# Patient Record
Sex: Male | Born: 1970 | Race: White | Hispanic: No | Marital: Single | Smoking: Former smoker
Health system: Southern US, Community
[De-identification: ages and names within clinical notes are randomized; demographics above are authoritative.]

## PROBLEM LIST (undated history)

## (undated) DIAGNOSIS — M419 Scoliosis, unspecified: Secondary | ICD-10-CM

## (undated) DIAGNOSIS — I2510 Atherosclerotic heart disease of native coronary artery without angina pectoris: Secondary | ICD-10-CM

## (undated) DIAGNOSIS — I639 Cerebral infarction, unspecified: Secondary | ICD-10-CM

## (undated) DIAGNOSIS — Z87891 Personal history of nicotine dependence: Secondary | ICD-10-CM

## (undated) DIAGNOSIS — E119 Type 2 diabetes mellitus without complications: Secondary | ICD-10-CM

## (undated) DIAGNOSIS — E785 Hyperlipidemia, unspecified: Secondary | ICD-10-CM

## (undated) HISTORY — DX: Atherosclerotic heart disease of native coronary artery without angina pectoris: I25.10

## (undated) HISTORY — DX: Personal history of nicotine dependence: Z87.891

## (undated) HISTORY — DX: Cerebral infarction, unspecified: I63.9

## (undated) HISTORY — DX: Morbid (severe) obesity due to excess calories: E66.01

## (undated) HISTORY — DX: Hyperlipidemia, unspecified: E78.5

## (undated) HISTORY — PX: CORONARY ANGIOPLASTY: SHX604

## (undated) HISTORY — PX: ADENOIDECTOMY: SUR15

---

## 2008-01-22 ENCOUNTER — Emergency Department: Payer: Self-pay | Admitting: Emergency Medicine

## 2011-07-12 ENCOUNTER — Inpatient Hospital Stay: Payer: Self-pay | Admitting: Specialist

## 2011-07-12 LAB — PROTIME-INR
INR: 0.9
Prothrombin Time: 12.3 secs (ref 11.5–14.7)

## 2011-07-12 LAB — CBC WITH DIFFERENTIAL/PLATELET
Eosinophil #: 0.4 10*3/uL (ref 0.0–0.7)
HCT: 49.2 % (ref 40.0–52.0)
HGB: 16.1 g/dL (ref 13.0–18.0)
Lymphocyte #: 6.4 10*3/uL — ABNORMAL HIGH (ref 1.0–3.6)
Lymphocyte %: 57.7 %
MCV: 94 fL (ref 80–100)
Monocyte #: 0.6 x10 3/mm (ref 0.2–1.0)
Neutrophil #: 3.6 10*3/uL (ref 1.4–6.5)
Neutrophil %: 32.6 %
RBC: 5.23 10*6/uL (ref 4.40–5.90)
WBC: 11 10*3/uL — ABNORMAL HIGH (ref 3.8–10.6)

## 2011-07-12 LAB — ETHANOL: Ethanol %: 0.174 % — ABNORMAL HIGH (ref 0.000–0.080)

## 2012-12-28 ENCOUNTER — Emergency Department: Payer: Self-pay | Admitting: Emergency Medicine

## 2013-07-23 HISTORY — PX: CARDIAC CATHETERIZATION: SHX172

## 2013-08-10 ENCOUNTER — Inpatient Hospital Stay: Payer: Self-pay | Admitting: Internal Medicine

## 2013-08-10 DIAGNOSIS — I517 Cardiomegaly: Secondary | ICD-10-CM

## 2013-08-10 DIAGNOSIS — R7309 Other abnormal glucose: Secondary | ICD-10-CM

## 2013-08-10 DIAGNOSIS — I214 Non-ST elevation (NSTEMI) myocardial infarction: Secondary | ICD-10-CM

## 2013-08-10 LAB — URINALYSIS, COMPLETE
BILIRUBIN, UR: NEGATIVE
Bacteria: NONE SEEN
Blood: NEGATIVE
Glucose,UR: >=500 mg/dL (ref 0–75)
Ketone: NEGATIVE
Leukocyte Esterase: NEGATIVE
NITRITE: NEGATIVE
Ph: 6 (ref 4.5–8.0)
Protein: NEGATIVE
RBC,UR: NONE SEEN /HPF (ref 0–5)
SPECIFIC GRAVITY: 1.016 (ref 1.003–1.030)
SQUAMOUS EPITHELIAL: NONE SEEN
WBC UR: NONE SEEN /HPF (ref 0–5)

## 2013-08-10 LAB — CBC
HCT: 44.6 % (ref 40.0–52.0)
HGB: 15.4 g/dL (ref 13.0–18.0)
MCH: 30.5 pg (ref 26.0–34.0)
MCHC: 34.5 g/dL (ref 32.0–36.0)
MCV: 88 fL (ref 80–100)
PLATELETS: 260 10*3/uL (ref 150–440)
RBC: 5.05 10*6/uL (ref 4.40–5.90)
RDW: 12.8 % (ref 11.5–14.5)
WBC: 9.5 10*3/uL (ref 3.8–10.6)

## 2013-08-10 LAB — HEPATIC FUNCTION PANEL A (ARMC)
ALBUMIN: 4.2 g/dL (ref 3.4–5.0)
AST: 37 U/L (ref 15–37)
Alkaline Phosphatase: 83 U/L
BILIRUBIN TOTAL: 1.2 mg/dL — AB (ref 0.2–1.0)
Bilirubin, Direct: 0.2 mg/dL (ref 0.00–0.20)
SGPT (ALT): 36 U/L (ref 12–78)
TOTAL PROTEIN: 7.8 g/dL (ref 6.4–8.2)

## 2013-08-10 LAB — TROPONIN I
TROPONIN-I: 1.66 ng/mL — AB
Troponin-I: 5.5 ng/mL — ABNORMAL HIGH
Troponin-I: 15 ng/mL — ABNORMAL HIGH

## 2013-08-10 LAB — CK TOTAL AND CKMB (NOT AT ARMC)
CK, Total: 178 U/L
CK-MB: 7.9 ng/mL — ABNORMAL HIGH (ref 0.5–3.6)

## 2013-08-10 LAB — BASIC METABOLIC PANEL
ANION GAP: 7 (ref 7–16)
BUN: 10 mg/dL (ref 7–18)
CHLORIDE: 101 mmol/L (ref 98–107)
Calcium, Total: 8.9 mg/dL (ref 8.5–10.1)
Co2: 30 mmol/L (ref 21–32)
Creatinine: 1.01 mg/dL (ref 0.60–1.30)
EGFR (Non-African Amer.): >60
Glucose: 174 mg/dL — ABNORMAL HIGH (ref 65–99)
Osmolality: 279 (ref 275–301)
Potassium: 3.3 mmol/L — ABNORMAL LOW (ref 3.5–5.1)
Sodium: 138 mmol/L (ref 136–145)

## 2013-08-10 LAB — CK-MB
CK-MB: 34.4 ng/mL — ABNORMAL HIGH (ref 0.5–3.6)
CK-MB: 65.4 ng/mL — ABNORMAL HIGH (ref 0.5–3.6)

## 2013-08-11 ENCOUNTER — Encounter: Payer: Self-pay | Admitting: Cardiovascular Disease

## 2013-08-11 DIAGNOSIS — I2510 Atherosclerotic heart disease of native coronary artery without angina pectoris: Secondary | ICD-10-CM

## 2013-08-11 LAB — HEMOGLOBIN A1C: Hemoglobin A1C: 7.5 % — ABNORMAL HIGH (ref 4.2–6.3)

## 2013-08-11 LAB — CK TOTAL AND CKMB (NOT AT ARMC)
CK, Total: 268 U/L
CK-MB: 11.7 ng/mL — AB (ref 0.5–3.6)

## 2013-08-11 LAB — BASIC METABOLIC PANEL
Anion Gap: 5 — ABNORMAL LOW (ref 7–16)
BUN: 9 mg/dL (ref 7–18)
CALCIUM: 8.7 mg/dL (ref 8.5–10.1)
CHLORIDE: 104 mmol/L (ref 98–107)
CO2: 28 mmol/L (ref 21–32)
Creatinine: 0.95 mg/dL (ref 0.60–1.30)
EGFR (African American): >60
EGFR (Non-African Amer.): >60
GLUCOSE: 146 mg/dL — AB (ref 65–99)
Osmolality: 275 (ref 275–301)
Potassium: 3.9 mmol/L (ref 3.5–5.1)
SODIUM: 137 mmol/L (ref 136–145)

## 2013-08-11 LAB — LIPID PANEL
CHOLESTEROL: 160 mg/dL (ref 0–200)
HDL Cholesterol: 25 mg/dL — ABNORMAL LOW (ref 40–60)
LDL CHOLESTEROL, CALC: 92 mg/dL (ref 0–100)
Triglycerides: 213 mg/dL — ABNORMAL HIGH (ref 0–200)
VLDL Cholesterol, Calc: 43 mg/dL — ABNORMAL HIGH (ref 5–40)

## 2013-08-12 DIAGNOSIS — E785 Hyperlipidemia, unspecified: Secondary | ICD-10-CM

## 2013-08-12 LAB — BASIC METABOLIC PANEL
ANION GAP: 6 — AB (ref 7–16)
BUN: 9 mg/dL (ref 7–18)
CALCIUM: 8.8 mg/dL (ref 8.5–10.1)
CO2: 26 mmol/L (ref 21–32)
Chloride: 104 mmol/L (ref 98–107)
Creatinine: 0.95 mg/dL (ref 0.60–1.30)
EGFR (Non-African Amer.): >60
Glucose: 139 mg/dL — ABNORMAL HIGH (ref 65–99)
Osmolality: 273 (ref 275–301)
Potassium: 3.8 mmol/L (ref 3.5–5.1)
Sodium: 136 mmol/L (ref 136–145)

## 2013-08-13 ENCOUNTER — Telehealth: Payer: Self-pay

## 2013-08-13 NOTE — Telephone Encounter (Signed)
Patient contacted regarding discharge from [facility] on 08/12/13.  Patient understands to follow up with provider Ignacia Bayley, NP on 08/18/13 at 2:15 at [facility]. Patient understands discharge instructions? yes Patient understands medications and regiment? yes Patient understands to bring all medications to this visit? yes  Pt would like a work note to return to work on Sunday. Will leave at front desk for him to pick up today.

## 2013-08-17 ENCOUNTER — Encounter: Payer: Self-pay | Admitting: *Deleted

## 2013-08-18 ENCOUNTER — Encounter (INDEPENDENT_AMBULATORY_CARE_PROVIDER_SITE_OTHER): Payer: Self-pay

## 2013-08-18 ENCOUNTER — Encounter: Payer: Self-pay | Admitting: Nurse Practitioner

## 2013-08-18 ENCOUNTER — Ambulatory Visit (INDEPENDENT_AMBULATORY_CARE_PROVIDER_SITE_OTHER): Payer: PRIVATE HEALTH INSURANCE | Admitting: Nurse Practitioner

## 2013-08-18 VITALS — BP 130/82 | HR 78 | Ht 67.0 in | Wt 213.5 lb

## 2013-08-18 DIAGNOSIS — R079 Chest pain, unspecified: Secondary | ICD-10-CM

## 2013-08-18 DIAGNOSIS — Z87891 Personal history of nicotine dependence: Secondary | ICD-10-CM | POA: Insufficient documentation

## 2013-08-18 DIAGNOSIS — E785 Hyperlipidemia, unspecified: Secondary | ICD-10-CM | POA: Insufficient documentation

## 2013-08-18 DIAGNOSIS — I2510 Atherosclerotic heart disease of native coronary artery without angina pectoris: Secondary | ICD-10-CM | POA: Insufficient documentation

## 2013-08-18 DIAGNOSIS — I10 Essential (primary) hypertension: Secondary | ICD-10-CM

## 2013-08-18 DIAGNOSIS — I214 Non-ST elevation (NSTEMI) myocardial infarction: Secondary | ICD-10-CM

## 2013-08-18 DIAGNOSIS — E119 Type 2 diabetes mellitus without complications: Secondary | ICD-10-CM

## 2013-08-18 MED ORDER — METOPROLOL SUCCINATE ER 50 MG PO TB24
50.0000 mg | ORAL_TABLET | Freq: Every day | ORAL | Status: DC
Start: 2013-08-18 — End: 2017-12-30

## 2013-08-18 NOTE — Patient Instructions (Signed)
Your physician has recommended you make the following change in your medication:  Stop Furosamide Increase Toprol to 50 mg daily    Your physician recommends that you return for lab work in:  Fasting labs (Lipid and Liver Profile) in 6 weeks   Your physician recommends that you schedule a follow-up appointment in:  3 months with Dr. Rockey Situ

## 2013-08-18 NOTE — Progress Notes (Signed)
Patient Name: Christian [name] Date of Encounter: 08/18/2013  Primary Care Provider:  Lavera Guise, MD Primary Cardiologist:  Johnny Bridge, MD   Patient Profile  43 y/o male with recent admission for NSTEMI and DES to the LCX, who presents for f/u.  Problem List   Past Medical History  Diagnosis Date  . CAD (coronary artery disease)     a. 07/2013 NSTEMI/PCI: LCX 30m (4.0x23 Xience DES), RCA 3m, EF > 55%;  b. 07/2013 Echo: EF 60-65%, diast dysfxn, mild LVH, mildly dil LA, nl RV size/fxn, nl RVSP.  Marland Kitchen HTN (hypertension)   . Hyperlipidemia   . Diabetes mellitus, type II   . Morbid obesity     a. weighed 168 @ age 9.  Marland Kitchen History of tobacco abuse     a. quit ~ 2010   Past Surgical History  Procedure Laterality Date  . Adenoidectomy    . Cardiac catheterization  07/2013    [facility]'x1 stent    Allergies  No Known Allergies  HPI  43 y/o male with the above problem list.  He was admitted to [facility] last week after developing stuttering c/p that began after trying to change a light bulb in his oven resulting in him getting shocked/electrocuted.  Pain worsened and he presented to the [facility] ED.  There, ECG was notable for inf q's and lateral twi.  Troponin was elevated.  Pain resolved with ntg.  He was admitted and seen by cardiology.  Echo showed nl LV fxn.  He underwent cath showing severe LCX dzs and this was successfully stented using a xience DES.  He was d/c'd home on asa, plavix, bb, and statin therapy.  Lasix was added on d/c b/c of diast dysfxn on echo, but as far as I can tell, he was never felt to be volume overloaded on exam.  Since d/c, he has been doing well.  He denies chest pain, palpitations, dyspnea, pnd, orthopnea, n, v, dizziness, syncope, edema, weight gain, or early satiety.  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  He only drives and otw does not have to do any heavy lifting or exertion.  He only drives locally and is home each night.  He has been tolerating his meds well  and reports compliance.  Home Medications  Prior to Admission medications   Medication Sig Start Date End Date Taking? Authorizing Provider  aspirin EC 81 MG tablet Take 81 mg by mouth daily.   Yes Historical Provider, MD  clopidogrel (PLAVIX) 75 MG tablet Take 75 mg by mouth daily with breakfast.   Yes Historical Provider, MD  metFORMIN (GLUCOPHAGE) 500 MG tablet Take 500 mg by mouth 2 (two) times daily with a meal.   Yes Historical Provider, MD  simvastatin (ZOCOR) 10 MG tablet Take 10 mg by mouth daily.   Yes Historical Provider, MD  metoprolol succinate (TOPROL-XL) 50 MG 24 hr tablet Take 1 tablet (50 mg total) by mouth daily. 08/18/13   Rogelia Mire, NP    Review of Systems  He denies chest pain, palpitations, dyspnea, pnd, orthopnea, n, v, dizziness, syncope, edema, weight gain, or early satiety.  All other systems reviewed and are otherwise negative except as noted above.  Physical Exam  Blood pressure 130/82, pulse 78, height 5\' 7"  (1.702 m), weight 213 lb 8 oz (96.843 kg).  General: Pleasant, NAD Psych: Normal affect. Neuro: Alert and oriented X 3. Moves all extremities spontaneously. HEENT: Normal  Neck: Supple without bruits  or JVD. Lungs:  Resp regular and unlabored, CTA. Heart: RRR no s3, s4, or murmurs. Abdomen: Soft, non-tender, non-distended, BS + x 4.  Extremities: No clubbing, cyanosis or edema. DP/PT/Radials 2+ and equal bilaterally.  R groin site ecchymotic w/o bleeding/bruit/hematoma.  Accessory Clinical Findings  ECG - RSR, 78, inf q's, twi I/aVL - no acute st/t changes.  Assessment & Plan  1.  NSTEMI subsequent episode of care/CAD:  S/p PCI/DES to the LCX last week.  He has residual non-obstructive RCA dzs.  He reports compliance with meds and is tolerating them well.  He has not had any recurrent c/p or dyspnea.  Cont asa, bb, statin, plavix.  As he has nl LV fxn and did not have any evidence of volume overload in the hospital, I will d/c his lasix.   In so doing, I will titrate toprol xl to 50mg  daily.  As above, he has already returned to work.  We discussed that ideally following a NSTEMI, he should be out for 2-4 wks.  He does not think that he could afford to be out that amount of time and says that he does not exert himself @ work.  We also discussed the role of cardiac rehab and weight loss.  He will consider cardiac rehab and is hoping to lose some weight.  He plans to begin walking daily and we discussed what is an appropriate level of activity @ this time.  He says that he weighed 168 lbs when he was in the service and we set this as a 2 yr goal for him.  2.  HTN:  Stable.  As above, d/c'in lasix.  Will increase toprol to 50.  3.  HL:  Cont statin therapy.  LFT's nl during hospitalization.  We will f/u lipids/lft's in 6 wks.  4.  DM II:  Now on metformin and tolerating well.  He has primary care f/u next week with Dr. Stephanie Coup.  5.  Morbid Obesity:  See # 1 re: exercise and wt loss discussion.  He will consider outpt cardiac rehab.  6.  Dispo:  F/U lipids/lft's in 6 wks.  F/U with Dr. Rockey Situ in 3 mos.   Rogelia Mire, NP 08/18/2013, 3:44 PM

## 2013-09-06 ENCOUNTER — Ambulatory Visit: Payer: Self-pay | Admitting: Internal Medicine

## 2013-09-10 DIAGNOSIS — E785 Hyperlipidemia, unspecified: Secondary | ICD-10-CM

## 2013-09-10 DIAGNOSIS — E119 Type 2 diabetes mellitus without complications: Secondary | ICD-10-CM

## 2013-09-10 DIAGNOSIS — I214 Non-ST elevation (NSTEMI) myocardial infarction: Secondary | ICD-10-CM

## 2013-09-10 DIAGNOSIS — I10 Essential (primary) hypertension: Secondary | ICD-10-CM

## 2013-09-10 DIAGNOSIS — I2510 Atherosclerotic heart disease of native coronary artery without angina pectoris: Secondary | ICD-10-CM

## 2013-09-22 ENCOUNTER — Ambulatory Visit: Payer: Self-pay | Admitting: Internal Medicine

## 2013-09-30 ENCOUNTER — Ambulatory Visit (INDEPENDENT_AMBULATORY_CARE_PROVIDER_SITE_OTHER): Payer: PRIVATE HEALTH INSURANCE | Admitting: *Deleted

## 2013-09-30 DIAGNOSIS — E785 Hyperlipidemia, unspecified: Secondary | ICD-10-CM

## 2013-10-01 ENCOUNTER — Other Ambulatory Visit: Payer: Self-pay

## 2013-10-01 DIAGNOSIS — E785 Hyperlipidemia, unspecified: Secondary | ICD-10-CM

## 2013-10-01 LAB — LIPID PANEL
CHOLESTEROL TOTAL: 180 mg/dL (ref 100–199)
Chol/HDL Ratio: 5.1 ratio units — ABNORMAL HIGH (ref 0.0–5.0)
HDL: 35 mg/dL — ABNORMAL LOW (ref >39)
LDL Calculated: 117 mg/dL — ABNORMAL HIGH (ref 0–99)
TRIGLYCERIDES: 140 mg/dL (ref 0–149)
VLDL Cholesterol Cal: 28 mg/dL (ref 5–40)

## 2013-10-01 LAB — HEPATIC FUNCTION PANEL
ALK PHOS: 75 IU/L (ref 39–117)
ALT: 23 IU/L (ref 0–44)
AST: 18 IU/L (ref 0–40)
Albumin: 4.9 g/dL (ref 3.5–5.5)
Bilirubin, Direct: 0.21 mg/dL (ref 0.00–0.40)
TOTAL PROTEIN: 7.3 g/dL (ref 6.0–8.5)
Total Bilirubin: 0.8 mg/dL (ref 0.0–1.2)

## 2013-10-01 MED ORDER — ATORVASTATIN CALCIUM 80 MG PO TABS: 80.0000 mg | ORAL_TABLET | Freq: Every day | ORAL | Status: DC

## 2013-10-23 ENCOUNTER — Ambulatory Visit: Payer: Self-pay | Admitting: Internal Medicine

## 2013-10-28 ENCOUNTER — Other Ambulatory Visit: Payer: Self-pay | Admitting: *Deleted

## 2013-10-28 MED ORDER — CLOPIDOGREL BISULFATE 75 MG PO TABS: 75.0000 mg | ORAL_TABLET | Freq: Every day | ORAL | Status: DC

## 2013-10-29 ENCOUNTER — Other Ambulatory Visit: Payer: Self-pay

## 2013-10-29 MED ORDER — CLOPIDOGREL BISULFATE 75 MG PO TABS: 75.0000 mg | ORAL_TABLET | Freq: Every day | ORAL | Status: DC

## 2013-10-29 NOTE — Telephone Encounter (Signed)
Refill sent for plavix  

## 2013-10-29 NOTE — Addendum Note (Signed)
Addended by: Anselm Pancoast on: 10/29/2013 12:19 PM   Modules accepted: Orders

## 2013-11-18 ENCOUNTER — Ambulatory Visit: Payer: PRIVATE HEALTH INSURANCE | Admitting: Cardiovascular Disease

## 2013-11-23 ENCOUNTER — Ambulatory Visit: Payer: Self-pay | Admitting: Internal Medicine

## 2013-12-03 ENCOUNTER — Ambulatory Visit: Payer: PRIVATE HEALTH INSURANCE | Admitting: Cardiovascular Disease

## 2013-12-08 ENCOUNTER — Telehealth: Payer: Self-pay

## 2013-12-08 ENCOUNTER — Encounter: Payer: Self-pay | Admitting: Nurse Practitioner

## 2013-12-08 NOTE — Telephone Encounter (Signed)
Letter left at front desk for pt to pick up at his convenience.

## 2013-12-08 NOTE — Telephone Encounter (Signed)
1. What type of work will he be doing?  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  2.  What is the dental procedure?  He may not come off of asa/plavix but more than likely would be ok to have routine dental procedures.

## 2013-12-08 NOTE — Telephone Encounter (Signed)
Pt needs clearance for dental work. Please call. Also needs a letter stating it is ok for him to work . Pt is starting new job.

## 2013-12-08 NOTE — Telephone Encounter (Signed)
Spoke w/ pt.  He states that his company is [employer] and he has been reassigned. 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  He has been cleared by PCP, as his HgbA1c is now 6.3 after losing 18lbs. He would like to pick a letter tomorrow stating that he is cleared to do this type of work w/ his stent.   Pt reports that he is having his teeth cleaned.   Advised him of Chris's recommendation and he verbalizes understanding.

## 2013-12-15 ENCOUNTER — Ambulatory Visit (INDEPENDENT_AMBULATORY_CARE_PROVIDER_SITE_OTHER): Payer: PRIVATE HEALTH INSURANCE | Admitting: Physician Assistant

## 2013-12-15 ENCOUNTER — Encounter: Payer: Self-pay | Admitting: Physician Assistant

## 2013-12-15 VITALS — BP 120/84 | HR 72 | Ht 67.0 in | Wt 199.8 lb

## 2013-12-15 DIAGNOSIS — I252 Old myocardial infarction: Secondary | ICD-10-CM

## 2013-12-15 DIAGNOSIS — Z7689 Persons encountering health services in other specified circumstances: Secondary | ICD-10-CM

## 2013-12-15 DIAGNOSIS — Z0189 Encounter for other specified special examinations: Secondary | ICD-10-CM

## 2013-12-15 DIAGNOSIS — Z9889 Other specified postprocedural states: Secondary | ICD-10-CM

## 2013-12-15 DIAGNOSIS — I10 Essential (primary) hypertension: Secondary | ICD-10-CM

## 2013-12-15 NOTE — Patient Instructions (Signed)
[employer]  Your caregiver has ordered a Stress Test with nuclear imaging. The purpose of this test is to evaluate the blood supply to your heart muscle. This procedure is referred to as a "Non-Invasive Stress Test." This is because other than having an IV started in your vein, nothing is inserted or "invades" your body. Cardiac stress tests are done to find areas of poor blood flow to the heart by determining the extent of coronary artery disease (CAD). Some patients exercise on a treadmill, which naturally increases the blood flow to your heart, while others who are  unable to walk on a treadmill due to physical limitations have a pharmacologic/chemical stress agent called Lexiscan . This medicine will mimic walking on a treadmill by temporarily increasing your coronary blood flow.   Please note: these test may take anywhere between 2-4 hours to complete  PLEASE REPORT TO [facility] AT THE FIRST DESK WILL DIRECT YOU WHERE TO GO  Date of Procedure:________9/28/15_____________________________  Arrival Time for Procedure:_______0745 am_______________________  Instructions regarding medication:   _x___ : Hold diabetes medication morning of procedure  _x___:  Hold betablocker(s) night before procedure and morning of procedure:            Metoprolol    PLEASE NOTIFY THE OFFICE AT LEAST 24 HOURS IN ADVANCE IF YOU ARE UNABLE TO KEEP YOUR APPOINTMENT.  [phone] AND  PLEASE NOTIFY NUCLEAR MEDICINE AT [facility] AT LEAST 24 HOURS IN ADVANCE IF YOU ARE UNABLE TO KEEP YOUR APPOINTMENT. [phone]  How to prepare for your Myoview test:  1. Do not eat or drink after midnight 2. No caffeine for 24 hours prior to test 3. No smoking 24 hours prior to test. 4. Your medication may be taken with water.  If your doctor stopped a medication because of this test, do not take that medication. 5. Ladies, please do not wear dresses.  Skirts or pants are appropriate. Please  wear a short sleeve shirt. 6. No perfume, cologne or lotion. 7. Wear comfortable walking shoes. No heels!  Your physician has requested that you have an echocardiogram. Echocardiography is a painless test that uses sound waves to create images of your heart. It provides your doctor with information about the size and shape of your heart and how well your heart's chambers and valves are working. This procedure takes approximately one hour. There are no restrictions for this procedure.  Your physician recommends that you return for lab work in:  Liver Panel   Your physician wants you to follow-up in: 6 months. You will receive a reminder letter in the mail two months in advance. If you don't receive a letter, please call our office to schedule the follow-up appointment.

## 2013-12-15 NOTE — Progress Notes (Signed)
Patient Name: Christian [name], Christian [name] [birth year], MRN [id number]  Date of Encounter: 12/15/2013  Primary Care Provider:  Lavera Guise, MD Primary Cardiologist:  Dr. Rockey Situ, MD  Patient Profile:  43 y.o. male with history of CAD s/p PCI/DES to LCx (07/2013), HTN, HLD, DM2, morbid obesity, & h/o tobacco abuse who presents for routine office follow up and is doing well.     Problem List:   Past Medical History  Diagnosis Date  . CAD (coronary artery disease)     a. 07/2013 NSTEMI/PCI: LCX 44m (4.0x23 Xience DES), RCA 6m, EF > 55%;  b. 07/2013 Echo: EF 60-65%, diast dysfxn, mild LVH, mildly dil LA, nl RV size/fxn, nl RVSP.  Marland Kitchen HTN (hypertension)   . Hyperlipidemia   . Diabetes mellitus, type II   . Morbid obesity     a. weighed 168 @ age 64.  Marland Kitchen History of tobacco abuse     a. quit ~ 2010   Past Surgical History  Procedure Laterality Date  . Adenoidectomy    . Cardiac catheterization  07/2013    [facility]'x1 stent     Allergies:  No Known Allergies   HPI:  43 y.o. male with the above problem list here for routine follow who is doing well.   Patient was admitted to [facility] May 2015 with stuttering chest pain that began after he was changing a light bulb in his oven resulting in him getting shocked/electrocuted. His pain continued to worsened prompting him to present to [facility] ED. At that time EKG was notable for inferior Q waves and lateral TWI. Troponin was elevated. Nl LV fxn by echo. Cardiac cath revealed severe LCx disease which was successfully treated using Xience DES. He was discharged on aspirin, bb, & statin. Lasix was 2/2 diastolic dysfunction; however this was discontinued at his post hospital follow up on 08/18/13 as he did not feel volume over loaded, nor did he appear to be volume over loaded. To compensate for the discontinuation of the Lasix his bb was titrated.    Since his last OV he reports he is doing well. He is taking his medications as directed and tolerating them  without issues. He was previously walking the track daily for at least 30 minutes for exercise but had to decrease this to 2-3 times per week as his mother had to go to PT and he was needed for that. His mother's PT has since ended and he plans to start back his track exercise program again and work up to daily. He is trying to eat healthy. His last Hgb A1C was 6.3% on 12/03/13 which is down from 7.5% previously. He denies any chest pain, palpitations, diaphoresis, nausea, vomiting, SOB, DOE, presyncope, or syncope.   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 Because of this he will need to have an ETT Myvoew and a post MI echo per [facility] guidelines before his new DOT card can be issued.    Home Medications:  Prior to Admission medications   Medication Sig Start Date End Date Taking? Authorizing Provider  aspirin EC 81 MG tablet Take 81 mg by mouth daily.   Yes Historical Provider, MD  atorvastatin (LIPITOR) 80 MG tablet Take 1 tablet (80 mg total) by mouth daily. 10/01/13  Yes Rogelia Mire, NP  clopidogrel (PLAVIX) 75 MG tablet Take 1 tablet (75 mg total) by mouth daily with breakfast. 10/29/13  Yes Minna Merritts, MD  metFORMIN (GLUCOPHAGE)  500 MG tablet Take 500 mg by mouth 2 (two) times daily with a meal.   Yes Historical Provider, MD  metoprolol succinate (TOPROL-XL) 50 MG 24 hr tablet Take 1 tablet (50 mg total) by mouth daily. 08/18/13  Yes Rogelia Mire, NP     Weights: Filed Weights   12/15/13 1415  Weight: 199 lb 12 oz (90.606 kg)     Review of Systems:  All other systems reviewed and are otherwise negative except as noted above.  Physical Exam:  Blood pressure 120/84, pulse 72, height 5\' 7"  (1.702 m), weight 199 lb 12 oz (90.606 kg).  General: Pleasant, NAD Psych: Normal affect. Neuro: Alert and oriented X 3. Moves all extremities spontaneously. HEENT: Normal  Neck: Supple without bruits or JVD. Lungs:  Resp regular and unlabored, CTA. Heart:  RRR no s3, s4, or murmurs. Abdomen: Soft, non-tender, non-distended, BS + x 4.  Extremities: No clubbing, cyanosis or edema. DP/PT/Radials 2+ and equal bilaterally.   Accessory Clinical Findings:  EKG - NSR, 72, inferior Q waves, TWI in I an aVL has improved  Assessment & Plan:  1. CAD s/p PCI/DES to LCx: -Doing well - asymptomatic  -Continue aspirin 81 mg daily and Plavix 75 mg daily at least 12 months -Needs ETT Myoview and echo for new DOT card. Per new [employer] guidelines: 1) He must be asymptomatic, 2) Adequate ETT Myoview, 3) echo with EF of at least 40%, 4) repeat ETT Myoview every 2 years, 5) DOT card will be valid for 1 year   -Continue Toprol XL 50 mg and Lipitor 80 mg  2. HTN: -Controlled -Continue Toprol XL 50 mg daily   3. HLD: -Now on Lipitor 80 mg as of 09/30/13 -Goal LDL <70 -Check LFTs   4. DM2: -A1C improved from 7.5% to 6.3% -Followed by PCP  5. Morbid obesity: -Healthy diet and exercise  6. Dispo: -Follow up LFTs -Follow up ETT Myoview - for DOT card -Follow up echo - for DOT card -Follow up with Dr. Rockey Situ 6 months  Christell Faith, PA-C [place], [place] [address] [phone] [facility] 12/15/2013, 4:00 PM

## 2013-12-16 ENCOUNTER — Other Ambulatory Visit: Payer: Self-pay | Admitting: Physician Assistant

## 2013-12-16 DIAGNOSIS — E785 Hyperlipidemia, unspecified: Secondary | ICD-10-CM

## 2013-12-16 LAB — HEPATIC FUNCTION PANEL
ALK PHOS: 95 IU/L (ref 39–117)
ALT: 27 IU/L (ref 0–44)
AST: 19 IU/L (ref 0–40)
Albumin: 4.7 g/dL (ref 3.5–5.5)
BILIRUBIN DIRECT: 0.32 mg/dL (ref 0.00–0.40)
Total Bilirubin: 1.2 mg/dL (ref 0.0–1.2)
Total Protein: 7 g/dL (ref 6.0–8.5)

## 2013-12-17 ENCOUNTER — Telehealth: Payer: Self-pay

## 2013-12-17 DIAGNOSIS — I214 Non-ST elevation (NSTEMI) myocardial infarction: Secondary | ICD-10-CM

## 2013-12-17 NOTE — Telephone Encounter (Signed)
Spoke w/ pt.  Advised him that ins would not cover his Brantley Fling that is sched for Monday and that I have cancelled this.  Pt is now sched for GXT 12/23/13 @ 2:00 followed by an ECHO @ 2:30.  He verbalizes understanding and is agreeable.

## 2013-12-17 NOTE — Progress Notes (Signed)
No answer no vm 9/25

## 2013-12-23 ENCOUNTER — Ambulatory Visit (INDEPENDENT_AMBULATORY_CARE_PROVIDER_SITE_OTHER): Payer: PRIVATE HEALTH INSURANCE | Admitting: Physician Assistant

## 2013-12-23 ENCOUNTER — Ambulatory Visit: Payer: Self-pay | Admitting: Internal Medicine

## 2013-12-23 ENCOUNTER — Ambulatory Visit (INDEPENDENT_AMBULATORY_CARE_PROVIDER_SITE_OTHER): Payer: PRIVATE HEALTH INSURANCE | Admitting: *Deleted

## 2013-12-23 VITALS — BP 127/86 | HR 73 | Ht 67.0 in | Wt 199.8 lb

## 2013-12-23 DIAGNOSIS — Z9889 Other specified postprocedural states: Secondary | ICD-10-CM

## 2013-12-23 DIAGNOSIS — Z7689 Persons encountering health services in other specified circumstances: Secondary | ICD-10-CM

## 2013-12-23 DIAGNOSIS — I252 Old myocardial infarction: Secondary | ICD-10-CM

## 2013-12-23 DIAGNOSIS — I214 Non-ST elevation (NSTEMI) myocardial infarction: Secondary | ICD-10-CM

## 2013-12-23 DIAGNOSIS — I10 Essential (primary) hypertension: Secondary | ICD-10-CM

## 2013-12-23 NOTE — Progress Notes (Signed)
    Patient came in for treadmill stress test and tolerated well without any symptoms.  Resting BP 127/86, pulse 73 Max HR: 166, 94% MPBPM Max BP 158/74 11.70 METS  No significant EKG changes of ischemia.   EKG reviewed with Dr. Rockey Situ, MD as well.    Christell Faith, PA-C 12/23/2013 4:40 PM

## 2013-12-23 NOTE — Procedures (Signed)
Exercise Treadmill Test  Pre-Exercise Testing Evaluation Rhythm: normal sinus  Rate: 73  PR:   QRS:    QT:   QTc:   P axis:   QRS axis:    ST Segments:  no significant ST changes at rest     Test  Exercise Tolerance Test Ordering MD: Christell Faith, PA-C  Interpreting MD: Christell Faith, PA-C  Unique Test No:  Treadmill:  1  Indication for ETT: post angioplasty  Contraindication to ETT: No   Stress Modality: exercise - treadmill  Cardiac Imaging Performed: non   Protocol: standard Bruce - maximal  Max BP:  158/74  Max MPHR (bpm):  94% 85% MPR (bpm):  176  MPHR obtained (bpm):  166 % MPHR obtained:  94%  Reached 85% MPHR (min:sec):  8:50 Total Exercise Time (min-sec):  10:01  Workload in METS:  11.70 Borg Scale:   Reason ETT Terminated:  fatigue    ST Segment Analysis At Rest: normal ST segments - no evidence of significant ST depression With Exercise: no evidence of significant ST depression  Other Information Arrhythmia:  No Angina during ETT:  absent (0) Quality of ETT:  diagnostic  ETT Interpretation:  normal - no evidence of ischemia by ST analysis  Comments: No evidence of significant EKG changes concerning for ischemia.    Recommendations: Continue aggressive medical therapy, lifestyle modification, and risk factor reduction.

## 2013-12-23 NOTE — Progress Notes (Signed)
    Patient tolerated treadmill stress test well and did not develop any anginal symptoms.   Resting BP 127/86 Resting pulse 75  Max HR 166 %MPBPM 94% Max BP 158/74 11.70 METS  No significant EKG changes concerning for ischemia.    EKG reviewed by Dr. Rockey Situ, MD as well.    Christell Faith, PA-C  12/23/2013 4:46 PM

## 2013-12-23 NOTE — Patient Instructions (Signed)
Your stress test was normal.  Call or return to clinic prn if these symptoms worsen or fail to improve as anticipated.

## 2013-12-24 ENCOUNTER — Telehealth: Payer: Self-pay | Admitting: Physician Assistant

## 2013-12-24 NOTE — Telephone Encounter (Signed)
See patient letter
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
## 2017-12-30 ENCOUNTER — Other Ambulatory Visit: Payer: Self-pay

## 2017-12-30 ENCOUNTER — Observation Stay
Admission: EM | Admit: 2017-12-30 | Discharge: 2018-01-01 | Disposition: A | Discharge status: Home / Self Care | Payer: Self-pay | Attending: Internal Medicine | Admitting: Internal Medicine

## 2017-12-30 ENCOUNTER — Emergency Department: Payer: Self-pay

## 2017-12-30 DIAGNOSIS — R000 Tachycardia, unspecified: Secondary | ICD-10-CM | POA: Insufficient documentation

## 2017-12-30 DIAGNOSIS — K760 Fatty (change of) liver, not elsewhere classified: Secondary | ICD-10-CM | POA: Insufficient documentation

## 2017-12-30 DIAGNOSIS — Z7982 Long term (current) use of aspirin: Secondary | ICD-10-CM | POA: Insufficient documentation

## 2017-12-30 DIAGNOSIS — I208 Other forms of angina pectoris: Secondary | ICD-10-CM

## 2017-12-30 DIAGNOSIS — I2510 Atherosclerotic heart disease of native coronary artery without angina pectoris: Secondary | ICD-10-CM | POA: Insufficient documentation

## 2017-12-30 DIAGNOSIS — R0789 Other chest pain: Principal | ICD-10-CM | POA: Insufficient documentation

## 2017-12-30 DIAGNOSIS — Z9114 Patient's other noncompliance with medication regimen: Secondary | ICD-10-CM | POA: Insufficient documentation

## 2017-12-30 DIAGNOSIS — Z87891 Personal history of nicotine dependence: Secondary | ICD-10-CM | POA: Insufficient documentation

## 2017-12-30 DIAGNOSIS — I160 Hypertensive urgency: Secondary | ICD-10-CM | POA: Insufficient documentation

## 2017-12-30 DIAGNOSIS — E1165 Type 2 diabetes mellitus with hyperglycemia: Secondary | ICD-10-CM | POA: Insufficient documentation

## 2017-12-30 DIAGNOSIS — Z8249 Family history of ischemic heart disease and other diseases of the circulatory system: Secondary | ICD-10-CM | POA: Insufficient documentation

## 2017-12-30 DIAGNOSIS — N281 Cyst of kidney, acquired: Secondary | ICD-10-CM | POA: Insufficient documentation

## 2017-12-30 DIAGNOSIS — Z7984 Long term (current) use of oral hypoglycemic drugs: Secondary | ICD-10-CM | POA: Insufficient documentation

## 2017-12-30 DIAGNOSIS — I10 Essential (primary) hypertension: Secondary | ICD-10-CM | POA: Insufficient documentation

## 2017-12-30 DIAGNOSIS — Z79899 Other long term (current) drug therapy: Secondary | ICD-10-CM | POA: Insufficient documentation

## 2017-12-30 DIAGNOSIS — E7800 Pure hypercholesterolemia, unspecified: Secondary | ICD-10-CM | POA: Insufficient documentation

## 2017-12-30 DIAGNOSIS — Z955 Presence of coronary angioplasty implant and graft: Secondary | ICD-10-CM | POA: Insufficient documentation

## 2017-12-30 DIAGNOSIS — K4090 Unilateral inguinal hernia, without obstruction or gangrene, not specified as recurrent: Secondary | ICD-10-CM | POA: Insufficient documentation

## 2017-12-30 DIAGNOSIS — E785 Hyperlipidemia, unspecified: Secondary | ICD-10-CM | POA: Insufficient documentation

## 2017-12-30 DIAGNOSIS — I252 Old myocardial infarction: Secondary | ICD-10-CM | POA: Insufficient documentation

## 2017-12-30 DIAGNOSIS — F1010 Alcohol abuse, uncomplicated: Secondary | ICD-10-CM | POA: Insufficient documentation

## 2017-12-30 DIAGNOSIS — E876 Hypokalemia: Secondary | ICD-10-CM | POA: Insufficient documentation

## 2017-12-30 DIAGNOSIS — R079 Chest pain, unspecified: Secondary | ICD-10-CM

## 2017-12-30 LAB — COMPREHENSIVE METABOLIC PANEL
ALBUMIN: 4.3 g/dL (ref 3.5–5.0)
ALT: 16 U/L (ref 0–44)
AST: 15 U/L (ref 15–41)
Alkaline Phosphatase: 62 U/L (ref 38–126)
Anion gap: 12 (ref 5–15)
BUN: 6 mg/dL (ref 6–20)
CHLORIDE: 93 mmol/L — AB (ref 98–111)
CO2: 30 mmol/L (ref 22–32)
Calcium: 9.2 mg/dL (ref 8.9–10.3)
Creatinine, Ser: 0.6 mg/dL — ABNORMAL LOW (ref 0.61–1.24)
GFR calc non Af Amer: >60 mL/min (ref >60)
GLUCOSE: 285 mg/dL — AB (ref 70–99)
POTASSIUM: 3.4 mmol/L — AB (ref 3.5–5.1)
SODIUM: 135 mmol/L (ref 135–145)
Total Bilirubin: 1.4 mg/dL — ABNORMAL HIGH (ref 0.3–1.2)
Total Protein: 7.3 g/dL (ref 6.5–8.1)

## 2017-12-30 LAB — APTT: APTT: 29 s (ref 24–36)

## 2017-12-30 LAB — CBC
HCT: 42.3 % (ref 40.0–52.0)
HEMOGLOBIN: 15.1 g/dL (ref 13.0–18.0)
MCH: 33.1 pg (ref 26.0–34.0)
MCHC: 35.6 g/dL (ref 32.0–36.0)
MCV: 92.9 fL (ref 80.0–100.0)
PLATELETS: 193 10*3/uL (ref 150–440)
RBC: 4.56 MIL/uL (ref 4.40–5.90)
RDW: 13 % (ref 11.5–14.5)
WBC: 4.3 10*3/uL (ref 3.8–10.6)

## 2017-12-30 LAB — TROPONIN I
TROPONIN I: 0.03 ng/mL — AB (ref <0.03)
TROPONIN I: 0.03 ng/mL — AB (ref <0.03)
Troponin I: <0.03 ng/mL (ref <0.03)

## 2017-12-30 LAB — HEMOGLOBIN A1C
Hgb A1c MFr Bld: 10 % — ABNORMAL HIGH (ref 4.8–5.6)
Mean Plasma Glucose: 240.3 mg/dL

## 2017-12-30 LAB — GLUCOSE, CAPILLARY
Glucose-Capillary: 269 mg/dL — ABNORMAL HIGH (ref 70–99)
Glucose-Capillary: 284 mg/dL — ABNORMAL HIGH (ref 70–99)

## 2017-12-30 LAB — PROTIME-INR
INR: 0.9
PROTHROMBIN TIME: 12.1 s (ref 11.4–15.2)

## 2017-12-30 MED ORDER — ACETAMINOPHEN 325 MG PO TABS: 650.0000 mg | ORAL_TABLET | Freq: Four times a day (QID) | ORAL | Status: DC | PRN

## 2017-12-30 MED ORDER — ASPIRIN 81 MG PO CHEW
81.0000 mg | CHEWABLE_TABLET | Freq: Every day | ORAL | Status: DC
  Administered 2017-12-30 – 2018-01-01 (×3): 81 mg via ORAL
  Filled 2017-12-30 (×3): qty 1

## 2017-12-30 MED ORDER — SODIUM CHLORIDE 0.9 % IV SOLN
INTRAVENOUS | Status: DC
  Administered 2017-12-30 – 2017-12-31 (×2): via INTRAVENOUS

## 2017-12-30 MED ORDER — METOPROLOL TARTRATE 50 MG PO TABS
50.0000 mg | ORAL_TABLET | Freq: Two times a day (BID) | ORAL | Status: DC
  Administered 2017-12-30 (×2): 50 mg via ORAL
  Filled 2017-12-30 (×2): qty 1

## 2017-12-30 MED ORDER — NITROGLYCERIN 2 % TD OINT
0.5000 [in_us] | TOPICAL_OINTMENT | Freq: Four times a day (QID) | TRANSDERMAL | Status: DC
  Administered 2017-12-30 – 2018-01-01 (×8): 0.5 [in_us] via TOPICAL
  Filled 2017-12-30 (×7): qty 1

## 2017-12-30 MED ORDER — BISACODYL 5 MG PO TBEC
5.0000 mg | DELAYED_RELEASE_TABLET | Freq: Every day | ORAL | Status: DC | PRN
  Filled 2017-12-30: qty 1

## 2017-12-30 MED ORDER — ACETAMINOPHEN 650 MG RE SUPP: 650.0000 mg | Freq: Four times a day (QID) | RECTAL | Status: DC | PRN

## 2017-12-30 MED ORDER — IOPAMIDOL (ISOVUE-370) INJECTION 76%
100.0000 mL | Freq: Once | INTRAVENOUS | Status: AC | PRN
  Administered 2017-12-30: 100 mL via INTRAVENOUS

## 2017-12-30 MED ORDER — ONDANSETRON HCL 4 MG/2ML IJ SOLN: 4.0000 mg | Freq: Four times a day (QID) | INTRAMUSCULAR | Status: DC | PRN

## 2017-12-30 MED ORDER — ONDANSETRON HCL 4 MG PO TABS: 4.0000 mg | ORAL_TABLET | Freq: Four times a day (QID) | ORAL | Status: DC | PRN

## 2017-12-30 MED ORDER — HYDRALAZINE HCL 20 MG/ML IJ SOLN: 10.0000 mg | Freq: Four times a day (QID) | INTRAMUSCULAR | Status: DC | PRN

## 2017-12-30 MED ORDER — ROSUVASTATIN CALCIUM 10 MG PO TABS
10.0000 mg | ORAL_TABLET | Freq: Every day | ORAL | Status: DC
  Administered 2017-12-30 – 2018-01-01 (×3): 10 mg via ORAL
  Filled 2017-12-30 (×3): qty 1

## 2017-12-30 MED ORDER — ENOXAPARIN SODIUM 40 MG/0.4ML ~~LOC~~ SOLN
40.0000 mg | SUBCUTANEOUS | Status: DC
  Administered 2017-12-30 – 2017-12-31 (×2): 40 mg via SUBCUTANEOUS
  Filled 2017-12-30 (×2): qty 0.4

## 2017-12-30 MED ORDER — DOCUSATE SODIUM 100 MG PO CAPS
100.0000 mg | ORAL_CAPSULE | Freq: Two times a day (BID) | ORAL | Status: DC
  Administered 2017-12-30 – 2018-01-01 (×3): 100 mg via ORAL
  Filled 2017-12-30 (×4): qty 1

## 2017-12-30 MED ORDER — HYDROCODONE-ACETAMINOPHEN 5-325 MG PO TABS
1.0000 | ORAL_TABLET | ORAL | Status: DC | PRN
  Administered 2018-01-01: 1 via ORAL
  Filled 2017-12-30: qty 1

## 2017-12-30 MED ORDER — INSULIN ASPART 100 UNIT/ML ~~LOC~~ SOLN
0.0000 [IU] | Freq: Three times a day (TID) | SUBCUTANEOUS | Status: DC
  Administered 2017-12-30: 8 [IU] via SUBCUTANEOUS
  Administered 2017-12-31: 11 [IU] via SUBCUTANEOUS
  Administered 2017-12-31: 8 [IU] via SUBCUTANEOUS
  Administered 2017-12-31: 5 [IU] via SUBCUTANEOUS
  Administered 2018-01-01: 8 [IU] via SUBCUTANEOUS
  Filled 2017-12-30 (×5): qty 1

## 2017-12-30 MED ORDER — LABETALOL HCL 5 MG/ML IV SOLN
10.0000 mg | Freq: Once | INTRAVENOUS | Status: AC
  Administered 2017-12-30: 10 mg via INTRAVENOUS
  Filled 2017-12-30: qty 4

## 2017-12-30 NOTE — ED Notes (Signed)
PT resting in bed, NAD noted at this time. Pt denies any complaints/needs at this time.

## 2017-12-30 NOTE — ED Triage Notes (Signed)
Pt c/o intermittent chest pain/tightness that radiates into the back for the past 3-4 days. Denies SOB, N/V or diaphoresis.

## 2017-12-30 NOTE — ED Provider Notes (Signed)
[facility] Emergency Department Provider Note   ____________________________________________    I have reviewed the triage vital signs and the nursing notes.   HISTORY  Chief Complaint Chest Pain     HPI Christian [name] is a 47 y.o. male with a history of coronary artery disease status post stent placement in 2015, history of diabetes and hypertension who presents today with chest pain.  Patient reports overnight he felt like someone was pushing an ice pick into his chest for separate times.  Currently he has no pain.  He also reports 2 days ago he had a knot in his upper back which he states was unusual.  Denies shortness of breath.  No pleurisy.  No calf pain or swelling.  Has not taken anything for this.   Past Medical History:  Diagnosis Date  . CAD (coronary artery disease)    a. 07/2013 NSTEMI/PCI: LCX 49m (4.0x23 Xience DES), RCA 22m, EF > 55%;  b. 07/2013 Echo: EF 60-65%, diast dysfxn, mild LVH, mildly dil LA, nl RV size/fxn, nl RVSP.  Marland Kitchen History of tobacco abuse    a. quit ~ 2010  . Hyperlipidemia   . Morbid obesity ([place])    a. weighed 168 @ age 41.    Patient Active Problem List   Diagnosis Date Noted  . CAD (coronary artery disease)   . HTN (hypertension)   . Hyperlipidemia   . Diabetes mellitus, type II ([place])   . Morbid obesity ([place])   . History of tobacco abuse     Past Surgical History:  Procedure Laterality Date  . ADENOIDECTOMY    . CARDIAC CATHETERIZATION  07/2013   [facility]'x1 stent    Prior to Admission medications   Medication Sig Start Date End Date Taking? Authorizing Provider  diphenhydrAMINE HCl (THERAFLU MULTI SYMPTOM PO) Take by mouth as directed.   Yes [provider]     Allergies Patient has no known allergies.  Family History  Problem Relation Age of Onset  . Heart disease Mother   . Esophageal cancer Father     Social History Social History   Tobacco Use  . Smoking status: Former Smoker   Packs/day: 0.50    Years: 20.00    Pack years: 10.00    Types: Cigarettes  Substance Use Topics  . Alcohol use: No  . Drug use: No    Types: Marijuana    Comment: past    Review of Systems  Constitutional: No fever/chills Eyes: No visual changes.  ENT: No sore throat. Cardiovascular: As above Respiratory: Denies shortness of breath. Gastrointestinal: No abdominal pain.  No nausea, no vomiting.   Genitourinary: Negative for dysuria. Musculoskeletal: As above Skin: Negative for rash. Neurological: Negative for headaches   ____________________________________________   PHYSICAL EXAM:  VITAL SIGNS: ED Triage Vitals  Enc Vitals Group     BP 12/30/17 0801 (!) 212/115     Pulse Rate 12/30/17 0801 (!) 109     Resp 12/30/17 0801 18     Temp 12/30/17 0802 98.7 F (37.1 C)     Temp Source 12/30/17 0801 Oral     SpO2 12/30/17 0801 99 %     Weight 12/30/17 0800 81.6 kg (180 lb)     Height 12/30/17 0800 1.676 m (5\' 6" )     Head Circumference --      Peak Flow --      Pain Score 12/30/17 0800 3     Pain Loc --  Pain Edu? --      Excl. in [place]? --     Constitutional: Alert and oriented.  Eyes: Conjunctivae are normal.   Nose: No congestion/rhinnorhea. Mouth/Throat: Mucous membranes are moist.    Cardiovascular: Normal rate, regular rhythm. Grossly normal heart sounds.  Good peripheral circulation.  No rash Respiratory: Normal respiratory effort.  No retractions. Lungs CTAB. Gastrointestinal: Soft and nontender. No distention.  No CVA tenderness.  Musculoskeletal: No lower extremity tenderness nor edema.  Warm and well perfused.  No muscle spasm or tenderness to palpation of the upper back, no vertebral tenderness to palpation. Neurologic:  Normal speech and language. No gross focal neurologic deficits are appreciated.  Skin:  Skin is warm, dry and intact. No rash noted. Psychiatric: Mood and affect are normal. Speech and behavior are  normal.  ____________________________________________   LABS (all labs ordered are listed, but only abnormal results are displayed)  Labs Reviewed  COMPREHENSIVE METABOLIC PANEL - Abnormal; Notable for the following components:      Result Value   Potassium 3.4 (*)    Chloride 93 (*)    Glucose, Bld 285 (*)    Creatinine, Ser 0.60 (*)    Total Bilirubin 1.4 (*)    All other components within normal limits  CBC  TROPONIN I  APTT  PROTIME-INR   ____________________________________________  EKG  ED ECG REPORT I, Lavonia Drafts, the attending physician, personally viewed and interpreted this ECG.  Date: 12/30/2017  Rhythm: Tachycardia QRS Axis: normal Intervals: normal ST/T Wave abnormalities: Nonspecific changes   ____________________________________________  RADIOLOGY  Chest x-ray ____________________________________________   PROCEDURES  Procedure(s) performed: No  Procedures   Critical Care performed: No ____________________________________________   INITIAL IMPRESSION / ASSESSMENT AND PLAN / ED COURSE  Pertinent labs & imaging results that were available during my care of the patient were reviewed by me and considered in my medical decision making (see chart for details).  Patient presents with chest pain as detailed above, also reports of intermittent upper back discomfort, this did not appear to be occurring at the same time however given tachycardia, significant hypertension, history of CAD certainly there is concern about ACS, possibly even dissection.  Pending labs, chest x-ray.  Patient is pain-free at this time,  CT angiography negative for dissection.  Given high risk chest pain I will admit for further evaluation    ____________________________________________   FINAL CLINICAL IMPRESSION(S) / ED DIAGNOSES  Final diagnoses:  Chest pain, unspecified type  Essential hypertension        Note:  This document was prepared using Dragon  voice recognition software and may include unintentional dictation errors.    Lavonia Drafts, MD 12/30/17 1102

## 2017-12-30 NOTE — Progress Notes (Signed)
Inpatient Diabetes Program Recommendations  AACE/ADA: New Consensus Statement on Inpatient Glycemic Control (2015)  Target Ranges:  Prepandial:   less than 140 mg/dL      Peak postprandial:   less than 180 mg/dL (1-2 hours)      Critically ill patients:  140 - 180 mg/dL   Lab Results  Component Value Date   HGBA1C 7.5 (H) 08/11/2013    Review of Glycemic ControlResults for Christian, [name] (MRN [id number]) as of 12/30/2017 13:58  Ref. Range 12/30/2017 08:38  Glucose Latest Ref Range: 70 - 99 mg/dL 285 (H)    Diabetes history: Type 2 per Problem list Outpatient Diabetes medications: None Current orders for Inpatient glycemic control: None Inpatient Diabetes Program Recommendations:   A1C pending.  Please consider adding Novolog moderate tid with meals and HS due to history of DM and lab glucose>200 mg/dL.   Thanks  Christian Perl, RN, BC-ADM Inpatient Diabetes Coordinator Pager [phone] (8a-5p)

## 2017-12-30 NOTE — ED Notes (Signed)
Pt continues to rest in bed, denies any needs at this time. Will continue to monitor for further patient needs. Explained admission process to patient at this time.

## 2017-12-30 NOTE — ED Notes (Signed)
Patient transported to CT 

## 2017-12-30 NOTE — H&P (Signed)
[facility] [place] NAME: Christian [name]    MR#:  [id number]  DATE OF BIRTH:  08/09/1970  DATE OF ADMISSION:  12/30/2017  PRIMARY CARE PHYSICIAN: Patient, No Pcp Per   REQUESTING/REFERRING PHYSICIAN: Dr. Corky Downs  CHIEF COMPLAINT: Chest pain                      Chief Complaint  Patient presents with  . Chest Pain    HISTORY OF PRESENT ILLNESS:  Christian [name]  is a 47 y.o. male with a known history of coronary artery disease with previous stent in left circumflex in 2015 and not followed up with PCP since then, not on any medicines comes in with sudden onset of chest pressure started last few last night radiated to the back and feels like someone is pushing by his back into his chest, patient did not have any nausea vomiting or diaphoresis associated with these episodes.  No fever.  Patient found to have severely elevated blood pressure in the emergency room 212/115, received a dose of IV labetalol in the emergency room, CT chest is done to evaluate for dissection and CT chest is negative for aortic dissection.  We were called to admit the patient for chest pain evaluation.  Patient did not get aspirin or nitroglycerin in the emergency room.  EKG, troponins are within normal range.  Patient Still complains of occasional chest pressure.  PAST MEDICAL HISTORY:   Past Medical History:  Diagnosis Date  . CAD (coronary artery disease)    a. 07/2013 NSTEMI/PCI: LCX 41m (4.0x23 Xience DES), RCA 75m, EF > 55%;  b. 07/2013 Echo: EF 60-65%, diast dysfxn, mild LVH, mildly dil LA, nl RV size/fxn, nl RVSP.  Marland Kitchen History of tobacco abuse    a. quit ~ 2010  . Hyperlipidemia   . Morbid obesity ([place])    a. weighed 168 @ age 27.    PAST SURGICAL HISTOIRY:   Past Surgical History:  Procedure Laterality Date  . ADENOIDECTOMY    . CARDIAC CATHETERIZATION  07/2013   [facility]x1 stent    SOCIAL HISTORY:   Social History   Tobacco Use  . Smoking status:  Former Smoker    Packs/day: 0.50    Years: 20.00    Pack years: 10.00    Types: Cigarettes  Substance Use Topics  . Alcohol use: No  Told me he is a heavy drinker and drinks 1/5 because of the day.  No withdrawal symptoms if he does not drink.  FAMILY HISTORY:   Family History  Problem Relation Age of Onset  . Heart disease Mother   . Esophageal cancer Father     DRUG ALLERGIES:  No Known Allergies  REVIEW OF SYSTEMS:  CONSTITUTIONAL: No fever, fatigue or weakness.  EYES: No blurred or double vision.  EARS, NOSE, AND THROAT: No tinnitus or ear pain.  RESPIRATORY: No cough, shortness of breath, wheezing or hemoptysis.  CARDIOVASCULAR: Sternal chest pressure going to the back  gASTROINTESTINAL: No nausea, vomiting, diarrhea or abdominal pain.  GENITOURINARY: No dysuria, hematuria.  ENDOCRINE: No polyuria, nocturia,  HEMATOLOGY: No anemia, easy bruising or bleeding SKIN: No rash or lesion. MUSCULOSKELETAL: No joint pain or arthritis.   NEUROLOGIC: No tingling, numbness, weakness.  PSYCHIATRY: No anxiety or depression.   MEDICATIONS AT HOME:   Prior to Admission medications   Medication Sig Start Date End Date Taking? Authorizing Provider  diphenhydrAMINE HCl (THERAFLU MULTI SYMPTOM PO) Take  by mouth as directed.   Yes [provider]      VITAL SIGNS:  Blood pressure (!) 166/105, pulse 86, temperature 98.7 F (37.1 C), resp. rate 17, height 5\' 6"  (1.676 m), weight 81.6 kg, SpO2 96 %.  PHYSICAL EXAMINATION:  GENERAL:  47 y.o.-year-old patient lying in the bed with no acute distress.  EYES: Pupils equal, round, reactive to light and accommodation. No scleral icterus. Extraocular muscles intact.  HEENT: Head atraumatic, normocephalic. Oropharynx and nasopharynx clear.  NECK:  Supple, no jugular venous distention. No thyroid enlargement, no tenderness.  LUNGS: Normal breath sounds bilaterally, no wheezing, rales,rhonchi or crepitation. No use of accessory muscles  of respiration.  CARDIOVASCULAR: S1, S2 normal. No murmurs, rubs, or gallops.  ABDOMEN: Soft, nontender, nondistended. Bowel sounds present. No organomegaly or mass.  EXTREMITIES: No pedal edema, cyanosis, or clubbing.  NEUROLOGIC: Cranial nerves II through XII are intact. Muscle strength 5/5 in all extremities. Sensation intact. Gait not checked.  PSYCHIATRIC: The patient is alert and oriented x 3.  SKIN: No obvious rash, lesion, or ulcer.   LABORATORY PANEL:   CBC Recent Labs  Lab 12/30/17 0838  WBC 4.3  HGB 15.1  HCT 42.3  PLT 193   ------------------------------------------------------------------------------------------------------------------  Chemistries  Recent Labs  Lab 12/30/17 0838  NA 135  K 3.4*  CL 93*  CO2 30  GLUCOSE 285*  BUN 6  CREATININE 0.60*  CALCIUM 9.2  AST 15  ALT 16  ALKPHOS 62  BILITOT 1.4*   ------------------------------------------------------------------------------------------------------------------  Cardiac Enzymes Recent Labs  Lab 12/30/17 0838  TROPONINI <0.03   ------------------------------------------------------------------------------------------------------------------  RADIOLOGY:  Dg Chest 2 View  Result Date: 12/30/2017 CLINICAL DATA:  Chest pain; hypertension EXAM: CHEST - 2 VIEW COMPARISON:  Aug 10, 2013 FINDINGS: There is stable scarring in the left base and right mid lung regions. There is no edema or consolidation. Heart size and pulmonary vascularity are normal. No adenopathy. No evident bone lesions. IMPRESSION: Areas of scarring bilaterally. No edema or consolidation. Stable cardiac silhouette. Electronically Signed   By: Lowella Grip III M.D.   On: 12/30/2017 08:47   Ct Angio Chest/abd/pel For Dissection W And/or Wo Contrast  Result Date: 12/30/2017 CLINICAL DATA:  Intermittent chest pain and tightness radiating into the back for the past 3-4 days. EXAM: CT ANGIOGRAPHY CHEST, ABDOMEN AND PELVIS TECHNIQUE:  Multidetector CT imaging through the chest, abdomen and pelvis was performed using the standard protocol during bolus administration of intravenous contrast. Multiplanar reconstructed images and MIPs were obtained and reviewed to evaluate the vascular anatomy. CONTRAST:  100 mL ISOVUE-370 IOPAMIDOL (ISOVUE-370) INJECTION 76% COMPARISON:  None. FINDINGS: CTA CHEST FINDINGS Cardiovascular: Preferential opacification of the thoracic aorta. No evidence of thoracic aortic aneurysm or dissection. Normal heart size. No pericardial effusion. Calcific coronary artery atherosclerosis is noted. Mediastinum/Nodes: No enlarged mediastinal, hilar, or axillary lymph nodes. Thyroid gland, trachea, and esophagus demonstrate no significant findings. Lungs/Pleura: Lungs are clear. No pleural effusion or pneumothorax. Musculoskeletal: No acute or focal abnormality. Review of the MIP images confirms the above findings. CTA ABDOMEN AND PELVIS FINDINGS VASCULAR Aorta: Normal caliber aorta without aneurysm, dissection, vasculitis or significant stenosis. Celiac: Patent without evidence of aneurysm, dissection, vasculitis or significant stenosis. SMA: Patent without evidence of aneurysm, dissection, vasculitis or significant stenosis. Renals: Both renal arteries are patent without evidence of aneurysm, dissection, vasculitis, fibromuscular dysplasia or significant stenosis. IMA: Patent without evidence of aneurysm, dissection, vasculitis or significant stenosis. Inflow: Patent without evidence of aneurysm, dissection, vasculitis or  significant stenosis. Veins: No obvious venous abnormality within the limitations of this arterial phase study. Review of the MIP images confirms the above findings. NON-VASCULAR Hepatobiliary: There is diffuse fatty infiltration of the liver. No focal lesion. Gallbladder and biliary tree appear normal. Pancreas: Unremarkable. No pancreatic ductal dilatation or surrounding inflammatory changes. Spleen: Normal in  size without focal abnormality. Adrenals/Urinary Tract: 4.1 cm right renal cyst is identified. The kidneys otherwise appear normal. Ureters and urinary bladder are normal. The adrenal glands appear normal. Stomach/Bowel: Stomach is within normal limits. Appendix appears normal. No evidence of bowel wall thickening, distention, or inflammatory changes. Lymphatic: No lymphadenopathy. Reproductive: Prostate is unremarkable. Other: Small fat containing right inguinal hernia is identified. Musculoskeletal: No acute or focal abnormality. Degenerative disc disease L5-S1 is seen. Review of the MIP images confirms the above findings. IMPRESSION: No acute abnormality chest, abdomen or pelvis. Negative for aortic dissection or aneurysm. Calcific aortic coronary atherosclerosis. Fatty infiltration of the liver. Small fat containing right inguinal hernia. Electronically Signed   By: Inge Rise M.D.   On: 12/30/2017 10:32    EKG:   Orders placed or performed during the hospital encounter of 12/30/17  . EKG 12-Lead  . EKG 12-Lead   EKG shows sinus tachycardia with 101 bpm, no ST changes. IMPRESSION AND PLAN:   47 year old male with history of essential hypertension, CAD, PCI in 2015 had not seen any PCP in a while comes in with chest pressure, found to have severely elevated blood pressure. 1.  Midsternal chest pressure likely secondary to severely elevated blood pressure, but because he is a history of CAD, PCI admitted to observation status on telemetry, started on aspirin, beta-blockers, statins, check fasting lipids, cycle the troponins, control of blood pressure beta-blockers, nitrates, added IV hydralazine as needed, negative Lexiscan stress test in the morning otherwise request cardiology consult. 2.  Hypertensive urgency: Received labetalol in the emergency room, added beta-blockers, nitrates, hydralazine. 3.  Prediabetes, patient hemoglobin A1c 6.74 years ago as per him.  Check hemoglobin A1c this  time. 4.  EtOH abuse, counseled to quit, add thiamine, folic acid.  Patient states he quit smoking but he is still drinking and sometimes heavy.    All the records are reviewed and case discussed with ED provider. Management plans discussed with the patient, family and they are in agreement.  CODE STATUS: Full code  TOTAL TIME TAKING CARE OF THIS PATIENT: 55 minutes.    Epifanio Lesches M.D on 12/30/2017 at 12:04 PM  Between 7am to 6pm - Pager - [phone]  After 6pm go to [email or web address] - password [email or web address]  Office  [phone]  CC: Primary care physician; Patient, No Pcp Per  Note: This dictation was prepared with Dragon dictation along with smaller phrase technology. Any transcriptional errors that result from this process are unintentional.

## 2017-12-30 NOTE — ED Notes (Signed)
Admitting MD at bedside at this time.

## 2017-12-31 ENCOUNTER — Observation Stay (HOSPITAL_BASED_OUTPATIENT_CLINIC_OR_DEPARTMENT_OTHER)
Admit: 2017-12-31 | Discharge: 2017-12-31 | Disposition: A | Discharge status: Home / Self Care | Payer: Self-pay | Attending: Cardiovascular Disease | Admitting: Cardiovascular Disease

## 2017-12-31 ENCOUNTER — Observation Stay (HOSPITAL_BASED_OUTPATIENT_CLINIC_OR_DEPARTMENT_OTHER): Payer: Self-pay

## 2017-12-31 DIAGNOSIS — R079 Chest pain, unspecified: Secondary | ICD-10-CM

## 2017-12-31 DIAGNOSIS — I10 Essential (primary) hypertension: Secondary | ICD-10-CM

## 2017-12-31 DIAGNOSIS — I5030 Unspecified diastolic (congestive) heart failure: Secondary | ICD-10-CM

## 2017-12-31 LAB — LIPID PANEL
CHOLESTEROL: 222 mg/dL — AB (ref 0–200)
HDL: 34 mg/dL — ABNORMAL LOW (ref >40)
LDL Cholesterol: UNDETERMINED mg/dL (ref 0–99)
Total CHOL/HDL Ratio: 6.5 RATIO
Triglycerides: 660 mg/dL — ABNORMAL HIGH (ref <150)
VLDL: UNDETERMINED mg/dL (ref 0–40)

## 2017-12-31 LAB — NM MYOCAR MULTI W/SPECT W/WALL MOTION / EF
CHL CUP NUCLEAR SSS: 2
CSEPED: 0 min
CSEPEW: 1 METS
CSEPHR: 67 %
CSEPPHR: 116 {beats}/min
Exercise duration (sec): 0 s
LV dias vol: 113 mL (ref 62–150)
LV sys vol: 38 mL
MPHR: 172 {beats}/min
Rest HR: 83 {beats}/min
SDS: 0
SRS: 1
TID: 1.13

## 2017-12-31 LAB — BASIC METABOLIC PANEL
Anion gap: 10 (ref 5–15)
BUN: 9 mg/dL (ref 6–20)
CALCIUM: 8.6 mg/dL — AB (ref 8.9–10.3)
CO2: 26 mmol/L (ref 22–32)
CREATININE: 0.73 mg/dL (ref 0.61–1.24)
Chloride: 99 mmol/L (ref 98–111)
GFR calc Af Amer: >60 mL/min (ref >60)
GFR calc non Af Amer: >60 mL/min (ref >60)
GLUCOSE: 297 mg/dL — AB (ref 70–99)
Potassium: 3.2 mmol/L — ABNORMAL LOW (ref 3.5–5.1)
Sodium: 135 mmol/L (ref 135–145)

## 2017-12-31 LAB — CBC
HCT: 37.6 % — ABNORMAL LOW (ref 39.0–52.0)
Hemoglobin: 13.9 g/dL (ref 13.0–17.0)
MCH: 32.7 pg (ref 26.0–34.0)
MCHC: 37 g/dL — ABNORMAL HIGH (ref 30.0–36.0)
MCV: 88.5 fL (ref 80.0–100.0)
PLATELETS: 191 10*3/uL (ref 150–400)
RBC: 4.25 MIL/uL (ref 4.22–5.81)
RDW: 12.5 % (ref 11.5–15.5)
WBC: 5.1 10*3/uL (ref 4.0–10.5)
nRBC: 0 % (ref 0.0–0.2)

## 2017-12-31 LAB — TROPONIN I: TROPONIN I: 0.03 ng/mL — AB (ref <0.03)

## 2017-12-31 LAB — GLUCOSE, CAPILLARY
GLUCOSE-CAPILLARY: 334 mg/dL — AB (ref 70–99)
Glucose-Capillary: 322 mg/dL — ABNORMAL HIGH (ref 70–99)
Glucose-Capillary: 289 mg/dL — ABNORMAL HIGH (ref 70–99)
Glucose-Capillary: 331 mg/dL — ABNORMAL HIGH (ref 70–99)
Glucose-Capillary: 225 mg/dL — ABNORMAL HIGH (ref 70–99)

## 2017-12-31 LAB — HIV ANTIBODY (ROUTINE TESTING W REFLEX): HIV SCREEN 4TH GENERATION: NONREACTIVE

## 2017-12-31 LAB — MAGNESIUM: MAGNESIUM: 2 mg/dL (ref 1.7–2.4)

## 2017-12-31 LAB — ECHOCARDIOGRAM COMPLETE
Height: 66 in
WEIGHTICAEL: 2974.4 [oz_av]

## 2017-12-31 MED ORDER — METOPROLOL TARTRATE 50 MG PO TABS
100.0000 mg | ORAL_TABLET | Freq: Two times a day (BID) | ORAL | Status: DC
  Administered 2017-12-31: 100 mg via ORAL
  Filled 2017-12-31: qty 2

## 2017-12-31 MED ORDER — AMLODIPINE BESYLATE 5 MG PO TABS: 5.0000 mg | ORAL_TABLET | ORAL | Status: DC | PRN

## 2017-12-31 MED ORDER — AMLODIPINE BESYLATE 5 MG PO TABS
5.0000 mg | ORAL_TABLET | Freq: Once | ORAL | Status: AC
  Administered 2017-12-31: 5 mg via ORAL
  Filled 2017-12-31: qty 1

## 2017-12-31 MED ORDER — TECHNETIUM TC 99M TETROFOSMIN IV KIT
33.5200 | PACK | Freq: Once | INTRAVENOUS | Status: AC | PRN
  Administered 2017-12-31: 33.52 via INTRAVENOUS

## 2017-12-31 MED ORDER — FOLIC ACID 1 MG PO TABS
1.0000 mg | ORAL_TABLET | Freq: Every day | ORAL | Status: DC
  Administered 2017-12-31 – 2018-01-01 (×2): 1 mg via ORAL
  Filled 2017-12-31 (×2): qty 1

## 2017-12-31 MED ORDER — HYDRALAZINE HCL 20 MG/ML IJ SOLN: 10.0000 mg | Freq: Four times a day (QID) | INTRAMUSCULAR | Status: DC | PRN

## 2017-12-31 MED ORDER — METFORMIN HCL 500 MG PO TABS
1000.0000 mg | ORAL_TABLET | Freq: Two times a day (BID) | ORAL | Status: DC
  Administered 2017-12-31 – 2018-01-01 (×2): 1000 mg via ORAL
  Filled 2017-12-31 (×2): qty 2

## 2017-12-31 MED ORDER — POTASSIUM CHLORIDE CRYS ER 20 MEQ PO TBCR
40.0000 meq | EXTENDED_RELEASE_TABLET | Freq: Two times a day (BID) | ORAL | Status: AC
  Administered 2017-12-31 (×2): 40 meq via ORAL
  Filled 2017-12-31 (×2): qty 2

## 2017-12-31 MED ORDER — TECHNETIUM TC 99M TETROFOSMIN IV KIT
10.7900 | PACK | Freq: Once | INTRAVENOUS | Status: AC | PRN
  Administered 2017-12-31: 10.79 via INTRAVENOUS

## 2017-12-31 MED ORDER — LOSARTAN POTASSIUM 50 MG PO TABS
50.0000 mg | ORAL_TABLET | Freq: Every day | ORAL | Status: DC
  Administered 2017-12-31: 50 mg via ORAL
  Filled 2017-12-31: qty 1

## 2017-12-31 MED ORDER — GLIPIZIDE 5 MG PO TABS
5.0000 mg | ORAL_TABLET | Freq: Every day | ORAL | Status: DC
  Administered 2018-01-01: 5 mg via ORAL
  Filled 2017-12-31: qty 1

## 2017-12-31 MED ORDER — ADULT MULTIVITAMIN W/MINERALS CH
1.0000 | ORAL_TABLET | Freq: Every day | ORAL | Status: DC
  Administered 2017-12-31: 1 via ORAL
  Filled 2017-12-31: qty 1

## 2017-12-31 MED ORDER — REGADENOSON 0.4 MG/5ML IV SOLN
0.4000 mg | Freq: Once | INTRAVENOUS | Status: AC
  Administered 2017-12-31: 0.4 mg via INTRAVENOUS

## 2017-12-31 MED ORDER — LIVING WELL WITH DIABETES BOOK
Freq: Once | Status: AC
  Administered 2017-12-31: 17:00:00
  Filled 2017-12-31: qty 1

## 2017-12-31 MED ORDER — POTASSIUM CHLORIDE CRYS ER 20 MEQ PO TBCR: 40.0000 meq | EXTENDED_RELEASE_TABLET | Freq: Once | ORAL | Status: DC

## 2017-12-31 MED ORDER — LOSARTAN POTASSIUM 50 MG PO TABS
50.0000 mg | ORAL_TABLET | Freq: Once | ORAL | Status: AC
  Administered 2017-12-31: 50 mg via ORAL
  Filled 2017-12-31: qty 1

## 2017-12-31 MED ORDER — CARVEDILOL 12.5 MG PO TABS
12.5000 mg | ORAL_TABLET | Freq: Two times a day (BID) | ORAL | Status: DC
  Administered 2017-12-31 – 2018-01-01 (×2): 12.5 mg via ORAL
  Filled 2017-12-31 (×2): qty 1

## 2017-12-31 MED ORDER — AMLODIPINE BESYLATE 5 MG PO TABS
5.0000 mg | ORAL_TABLET | Freq: Every day | ORAL | Status: DC
  Administered 2017-12-31: 5 mg via ORAL
  Filled 2017-12-31: qty 1

## 2017-12-31 MED ORDER — VITAMIN B-1 100 MG PO TABS
100.0000 mg | ORAL_TABLET | Freq: Every day | ORAL | Status: DC
  Administered 2017-12-31 – 2018-01-01 (×2): 100 mg via ORAL
  Filled 2017-12-31 (×2): qty 1

## 2017-12-31 MED ORDER — LOSARTAN POTASSIUM 50 MG PO TABS
100.0000 mg | ORAL_TABLET | Freq: Every day | ORAL | Status: DC
  Administered 2018-01-01: 100 mg via ORAL
  Filled 2017-12-31: qty 2

## 2017-12-31 NOTE — Care Management Note (Signed)
Case Management Note  Patient Details  Name: Christian [name] MRN: [id number] Date of Birth: 02-27-1971  Subjective/Objective:      Patient placed in observation for chest pain.  From home; lives with mother.  Does not have insurance currently.  Financial counseling has been to patients room to assess.  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   Patient states he will make an appointment with [facility] and will pick up prescription at [facility] on discharge.        Action/Plan:Gave application to [facility] and [facility].    In basket message sent to Lorrie with [facility] to give a heads up on new patient application.          Expected Discharge Date:                  Expected Discharge Plan:  Home/Self Care  In-House Referral:     Discharge planning Services  CM Consult  Post Acute Care Choice:    Choice offered to:     DME Arranged:    DME Agency:     HH Arranged:    HH Agency:     Status of Service:  In process, will continue to follow  If discussed at Long Length of Stay Meetings, dates discussed:    Additional Comments:  Elza Rafter, RN 12/31/2017, 2:06 PM

## 2017-12-31 NOTE — Consult Note (Signed)
Cardiology Consultation:   Patient ID: NASHON ERBES MRN: [id number]; DOB: 09-26-1970  Admit date: 12/30/2017 Date of Consult: 12/31/2017  Primary Care Provider: Patient, No Pcp Per Primary Cardiologist: Ida Rogue, MD  Primary Electrophysiologist:  None    Patient Profile:   Christian [name] is a 47 y.o. male with a hx of CAD and previous stent in the LCx 2015 (EF 60-65% 2015), DM2, HTN, HLD, current and heavy reported EtOH use, previous smoker (quit 2010), and morbid obesity who is being seen today for the evaluation of chest pain with h/o CAD at the request of Dr. Posey Pronto.  History of Present Illness:   Christian [name] is a 47 yo male with PMH as above and h/o CAD. 07/2017 Patient was admitted to [facility] May 2015 with chest pain that began after he was changing a light bulb in his oven, resulting in him getting shocked/electrocuted. He underwent subsequent PCI/DES to left circumflex performed d/t NSTEMI & discharged on aspirin, bb, & statin. Lasix was prescribed 2/2 diastolic dysfunction; however this was discontinued at his post hospital follow up on 08/18/13 as he did not appear to be volume overloaded and bb titrated accordingly.  He then underwent post MI stress test and echo on 12/23/2013 that revealed an EF of 60-65% & no regional wall motion abnormalities: diast dysfxn, mild LVH, mildly dil LA, nl RV size/fxn, nl RVSP.  Last seen in the office by Christian Faith, PA-C on 11/2013. At that time, he was reportedly doing well and asx with plan for continued medical management. 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 He was instructed to follow-up with Dr. Rockey Situ in 6 months but was lost to follow-up as shortly thereafter he lost his job. Patient also reported he stopped taking his medications after losing his job as well, d/t cost without insurance.  Patient  reports smoking 1/5 liquor 6 of 7 days weekly. He does not smoke or do any illegal drugs.  12/30/17, patient presented to [facility] ED having reportedly experienced midsternal to left sided chest pressure that wrapped around his left side x10 days. He stated that he attributed this chest pressure to indigestion as it was worse lying down and better -sitting up. However, the night of 10/7-10/8, he felt 5-6 episodes of painful, sharp, stabbing chest pain (rated 8/10) that was non-pleuritic and non-radiating and reportedly localized to his L 5th ICS. This chest pain was associated with feeling hot / diaphoresis and concerned him, resulting in his seeking medical attention. He denied associated nausea vomiting, diaphoresis, fever.    In the ED-  Patient was found to be hypertensive and tachycardic. BP 212/115. He subsequently received a dose of IV labetalol in the emergency room.  ED Vitals: BP 212/115, HR 109, RR 18, T 98.7, 99%ORA  Labs: K 3.4, Na 135, glucose 285, Cr 0.60, Ca 9.2, AST 15, ALT 16, Troponin negative x4, WBC 4.3, Hgb 15.1, plts 193; HgbA1C 10.0 EKG: Sinus tachycardia, ST/T wave abnormalities, TWI inferior leads  CXR: Areas of scarring bilaterally, no edema or consolidation. Stable cardiac silhouette CTA: No acute abnormalities and negative for dissection or aneurysm of aorta. 4.1cm right renal cyst identified. Fatty infiltration of liver, calcific aortic coronary atherosclerosis.  Since presenting to the ED, patient has not had any further episodes of chest pain or  chest pressure and does not have any cardiac complaints.  He underwent a Lexiscan today 10/9 with EF 35% and possible attenuation artifact. He is scheduled for an echo to be performed today as well.     Past Medical History:  Diagnosis Date  . CAD (coronary artery disease)    a. 07/2013 NSTEMI/PCI: LCX 10m (4.0x23 Xience DES), RCA 5m, EF > 55%;  b. 07/2013 Echo: EF 60-65%, diast dysfxn, mild LVH, mildly dil LA, nl RV size/fxn,  nl RVSP.  Marland Kitchen History of tobacco abuse    a. quit ~ 2010  . Hyperlipidemia   . Morbid obesity ([place])    a. weighed 168 @ age 7.    Past Surgical History:  Procedure Laterality Date  . ADENOIDECTOMY    . CARDIAC CATHETERIZATION  07/2013   [facility]'x1 stent     Home Medications:  Prior to Admission medications   Medication Sig Start Date End Date Taking? Authorizing Provider  diphenhydrAMINE HCl (THERAFLU MULTI SYMPTOM PO) Take by mouth as directed.   Yes [provider]    Inpatient Medications: Scheduled Meds: . amLODipine  5 mg Oral Daily  . aspirin  81 mg Oral Daily  . docusate sodium  100 mg Oral BID  . enoxaparin (LOVENOX) injection  40 mg Subcutaneous Q24H  . folic acid  1 mg Oral Daily  . insulin aspart  0-15 Units Subcutaneous TID WC  . living well with diabetes book   Does not apply Once  . [START ON 01/01/2018] losartan  100 mg Oral Daily  . losartan  50 mg Oral Once  . metoprolol tartrate  100 mg Oral BID  . multivitamin with minerals  1 tablet Oral Daily  . nitroGLYCERIN  0.5 inch Topical Q6H  . potassium chloride  40 mEq Oral Once  . rosuvastatin  10 mg Oral Daily  . thiamine  100 mg Oral Daily   Continuous Infusions:  PRN Meds: acetaminophen **OR** acetaminophen, bisacodyl, hydrALAZINE, HYDROcodone-acetaminophen, ondansetron **OR** ondansetron (ZOFRAN) IV  Allergies:   No Known Allergies  Social History:   Social History   Socioeconomic History  . Marital status: Single    Spouse name: Not on file  . Number of children: Not on file  . Years of education: Not on file  . Highest education level: Not on file  Occupational History  . Not on file  Social Needs  . Financial resource strain: Not on file  . Food insecurity:    Worry: Not on file    Inability: Not on file  . Transportation needs:    Medical: Not on file    Non-medical: Not on file  Tobacco Use  . Smoking status: Former Smoker    Packs/day: 0.50    Years: 20.00    Pack years:  10.00    Types: Cigarettes  . Smokeless tobacco: Never Used  Substance and Sexual Activity  . Alcohol use: No  . Drug use: No    Types: Marijuana    Comment: past  . Sexual activity: Not on file  Lifestyle  . Physical activity:    Days per week: Not on file    Minutes per session: Not on file  . Stress: Not on file  Relationships  . Social connections:    Talks on phone: Not on file    Gets together: Not on file    Attends religious service: Not on file    Active member of club or organization: Not on file  Attends meetings of clubs or organizations: Not on file    Relationship status: Not on file  . Intimate partner violence:    Fear of current or ex partner: Not on file    Emotionally abused: Not on file    Physically abused: Not on file    Forced sexual activity: Not on file  Other Topics Concern  . Not on file  Social History Narrative  . Not on file    Family History:    Family History  Problem Relation Age of Onset  . Heart disease Mother   . Esophageal cancer Father      ROS:  Please see the history of present illness.   All other ROS reviewed and negative.     Physical Exam/Data:   Vitals:   12/31/17 0021 12/31/17 0509 12/31/17 0729 12/31/17 0949  BP: (!) 152/100 (!) 160/97 (!) 157/101 (!) 179/108  Pulse: 71 75 79 90  Resp:   18 18  Temp:  98.2 F (36.8 C) 98 F (36.7 C)   TempSrc:  Oral Oral   SpO2: 95% 94% 95% 95%  Weight:  84.3 kg    Height:        Intake/Output Summary (Last 24 hours) at 12/31/2017 1107 Last data filed at 12/31/2017 1014 Gross per 24 hour  Intake 1380 ml  Output 2350 ml  Net -970 ml   Filed Weights   12/30/17 0800 12/30/17 1209 12/31/17 0509  Weight: 81.6 kg 83 kg 84.3 kg   Body mass index is 30.01 kg/m.  General:  Well nourished, well developed, in no acute distress. Watching a Investment banker, operational. HEENT: normal Lymph: no adenopathy Neck: no JVD Endocrine:  No thryomegaly Vascular: No carotid bruits Cardiac:   normal S1, S2; RRR; + murmur heard on exam Lungs:  clear to auscultation bilaterally, no wheezing, rhonchi or rales  Abd: soft, nontender, no hepatomegaly noted  Ext: no edema Musculoskeletal:  No deformities Skin: warm and dry  Neuro:  no focal abnormalities noted Psych:  Normal affect   EKG: Refer to HPI Telemetry:  Telemetry was personally reviewed and demonstrates: SR, HR 70-80s  CV Studies:   Relevant CV Studies:  Pending results of echo given below study with EF estimated 35%  12/31/17 Lexiscan Pharmacological myocardial perfusion imaging study with no significant  Ischemia Small region of fixed apical defect on attenuation corrected images No wall motion abnormality noted in this region Non-attenuation corrected images with diaphragmatic attenuation artifact but no apical defect Normal wall motion, EF estimated at 35%.  GI uptake artifact noted which could contribute to artificially depressed ejection fraction estimate Grossly EKG changes concerning for ischemia at peak stress or in recovery.  Significant motion artifact Low risk scan Consider echocardiogram to confirm ejection fraction  12/2013 TTE - Left ventricle: The cavity size was normal. Systolic function was normal. The estimated ejection fraction was in the range of 60% to 65%. Wall motion was normal; there were no regional wall motion abnormalities. Left ventricular diastolic function parameters were normal. - Left atrium: The atrium was normal in size. - Right ventricle: Systolic function was normal. - Pulmonary arteries: Systolic pressure was within the normal range.  Laboratory Data:  Chemistry Recent Labs  Lab 12/30/17 0838 12/31/17 0445  NA 135 135  K 3.4* 3.2*  CL 93* 99  CO2 30 26  GLUCOSE 285* 297*  BUN 6 9  CREATININE 0.60* 0.73  CALCIUM 9.2 8.6*  GFRNONAA >60 >60  GFRAA >60 >60  ANIONGAP  12 10    Recent Labs  Lab 12/30/17 0838  PROT 7.3  ALBUMIN 4.3  AST 15  ALT 16    ALKPHOS 62  BILITOT 1.4*   Hematology Recent Labs  Lab 12/30/17 0838 12/31/17 0445  WBC 4.3 5.1  RBC 4.56 4.25  HGB 15.1 13.9  HCT 42.3 37.6*  MCV 92.9 88.5  MCH 33.1 32.7  MCHC 35.6 37.0*  RDW 13.0 12.5  PLT 193 191   Cardiac Enzymes Recent Labs  Lab 12/30/17 0838 12/30/17 1846 12/30/17 2304 12/31/17 0445  TROPONINI <0.03 0.03* 0.03* 0.03*   No results for input(s): TROPIPOC in the last 168 hours.  BNPNo results for input(s): BNP, PROBNP in the last 168 hours.  DDimer No results for input(s): DDIMER in the last 168 hours.  Radiology/Studies:  Dg Chest 2 View  Result Date: 12/30/2017 CLINICAL DATA:  Chest pain; hypertension EXAM: CHEST - 2 VIEW COMPARISON:  Aug 10, 2013 FINDINGS: There is stable scarring in the left base and right mid lung regions. There is no edema or consolidation. Heart size and pulmonary vascularity are normal. No adenopathy. No evident bone lesions. IMPRESSION: Areas of scarring bilaterally. No edema or consolidation. Stable cardiac silhouette. Electronically Signed   By: Lowella Grip III M.D.   On: 12/30/2017 08:47   Ct Angio Chest/abd/pel For Dissection W And/or Wo Contrast  Result Date: 12/30/2017 CLINICAL DATA:  Intermittent chest pain and tightness radiating into the back for the past 3-4 days. EXAM: CT ANGIOGRAPHY CHEST, ABDOMEN AND PELVIS TECHNIQUE: Multidetector CT imaging through the chest, abdomen and pelvis was performed using the standard protocol during bolus administration of intravenous contrast. Multiplanar reconstructed images and MIPs were obtained and reviewed to evaluate the vascular anatomy. CONTRAST:  100 mL ISOVUE-370 IOPAMIDOL (ISOVUE-370) INJECTION 76% COMPARISON:  None. FINDINGS: CTA CHEST FINDINGS Cardiovascular: Preferential opacification of the thoracic aorta. No evidence of thoracic aortic aneurysm or dissection. Normal heart size. No pericardial effusion. Calcific coronary artery atherosclerosis is noted.  Mediastinum/Nodes: No enlarged mediastinal, hilar, or axillary lymph nodes. Thyroid gland, trachea, and esophagus demonstrate no significant findings. Lungs/Pleura: Lungs are clear. No pleural effusion or pneumothorax. Musculoskeletal: No acute or focal abnormality. Review of the MIP images confirms the above findings. CTA ABDOMEN AND PELVIS FINDINGS VASCULAR Aorta: Normal caliber aorta without aneurysm, dissection, vasculitis or significant stenosis. Celiac: Patent without evidence of aneurysm, dissection, vasculitis or significant stenosis. SMA: Patent without evidence of aneurysm, dissection, vasculitis or significant stenosis. Renals: Both renal arteries are patent without evidence of aneurysm, dissection, vasculitis, fibromuscular dysplasia or significant stenosis. IMA: Patent without evidence of aneurysm, dissection, vasculitis or significant stenosis. Inflow: Patent without evidence of aneurysm, dissection, vasculitis or significant stenosis. Veins: No obvious venous abnormality within the limitations of this arterial phase study. Review of the MIP images confirms the above findings. NON-VASCULAR Hepatobiliary: There is diffuse fatty infiltration of the liver. No focal lesion. Gallbladder and biliary tree appear normal. Pancreas: Unremarkable. No pancreatic ductal dilatation or surrounding inflammatory changes. Spleen: Normal in size without focal abnormality. Adrenals/Urinary Tract: 4.1 cm right renal cyst is identified. The kidneys otherwise appear normal. Ureters and urinary bladder are normal. The adrenal glands appear normal. Stomach/Bowel: Stomach is within normal limits. Appendix appears normal. No evidence of bowel wall thickening, distention, or inflammatory changes. Lymphatic: No lymphadenopathy. Reproductive: Prostate is unremarkable. Other: Small fat containing right inguinal hernia is identified. Musculoskeletal: No acute or focal abnormality. Degenerative disc disease L5-S1 is seen. Review of  the MIP images confirms the  above findings. IMPRESSION: No acute abnormality chest, abdomen or pelvis. Negative for aortic dissection or aneurysm. Calcific aortic coronary atherosclerosis. Fatty infiltration of the liver. Small fat containing right inguinal hernia. Electronically Signed   By: Inge Rise M.D.   On: 12/30/2017 10:32    Assessment and Plan:   Chest pain in the setting of hypertensive urgency with known h/o CAD s/p 2015 stent to LCx; Concerning for unstable angina - Currently chest pain / pressure free. - As noted above, stopped cardiac medications and follow-up following loss of his job in 2015. Additional risk factors for cardiac etiology of chest pain include HTN, HLD, DM2. In ED, Troponin negative x4, EKG as above in HPI.  - 10/9 myoview significant for EF 35% with follow-up echo to confirm EF pending. At this time, cannot r/o cardiac etiology of chest pain. Lexiscan negative for ischemia but cannot r/o chest pain in the setting of poorly controlled BP with BP 212/115   179/108; HR 109  90bpm.  - Daily BMET: Hypokalemia in the setting of alcohol abuse with K 3.4  3.2. Repleting with KCl tab 40mg  BID for 2 doses and goal 4.0. Check Magnesium. Cr 0.60 arrow 0.73.  - Stop lopressor 100mg  BID po qd and amlodipine 5mg  po qd, given low EF - Start Coreg 12.5mg  BID as better for low EF. Titration of Coreg as needed for continued elevated BP.  - Continue ASA 81mg  daily, hydralazine 10mg  IV 16h for SBP >180, losartan 100mg  po qd, SL nitro PRN.  Continue lovenox injection 40mg  for DVT prophylaxis - Recommend restart cardiac medications with daily asa 81mg , Coreg (given reduced EF, diuretic, and statin. Recommend check BP and HR at home, daily weights.  - Will need to schedule for cardiology outpatient follow-up.Recommend follow-up outpatient echo in 3-6 months if EF remains low on echo today 10/9 to see if improvement in EF with control of HR and BP.   HTN - Consider HTN etiologies:  alcohol induced HTN in the setting of alcohol withdrawal; renal cyst as noted in CTA at admission.  - BP remains elevated as above with EF 35% - Antihypertensive medication changes as above given elevated BP and low EF: Stop lopressor and amlodipine. Start Coreg. Continue losartan, hydralazine. - Continue to monitor BP and HR, as well as telemetry  HLD - Continue statin as above - LFTs within limits - LDL 117 in 2015 - Will recheck lipid panel with goal LDL <70  Hypokalemia - As above. Replete with goal 4.0  DM2, uncontrolled  - A1C 10 - SSI - Per IM  Alcohol abuse - Continue thiamine, folic acid - Cessation advised   For questions or updates, please contact [place] Please consult [email or web address] for contact info under     Signed, Arvil Chaco, PA-C  12/31/2017 11:07 AM

## 2017-12-31 NOTE — Progress Notes (Signed)
Inpatient Diabetes Program Recommendations  AACE/ADA: New Consensus Statement on Inpatient Glycemic Control (2015)  Target Ranges:  Prepandial:   less than 140 mg/dL      Peak postprandial:   less than 180 mg/dL (1-2 hours)      Critically ill patients:  140 - 180 mg/dL   Lab Results  Component Value Date   GLUCAP 322 (H) 12/31/2017   HGBA1C 10.0 (H) 12/30/2017    Review of Glycemic ControlResults for YAO, HYPPOLITE (MRN [id number]) as of 12/31/2017 09:47  Ref. Range 12/30/2017 16:54 12/30/2017 20:57 12/31/2017 07:30  Glucose-Capillary Latest Ref Range: 70 - 99 mg/dL 269 (H) 284 (H) 322 (H)    Diabetes history: Type 2 DM  Outpatient Diabetes medications: None Current orders for Inpatient glycemic control:  Novolog moderate tid with meals Inpatient Diabetes Program Recommendations:    While in the hospital, please add Lantus 16 units daily.  Also consider adding Novolog 3 units tid with meals.  Based on A1C , patient will need diabetes medications add prior to d/c.  Need f/u with PCP and likely the addition of metformin and possibly Amaryl (both are on the 4$ list).  Will see patient today to discuss DM management.   Thanks,  Adah Perl, RN, BC-ADM Inpatient Diabetes Coordinator Pager [phone] (8a-5p)

## 2017-12-31 NOTE — Progress Notes (Signed)
  Spoke with patient regarding A1C and diabetes management.  He states that he was diagnosed in 2015 and was on metformin.  He also attended DM classes back then and states he was doing well and got his A1C back down to normal.  He feels that his blood sugars have gone back up due to his ETOH intake. 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  We discussed the importance of F/U with PCP as [facility]. We also discussed the importance of diet, exercise, and seeing MD.  We talked about normal blood sugar levels and his current A1C results of 10% with a goal of 7%.  He has already resumed metformin.  Discussed that metformin is not recommended with ETOH as well bc of its action on the liver.  Patient verbalized understanding. No further recommendations at this time.   Thanks  Adah Perl, RN, BC-ADM Inpatient Diabetes Coordinator Pager [phone] (8a-5p)

## 2017-12-31 NOTE — Progress Notes (Signed)
[place] at [facility]                                                                                                                                                                                  Patient Demographics   Christian [name], is a 47 y.o. male, DOB - 01-11-1971, YPP:[id number]  Admit date - 12/30/2017   Admitting Physician Epifanio Lesches, MD  Outpatient Primary MD for the patient is Patient, No Pcp Per   LOS - 0  Subjective: Patient doing better still having intermittent chest pressure.    Review of Systems:   CONSTITUTIONAL: No documented fever. No fatigue, weakness. No weight gain, no weight loss.  EYES: No blurry or double vision.  ENT: No tinnitus. No postnasal drip. No redness of the oropharynx.  RESPIRATORY: No cough, no wheeze, no hemoptysis. No dyspnea.  CARDIOVASCULAR: Positive chest pain. No orthopnea. No palpitations. No syncope.  GASTROINTESTINAL: No nausea, no vomiting or diarrhea. No abdominal pain. No melena or hematochezia.  GENITOURINARY: No dysuria or hematuria.  ENDOCRINE: No polyuria or nocturia. No heat or cold intolerance.  HEMATOLOGY: No anemia. No bruising. No bleeding.  INTEGUMENTARY: No rashes. No lesions.  MUSCULOSKELETAL: No arthritis. No swelling. No gout.  NEUROLOGIC: No numbness, tingling, or ataxia. No seizure-type activity.  PSYCHIATRIC: No anxiety. No insomnia. No ADD.    Vitals:   Vitals:   12/31/17 0021 12/31/17 0509 12/31/17 0729 12/31/17 0949  BP: (!) 152/100 (!) 160/97 (!) 157/101 (!) 179/108  Pulse: 71 75 79 90  Resp:   18 18  Temp:  98.2 F (36.8 C) 98 F (36.7 C)   TempSrc:  Oral Oral   SpO2: 95% 94% 95% 95%  Weight:  84.3 kg    Height:        Wt Readings from Last 3 Encounters:  12/31/17 84.3 kg  12/23/13 90.6 kg  12/15/13 90.6 kg     Intake/Output Summary (Last 24 hours) at 12/31/2017 1313 Last data filed at 12/31/2017 1152 Gross per 24 hour  Intake 1380 ml  Output 2350 ml   Net -970 ml    Physical Exam:   GENERAL: Pleasant-appearing in no apparent distress.  HEAD, EYES, EARS, NOSE AND THROAT: Atraumatic, normocephalic. Extraocular muscles are intact. Pupils equal and reactive to light. Sclerae anicteric. No conjunctival injection. No oro-pharyngeal erythema.  NECK: Supple. There is no jugular venous distention. No bruits, no lymphadenopathy, no thyromegaly.  HEART: Regular rate and rhythm,. No murmurs, no rubs, no clicks.  LUNGS: Clear to auscultation bilaterally. No rales or rhonchi. No wheezes.  ABDOMEN: Soft, flat, nontender, nondistended. Has good bowel sounds. No hepatosplenomegaly appreciated.  EXTREMITIES: No evidence  of any cyanosis, clubbing, or peripheral edema.  +2 pedal and radial pulses bilaterally.  NEUROLOGIC: The patient is alert, awake, and oriented x3 with no focal motor or sensory deficits appreciated bilaterally.  SKIN: Moist and warm with no rashes appreciated.  Psych: Not anxious, depressed LN: No inguinal LN enlargement    Antibiotics   Anti-infectives (From admission, onward)   None      Medications   Scheduled Meds: . aspirin  81 mg Oral Daily  . carvedilol  12.5 mg Oral BID WC  . docusate sodium  100 mg Oral BID  . enoxaparin (LOVENOX) injection  40 mg Subcutaneous Q24H  . folic acid  1 mg Oral Daily  . insulin aspart  0-15 Units Subcutaneous TID WC  . living well with diabetes book   Does not apply Once  . [START ON 01/01/2018] losartan  100 mg Oral Daily  . multivitamin with minerals  1 tablet Oral Daily  . nitroGLYCERIN  0.5 inch Topical Q6H  . potassium chloride  40 mEq Oral BID  . rosuvastatin  10 mg Oral Daily  . thiamine  100 mg Oral Daily   Continuous Infusions: PRN Meds:.acetaminophen **OR** acetaminophen, bisacodyl, hydrALAZINE, HYDROcodone-acetaminophen, ondansetron **OR** ondansetron (ZOFRAN) IV   Data Review:   Micro Results No results found for this or any previous visit (from the past 240  hour(s)).  Radiology Reports Dg Chest 2 View  Result Date: 12/30/2017 CLINICAL DATA:  Chest pain; hypertension EXAM: CHEST - 2 VIEW COMPARISON:  Aug 10, 2013 FINDINGS: There is stable scarring in the left base and right mid lung regions. There is no edema or consolidation. Heart size and pulmonary vascularity are normal. No adenopathy. No evident bone lesions. IMPRESSION: Areas of scarring bilaterally. No edema or consolidation. Stable cardiac silhouette. Electronically Signed   By: Lowella Grip III M.D.   On: 12/30/2017 08:47   Nm Myocar Multi W/spect W/wall Motion / Ef  Result Date: 12/31/2017 Pharmacological myocardial perfusion imaging study with no significant  Ischemia Small region of fixed apical defect on attenuation corrected images No wall motion abnormality noted in this region Non-attenuation corrected images with diaphragmatic attenuation artifact but no apical defect Normal wall motion, EF estimated at 35%.  GI uptake artifact noted which could contribute to artificially depressed ejection fraction estimate Grossly EKG changes concerning for ischemia at peak stress or in recovery.  Significant motion artifact Low risk scan Consider echocardiogram to confirm ejection fraction Signed, Esmond Plants, MD, Ph.D [facility]   Ct Angio Chest/abd/pel For Dissection W And/or Wo Contrast  Result Date: 12/30/2017 CLINICAL DATA:  Intermittent chest pain and tightness radiating into the back for the past 3-4 days. EXAM: CT ANGIOGRAPHY CHEST, ABDOMEN AND PELVIS TECHNIQUE: Multidetector CT imaging through the chest, abdomen and pelvis was performed using the standard protocol during bolus administration of intravenous contrast. Multiplanar reconstructed images and MIPs were obtained and reviewed to evaluate the vascular anatomy. CONTRAST:  100 mL ISOVUE-370 IOPAMIDOL (ISOVUE-370) INJECTION 76% COMPARISON:  None. FINDINGS: CTA CHEST FINDINGS Cardiovascular: Preferential opacification of the thoracic  aorta. No evidence of thoracic aortic aneurysm or dissection. Normal heart size. No pericardial effusion. Calcific coronary artery atherosclerosis is noted. Mediastinum/Nodes: No enlarged mediastinal, hilar, or axillary lymph nodes. Thyroid gland, trachea, and esophagus demonstrate no significant findings. Lungs/Pleura: Lungs are clear. No pleural effusion or pneumothorax. Musculoskeletal: No acute or focal abnormality. Review of the MIP images confirms the above findings. CTA ABDOMEN AND PELVIS FINDINGS VASCULAR Aorta: Normal caliber aorta without  aneurysm, dissection, vasculitis or significant stenosis. Celiac: Patent without evidence of aneurysm, dissection, vasculitis or significant stenosis. SMA: Patent without evidence of aneurysm, dissection, vasculitis or significant stenosis. Renals: Both renal arteries are patent without evidence of aneurysm, dissection, vasculitis, fibromuscular dysplasia or significant stenosis. IMA: Patent without evidence of aneurysm, dissection, vasculitis or significant stenosis. Inflow: Patent without evidence of aneurysm, dissection, vasculitis or significant stenosis. Veins: No obvious venous abnormality within the limitations of this arterial phase study. Review of the MIP images confirms the above findings. NON-VASCULAR Hepatobiliary: There is diffuse fatty infiltration of the liver. No focal lesion. Gallbladder and biliary tree appear normal. Pancreas: Unremarkable. No pancreatic ductal dilatation or surrounding inflammatory changes. Spleen: Normal in size without focal abnormality. Adrenals/Urinary Tract: 4.1 cm right renal cyst is identified. The kidneys otherwise appear normal. Ureters and urinary bladder are normal. The adrenal glands appear normal. Stomach/Bowel: Stomach is within normal limits. Appendix appears normal. No evidence of bowel wall thickening, distention, or inflammatory changes. Lymphatic: No lymphadenopathy. Reproductive: Prostate is unremarkable. Other:  Small fat containing right inguinal hernia is identified. Musculoskeletal: No acute or focal abnormality. Degenerative disc disease L5-S1 is seen. Review of the MIP images confirms the above findings. IMPRESSION: No acute abnormality chest, abdomen or pelvis. Negative for aortic dissection or aneurysm. Calcific aortic coronary atherosclerosis. Fatty infiltration of the liver. Small fat containing right inguinal hernia. Electronically Signed   By: Inge Rise M.D.   On: 12/30/2017 10:32     CBC Recent Labs  Lab 12/30/17 0838 12/31/17 0445  WBC 4.3 5.1  HGB 15.1 13.9  HCT 42.3 37.6*  PLT 193 191  MCV 92.9 88.5  MCH 33.1 32.7  MCHC 35.6 37.0*  RDW 13.0 12.5    Chemistries  Recent Labs  Lab 12/30/17 0838 12/31/17 0445 12/31/17 1139  NA 135 135  --   K 3.4* 3.2*  --   CL 93* 99  --   CO2 30 26  --   GLUCOSE 285* 297*  --   BUN 6 9  --   CREATININE 0.60* 0.73  --   CALCIUM 9.2 8.6*  --   MG  --   --  2.0  AST 15  --   --   ALT 16  --   --   ALKPHOS 62  --   --   BILITOT 1.4*  --   --    ------------------------------------------------------------------------------------------------------------------ estimated creatinine clearance is 115 mL/min (by C-G formula based on SCr of 0.73 mg/dL). ------------------------------------------------------------------------------------------------------------------ Recent Labs    12/30/17 0838  HGBA1C 10.0*   ------------------------------------------------------------------------------------------------------------------ Recent Labs    12/31/17 1139  CHOL 222*  HDL 34*  LDLCALC UNABLE TO CALCULATE IF TRIGLYCERIDE OVER 400 mg/dL  TRIG 660*  CHOLHDL 6.5   ------------------------------------------------------------------------------------------------------------------ No results for input(s): TSH, T4TOTAL, T3FREE, THYROIDAB in the last 72 hours.  Invalid input(s):  FREET3 ------------------------------------------------------------------------------------------------------------------ No results for input(s): VITAMINB12, FOLATE, FERRITIN, TIBC, IRON, RETICCTPCT in the last 72 hours.  Coagulation profile Recent Labs  Lab 12/30/17 0838  INR 0.90    No results for input(s): DDIMER in the last 72 hours.  Cardiac Enzymes Recent Labs  Lab 12/30/17 1846 12/30/17 2304 12/31/17 0445  TROPONINI 0.03* 0.03* 0.03*   ------------------------------------------------------------------------------------------------------------------ Invalid input(s): POCBNP    Assessment & Plan   47 year old male with history of essential hypertension, CAD, PCI in 2015 had not seen any PCP in a while comes in with chest pressure, found to have severely elevated blood pressure. 1.  Midsternal  chest pressure could be related to elevated blood pressure stress test shows no evidence of ischemia seen by cardiology patient recommended strongly to take aspirin for rest of his life, continue metoprolol 2.  Hypertensive urgency: Patient started on Cozaar and Cozaar has been aided by cardiology blood pressure continues to be elevated will adjust his medication 3.    Diabetes type 2 start patient on metformin glipizide diabetic teaching 4.  EtOH abuse, counseled to quit monitor for withdrawal     Code Status Orders  (From admission, onward)         Start     Ordered   12/30/17 1108  Full code  Continuous     12/30/17 1110        Code Status History    This patient has a current code status but no historical code status.           Consults cardiolgy  DVT Prophylaxis  Lovenox   Lab Results  Component Value Date   PLT 191 12/31/2017     Time Spent in minutes   60min Greater than 50% of time spent in care coordination and counseling patient regarding the condition and plan of care.   Dustin Flock M.D on 12/31/2017 at 1:13 PM  Between 7am to 6pm - Pager  - [phone]  After 6pm go to [email or web address] - Proofreader  [employer]   Office  [phone]

## 2017-12-31 NOTE — Progress Notes (Signed)
Nutrition Brief Note  Patient identified on the Malnutrition Screening Tool (MST) Report  47 year old male with history of etoh abuse, essential hypertension, CAD, PCI in 2015 had not seen any PCP in a while comes in with chest pressure, found to have severely elevated blood pressure.  Wt Readings from Last 15 Encounters:  12/31/17 84.3 kg  12/23/13 90.6 kg  12/15/13 90.6 kg  08/18/13 96.8 kg    Body mass index is 30.01 kg/m. Patient meets criteria for obesity based on current BMI.   Current diet order is HH/CHO, patient is consuming approximately 100% of meals at this time. Labs and medications reviewed.   RD will add MVI, thiamine and folic acid in the setting of etoh abuse.   No further nutrition interventions warranted at this time. If nutrition issues arise, please consult RD.   Koleen Distance MS, RD, LDN Pager #- [phone] Office#- [phone] After Hours Pager: [phone]

## 2017-12-31 NOTE — Progress Notes (Signed)
*  PRELIMINARY RESULTS* Echocardiogram 2D Echocardiogram has been performed.  Christian [name] 12/31/2017, 2:54 PM

## 2018-01-01 LAB — GLUCOSE, CAPILLARY: Glucose-Capillary: 256 mg/dL — ABNORMAL HIGH (ref 70–99)

## 2018-01-01 MED ORDER — LOSARTAN POTASSIUM 100 MG PO TABS: 100.0000 mg | ORAL_TABLET | Freq: Every day | ORAL | 2 refills | Status: DC

## 2018-01-01 MED ORDER — GLIPIZIDE 10 MG PO TABS: 10.0000 mg | ORAL_TABLET | Freq: Two times a day (BID) | ORAL | 11 refills | Status: DC

## 2018-01-01 MED ORDER — LOSARTAN POTASSIUM 100 MG PO TABS: 100.0000 mg | ORAL_TABLET | Freq: Every day | ORAL | 11 refills | Status: DC

## 2018-01-01 MED ORDER — ROSUVASTATIN CALCIUM 10 MG PO TABS: 10.0000 mg | ORAL_TABLET | Freq: Every day | ORAL | 0 refills | Status: DC

## 2018-01-01 MED ORDER — METFORMIN HCL 1000 MG PO TABS: 1000.0000 mg | ORAL_TABLET | Freq: Two times a day (BID) | ORAL | 0 refills | Status: DC

## 2018-01-01 MED ORDER — ASPIRIN 81 MG PO CHEW: 81.0000 mg | CHEWABLE_TABLET | Freq: Every day | ORAL | Status: DC

## 2018-01-01 MED ORDER — CARVEDILOL 25 MG PO TABS: 25.0000 mg | ORAL_TABLET | Freq: Two times a day (BID) | ORAL | 11 refills | Status: DC

## 2018-01-01 NOTE — Discharge Summary (Signed)
Christian [name] at [facility], [name], DOB 04-Nov-1970, MRN [id number]. Admission date: 12/30/2017 Discharge Date 01/01/2018 Primary MD Patient, No Pcp Per Admitting Physician Epifanio Lesches, MD  Admission Diagnosis  Essential hypertension [I10] Chest pain, unspecified type [R07.9]  Discharge Diagnosis   Active Problems:   Chest pain non cardiac HTN urgency  DM2 Etoh abuse      Hospital Course  Christian [name]  is a 47 y.o. male with a known history of coronary artery disease with previous stent in left circumflex in 2015. Pt presented with chest pain noted to have elevated bp.  Patient has been noncompliant with his medications he stopped taking aspirin other cardiac medications after he had a stent.  In the hospital blood pressure was very high.  He was started on blood pressure medications his hemoglobin A1c was very high as well.  He was started on oral diabetic medication.  Patient had a stress test which was negative.  He was seen by cardiology and was cleared to go home.  I have strongly recommend that he take his medications.           Consults  cardiology  Significant Tests:  See full reports for all details     Dg Chest 2 View  Result Date: 12/30/2017 CLINICAL DATA:  Chest pain; hypertension EXAM: CHEST - 2 VIEW COMPARISON:  Aug 10, 2013 FINDINGS: There is stable scarring in the left base and right mid lung regions. There is no edema or consolidation. Heart size and pulmonary vascularity are normal. No adenopathy. No evident bone lesions. IMPRESSION: Areas of scarring bilaterally. No edema or consolidation. Stable cardiac silhouette. Electronically Signed   By: Lowella Grip III M.D.   On: 12/30/2017 08:47   Nm Myocar Multi W/spect W/wall Motion / Ef  Result Date: 12/31/2017 Pharmacological myocardial perfusion imaging study with no significant  Ischemia Small region of fixed apical defect on attenuation corrected images No  wall motion abnormality noted in this region Non-attenuation corrected images with diaphragmatic attenuation artifact but no apical defect Normal wall motion, EF estimated at 35%.  GI uptake artifact noted which could contribute to artificially depressed ejection fraction estimate Grossly EKG changes concerning for ischemia at peak stress or in recovery.  Significant motion artifact Low risk scan Consider echocardiogram to confirm ejection fraction Signed, Christian Plants, MD, Ph.D [facility]   Ct Angio Chest/abd/pel For Dissection W And/or Wo Contrast  Result Date: 12/30/2017 CLINICAL DATA:  Intermittent chest pain and tightness radiating into the back for the past 3-4 days. EXAM: CT ANGIOGRAPHY CHEST, ABDOMEN AND PELVIS TECHNIQUE: Multidetector CT imaging through the chest, abdomen and pelvis was performed using the standard protocol during bolus administration of intravenous contrast. Multiplanar reconstructed images and MIPs were obtained and reviewed to evaluate the vascular anatomy. CONTRAST:  100 mL ISOVUE-370 IOPAMIDOL (ISOVUE-370) INJECTION 76% COMPARISON:  None. FINDINGS: CTA CHEST FINDINGS Cardiovascular: Preferential opacification of the thoracic aorta. No evidence of thoracic aortic aneurysm or dissection. Normal heart size. No pericardial effusion. Calcific coronary artery atherosclerosis is noted. Mediastinum/Nodes: No enlarged mediastinal, hilar, or axillary lymph nodes. Thyroid gland, trachea, and esophagus demonstrate no significant findings. Lungs/Pleura: Lungs are clear. No pleural effusion or pneumothorax. Musculoskeletal: No acute or focal abnormality. Review of the MIP images confirms the above findings. CTA ABDOMEN AND PELVIS FINDINGS VASCULAR Aorta: Normal caliber aorta without aneurysm, dissection, vasculitis or significant stenosis. Celiac: Patent without evidence of aneurysm, dissection, vasculitis or significant stenosis. SMA: Patent  without evidence of aneurysm, dissection,  vasculitis or significant stenosis. Renals: Both renal arteries are patent without evidence of aneurysm, dissection, vasculitis, fibromuscular dysplasia or significant stenosis. IMA: Patent without evidence of aneurysm, dissection, vasculitis or significant stenosis. Inflow: Patent without evidence of aneurysm, dissection, vasculitis or significant stenosis. Veins: No obvious venous abnormality within the limitations of this arterial phase study. Review of the MIP images confirms the above findings. NON-VASCULAR Hepatobiliary: There is diffuse fatty infiltration of the liver. No focal lesion. Gallbladder and biliary tree appear normal. Pancreas: Unremarkable. No pancreatic ductal dilatation or surrounding inflammatory changes. Spleen: Normal in size without focal abnormality. Adrenals/Urinary Tract: 4.1 cm right renal cyst is identified. The kidneys otherwise appear normal. Ureters and urinary bladder are normal. The adrenal glands appear normal. Stomach/Bowel: Stomach is within normal limits. Appendix appears normal. No evidence of bowel wall thickening, distention, or inflammatory changes. Lymphatic: No lymphadenopathy. Reproductive: Prostate is unremarkable. Other: Small fat containing right inguinal hernia is identified. Musculoskeletal: No acute or focal abnormality. Degenerative disc disease L5-S1 is seen. Review of the MIP images confirms the above findings. IMPRESSION: No acute abnormality chest, abdomen or pelvis. Negative for aortic dissection or aneurysm. Calcific aortic coronary atherosclerosis. Fatty infiltration of the liver. Small fat containing right inguinal hernia. Electronically Signed   By: Christian [name] M.D.   On: 12/30/2017 10:32       Today   Subjective:   Christian [name]  Pt denies any complaints  Objective:   Blood pressure (!) 146/101, pulse 79, temperature 98.5 F (36.9 C), temperature source Oral, resp. rate 18, height 5\' 6"  (1.676 m), weight 83.3 kg, SpO2 98 %.   .  Intake/Output Summary (Last 24 hours) at 01/01/2018 1356 Last data filed at 01/01/2018 0710 Gross per 24 hour  Intake -  Output 2000 ml  Net -2000 ml    Exam VITAL SIGNS: Blood pressure (!) 146/101, pulse 79, temperature 98.5 F (36.9 C), temperature source Oral, resp. rate 18, height 5\' 6"  (1.676 m), weight 83.3 kg, SpO2 98 %.  GENERAL:  47 y.o.-year-old patient lying in the bed with no acute distress.  EYES: Pupils equal, round, reactive to light and accommodation. No scleral icterus. Extraocular muscles intact.  HEENT: Head atraumatic, normocephalic. Oropharynx and nasopharynx clear.  NECK:  Supple, no jugular venous distention. No thyroid enlargement, no tenderness.  LUNGS: Normal breath sounds bilaterally, no wheezing, rales,rhonchi or crepitation. No use of accessory muscles of respiration.  CARDIOVASCULAR: S1, S2 normal. No murmurs, rubs, or gallops.  ABDOMEN: Soft, nontender, nondistended. Bowel sounds present. No organomegaly or mass.  EXTREMITIES: No pedal edema, cyanosis, or clubbing.  NEUROLOGIC: Cranial nerves II through XII are intact. Muscle strength 5/5 in all extremities. Sensation intact. Gait not checked.  PSYCHIATRIC: The patient is alert and oriented x 3.  SKIN: No obvious rash, lesion, or ulcer.   Data Review     CBC w Diff:  Lab Results  Component Value Date   WBC 5.1 12/31/2017   HGB 13.9 12/31/2017   HGB 15.4 08/10/2013   HCT 37.6 (L) 12/31/2017   HCT 44.6 08/10/2013   PLT 191 12/31/2017   PLT 260 08/10/2013   LYMPHOPCT 57.7 07/12/2011   MONOPCT 5.7 07/12/2011   EOSPCT 3.2 07/12/2011   BASOPCT 0.8 07/12/2011   CMP:  Lab Results  Component Value Date   NA 135 12/31/2017   NA 136 08/12/2013   K 3.2 (L) 12/31/2017   K 3.8 08/12/2013   CL 99 12/31/2017   CL  104 08/12/2013   CO2 26 12/31/2017   CO2 26 08/12/2013   BUN 9 12/31/2017   BUN 9 08/12/2013   CREATININE 0.73 12/31/2017   CREATININE 0.95 08/12/2013   PROT 7.3 12/30/2017    PROT 7.0 12/15/2013   PROT 7.8 08/10/2013   ALBUMIN 4.3 12/30/2017   ALBUMIN 4.7 12/15/2013   ALBUMIN 4.2 08/10/2013   BILITOT 1.4 (H) 12/30/2017   BILITOT 1.2 (H) 08/10/2013   ALKPHOS 62 12/30/2017   ALKPHOS 83 08/10/2013   AST 15 12/30/2017   AST 37 08/10/2013   ALT 16 12/30/2017   ALT 36 08/10/2013  .  Micro Results No results found for this or any previous visit (from the past 240 hour(s)).      Code Status Orders  (From admission, onward)         Start     Ordered   12/30/17 1108  Full code  Continuous     12/30/17 1110        Code Status History    This patient has a current code status but no historical code status.          Follow-up Information    [facility] [place] In 1 week.   Specialty:  Primary Care Why:  new patient hosp   patient to call and set up appointment Contact information: [address]       [name] Mu, PA-C. Go on 02/02/2018.   Specialties:  Physician Assistant, Cardiology, Radiology Why:  appointment @ 9:30. Contact information: [place] [id number] [phone]           Discharge Medications   Allergies as of 01/01/2018   No Known Allergies     Medication List    STOP taking these medications   THERAFLU MULTI SYMPTOM PO     TAKE these medications   aspirin 81 MG chewable tablet Chew 1 tablet (81 mg total) by mouth daily.   carvedilol 25 MG tablet Commonly known as:  COREG Take 1 tablet (25 mg total) by mouth 2 (two) times daily.   glipiZIDE 10 MG tablet Commonly known as:  GLUCOTROL Take 1 tablet (10 mg total) by mouth 2 (two) times daily.   losartan 100 MG tablet Commonly known as:  COZAAR Take 1 tablet (100 mg total) by mouth daily.   losartan 100 MG tablet Commonly known as:  COZAAR Take 1 tablet (100 mg total) by mouth daily.   metFORMIN 1000 MG tablet Commonly known as:   GLUCOPHAGE Take 1 tablet (1,000 mg total) by mouth 2 (two) times daily with a meal.   rosuvastatin 10 MG tablet Commonly known as:  CRESTOR Take 1 tablet (10 mg total) by mouth daily.          Total Time in preparing paper work, data evaluation and todays exam - 27 minutes  Dustin Flock M.D on 01/01/2018 at [place]  [phone]

## 2018-01-01 NOTE — Progress Notes (Signed)
Inpatient Diabetes Program Recommendations  AACE/ADA: New Consensus Statement on Inpatient Glycemic Control (2019)  Target Ranges:  Prepandial:   less than 140 mg/dL      Peak postprandial:   less than 180 mg/dL (1-2 hours)      Critically ill patients:  140 - 180 mg/dL   Results for Christian [name], Christian [name] (MRN [id number]) as of 01/01/2018 10:03  Ref. Range 12/31/2017 07:30 12/31/2017 09:47 12/31/2017 11:40 12/31/2017 16:24 12/31/2017 21:08 01/01/2018 07:41  Glucose-Capillary Latest Ref Range: 70 - 99 mg/dL 322 (H) 289 (H) 331 (H) 225 (H) 334 (H) 256 (H)   Results for Christian [name], Christian [name] (MRN [id number]) as of 01/01/2018 10:03  Ref. Range 12/30/2017 08:38  Hemoglobin A1C Latest Ref Range: 4.8 - 5.6 % 10.0 (H)   Review of Glycemic Control  Diabetes history: DM2 Outpatient Diabetes medications: None Current orders for Inpatient glycemic control: Novolog 0-9 units TID with meals, Metformin 1000 mg BID, Glipizide 5 mg QAM  Inpatient Diabetes Program Recommendations: Correction (SSI): Please consider ordering Novolog 0-5 units QHS for bedtime correction. Oral Agents: Noted Metformin and Glipizide ordered on 12/31/17. HgbA1C: A1C 10% on 12/30/17 indicating an average glucose of 240 mg/dl over the past 2-3 months.   Thanks, Barnie Alderman, RN, MSN, CDE Diabetes Coordinator Inpatient Diabetes Program [phone] (Team Pager from 8am to 5pm)
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
## 2018-02-02 ENCOUNTER — Ambulatory Visit: Payer: Self-pay | Admitting: Physician Assistant

## 2018-03-26 ENCOUNTER — Emergency Department: Payer: Self-pay

## 2018-03-26 ENCOUNTER — Other Ambulatory Visit: Payer: Self-pay

## 2018-03-26 ENCOUNTER — Observation Stay
Admission: EM | Admit: 2018-03-26 | Discharge: 2018-03-27 | Disposition: A | Discharge status: Home / Self Care | Payer: Self-pay | Attending: Internal Medicine | Admitting: Internal Medicine

## 2018-03-26 DIAGNOSIS — R0789 Other chest pain: Principal | ICD-10-CM | POA: Insufficient documentation

## 2018-03-26 DIAGNOSIS — E785 Hyperlipidemia, unspecified: Secondary | ICD-10-CM | POA: Insufficient documentation

## 2018-03-26 DIAGNOSIS — E876 Hypokalemia: Secondary | ICD-10-CM | POA: Insufficient documentation

## 2018-03-26 DIAGNOSIS — Z9114 Patient's other noncompliance with medication regimen: Secondary | ICD-10-CM | POA: Insufficient documentation

## 2018-03-26 DIAGNOSIS — R000 Tachycardia, unspecified: Secondary | ICD-10-CM | POA: Insufficient documentation

## 2018-03-26 DIAGNOSIS — R739 Hyperglycemia, unspecified: Secondary | ICD-10-CM

## 2018-03-26 DIAGNOSIS — Z955 Presence of coronary angioplasty implant and graft: Secondary | ICD-10-CM | POA: Insufficient documentation

## 2018-03-26 DIAGNOSIS — Z79899 Other long term (current) drug therapy: Secondary | ICD-10-CM | POA: Insufficient documentation

## 2018-03-26 DIAGNOSIS — Z6829 Body mass index (BMI) 29.0-29.9, adult: Secondary | ICD-10-CM | POA: Insufficient documentation

## 2018-03-26 DIAGNOSIS — I2510 Atherosclerotic heart disease of native coronary artery without angina pectoris: Secondary | ICD-10-CM | POA: Insufficient documentation

## 2018-03-26 DIAGNOSIS — E781 Pure hyperglyceridemia: Secondary | ICD-10-CM | POA: Insufficient documentation

## 2018-03-26 DIAGNOSIS — I445 Left posterior fascicular block: Secondary | ICD-10-CM | POA: Insufficient documentation

## 2018-03-26 DIAGNOSIS — Z9119 Patient's noncompliance with other medical treatment and regimen: Secondary | ICD-10-CM | POA: Insufficient documentation

## 2018-03-26 DIAGNOSIS — E1159 Type 2 diabetes mellitus with other circulatory complications: Secondary | ICD-10-CM

## 2018-03-26 DIAGNOSIS — Z7984 Long term (current) use of oral hypoglycemic drugs: Secondary | ICD-10-CM | POA: Insufficient documentation

## 2018-03-26 DIAGNOSIS — E7800 Pure hypercholesterolemia, unspecified: Secondary | ICD-10-CM | POA: Insufficient documentation

## 2018-03-26 DIAGNOSIS — I10 Essential (primary) hypertension: Secondary | ICD-10-CM | POA: Insufficient documentation

## 2018-03-26 DIAGNOSIS — R079 Chest pain, unspecified: Secondary | ICD-10-CM | POA: Diagnosis present

## 2018-03-26 DIAGNOSIS — J984 Other disorders of lung: Secondary | ICD-10-CM | POA: Insufficient documentation

## 2018-03-26 DIAGNOSIS — I25118 Atherosclerotic heart disease of native coronary artery with other forms of angina pectoris: Secondary | ICD-10-CM

## 2018-03-26 DIAGNOSIS — I252 Old myocardial infarction: Secondary | ICD-10-CM | POA: Insufficient documentation

## 2018-03-26 DIAGNOSIS — Z8249 Family history of ischemic heart disease and other diseases of the circulatory system: Secondary | ICD-10-CM | POA: Insufficient documentation

## 2018-03-26 DIAGNOSIS — Z7982 Long term (current) use of aspirin: Secondary | ICD-10-CM | POA: Insufficient documentation

## 2018-03-26 DIAGNOSIS — F1010 Alcohol abuse, uncomplicated: Secondary | ICD-10-CM | POA: Insufficient documentation

## 2018-03-26 DIAGNOSIS — E1165 Type 2 diabetes mellitus with hyperglycemia: Secondary | ICD-10-CM | POA: Insufficient documentation

## 2018-03-26 DIAGNOSIS — R7989 Other specified abnormal findings of blood chemistry: Secondary | ICD-10-CM | POA: Insufficient documentation

## 2018-03-26 DIAGNOSIS — I160 Hypertensive urgency: Secondary | ICD-10-CM

## 2018-03-26 DIAGNOSIS — Z87891 Personal history of nicotine dependence: Secondary | ICD-10-CM | POA: Insufficient documentation

## 2018-03-26 LAB — BASIC METABOLIC PANEL
Anion gap: 14 (ref 5–15)
BUN: 10 mg/dL (ref 6–20)
CALCIUM: 9.2 mg/dL (ref 8.9–10.3)
CO2: 25 mmol/L (ref 22–32)
CREATININE: 0.79 mg/dL (ref 0.61–1.24)
Chloride: 91 mmol/L — ABNORMAL LOW (ref 98–111)
GFR calc Af Amer: >60 mL/min (ref >60)
Glucose, Bld: 366 mg/dL — ABNORMAL HIGH (ref 70–99)
Potassium: 3.4 mmol/L — ABNORMAL LOW (ref 3.5–5.1)
SODIUM: 130 mmol/L — AB (ref 135–145)

## 2018-03-26 LAB — TSH: TSH: 2.168 u[IU]/mL (ref 0.350–4.500)

## 2018-03-26 LAB — GLUCOSE, CAPILLARY
Glucose-Capillary: 277 mg/dL — ABNORMAL HIGH (ref 70–99)
Glucose-Capillary: 411 mg/dL — ABNORMAL HIGH (ref 70–99)
Glucose-Capillary: 290 mg/dL — ABNORMAL HIGH (ref 70–99)

## 2018-03-26 LAB — HEMOGLOBIN A1C
Hgb A1c MFr Bld: 9.2 % — ABNORMAL HIGH (ref 4.8–5.6)
Mean Plasma Glucose: 217.34 mg/dL

## 2018-03-26 LAB — CBC
HCT: 42 % (ref 39.0–52.0)
Hemoglobin: 15.6 g/dL (ref 13.0–17.0)
MCH: 32.2 pg (ref 26.0–34.0)
MCHC: 37.1 g/dL — AB (ref 30.0–36.0)
MCV: 86.8 fL (ref 80.0–100.0)
Platelets: 193 10*3/uL (ref 150–400)
RBC: 4.84 MIL/uL (ref 4.22–5.81)
RDW: 11.9 % (ref 11.5–15.5)
WBC: 5.8 10*3/uL (ref 4.0–10.5)
nRBC: 0 % (ref 0.0–0.2)

## 2018-03-26 LAB — TROPONIN I
TROPONIN I: 0.04 ng/mL — AB (ref <0.03)
Troponin I: 0.04 ng/mL (ref <0.03)
Troponin I: 0.04 ng/mL (ref <0.03)

## 2018-03-26 MED ORDER — ASPIRIN 81 MG PO CHEW
81.0000 mg | CHEWABLE_TABLET | Freq: Every day | ORAL | Status: DC
  Administered 2018-03-26 – 2018-03-27 (×2): 81 mg via ORAL
  Filled 2018-03-26 (×2): qty 1

## 2018-03-26 MED ORDER — CARVEDILOL 25 MG PO TABS
25.0000 mg | ORAL_TABLET | Freq: Two times a day (BID) | ORAL | Status: DC
  Administered 2018-03-26 – 2018-03-27 (×3): 25 mg via ORAL
  Filled 2018-03-26 (×3): qty 1

## 2018-03-26 MED ORDER — ASPIRIN 81 MG PO CHEW
324.0000 mg | CHEWABLE_TABLET | Freq: Once | ORAL | Status: AC
  Administered 2018-03-26: 324 mg via ORAL
  Filled 2018-03-26: qty 4

## 2018-03-26 MED ORDER — HYDRALAZINE HCL 20 MG/ML IJ SOLN
10.0000 mg | Freq: Four times a day (QID) | INTRAMUSCULAR | Status: DC | PRN
  Administered 2018-03-26: 10 mg via INTRAVENOUS
  Filled 2018-03-26: qty 1

## 2018-03-26 MED ORDER — INSULIN ASPART 100 UNIT/ML ~~LOC~~ SOLN
10.0000 [IU] | Freq: Once | SUBCUTANEOUS | Status: AC
  Administered 2018-03-26: 10 [IU] via SUBCUTANEOUS
  Filled 2018-03-26: qty 1

## 2018-03-26 MED ORDER — ONDANSETRON HCL 4 MG/2ML IJ SOLN: 4.0000 mg | Freq: Four times a day (QID) | INTRAMUSCULAR | Status: DC | PRN

## 2018-03-26 MED ORDER — ONDANSETRON HCL 4 MG PO TABS: 4.0000 mg | ORAL_TABLET | Freq: Four times a day (QID) | ORAL | Status: DC | PRN

## 2018-03-26 MED ORDER — METFORMIN HCL 500 MG PO TABS
1000.0000 mg | ORAL_TABLET | Freq: Two times a day (BID) | ORAL | Status: DC
  Administered 2018-03-27: 1000 mg via ORAL
  Filled 2018-03-26: qty 2

## 2018-03-26 MED ORDER — METOPROLOL TARTRATE 5 MG/5ML IV SOLN
5.0000 mg | Freq: Once | INTRAVENOUS | Status: AC
  Administered 2018-03-26: 5 mg via INTRAVENOUS
  Filled 2018-03-26: qty 5

## 2018-03-26 MED ORDER — ROSUVASTATIN CALCIUM 10 MG PO TABS
10.0000 mg | ORAL_TABLET | Freq: Every day | ORAL | Status: DC
  Administered 2018-03-26 – 2018-03-27 (×2): 10 mg via ORAL
  Filled 2018-03-26 (×2): qty 1

## 2018-03-26 MED ORDER — POTASSIUM CHLORIDE CRYS ER 20 MEQ PO TBCR
40.0000 meq | EXTENDED_RELEASE_TABLET | Freq: Once | ORAL | Status: AC
  Administered 2018-03-26: 40 meq via ORAL
  Filled 2018-03-26: qty 2

## 2018-03-26 MED ORDER — ACETAMINOPHEN 650 MG RE SUPP: 650.0000 mg | Freq: Four times a day (QID) | RECTAL | Status: DC | PRN

## 2018-03-26 MED ORDER — IBUPROFEN 400 MG PO TABS
400.0000 mg | ORAL_TABLET | Freq: Four times a day (QID) | ORAL | Status: DC | PRN
  Administered 2018-03-27: 400 mg via ORAL
  Filled 2018-03-26: qty 1

## 2018-03-26 MED ORDER — ENOXAPARIN SODIUM 40 MG/0.4ML ~~LOC~~ SOLN
40.0000 mg | SUBCUTANEOUS | Status: DC
  Administered 2018-03-26: 40 mg via SUBCUTANEOUS
  Filled 2018-03-26: qty 0.4

## 2018-03-26 MED ORDER — INSULIN ASPART 100 UNIT/ML ~~LOC~~ SOLN
0.0000 [IU] | Freq: Three times a day (TID) | SUBCUTANEOUS | Status: DC
  Administered 2018-03-26: 5 [IU] via SUBCUTANEOUS
  Administered 2018-03-27 (×2): 7 [IU] via SUBCUTANEOUS
  Filled 2018-03-26 (×4): qty 1

## 2018-03-26 MED ORDER — ACETAMINOPHEN 325 MG PO TABS: 650.0000 mg | ORAL_TABLET | Freq: Four times a day (QID) | ORAL | Status: DC | PRN

## 2018-03-26 MED ORDER — LOSARTAN POTASSIUM 50 MG PO TABS
100.0000 mg | ORAL_TABLET | Freq: Every day | ORAL | Status: DC
  Administered 2018-03-26: 100 mg via ORAL
  Filled 2018-03-26 (×2): qty 2

## 2018-03-26 MED ORDER — SODIUM CHLORIDE 0.9 % IV SOLN
INTRAVENOUS | Status: DC
  Administered 2018-03-26: 23:00:00 via INTRAVENOUS

## 2018-03-26 MED ORDER — NITROGLYCERIN 0.4 MG SL SUBL
0.4000 mg | SUBLINGUAL_TABLET | Freq: Once | SUBLINGUAL | Status: AC
  Administered 2018-03-26: 0.4 mg via SUBLINGUAL
  Filled 2018-03-26: qty 1

## 2018-03-26 MED ORDER — KETOROLAC TROMETHAMINE 30 MG/ML IJ SOLN
15.0000 mg | Freq: Four times a day (QID) | INTRAMUSCULAR | Status: DC | PRN
  Administered 2018-03-26 – 2018-03-27 (×2): 15 mg via INTRAVENOUS
  Filled 2018-03-26 (×2): qty 1

## 2018-03-26 MED ORDER — GLIPIZIDE 10 MG PO TABS
10.0000 mg | ORAL_TABLET | Freq: Two times a day (BID) | ORAL | Status: DC
  Administered 2018-03-26 – 2018-03-27 (×3): 10 mg via ORAL
  Filled 2018-03-26 (×4): qty 1

## 2018-03-26 NOTE — ED Notes (Signed)
This RN attempted report, charge unavailable to take report.  This RN was told by 2A that patient would be able to come up if during protected time.

## 2018-03-26 NOTE — ED Notes (Signed)
Pt up using restroom.

## 2018-03-26 NOTE — ED Triage Notes (Addendum)
Pt arrives to ED via POV from home with c/o left-sided CP with radiation into the upper back. Pt denies any accompanying s/x's (specifically no N/V/D, no diaphoresis, SHOB, or dizziness). Pt reports h/x of cardiac stent placed about 6 yrs ago, but denies other cardiac h/x. Pt does have a h/x of HTN, but admits non-compliance with r/x'd medications.  Pt reports taking 1000mg  of Tylenol PTA, but did NOT take aspirin.

## 2018-03-26 NOTE — ED Notes (Signed)
Attempted to call report, unable to at this time.

## 2018-03-26 NOTE — ED Provider Notes (Signed)
[facility] Emergency Department Provider Note       Time seen: ----------------------------------------- 7:32 AM on 03/26/2018 -----------------------------------------   I have reviewed the triage vital signs and the nursing notes.  HISTORY   Chief Complaint Chest Pain    HPI Christian [name] is a 48 y.o. male with a history of coronary artery disease, hyperlipidemia, hyperglycemia who presents to the ED for left-sided chest pain with radiation to his upper back.  He denies any accompanied symptoms, no nausea, no vomiting or diarrhea, no diaphoresis, shortness of breath or dizziness.  He reports a history of cardiac stent placed years ago.  He does have history of hypertension but has been noncompliant with his medications.  He was also told during his last visit he was hyperglycemic but is not taking medicines for that either.  Past Medical History:  Diagnosis Date  . CAD (coronary artery disease)    a. 07/2013 NSTEMI/PCI: LCX 16m (4.0x23 Xience DES), RCA 67m, EF > 55%;  b. 07/2013 Echo: EF 60-65%, diast dysfxn, mild LVH, mildly dil LA, nl RV size/fxn, nl RVSP.  Marland Kitchen History of tobacco abuse    a. quit ~ 2010  . Hyperlipidemia   . Morbid obesity ([place])    a. weighed 168 @ age 51.    Patient Active Problem List   Diagnosis Date Noted  . Chest pain 12/30/2017  . CAD (coronary artery disease)   . HTN (hypertension)   . Hyperlipidemia   . Diabetes mellitus, type II ([place])   . Morbid obesity ([place])   . History of tobacco abuse     Past Surgical History:  Procedure Laterality Date  . ADENOIDECTOMY    . CARDIAC CATHETERIZATION  07/2013   [facility]'x1 stent    Allergies Patient has no known allergies.  Social History Social History   Tobacco Use  . Smoking status: Former Smoker    Packs/day: 0.50    Years: 20.00    Pack years: 10.00    Types: Cigarettes  . Smokeless tobacco: Never Used  Substance Use Topics  . Alcohol use: No  . Drug use: No     Types: Marijuana    Comment: past   Review of Systems Constitutional: Negative for fever. Cardiovascular: Positive for chest pain Respiratory: Negative for shortness of breath. Gastrointestinal: Negative for abdominal pain, vomiting and diarrhea. Musculoskeletal: Positive for back pain Skin: Negative for rash. Neurological: Negative for headaches, focal weakness or numbness.  All systems negative/normal/unremarkable except as stated in the HPI  ____________________________________________   PHYSICAL EXAM:  VITAL SIGNS: ED Triage Vitals  Enc Vitals Group     BP 03/26/18 0557 (!) 211/136     Pulse Rate 03/26/18 0557 (!) 112     Resp 03/26/18 0557 18     Temp 03/26/18 0557 98 F (36.7 C)     Temp Source 03/26/18 0557 Oral     SpO2 03/26/18 0557 99 %     Weight 03/26/18 0558 182 lb (82.6 kg)     Height 03/26/18 0558 5\' 6"  (1.676 m)     Head Circumference --      Peak Flow --      Pain Score 03/26/18 0558 7     Pain Loc --      Pain Edu? --      Excl. in [place]? --    Constitutional: Alert and oriented. Well appearing and in no distress. Eyes: Conjunctivae are normal. Normal extraocular movements. ENT  Head: Normocephalic and atraumatic.      Nose: No congestion/rhinnorhea.      Mouth/Throat: Mucous membranes are moist.      Neck: No stridor. Cardiovascular: Normal rate, regular rhythm. No murmurs, rubs, or gallops. Respiratory: Normal respiratory effort without tachypnea nor retractions. Breath sounds are clear and equal bilaterally. No wheezes/rales/rhonchi. Gastrointestinal: Soft and nontender. Normal bowel sounds Musculoskeletal: Nontender with normal range of motion in extremities. No lower extremity tenderness nor edema. Neurologic:  Normal speech and language. No gross focal neurologic deficits are appreciated.  Skin:  Skin is warm, dry and intact. No rash noted. Psychiatric: Mood and affect are normal. Speech and behavior are normal.   ____________________________________________  EKG: Interpreted by me.  Sinus tachycardia with a rate of 106 bpm, normal PR interval, normal QRS, normal QT.  ____________________________________________  ED COURSE:  As part of my medical decision making, I reviewed the following data within the [place] History obtained from family if available, nursing notes, old chart and ekg, as well as notes from prior ED visits. Patient presented for nonspecific chest pain and hypertension, we will assess with labs and imaging as indicated at this time.   Procedures ____________________________________________   LABS (pertinent positives/negatives)  Labs Reviewed  BASIC METABOLIC PANEL - Abnormal; Notable for the following components:      Result Value   Sodium 130 (*)    Potassium 3.4 (*)    Chloride 91 (*)    Glucose, Bld 366 (*)    All other components within normal limits  CBC - Abnormal; Notable for the following components:   MCHC 37.1 (*)    All other components within normal limits  TROPONIN I - Abnormal; Notable for the following components:   Troponin I 0.04 (*)    All other components within normal limits    RADIOLOGY Chest x-ray IMPRESSION: Mild bilateral scarring noted. Lungs otherwise clear.  ____________________________________________   DIFFERENTIAL DIAGNOSIS   Unstable angina, hypertensive urgency, hypertensive emergency, dissection unlikely, diabetes  FINAL ASSESSMENT AND PLAN  Unstable angina, hypertensive urgency, hyperglycemia   Plan: The patient had presented for nonspecific chest pain. Patient's labs did indicate an elevated troponin although his prior troponins were elevated to .03. Patient's imaging reveal any acute process.  He presents markedly hypertensive here.  We did give him aspirin and nitroglycerin.  He is also an untreated diabetic.  The best plan is for readmission, possible heart cath but certainly better glycemic and blood  pressure control.   Laurence Aly, MD    Note: This note was generated in part or whole with voice recognition software. Voice recognition is usually quite accurate but there are transcription errors that can and very often do occur. I apologize for any typographical errors that were not detected and corrected.     Earleen Newport, MD 03/26/18 [phone]

## 2018-03-26 NOTE — ED Notes (Signed)
Jonni Sanger, pt advocate, at bedside talking with patient.

## 2018-03-26 NOTE — ED Notes (Signed)
Pt transported to 231.

## 2018-03-26 NOTE — H&P (Signed)
[facility] [place] NAME: Christian [name]    MR#:  [id number]  DATE OF BIRTH:  04/17/1970  DATE OF ADMISSION:  03/26/2018  PRIMARY CARE PHYSICIAN: Patient, No Pcp Per   REQUESTING/REFERRING PHYSICIAN: Earleen Newport, MD  CHIEF COMPLAINT:   Chief Complaint  Patient presents with  . Chest Pain    HISTORY OF PRESENT ILLNESS: Christian [name]  is a 48 y.o. male with a known history of coronary artery disease with previous stent, hypertension, medical noncompliance who was recently hospitalized and at that time underwent a stress test which was negative.  Cardiology recommended he be discharged home.  Was given assistance with discharge medications.  He was given prescriptions for diabetic medications as well as blood pressure medications he states that he did not take them.  Ending with complaint of left-sided chest pain.  Ongoing since discharge.  Progressively worse.  He also complains of pain going to his back. PAST MEDICAL HISTORY:   Past Medical History:  Diagnosis Date  . CAD (coronary artery disease)    a. 07/2013 NSTEMI/PCI: LCX 34m (4.0x23 Xience DES), RCA 73m, EF > 55%;  b. 07/2013 Echo: EF 60-65%, diast dysfxn, mild LVH, mildly dil LA, nl RV size/fxn, nl RVSP.  Marland Kitchen History of tobacco abuse    a. quit ~ 2010  . Hyperlipidemia   . Morbid obesity ([place])    a. weighed 168 @ age 52.    PAST SURGICAL HISTORY:  Past Surgical History:  Procedure Laterality Date  . ADENOIDECTOMY    . CARDIAC CATHETERIZATION  07/2013   [facility]'x1 stent    SOCIAL HISTORY:  Social History   Tobacco Use  . Smoking status: Former Smoker    Packs/day: 0.50    Years: 20.00    Pack years: 10.00    Types: Cigarettes  . Smokeless tobacco: Never Used  Substance Use Topics  . Alcohol use: No    FAMILY HISTORY:  Family History  Problem Relation Age of Onset  . Heart disease Mother   . Esophageal cancer Father     DRUG ALLERGIES: No Known Allergies  REVIEW OF  SYSTEMS:   CONSTITUTIONAL: No fever, fatigue or weakness.  EYES: No blurred or double vision.  EARS, NOSE, AND THROAT: No tinnitus or ear pain.  RESPIRATORY: No cough, shortness of breath, wheezing or hemoptysis.  CARDIOVASCULAR: Positive chest pain, orthopnea, edema.  GASTROINTESTINAL: No nausea, vomiting, diarrhea or abdominal pain.  GENITOURINARY: No dysuria, hematuria.  ENDOCRINE: No polyuria, nocturia,  HEMATOLOGY: No anemia, easy bruising or bleeding SKIN: No rash or lesion. MUSCULOSKELETAL: No joint pain or arthritis.   NEUROLOGIC: No tingling, numbness, weakness.  PSYCHIATRY: No anxiety or depression.   MEDICATIONS AT HOME:  Prior to Admission medications   Medication Sig Start Date End Date Taking? Authorizing Provider  acetaminophen (TYLENOL) 500 MG tablet Take 1,000 mg by mouth every 6 (six) hours as needed.   Yes [provider]  aspirin 81 MG chewable tablet Chew 1 tablet (81 mg total) by mouth daily. Patient not taking: Reported on 03/26/2018 01/01/18   Dustin Flock, MD  carvedilol (COREG) 25 MG tablet Take 1 tablet (25 mg total) by mouth 2 (two) times daily. Patient not taking: Reported on 03/26/2018 01/01/18 01/01/19  Dustin Flock, MD  glipiZIDE (GLUCOTROL) 10 MG tablet Take 1 tablet (10 mg total) by mouth 2 (two) times daily. Patient not taking: Reported on 03/26/2018 01/01/18 01/01/19  Dustin Flock, MD  losartan (COZAAR) 100  MG tablet Take 1 tablet (100 mg total) by mouth daily. Patient not taking: Reported on 03/26/2018 01/01/18 01/01/19  Dustin Flock, MD  losartan (COZAAR) 100 MG tablet Take 1 tablet (100 mg total) by mouth daily. Patient not taking: Reported on 03/26/2018 01/01/18   Dustin Flock, MD  metFORMIN (GLUCOPHAGE) 1000 MG tablet Take 1 tablet (1,000 mg total) by mouth 2 (two) times daily with a meal. 01/01/18   Dustin Flock, MD  rosuvastatin (CRESTOR) 10 MG tablet Take 1 tablet (10 mg total) by mouth daily. 01/01/18   Dustin Flock, MD       PHYSICAL EXAMINATION:   VITAL SIGNS: Blood pressure (!) 180/115, pulse 87, temperature 98 F (36.7 C), temperature source Oral, resp. rate 12, height 5\' 6"  (1.676 m), weight 82.6 kg, SpO2 93 %.  GENERAL:  48 y.o.-year-old patient lying in the bed with no acute distress.  EYES: Pupils equal, round, reactive to light and accommodation. No scleral icterus. Extraocular muscles intact.  HEENT: Head atraumatic, normocephalic. Oropharynx and nasopharynx clear.  NECK:  Supple, no jugular venous distention. No thyroid enlargement, no tenderness.  LUNGS: Normal breath sounds bilaterally, no wheezing, rales,rhonchi or crepitation. No use of accessory muscles of respiration.  CARDIOVASCULAR: S1, S2 normal. No murmurs, rubs, or gallops.  ABDOMEN: Soft, nontender, nondistended. Bowel sounds present. No organomegaly or mass.  EXTREMITIES: No pedal edema, cyanosis, or clubbing.  NEUROLOGIC: Cranial nerves II through XII are intact. Muscle strength 5/5 in all extremities. Sensation intact. Gait not checked.  PSYCHIATRIC: The patient is alert and oriented x 3.  SKIN: No obvious rash, lesion, or ulcer.   LABORATORY PANEL:   CBC Recent Labs  Lab 03/26/18 0602  WBC 5.8  HGB 15.6  HCT 42.0  PLT 193  MCV 86.8  MCH 32.2  MCHC 37.1*  RDW 11.9   ------------------------------------------------------------------------------------------------------------------  Chemistries  Recent Labs  Lab 03/26/18 0602  NA 130*  K 3.4*  CL 91*  CO2 25  GLUCOSE 366*  BUN 10  CREATININE 0.79  CALCIUM 9.2   ------------------------------------------------------------------------------------------------------------------ estimated creatinine clearance is 112.6 mL/min (by C-G formula based on SCr of 0.79 mg/dL). ------------------------------------------------------------------------------------------------------------------ No results for input(s): TSH, T4TOTAL, T3FREE, THYROIDAB in the last 72  hours.  Invalid input(s): FREET3   Coagulation profile No results for input(s): INR, PROTIME in the last 168 hours. ------------------------------------------------------------------------------------------------------------------- No results for input(s): DDIMER in the last 72 hours. -------------------------------------------------------------------------------------------------------------------  Cardiac Enzymes Recent Labs  Lab 03/26/18 0602  TROPONINI 0.04*   ------------------------------------------------------------------------------------------------------------------ Invalid input(s): POCBNP  ---------------------------------------------------------------------------------------------------------------  Urinalysis    Component Value Date/Time   COLORURINE Yellow 08/10/2013 1510   APPEARANCEUR Clear 08/10/2013 1510   LABSPEC 1.016 08/10/2013 1510   PHURINE 6.0 08/10/2013 1510   GLUCOSEU >=500 08/10/2013 1510   HGBUR Negative 08/10/2013 1510   BILIRUBINUR Negative 08/10/2013 1510   KETONESUR Negative 08/10/2013 1510   PROTEINUR Negative 08/10/2013 1510   NITRITE Negative 08/10/2013 1510   LEUKOCYTESUR Negative 08/10/2013 1510     RADIOLOGY: Dg Chest 2 View  Result Date: 03/26/2018 CLINICAL DATA:  Acute onset of left-sided chest pain, radiating to the upper back. EXAM: CHEST - 2 VIEW COMPARISON:  Chest radiograph and CTA of the chest performed 12/30/2017 FINDINGS: The lungs are well-aerated. Mild bilateral scarring is noted. Pulmonary vascularity is at the upper limits of normal. There is no evidence of pleural effusion or pneumothorax. The heart is normal in size; the mediastinal contour is within normal limits. No acute osseous abnormalities are seen. IMPRESSION: Mild bilateral  scarring noted. Lungs otherwise clear. Electronically Signed   By: Garald Balding M.D.   On: 03/26/2018 06:21    EKG: Orders placed or performed during the hospital encounter of 03/26/18   . EKG 12-Lead  . EKG 12-Lead  . ED EKG within 10 minutes  . ED EKG within 10 minutes    IMPRESSION AND PLAN: Patient is a 48 year old presenting with chest pain  1.  Chest pain with reported history of coronary artery disease In the setting of med noncompliance  we will place patient under observation I have notified cardiology of admission I suspect patient will keep returning to the emergency room with same complaints and likely needs a cardiac catheterization I will restart his aspirin we will start him on a beta-blocker  2.  Accelerated hypertension resume his Cozaar and a beta-blocker use IV hydralazine PRN  3.  Diabetes type 2 Place on sliding scale insulin hemoglobin A1c resume oral medications there were discharge on  4.  Daily alcohol use recommended patient stop drinking will monitor for any withdrawal symptoms  5.   Lovenox for DVT prophylaxis   All the records are reviewed and case discussed with ED provider. Management plans discussed with the patient, family and they are in agreement.  CODE STATUS: Code Status History    Date Active Date Inactive Code Status Order ID Comments User Context   12/30/2017 1110 01/01/2018 1514 Full Code [id number]  Epifanio Lesches, MD ED       TOTAL TIME TAKING CARE OF THIS PATIENT:55 minutes.    Dustin Flock M.D on 03/26/2018 at 8:56 AM  Between 7am to 6pm - Pager - [phone]  After 6pm go to [email or web address] - password [email or web address]  [employer] [facility]  [phone]  CC: Primary care physician; Patient, No Pcp Per

## 2018-03-26 NOTE — Consult Note (Signed)
Cardiology Consultation:   Patient ID: Christian [name] MRN: [id number]; DOB: 11-06-1970  Admit date: 03/26/2018 Date of Consult: 03/26/2018  Primary Care Provider: Patient, No Pcp Per Primary Cardiologist: Ida Rogue, MD  Primary Electrophysiologist:  None    Patient Profile:   Christian [name] is a 48 y.o. male with a hx of CAD and previous stent in the LCx 2015 (EF 60-65% 2015), DM2, HTN, HLD, current and heavy reported EtOH use, medical noncompliance, previous smoker (quit 2010), and morbid obesity who is being seen today for the evaluation of chest pain at the request of Dr. Posey Pronto.  History of Present Illness:   [name] with PMH as above and h/o CAD. 07/2017 Patient was admitted to [facility] May 2015 with chest pain that began after he was changing a light bulb in his oven, resulting in him getting shocked/electrocuted. He underwent subsequent PCI/DES to left circumflex performed d/t NSTEMI & discharged on aspirin, bb, &statin. Lasix was prescribed 2/2 diastolic dysfunction; however this was discontinued at his post hospital follow up on 08/18/13 as he did not appear to be volume overloaded and bb titrated accordingly. He then underwent post MI stress test and echo on 12/23/2013 that revealed an EF of 60-65% & no regional wall motion abnormalities: diast dysfxn, mild LVH, mildly dil LA, nl RV size/fxn, nl RVSP.  Seen in the office 11/2013. At that time, he was reportedly doing well and asx with plan for continued medical management. 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 He was instructed to follow-up with Dr. Rockey Situ in 6 months but was lost to follow-up as shortly thereafter he lost his job. Patient also reported he stopped taking his medications after losing his job as well, d/t cost without insurance.  Patient reports smoking 1/5 liquor 6 of 7  days weekly. He does not smoke or do any illegal drugs. He reported he can drink 1 gallon of liquor / vodka in 1 week and that he often drinks 1/2 pint a day.  12/30/17, patient presented to [facility] ED with atypical chest pain that was left sided and wrapped around his side. He was also found to have elevated blood pressure. Subsequent cardiac workup was negative for MI and lexiscan showed fixed inferior wall defect without ischemia but ruled suboptimal due to extracardiac uptake. TTE showed normal LVEF. It was thought that his elevated BP may be contributing to his symptoms and advice was for aggressive risk factor control and medical management of blood pressure including treatment with coreg and ACE/ARB.  11/20109: He was scheduled for follow-up in the office after discharge but the appointment was cancelled.   On 03/26/2017, patient presented to [facility] ED with atypical chest pain and elevated BP. He described the pain as intermittent, sharp, progressive, and tender to palpation. He reported this intermittent atypical chest pain since before October 2019. He reported the pain as left sided and wrapping around the side and into the back. He reportedly was unable to sleep last night d/t the pain. He was unable to cite an aggravating symptoms but did state the pain was worse laying down and after drinking. Pain is stated as better sitting up and during drinking. He stated that his new year's resolution is to stop drinking and that he finished all the alcohol in the house  last night. He stated that the pain is worse when sleeping on the left side over the right side. He does report that the pain is only minimal with ibuprofen and tylenol; however, he  reported greater relief of pain with an old oxycodone script.   In the ED Vitals: 211/136, HR 112, RR 18, T 98.22F, SPO2 99% Labs: Sodium 130, potassium 3.4, glucose 366, creatinine 0.79 and close to baseline, calcium 9.2, troponin 0 0.04, WBC 5.8, hemoglobin 15.6,  hematocrit 42.0, platelets 193 LEX:NTZGY rhythm, 111 bpm, possible left atrial enlargement, IVCD/LPFB, inferior changes noted on previous EKG / repolarization abnormalities noted in the anterolateral leads of the ER CXR: Mild bilateral scarring noted Meds: ASA, nitro   Past Medical History:  Diagnosis Date  . CAD (coronary artery disease)    a. 07/2013 NSTEMI/PCI: LCX 38m (4.0x23 Xience DES), RCA 53m, EF > 55%;  b. 07/2013 Echo: EF 60-65%, diast dysfxn, mild LVH, mildly dil LA, nl RV size/fxn, nl RVSP.  Marland Kitchen History of tobacco abuse    a. quit ~ 2010  . Hyperlipidemia   . Morbid obesity ([place])    a. weighed 168 @ age 17.    Past Surgical History:  Procedure Laterality Date  . ADENOIDECTOMY    . CARDIAC CATHETERIZATION  07/2013   [facility]'x1 stent     Home Medications:  Prior to Admission medications   Medication Sig Start Date End Date Taking? Authorizing Provider  acetaminophen (TYLENOL) 500 MG tablet Take 1,000 mg by mouth every 6 (six) hours as needed.   Yes [provider]  aspirin 81 MG chewable tablet Chew 1 tablet (81 mg total) by mouth daily. Patient not taking: Reported on 03/26/2018 01/01/18   Dustin Flock, MD  carvedilol (COREG) 25 MG tablet Take 1 tablet (25 mg total) by mouth 2 (two) times daily. Patient not taking: Reported on 03/26/2018 01/01/18 01/01/19  Dustin Flock, MD  glipiZIDE (GLUCOTROL) 10 MG tablet Take 1 tablet (10 mg total) by mouth 2 (two) times daily. Patient not taking: Reported on 03/26/2018 01/01/18 01/01/19  Dustin Flock, MD  losartan (COZAAR) 100 MG tablet Take 1 tablet (100 mg total) by mouth daily. Patient not taking: Reported on 03/26/2018 01/01/18 01/01/19  Dustin Flock, MD  losartan (COZAAR) 100 MG tablet Take 1 tablet (100 mg total) by mouth daily. Patient not taking: Reported on 03/26/2018 01/01/18   Dustin Flock, MD  metFORMIN (GLUCOPHAGE) 1000 MG tablet Take 1 tablet (1,000 mg total) by mouth 2 (two) times daily with a meal. Patient  not taking: Reported on 03/26/2018 01/01/18   Dustin Flock, MD  rosuvastatin (CRESTOR) 10 MG tablet Take 1 tablet (10 mg total) by mouth daily. Patient not taking: Reported on 03/26/2018 01/01/18   Dustin Flock, MD    Inpatient Medications: Scheduled Meds: . insulin aspart  0-9 Units Subcutaneous TID WC   Continuous Infusions:  PRN Meds: hydrALAZINE  Allergies:   No Known Allergies  Social History:   Social History   Socioeconomic History  . Marital status: Single    Spouse name: Not on file  . Number of children: Not on file  . Years of education: Not on file  . Highest education level: Not on file  Occupational History  . Not on file  Social Needs  . Financial resource strain: Not on file  . Food insecurity:    Worry: Not on file    Inability: Not on file  . Transportation needs:    Medical: Not on file  Non-medical: Not on file  Tobacco Use  . Smoking status: Former Smoker    Packs/day: 0.50    Years: 20.00    Pack years: 10.00    Types: Cigarettes  . Smokeless tobacco: Never Used  Substance and Sexual Activity  . Alcohol use: No  . Drug use: No    Types: Marijuana    Comment: past  . Sexual activity: Not on file  Lifestyle  . Physical activity:    Days per week: Not on file    Minutes per session: Not on file  . Stress: Not on file  Relationships  . Social connections:    Talks on phone: Not on file    Gets together: Not on file    Attends religious service: Not on file    Active member of club or organization: Not on file    Attends meetings of clubs or organizations: Not on file    Relationship status: Not on file  . Intimate partner violence:    Fear of current or ex partner: Not on file    Emotionally abused: Not on file    Physically abused: Not on file    Forced sexual activity: Not on file  Other Topics Concern  . Not on file  Social History Narrative  . Not on file    Family History:    Family History  Problem Relation Age of  Onset  . Heart disease Mother   . Esophageal cancer Father      ROS:  Please see the history of present illness.  Review of Systems  Constitutional: Negative for chills, fever and weight loss.  Cardiovascular: Positive for chest pain. Negative for palpitations, orthopnea and claudication.       Atypical, ttp  Musculoskeletal: Positive for back pain and myalgias.       TTP and left sided chest pain that wraps around to the L back  All other systems reviewed and are negative.   All other ROS reviewed and negative.     Physical Exam/Data:   Vitals:   03/26/18 0730 03/26/18 0800 03/26/18 0815 03/26/18 0830  BP: (!) 193/122 (!) 176/118  (!) 180/115  Pulse: 100 (!) 108 (!) 102 87  Resp: 14 11 13 12   Temp:      TempSrc:      SpO2: 93% 92% 96% 93%  Weight:      Height:       No intake or output data in the 24 hours ending 03/26/18 1030 Filed Weights   03/26/18 0558  Weight: 82.6 kg   Body mass index is 29.38 kg/m.  General:  Well nourished, well developed, in slight distress HEENT: normal Neck: no JVD Vascular: No carotid bruits; FA pulses 2+ bilaterally without bruits  Cardiac:  normal S1, S2; RRR; no murmur  Lungs:  clear to auscultation bilaterally, no wheezing, rhonchi or rales  Abd: soft, nontender, no hepatomegaly  Ext: no b/l edema Musculoskeletal:  No deformities, BUE and BLE strength normal and equal Skin: warm and dry  Neuro:   no focal abnormalities noted Psych:  Normal affect   EKG: Refer to HPI Telemetry:  Telemetry was personally reviewed and demonstrates:  SR, 90s  CV Studies:   Relevant CV Studies: 12/31/17 TTE - Left ventricle: The cavity size was normal. Wall thickness was normal. Systolic function was normal. The estimated ejection fraction was in the range of 60% to 65%. Wall motion was normal; there were no regional wall motion abnormalities. Doppler parameters  are consistent with abnormal left ventricular relaxation (grade 1  diastolic dysfunction).  12/31/17 Lexiscan Pharmacological myocardial perfusion imaging study with no significant Ischemia Small region of fixed apical defect on attenuation corrected images No wall motion abnormality noted in this region Non-attenuation corrected images with diaphragmatic attenuation artifact but no apical defect Normal wall motion, EF estimated at 35%. GI uptake artifact noted which could contribute to artificially depressed ejection fraction estimate Grossly EKG changes concerning for ischemia at peak stress or in recovery. Significant motion artifact Low risk scan Consider echocardiogram to confirm ejection fraction  12/2013 TTE - Left ventricle: The cavity size was normal. Systolic function was normal. The estimated ejection fraction was in the range of 60% to 65%. Wall motion was normal; there were no regional wall motion abnormalities. Left ventricular diastolic function parameters were normal. - Left atrium: The atrium was normal in size. - Right ventricle: Systolic function was normal. - Pulmonary arteries: Systolic pressure was within the normal range.   Laboratory Data:  Chemistry Recent Labs  Lab 03/26/18 0602  NA 130*  K 3.4*  CL 91*  CO2 25  GLUCOSE 366*  BUN 10  CREATININE 0.79  CALCIUM 9.2  GFRNONAA >60  GFRAA >60  ANIONGAP 14    No results for input(s): PROT, ALBUMIN, AST, ALT, ALKPHOS, BILITOT in the last 168 hours. Hematology Recent Labs  Lab 03/26/18 0602  WBC 5.8  RBC 4.84  HGB 15.6  HCT 42.0  MCV 86.8  MCH 32.2  MCHC 37.1*  RDW 11.9  PLT 193   Cardiac Enzymes Recent Labs  Lab 03/26/18 0602  TROPONINI 0.04*   No results for input(s): TROPIPOC in the last 168 hours.  BNPNo results for input(s): BNP, PROBNP in the last 168 hours.  DDimer No results for input(s): DDIMER in the last 168 hours.  Radiology/Studies:  Dg Chest 2 View  Result Date: 03/26/2018 CLINICAL DATA:  Acute onset of left-sided  chest pain, radiating to the upper back. EXAM: CHEST - 2 VIEW COMPARISON:  Chest radiograph and CTA of the chest performed 12/30/2017 FINDINGS: The lungs are well-aerated. Mild bilateral scarring is noted. Pulmonary vascularity is at the upper limits of normal. There is no evidence of pleural effusion or pneumothorax. The heart is normal in size; the mediastinal contour is within normal limits. No acute osseous abnormalities are seen. IMPRESSION: Mild bilateral scarring noted. Lungs otherwise clear. Electronically Signed   By: Garald Balding M.D.   On: 03/26/2018 06:21    Assessment and Plan:   Atypical chest pain with h/o known CAD and in setting of uncontrolled HTN - Musculoskeletal left sided chest pain / atypical chest pain that is TTP on left side and around to pack around area of the 7th rib. Most recent EKG as above and consistent with previous EKG: sinus tachycardia with nonspecific ST/T wave changes in inferior leads. Previous Lexiscan with fixed inferior wall defect, no ischemia; previous echo with normal LVEF. Consider elevated BP as etiology of CP. Recommend aggressive treatment of risk factors including BP control, DM2, alcohol cessation, medication compliance, lifestyle changes.  - BMET. Cr 0.79. K 3.4. Recommend replete with goal 4.0, check Mg and follow-up labs as outpatient / with PCP. - Recommend continue PTA / previously prescribed ASA 81mg  Coreg 12.5mg  BID, ARB with Losartan 100mg  po qd at discharge.  Continue PTA statin with Crestor 10mg  daily. Continue SL nitro PRN for chest pain. Recommendations made for MSK pain treatment, including chiropractic outpatient therapy, heat/ice, and massage therapy.  Recommendations also made for alcohol cessation as below.   Troponin elevation - Minimally elevated, flat trending and likely d/t supply demand ischemia in the setting of elevated BP as below. No plans for further ischemic / invasive workup at this time given MSK / atypical chest pain as  above.   Hypokalemia - Recommend replete with goal 4.0. Check Mg. Follow-up BMET as outpatient.   HTN  - BP 175/111 - ConsiderHTNetiologies: alcohol induced v pain v. renal cyst as noted in CTA at previous admission.Recommend continue PTA medications / antihypertensives as above. Recommend PCP follow-up and alcohol cessation.   H/o hypertriglyceridemia - At risk for pancreatitis d/t hypertriglyceridemia, ? 2/2 alcoholism. Unable to calculate previous LDL d/t high triglycerides - Consider fibrates. Defer management and further workup per IM /PCP  Hypercholesterolemia  -  Recommend continue PTA statin as above : LDL 117 in 2015. Unable to calculate previous 12/2017 LDL d/t hypertriglyceridemia. Total cholesterol over 200 at 222. Could consider outpatient lipid clinic.  DM2, uncontrolled  - A1C 10.0 12/2017. Recommend PTA medication at discharge. Recommend PCP follow-up.  Alcohol abuse - As above in HPI. Cessation advised.   For questions or updates, please contact [place] Please consult [email or web address] for contact info under    Signed, Arvil Chaco, PA-C  03/26/2018 10:30 AM

## 2018-03-26 NOTE — ED Notes (Signed)
Pt given meal tray.

## 2018-03-26 NOTE — ED Notes (Signed)
Pt left the floor at 1843.  This RN verified and was told okay to bring patient up at this time.

## 2018-03-27 LAB — GLUCOSE, CAPILLARY
Glucose-Capillary: 326 mg/dL — ABNORMAL HIGH (ref 70–99)
Glucose-Capillary: 347 mg/dL — ABNORMAL HIGH (ref 70–99)

## 2018-03-27 LAB — BASIC METABOLIC PANEL
Anion gap: 13 (ref 5–15)
BUN: 21 mg/dL — ABNORMAL HIGH (ref 6–20)
CO2: 25 mmol/L (ref 22–32)
Calcium: 8.7 mg/dL — ABNORMAL LOW (ref 8.9–10.3)
Chloride: 95 mmol/L — ABNORMAL LOW (ref 98–111)
Creatinine, Ser: 0.82 mg/dL (ref 0.61–1.24)
GFR calc non Af Amer: >60 mL/min (ref >60)
Glucose, Bld: 216 mg/dL — ABNORMAL HIGH (ref 70–99)
Potassium: 2.9 mmol/L — ABNORMAL LOW (ref 3.5–5.1)
Sodium: 133 mmol/L — ABNORMAL LOW (ref 135–145)

## 2018-03-27 LAB — TROPONIN I: Troponin I: 0.03 ng/mL (ref <0.03)

## 2018-03-27 MED ORDER — CARVEDILOL 25 MG PO TABS: 25.0000 mg | ORAL_TABLET | Freq: Two times a day (BID) | ORAL | 11 refills | Status: DC

## 2018-03-27 MED ORDER — POTASSIUM CHLORIDE CRYS ER 20 MEQ PO TBCR
40.0000 meq | EXTENDED_RELEASE_TABLET | Freq: Two times a day (BID) | ORAL | Status: AC
  Administered 2018-03-27 (×2): 40 meq via ORAL
  Filled 2018-03-27 (×2): qty 2

## 2018-03-27 MED ORDER — GLIPIZIDE 10 MG PO TABS: 10.0000 mg | ORAL_TABLET | Freq: Two times a day (BID) | ORAL | 11 refills | Status: DC

## 2018-03-27 MED ORDER — ROSUVASTATIN CALCIUM 10 MG PO TABS: 10.0000 mg | ORAL_TABLET | Freq: Every day | ORAL | 0 refills | Status: DC

## 2018-03-27 MED ORDER — POTASSIUM CHLORIDE CRYS ER 20 MEQ PO TBCR: 20.0000 meq | EXTENDED_RELEASE_TABLET | Freq: Every day | ORAL | 0 refills | Status: DC

## 2018-03-27 MED ORDER — CARISOPRODOL 350 MG PO TABS: 350.0000 mg | ORAL_TABLET | Freq: Three times a day (TID) | ORAL | 1 refills | Status: DC | PRN

## 2018-03-27 MED ORDER — IBUPROFEN 400 MG PO TABS: 400.0000 mg | ORAL_TABLET | Freq: Four times a day (QID) | ORAL | 0 refills | Status: DC | PRN

## 2018-03-27 MED ORDER — LOSARTAN POTASSIUM 100 MG PO TABS: 100.0000 mg | ORAL_TABLET | Freq: Every day | ORAL | 11 refills | Status: DC

## 2018-03-27 MED ORDER — METFORMIN HCL 1000 MG PO TABS: 1000.0000 mg | ORAL_TABLET | Freq: Two times a day (BID) | ORAL | 0 refills | Status: DC

## 2018-03-27 NOTE — Plan of Care (Signed)
  Problem: Education: Goal: Knowledge of General Education information will improve Description Including pain rating scale, medication(s)/side effects and non-pharmacologic comfort measures Outcome: Progressing   Problem: Health Behavior/Discharge Planning: Goal: Ability to manage health-related needs will improve Outcome: Progressing   Problem: Clinical Measurements: Goal: Ability to maintain clinical measurements within normal limits will improve Outcome: Progressing Goal: Will remain free from infection Outcome: Progressing Goal: Diagnostic test results will improve Outcome: Progressing Goal: Respiratory complications will improve Outcome: Progressing Goal: Cardiovascular complication will be avoided Outcome: Progressing   Problem: Nutrition: Goal: Adequate nutrition will be maintained Outcome: Progressing   Problem: Coping: Goal: Level of anxiety will decrease Outcome: Progressing   Problem: Safety: Goal: Ability to remain free from injury will improve Outcome: Progressing   Problem: Skin Integrity: Goal: Risk for impaired skin integrity will decrease Outcome: Progressing   Problem: Education: Goal: Understanding of cardiac disease, CV risk reduction, and recovery process will improve Outcome: Progressing Goal: Individualized Educational Video(s) Outcome: Progressing   Problem: Activity: Goal: Ability to tolerate increased activity will improve Outcome: Progressing   Problem: Cardiac: Goal: Ability to achieve and maintain adequate cardiovascular perfusion will improve Outcome: Progressing   Problem: Health Behavior/Discharge Planning: Goal: Ability to safely manage health-related needs after discharge will improve Outcome: Progressing

## 2018-03-27 NOTE — Discharge Summary (Signed)
[place] at [facility] [place], [place] y.o., DOB [birth year], MRN [id number]. Admission date: 03/26/2018 Discharge Date 03/27/2018 Primary MD Patient, No Pcp Per Admitting Physician Dustin Flock, MD  Admission Diagnosis  Hyperglycemia [R73.9] Hypertensive urgency [I16.0] Nonspecific chest pain [R07.9]  Discharge Diagnosis   Active Problems: Noncardiac chest pain musculoskeletal in nature cardiology feels with the negative stress test patient does not need to be readmitted again if he comes to the ED Accelerated hypertension due to med noncompliance Diabetes with poor control due to med noncompliance Hyperlipidemia with medical noncompliance Daily alcohol use       Hospital Course Patient is 48 year old well-known to me from previous admission.  Patient was admitted in October with chest pain.  At that time he had a stress test which was negative.  He was discharged on oral medications for his blood pressure diabetes which she stopped taking within a month.  He comes back with complaint of reproducible chest pain.  Due to this he was admitted and placed under observation cardiology saw the patient and they did not feel they needed admit admission.  Due to his blood pressure being high blood sugar being high he was placed under observation.  I have asked case manager to help with his medications.  I have strongly recommended patient take his medications as prescribed.            Consults  cardiology  Significant Tests:  See full reports for all details     Dg Chest 2 View  Result Date: 03/26/2018 CLINICAL DATA:  Acute onset of left-sided chest pain, radiating to the upper back. EXAM: CHEST - 2 VIEW COMPARISON:  Chest radiograph and CTA of the chest performed 12/30/2017 FINDINGS: The lungs are well-aerated. Mild bilateral scarring is noted. Pulmonary vascularity is at the upper limits of normal. There is no evidence of pleural effusion or  pneumothorax. The heart is normal in size; the mediastinal contour is within normal limits. No acute osseous abnormalities are seen. IMPRESSION: Mild bilateral scarring noted. Lungs otherwise clear. Electronically Signed   By: Garald Balding M.D.   On: 03/26/2018 06:21       Today   Subjective:   [name] patient reports chest pain better  Objective:   Blood pressure 100/73, pulse 74, temperature 98.3 F (36.8 C), temperature source Oral, resp. rate 19, height 5\' 6"  (1.676 m), weight 82.6 kg, SpO2 96 %.  .  Intake/Output Summary (Last 24 hours) at 03/27/2018 1717 Last data filed at 03/27/2018 0700 Gross per 24 hour  Intake 419.6 ml  Output 1100 ml  Net -680.4 ml    Exam VITAL SIGNS: Blood pressure 100/73, pulse 74, temperature 98.3 F (36.8 C), temperature source Oral, resp. rate 19, height 5\' 6"  (1.676 m), weight 82.6 kg, SpO2 96 %.  GENERAL:  48 y.o.-year-old patient lying in the bed with no acute distress.  EYES: Pupils equal, round, reactive to light and accommodation. No scleral icterus. Extraocular muscles intact.  HEENT: Head atraumatic, normocephalic. Oropharynx and nasopharynx clear.  NECK:  Supple, no jugular venous distention. No thyroid enlargement, no tenderness.  LUNGS: Normal breath sounds bilaterally, no wheezing, rales,rhonchi or crepitation. No use of accessory muscles of respiration.  CARDIOVASCULAR: S1, S2 normal. No murmurs, rubs, or gallops.  ABDOMEN: Soft, nontender, nondistended. Bowel sounds present. No organomegaly or mass.  EXTREMITIES: No pedal edema, cyanosis, or clubbing.  NEUROLOGIC: Cranial nerves II through XII are intact. Muscle strength 5/5 in all extremities.  Sensation intact. Gait not checked.  PSYCHIATRIC: The patient is alert and oriented x 3.  SKIN: No obvious rash, lesion, or ulcer.   Data Review     CBC w Diff:  Lab Results  Component Value Date   WBC 5.8 03/26/2018   HGB 15.6 03/26/2018   HGB 15.4 08/10/2013   HCT 42.0  03/26/2018   HCT 44.6 08/10/2013   PLT 193 03/26/2018   PLT 260 08/10/2013   LYMPHOPCT 57.7 07/12/2011   MONOPCT 5.7 07/12/2011   EOSPCT 3.2 07/12/2011   BASOPCT 0.8 07/12/2011   CMP:  Lab Results  Component Value Date   NA 133 (L) 03/27/2018   NA 136 08/12/2013   K 2.9 (L) 03/27/2018   K 3.8 08/12/2013   CL 95 (L) 03/27/2018   CL 104 08/12/2013   CO2 25 03/27/2018   CO2 26 08/12/2013   BUN 21 (H) 03/27/2018   BUN 9 08/12/2013   CREATININE 0.82 03/27/2018   CREATININE 0.95 08/12/2013   PROT 7.3 12/30/2017   PROT 7.0 12/15/2013   PROT 7.8 08/10/2013   ALBUMIN 4.3 12/30/2017   ALBUMIN 4.7 12/15/2013   ALBUMIN 4.2 08/10/2013   BILITOT 1.4 (H) 12/30/2017   BILITOT 1.2 (H) 08/10/2013   ALKPHOS 62 12/30/2017   ALKPHOS 83 08/10/2013   AST 15 12/30/2017   AST 37 08/10/2013   ALT 16 12/30/2017   ALT 36 08/10/2013  .  Micro Results No results found for this or any previous visit (from the past 240 hour(s)).   Code Status History    Date Active Date Inactive Code Status Order ID Comments User Context   03/26/2018 1538 03/27/2018 1616 Full Code [id number]  Dustin Flock, MD ED   12/30/2017 1110 01/01/2018 1514 Full Code [id number]  Epifanio Lesches, MD ED          Follow-up Information    Rise Mu, PA-C On 04/07/2018.   Specialties:  Physician Assistant, Cardiology, Radiology Why:  Appointment Time: @ 11:30 Contact information: [address] [place] [address] [phone]        [place].   Specialty:  Primary Care Why:  Appointment Time: walk in only Contact information: [address] [place] [address] [phone]          Discharge Medications   Allergies as of 03/27/2018   No Known Allergies     Medication List    TAKE these medications   acetaminophen 500 MG tablet Commonly known as:  TYLENOL Take 1,000 mg by mouth every 6 (six) hours as needed.   aspirin  81 MG chewable tablet Chew 1 tablet (81 mg total) by mouth daily.   carisoprodol 350 MG tablet Commonly known as:  SOMA Take 1 tablet (350 mg total) by mouth 3 (three) times daily as needed for muscle spasms.   carvedilol 25 MG tablet Commonly known as:  COREG Take 1 tablet (25 mg total) by mouth 2 (two) times daily.   glipiZIDE 10 MG tablet Commonly known as:  GLUCOTROL Take 1 tablet (10 mg total) by mouth 2 (two) times daily.   ibuprofen 400 MG tablet Commonly known as:  ADVIL,MOTRIN Take 1 tablet (400 mg total) by mouth every 6 (six) hours as needed for moderate pain.   losartan 100 MG tablet Commonly known as:  COZAAR Take 1 tablet (100 mg total) by mouth daily. What changed:  Another medication with the same name was removed.  Continue taking this medication, and follow the directions you see here.   metFORMIN 1000 MG tablet Commonly known as:  GLUCOPHAGE Take 1 tablet (1,000 mg total) by mouth 2 (two) times daily with a meal.   potassium chloride SA 20 MEQ tablet Commonly known as:  K-DUR,KLOR-CON Take 1 tablet (20 mEq total) by mouth daily for 3 days.   rosuvastatin 10 MG tablet Commonly known as:  CRESTOR Take 1 tablet (10 mg total) by mouth daily.          Total Time in preparing paper work, data evaluation and todays exam - 37 minutes  Dustin Flock M.D on 03/27/2018 at 5:17 [place]  [phone]

## 2018-03-27 NOTE — Care Management Note (Signed)
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  He was placed in observation for chest pain.  Discharging today.  He was here a couple of months ago and was set up with [facility] as he does not have insurance.  He did not follow through with requirements for [facility] and was unable to refill his medications and ran out.  This RNCM faxed prescriptions to [facility] and spoke with Velva Harman.  They will fill prescriptions again for 1 month and assist him with application.  This RN provided a new application to patient and a booklet of health clinics that he can use to establish a PCP.  He plans to drive himself home today.  He is independent in all adl's.  No further needs identified.               Action/Plan:   Expected Discharge Date:  03/27/18               Expected Discharge Plan:  Home/Self Care  In-House Referral:     Discharge planning Services  CM Consult, Medication Assistance, [facility]  Post Acute Care Choice:    Choice offered to:     DME Arranged:    DME Agency:     HH Arranged:    HH Agency:     Status of Service:  Completed, signed off  If discussed at [employer], dates discussed:    Additional Comments:  Elza Rafter, RN 03/27/2018, 9:56 AM

## 2018-03-27 NOTE — Progress Notes (Signed)
Dr. Toni Arthurs was notified of change in patient orientation status. Patient was disoriented to place time or situation and was incomprehensible. Dr. Charletta Cousin was at patient's bedside. Blood glucose was checked and patient blood glucose was 411. Per order 10 units of novolog was administered. Will continue to monitor.

## 2018-03-27 NOTE — Progress Notes (Signed)
Patient blood glucose was rechecked, and it was 290, scheduled glipizide was administered. Patient is now A&O X4.  Normal saline at 65mL started per order. Patient was able to complete his admission  documentation  Will continue to monitor.

## 2018-04-03 NOTE — Progress Notes (Deleted)
Cardiology Office Note Date:  04/03/2018  Patient ID:  Christian, [name] 25-Aug-1970, MRN [id number] PCP:  Patient, No Pcp Per  Cardiologist:  Dr. Rockey Situ, MD  ***refresh   Chief Complaint: ***  History of Present Illness: Christian [name] is a 48 y.o. male with history of CAD status post PCI/DES to the LCx and 07/2013, poorly controlled DM 2, HTN, HLD, morbid obesity, and tobacco abuse who presents for ***.  Patient was admitted to the hospital in 07/2013 with chest with stuttering chest pain that began after changing a light bulb in his oven that resulted in him getting shocked.  Troponin was elevated at 15.  He underwent cardiac cath that showed mid LCx 99% stenosis, mid RCA 40% stenosis.  He underwent successful PCI/DES to the mid LCx with 0% residual stenosis.  EF estimated at 55%.  He was last seen in the office on 12/15/2013 and was doing reasonably well.  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  Treadmill stress test showed achieved 94%, and no significant EKG changes concerning for ischemia.  He was admitted to the hospital in 12/2017 with chest pain that was worse when laying down and improved with sitting up.  Troponin peaked at 0.032.  BP was elevated at 212/115.  EKG showed sinus tachycardia with nonspecific ST-T changes in the inferior leads.  CT a of the chest/abdomen/pelvis was negative for aortic dissection or aneurysm.  Echo on 12/31/2017 showed an EF of 60 to 65%, normal wall motion, grade 1 diastolic dysfunction.  Lexiscan Myoview showed no significant ischemia, small region of fixed apical defect on attenuation corrected images, no wall motion abnormality in that region.  EF estimated at 35% with GI uptake artifact noted.  Low risk scan.  He did not follow-up as an outpatient.  He was  again admitted from 03/26/2018 through 03/27/2018 with chest pain radiating to his left flank and into his back.  Troponin peaked at 0.043.  No significant EKG changes were noted.  He reported this pain dated back to his admission and 12/2017 and was getting worse.  It was felt his chest pain was atypical.  Labs: 03/2018 - TSH 2.168, A1c 9.2, potassium 2.9, glucose 216, serum creatinine 0.82,, Hgb 15.6 12/2017 - TC 222, TG 660, LDL unable to be calculated, AST/ALT normal  ***  Past Medical History:  Diagnosis Date  . CAD (coronary artery disease)    a. 07/2013 NSTEMI/PCI: LCX 37m (4.0x23 Xience DES), RCA 73m, EF > 55%;  b. 07/2013 Echo: EF 60-65%, diast dysfxn, mild LVH, mildly dil LA, nl RV size/fxn, nl RVSP.  Marland Kitchen History of tobacco abuse    a. quit ~ 2010  . Hyperlipidemia   . Morbid obesity ([place])    a. weighed 168 @ age 48.    Past Surgical History:  Procedure Laterality Date  . ADENOIDECTOMY    . CARDIAC CATHETERIZATION  07/2013   [facility]'x1 stent    No outpatient medications have been marked as taking for the 04/07/18 encounter (Appointment) with Rise Mu, PA-C.    Allergies:   Patient has no known allergies.   Social History:  The patient  reports that he has quit smoking. His smoking use included cigarettes. He has a 10.00 pack-year smoking history. He has never used  smokeless tobacco. He reports that he does not drink alcohol or use drugs.   Family History:  The patient's family history includes Esophageal cancer in his father; Heart disease in his mother.  ROS:   ROS   PHYSICAL EXAM: *** VS:  There were no vitals taken for this visit. BMI: There is no height or weight on file to calculate BMI.  Physical Exam   EKG:  Was ordered and interpreted by me today. Shows ***  Recent Labs: 12/30/2017: ALT 16 12/31/2017: Magnesium 2.0 03/26/2018: Hemoglobin 15.6; Platelets 193; TSH 2.168 03/27/2018: BUN 21; Creatinine, Ser 0.82; Potassium 2.9; Sodium 133  12/31/2017: Cholesterol 222;  HDL 34; LDL Cholesterol UNABLE TO CALCULATE IF TRIGLYCERIDE OVER 400 mg/dL; Total CHOL/HDL Ratio 6.5; Triglycerides 660; VLDL UNABLE TO CALCULATE IF TRIGLYCERIDE OVER 400 mg/dL   Estimated Creatinine Clearance: 109.9 mL/min (by C-G formula based on SCr of 0.82 mg/dL).   Wt Readings from Last 3 Encounters:  03/26/18 182 lb (82.6 kg)  01/01/18 183 lb 9.6 oz (83.3 kg)  12/23/13 199 lb 12 oz (90.6 kg)     Other studies reviewed: Additional studies/records reviewed today include: summarized above  ASSESSMENT AND PLAN:  1. ***  Disposition: F/u with Dr. Rockey Situ or an APP in ***  Current medicines are reviewed at length with the patient today.  The patient did not have any concerns regarding medicines.  Signed, Christell Faith, PA-C 04/03/2018 11:13 AM     [place] [place], [place] [address] [phone]

## 2018-04-07 ENCOUNTER — Ambulatory Visit: Payer: Self-pay | Admitting: Physician Assistant

## 2018-04-08 ENCOUNTER — Encounter: Payer: Self-pay | Admitting: Physician Assistant

## 2018-05-08 ENCOUNTER — Other Ambulatory Visit: Payer: Self-pay

## 2018-05-08 ENCOUNTER — Emergency Department: Payer: Self-pay

## 2018-05-08 ENCOUNTER — Inpatient Hospital Stay
Admission: EM | Admit: 2018-05-08 | Discharge: 2018-05-11 | DRG: 637 | Disposition: A | Discharge status: Home / Self Care | Payer: Self-pay | Attending: Internal Medicine | Admitting: Internal Medicine

## 2018-05-08 ENCOUNTER — Encounter: Payer: Self-pay | Admitting: Emergency Medicine

## 2018-05-08 DIAGNOSIS — D6489 Other specified anemias: Secondary | ICD-10-CM | POA: Diagnosis present

## 2018-05-08 DIAGNOSIS — E1110 Type 2 diabetes mellitus with ketoacidosis without coma: Principal | ICD-10-CM | POA: Diagnosis present

## 2018-05-08 DIAGNOSIS — E871 Hypo-osmolality and hyponatremia: Secondary | ICD-10-CM | POA: Diagnosis present

## 2018-05-08 DIAGNOSIS — D473 Essential (hemorrhagic) thrombocythemia: Secondary | ICD-10-CM | POA: Diagnosis present

## 2018-05-08 DIAGNOSIS — R7989 Other specified abnormal findings of blood chemistry: Secondary | ICD-10-CM | POA: Diagnosis present

## 2018-05-08 DIAGNOSIS — Z955 Presence of coronary angioplasty implant and graft: Secondary | ICD-10-CM

## 2018-05-08 DIAGNOSIS — E860 Dehydration: Secondary | ICD-10-CM | POA: Diagnosis present

## 2018-05-08 DIAGNOSIS — Z7982 Long term (current) use of aspirin: Secondary | ICD-10-CM

## 2018-05-08 DIAGNOSIS — E1100 Type 2 diabetes mellitus with hyperosmolarity without nonketotic hyperglycemic-hyperosmolar coma (NKHHC): Secondary | ICD-10-CM | POA: Diagnosis present

## 2018-05-08 DIAGNOSIS — G9341 Metabolic encephalopathy: Secondary | ICD-10-CM | POA: Diagnosis present

## 2018-05-08 DIAGNOSIS — Z794 Long term (current) use of insulin: Secondary | ICD-10-CM

## 2018-05-08 DIAGNOSIS — I10 Essential (primary) hypertension: Secondary | ICD-10-CM | POA: Diagnosis present

## 2018-05-08 DIAGNOSIS — I252 Old myocardial infarction: Secondary | ICD-10-CM

## 2018-05-08 DIAGNOSIS — Z9119 Patient's noncompliance with other medical treatment and regimen: Secondary | ICD-10-CM

## 2018-05-08 DIAGNOSIS — Z8249 Family history of ischemic heart disease and other diseases of the circulatory system: Secondary | ICD-10-CM

## 2018-05-08 DIAGNOSIS — I2510 Atherosclerotic heart disease of native coronary artery without angina pectoris: Secondary | ICD-10-CM | POA: Diagnosis present

## 2018-05-08 DIAGNOSIS — Z87891 Personal history of nicotine dependence: Secondary | ICD-10-CM

## 2018-05-08 DIAGNOSIS — Z7902 Long term (current) use of antithrombotics/antiplatelets: Secondary | ICD-10-CM

## 2018-05-08 DIAGNOSIS — F10188 Alcohol abuse with other alcohol-induced disorder: Secondary | ICD-10-CM | POA: Diagnosis present

## 2018-05-08 DIAGNOSIS — E878 Other disorders of electrolyte and fluid balance, not elsewhere classified: Secondary | ICD-10-CM | POA: Diagnosis present

## 2018-05-08 DIAGNOSIS — E785 Hyperlipidemia, unspecified: Secondary | ICD-10-CM | POA: Diagnosis present

## 2018-05-08 DIAGNOSIS — E876 Hypokalemia: Secondary | ICD-10-CM | POA: Diagnosis present

## 2018-05-08 DIAGNOSIS — G4700 Insomnia, unspecified: Secondary | ICD-10-CM | POA: Diagnosis present

## 2018-05-08 DIAGNOSIS — Z800 Family history of malignant neoplasm of digestive organs: Secondary | ICD-10-CM

## 2018-05-08 DIAGNOSIS — N179 Acute kidney failure, unspecified: Secondary | ICD-10-CM | POA: Diagnosis present

## 2018-05-08 DIAGNOSIS — Z79899 Other long term (current) drug therapy: Secondary | ICD-10-CM

## 2018-05-08 LAB — GLUCOSE, CAPILLARY
GLUCOSE-CAPILLARY: 563 mg/dL — AB (ref 70–99)
Glucose-Capillary: >600 mg/dL (ref 70–99)
Glucose-Capillary: >600 mg/dL (ref 70–99)
Glucose-Capillary: >600 mg/dL (ref 70–99)

## 2018-05-08 LAB — TROPONIN I: Troponin I: 0.04 ng/mL (ref <0.03)

## 2018-05-08 LAB — CBC WITH DIFFERENTIAL/PLATELET
Abs Immature Granulocytes: 1.28 10*3/uL — ABNORMAL HIGH (ref 0.00–0.07)
Basophils Absolute: 0.3 10*3/uL — ABNORMAL HIGH (ref 0.0–0.1)
Basophils Relative: 2 %
Eosinophils Absolute: 0 10*3/uL (ref 0.0–0.5)
Eosinophils Relative: 0 %
HCT: 45 % (ref 39.0–52.0)
Hemoglobin: 15.6 g/dL (ref 13.0–17.0)
Immature Granulocytes: 6 %
LYMPHS PCT: 15 %
Lymphs Abs: 3.4 10*3/uL (ref 0.7–4.0)
MCH: 32 pg (ref 26.0–34.0)
MCHC: 34.7 g/dL (ref 30.0–36.0)
MCV: 92.2 fL (ref 80.0–100.0)
MONOS PCT: 8 %
Monocytes Absolute: 1.7 10*3/uL — ABNORMAL HIGH (ref 0.1–1.0)
Neutro Abs: 15.4 10*3/uL — ABNORMAL HIGH (ref 1.7–7.7)
Neutrophils Relative %: 69 %
Platelets: 445 10*3/uL — ABNORMAL HIGH (ref 150–400)
RBC: 4.88 MIL/uL (ref 4.22–5.81)
RDW: 13.9 % (ref 11.5–15.5)
WBC: 22.2 10*3/uL — ABNORMAL HIGH (ref 4.0–10.5)
nRBC: 0.5 % — ABNORMAL HIGH (ref 0.0–0.2)

## 2018-05-08 LAB — COMPREHENSIVE METABOLIC PANEL
ALT: 20 U/L (ref 0–44)
AST: 30 U/L (ref 15–41)
Albumin: 4.6 g/dL (ref 3.5–5.0)
Alkaline Phosphatase: 104 U/L (ref 38–126)
BUN: 11 mg/dL (ref 6–20)
CO2: <7 mmol/L — ABNORMAL LOW (ref 22–32)
Calcium: 9.3 mg/dL (ref 8.9–10.3)
Chloride: 88 mmol/L — ABNORMAL LOW (ref 98–111)
Creatinine, Ser: 1.7 mg/dL — ABNORMAL HIGH (ref 0.61–1.24)
GFR calc Af Amer: 54 mL/min — ABNORMAL LOW (ref >60)
GFR calc non Af Amer: 46 mL/min — ABNORMAL LOW (ref >60)
GLUCOSE: 728 mg/dL — AB (ref 70–99)
Potassium: 3.1 mmol/L — ABNORMAL LOW (ref 3.5–5.1)
Sodium: 128 mmol/L — ABNORMAL LOW (ref 135–145)
Total Bilirubin: 1.9 mg/dL — ABNORMAL HIGH (ref 0.3–1.2)
Total Protein: 8.1 g/dL (ref 6.5–8.1)

## 2018-05-08 LAB — BLOOD GAS, VENOUS
Acid-base deficit: 25.9 mmol/L — ABNORMAL HIGH (ref 0.0–2.0)
BICARBONATE: 5.9 mmol/L — AB (ref 20.0–28.0)
O2 Saturation: 27.3 %
Patient temperature: 37
pCO2, Ven: 29 mmHg — ABNORMAL LOW (ref 44.0–60.0)
pH, Ven: 6.92 — CL (ref 7.250–7.430)
pO2, Ven: 33 mmHg (ref 32.0–45.0)

## 2018-05-08 LAB — LACTIC ACID, PLASMA: Lactic Acid, Venous: 4.7 mmol/L (ref 0.5–1.9)

## 2018-05-08 LAB — BETA-HYDROXYBUTYRIC ACID: Beta-Hydroxybutyric Acid: >8 mmol/L — ABNORMAL HIGH (ref 0.05–0.27)

## 2018-05-08 MED ORDER — SODIUM CHLORIDE 0.9 % IV SOLN
INTRAVENOUS | Status: DC
  Administered 2018-05-08: 23:00:00 via INTRAVENOUS

## 2018-05-08 MED ORDER — INSULIN REGULAR(HUMAN) IN NACL 100-0.9 UT/100ML-% IV SOLN
INTRAVENOUS | Status: DC
  Administered 2018-05-08: 5.4 [IU]/h via INTRAVENOUS
  Administered 2018-05-09: 7.4 [IU]/h via INTRAVENOUS
  Filled 2018-05-08 (×2): qty 100

## 2018-05-08 MED ORDER — ENOXAPARIN SODIUM 40 MG/0.4ML ~~LOC~~ SOLN: 40.0000 mg | SUBCUTANEOUS | Status: DC

## 2018-05-08 MED ORDER — SODIUM CHLORIDE 0.9 % IV SOLN: INTRAVENOUS | Status: DC

## 2018-05-08 MED ORDER — ROSUVASTATIN CALCIUM 10 MG PO TABS
10.0000 mg | ORAL_TABLET | Freq: Every day | ORAL | Status: DC
  Administered 2018-05-09 – 2018-05-10 (×2): 10 mg via ORAL
  Filled 2018-05-08 (×3): qty 1

## 2018-05-08 MED ORDER — INSULIN REGULAR BOLUS VIA INFUSION
0.0000 [IU] | Freq: Three times a day (TID) | INTRAVENOUS | Status: DC
  Filled 2018-05-08: qty 10

## 2018-05-08 MED ORDER — DEXTROSE-NACL 5-0.45 % IV SOLN: INTRAVENOUS | Status: DC

## 2018-05-08 MED ORDER — POTASSIUM CHLORIDE 10 MEQ/100ML IV SOLN
10.0000 meq | INTRAVENOUS | Status: DC
  Filled 2018-05-08 (×4): qty 100

## 2018-05-08 MED ORDER — CARVEDILOL 25 MG PO TABS
25.0000 mg | ORAL_TABLET | Freq: Two times a day (BID) | ORAL | Status: DC
  Administered 2018-05-09 (×2): 25 mg via ORAL
  Filled 2018-05-08: qty 2
  Filled 2018-05-08: qty 1

## 2018-05-08 MED ORDER — DEXTROSE 50 % IV SOLN: 25.0000 mL | INTRAVENOUS | Status: DC | PRN

## 2018-05-08 MED ORDER — INSULIN ASPART 100 UNIT/ML ~~LOC~~ SOLN
5.0000 [IU] | Freq: Once | SUBCUTANEOUS | Status: AC
  Administered 2018-05-08: 5 [IU] via INTRAVENOUS
  Filled 2018-05-08: qty 1

## 2018-05-08 MED ORDER — SODIUM CHLORIDE 0.9 % IV SOLN: Freq: Once | INTRAVENOUS | Status: DC

## 2018-05-08 MED ORDER — STERILE WATER FOR INJECTION IV SOLN
INTRAVENOUS | Status: AC
  Administered 2018-05-08 – 2018-05-09 (×2): via INTRAVENOUS
  Filled 2018-05-08 (×3): qty 850

## 2018-05-08 MED ORDER — SODIUM CHLORIDE 0.9 % IV SOLN: INTRAVENOUS | Status: AC

## 2018-05-08 MED ORDER — SODIUM CHLORIDE 0.9 % IV BOLUS
1000.0000 mL | Freq: Once | INTRAVENOUS | Status: AC
  Administered 2018-05-08: 1000 mL via INTRAVENOUS

## 2018-05-08 MED ORDER — ASPIRIN 81 MG PO CHEW
81.0000 mg | CHEWABLE_TABLET | Freq: Every day | ORAL | Status: DC
  Administered 2018-05-09 – 2018-05-10 (×2): 81 mg via ORAL
  Filled 2018-05-08 (×2): qty 1

## 2018-05-08 MED ORDER — DEXTROSE-NACL 5-0.45 % IV SOLN
INTRAVENOUS | Status: DC
  Administered 2018-05-09: 04:00:00 via INTRAVENOUS

## 2018-05-08 NOTE — ED Notes (Signed)
Patient transported to X-ray 

## 2018-05-08 NOTE — ED Notes (Signed)
This RN verified insulin drip rate on glucostabilizer with Gershon Mussel, Therapist, sports.

## 2018-05-08 NOTE — ED Provider Notes (Addendum)
[facility] Emergency Department Provider Note   ____________________________________________   First MD Initiated Contact with Patient 05/08/18 2003     (approximate)  I have reviewed the triage vital signs and the nursing notes.   HISTORY  Chief Complaint Shortness of Breath    HPI Christian [name] is a 48 y.o. male patient is been feeling ill for couple days got worse today.  Came in with mottled legs and belly.  Smells fruity and is breathing with Kussmaul's respirations.  Denies any fever says nothing hurts him.   Past Medical History:  Diagnosis Date  . CAD (coronary artery disease)    a. 07/2013 NSTEMI/PCI: LCX 72m (4.0x23 Xience DES), RCA 68m, EF > 55%;  b. 07/2013 Echo: EF 60-65%, diast dysfxn, mild LVH, mildly dil LA, nl RV size/fxn, nl RVSP.  Marland Kitchen History of tobacco abuse    a. quit ~ 2010  . Hyperlipidemia   . Morbid obesity ([place])    a. weighed 168 @ age 69.    Patient Active Problem List   Diagnosis Date Noted  . Hyperosmolar non-ketotic state in patient with type 2 diabetes mellitus ([place]) 05/08/2018  . Chest pain 12/30/2017  . CAD (coronary artery disease)   . HTN (hypertension)   . Hyperlipidemia   . Diabetes mellitus, type II ([place])   . Morbid obesity ([place])   . History of tobacco abuse     Past Surgical History:  Procedure Laterality Date  . ADENOIDECTOMY    . CARDIAC CATHETERIZATION  07/2013   [facility]'x1 stent    Prior to Admission medications   Medication Sig Start Date End Date Taking? Authorizing Provider  acetaminophen (TYLENOL) 500 MG tablet Take 1,000 mg by mouth every 6 (six) hours as needed.    [provider]  aspirin 81 MG chewable tablet Chew 1 tablet (81 mg total) by mouth daily. 05/11/18   Mayo, Pete Pelt, MD  carvedilol (COREG) 25 MG tablet Take 1 tablet (25 mg total) by mouth 2 (two) times daily. Patient not taking: Reported on 05/08/2018 03/27/18 03/27/19  Dustin Flock, MD  glipiZIDE (GLUCOTROL) 10 MG tablet  Take 1 tablet (10 mg total) by mouth 2 (two) times daily. Patient not taking: Reported on 05/08/2018 03/27/18 03/27/19  Dustin Flock, MD  losartan (COZAAR) 100 MG tablet Take 1 tablet (100 mg total) by mouth daily. Patient not taking: Reported on 05/08/2018 03/27/18 03/27/19  Dustin Flock, MD  metFORMIN (GLUCOPHAGE) 1000 MG tablet Take 1 tablet (1,000 mg total) by mouth 2 (two) times daily with a meal. Patient not taking: Reported on 05/08/2018 03/27/18   Dustin Flock, MD  rosuvastatin (CRESTOR) 10 MG tablet Take 1 tablet (10 mg total) by mouth daily. Patient not taking: Reported on 05/08/2018 03/27/18   Dustin Flock, MD    Allergies Patient has no known allergies.  Family History  Problem Relation Age of Onset  . Heart disease Mother   . Esophageal cancer Father     Social History Social History   Tobacco Use  . Smoking status: Former Smoker    Packs/day: 0.50    Years: 20.00    Pack years: 10.00    Types: Cigarettes  . Smokeless tobacco: Never Used  Substance Use Topics  . Alcohol use: No  . Drug use: No    Types: Marijuana    Comment: past    Review of Systems  Constitutional: No fever/chills Eyes: No visual changes. ENT: No sore throat. Cardiovascular: Denies chest pain. Respiratory:  Kussmaul's respirations Gastrointestinal: No abdominal pain.  No nausea, no vomiting.  No diarrhea.  No constipation. Genitourinary: Negative for dysuria. Musculoskeletal: Negative for back pain. Skin: Negative for rash. Neurological: Negative for headaches, focal weakness   ____________________________________________   PHYSICAL EXAM:  VITAL SIGNS: ED Triage Vitals [05/08/18 1804]  Enc Vitals Group     BP (!) 191/107     Pulse Rate (!) 132     Resp (!) 30     Temp      Temp src      SpO2 100 %     Weight 182 lb (82.6 kg)     Height 5\' 6"  (1.676 m)     Head Circumference      Peak Flow      Pain Score 0     Pain Loc      Pain Edu?      Excl. in [place]?      Constitutional: Awake but slightly groggy looks ill Eyes: Conjunctivae are normal. PERRL. EOMI. Head: Atraumatic. Nose: No congestion/rhinnorhea. Mouth/Throat: Mucous membranes are dryish.  Oropharynx non-erythematous. Neck: No stridor.  Cardiovascular: Normal rate, regular rhythm. Grossly normal heart sounds.  Good peripheral circulation. Respiratory: Kussmaul's respirations lungs are clear Gastrointestinal: Soft and nontender. No distention. No abdominal bruits. No CVA tenderness. Musculoskeletal: No lower extremity tenderness nor edema. Neurologic:  Normal speech and language. No gross focal neurologic deficits are appreciated.  Skin:  Skin is warm, dry and intact.  See HPI   ____________________________________________   LABS (all labs ordered are listed, but only abnormal results are displayed)  Labs Reviewed  TROPONIN I - Abnormal; Notable for the following components:      Result Value   Troponin I 0.04 (*)    All other components within normal limits  COMPREHENSIVE METABOLIC PANEL - Abnormal; Notable for the following components:   Sodium 128 (*)    Potassium 3.1 (*)    Chloride 88 (*)    CO2 <7 (*)    Glucose, Bld 728 (*)    Creatinine, Ser 1.70 (*)    Total Bilirubin 1.9 (*)    GFR calc non Af Amer 46 (*)    GFR calc Af Amer 54 (*)    All other components within normal limits  LACTIC ACID, PLASMA - Abnormal; Notable for the following components:   Lactic Acid, Venous 4.7 (*)    All other components within normal limits  LACTIC ACID, PLASMA - Abnormal; Notable for the following components:   Lactic Acid, Venous 3.0 (*)    All other components within normal limits  CBC WITH DIFFERENTIAL/PLATELET - Abnormal; Notable for the following components:   WBC 22.2 (*)    Platelets 445 (*)    nRBC 0.5 (*)    Neutro Abs 15.4 (*)    Monocytes Absolute 1.7 (*)    Basophils Absolute 0.3 (*)    Abs Immature Granulocytes 1.28 (*)    All other components within normal  limits  BLOOD GAS, VENOUS - Abnormal; Notable for the following components:   pH, Ven 6.92 (*)    pCO2, Ven 29 (*)    Bicarbonate 5.9 (*)    Acid-base deficit 25.9 (*)    All other components within normal limits  GLUCOSE, CAPILLARY - Abnormal; Notable for the following components:   Glucose-Capillary >600 (*)    All other components within normal limits  GLUCOSE, CAPILLARY - Abnormal; Notable for the following components:   Glucose-Capillary >600 (*)    All  other components within normal limits  GLUCOSE, CAPILLARY - Abnormal; Notable for the following components:   Glucose-Capillary >600 (*)    All other components within normal limits  BETA-HYDROXYBUTYRIC ACID - Abnormal; Notable for the following components:   Beta-Hydroxybutyric Acid >8.00 (*)    All other components within normal limits  GLUCOSE, CAPILLARY - Abnormal; Notable for the following components:   Glucose-Capillary 563 (*)    All other components within normal limits  BASIC METABOLIC PANEL - Abnormal; Notable for the following components:   Sodium 131 (*)    Potassium 3.0 (*)    CO2 8 (*)    Glucose, Bld 468 (*)    Creatinine, Ser 1.38 (*)    Calcium 7.9 (*)    GFR calc non Af Amer 60 (*)    Anion gap 23 (*)    All other components within normal limits  CALCIUM, IONIZED - Abnormal; Notable for the following components:   Calcium, Ionized, Serum 4.2 (*)    All other components within normal limits  HEMOGLOBIN A1C - Abnormal; Notable for the following components:   Hgb A1c MFr Bld 9.5 (*)    All other components within normal limits  URINALYSIS, ROUTINE W REFLEX MICROSCOPIC - Abnormal; Notable for the following components:   Color, Urine YELLOW (*)    APPearance HAZY (*)    Glucose, UA >=500 (*)    Hgb urine dipstick MODERATE (*)    Ketones, ur 80 (*)    Protein, ur 30 (*)    All other components within normal limits  BASIC METABOLIC PANEL - Abnormal; Notable for the following components:   Sodium 131 (*)     Potassium 2.8 (*)    CO2 11 (*)    Glucose, Bld 186 (*)    Calcium 7.5 (*)    Anion gap 19 (*)    All other components within normal limits  BASIC METABOLIC PANEL - Abnormal; Notable for the following components:   Sodium 129 (*)    Potassium 2.6 (*)    CO2 15 (*)    Glucose, Bld 179 (*)    Calcium 7.4 (*)    All other components within normal limits  BASIC METABOLIC PANEL - Abnormal; Notable for the following components:   Sodium 128 (*)    Potassium 3.1 (*)    CO2 18 (*)    Glucose, Bld 137 (*)    Calcium 7.2 (*)    All other components within normal limits  BASIC METABOLIC PANEL - Abnormal; Notable for the following components:   Sodium 130 (*)    Potassium 3.3 (*)    CO2 20 (*)    Glucose, Bld 138 (*)    Calcium 7.5 (*)    All other components within normal limits  BASIC METABOLIC PANEL - Abnormal; Notable for the following components:   Sodium 133 (*)    Potassium 3.4 (*)    CO2 17 (*)    Glucose, Bld 202 (*)    Calcium 7.7 (*)    All other components within normal limits  GLUCOSE, CAPILLARY - Abnormal; Notable for the following components:   Glucose-Capillary 441 (*)    All other components within normal limits  GLUCOSE, CAPILLARY - Abnormal; Notable for the following components:   Glucose-Capillary 481 (*)    All other components within normal limits  GLUCOSE, CAPILLARY - Abnormal; Notable for the following components:   Glucose-Capillary 354 (*)    All other components within normal limits  GLUCOSE,  CAPILLARY - Abnormal; Notable for the following components:   Glucose-Capillary 316 (*)    All other components within normal limits  TROPONIN I - Abnormal; Notable for the following components:   Troponin I 0.03 (*)    All other components within normal limits  TROPONIN I - Abnormal; Notable for the following components:   Troponin I 0.03 (*)    All other components within normal limits  GLUCOSE, CAPILLARY - Abnormal; Notable for the following components:    Glucose-Capillary 180 (*)    All other components within normal limits  GLUCOSE, CAPILLARY - Abnormal; Notable for the following components:   Glucose-Capillary 166 (*)    All other components within normal limits  GLUCOSE, CAPILLARY - Abnormal; Notable for the following components:   Glucose-Capillary 184 (*)    All other components within normal limits  GLUCOSE, CAPILLARY - Abnormal; Notable for the following components:   Glucose-Capillary 167 (*)    All other components within normal limits  GLUCOSE, CAPILLARY - Abnormal; Notable for the following components:   Glucose-Capillary 184 (*)    All other components within normal limits  CBC - Abnormal; Notable for the following components:   RBC 3.66 (*)    Hemoglobin 11.8 (*)    HCT 31.6 (*)    MCHC 37.3 (*)    All other components within normal limits  GLUCOSE, CAPILLARY - Abnormal; Notable for the following components:   Glucose-Capillary 185 (*)    All other components within normal limits  GLUCOSE, CAPILLARY - Abnormal; Notable for the following components:   Glucose-Capillary 194 (*)    All other components within normal limits  PHOSPHORUS - Abnormal; Notable for the following components:   Phosphorus <1.0 (*)    All other components within normal limits  GLUCOSE, CAPILLARY - Abnormal; Notable for the following components:   Glucose-Capillary 139 (*)    All other components within normal limits  GLUCOSE, CAPILLARY - Abnormal; Notable for the following components:   Glucose-Capillary 131 (*)    All other components within normal limits  GLUCOSE, CAPILLARY - Abnormal; Notable for the following components:   Glucose-Capillary 140 (*)    All other components within normal limits  GLUCOSE, CAPILLARY - Abnormal; Notable for the following components:   Glucose-Capillary 130 (*)    All other components within normal limits  COMPREHENSIVE METABOLIC PANEL - Abnormal; Notable for the following components:   Sodium 134 (*)     Potassium 3.1 (*)    CO2 21 (*)    Glucose, Bld 184 (*)    Calcium 7.9 (*)    Total Protein 5.9 (*)    Albumin 3.2 (*)    All other components within normal limits  PHOSPHORUS - Abnormal; Notable for the following components:   Phosphorus <1.0 (*)    All other components within normal limits  GLUCOSE, CAPILLARY - Abnormal; Notable for the following components:   Glucose-Capillary 215 (*)    All other components within normal limits  GLUCOSE, CAPILLARY - Abnormal; Notable for the following components:   Glucose-Capillary 220 (*)    All other components within normal limits  GLUCOSE, CAPILLARY - Abnormal; Notable for the following components:   Glucose-Capillary 190 (*)    All other components within normal limits  RENAL FUNCTION PANEL - Abnormal; Notable for the following components:   Potassium 3.0 (*)    CO2 20 (*)    Glucose, Bld 237 (*)    Calcium 7.3 (*)    Albumin 3.1 (*)  All other components within normal limits  GLUCOSE, CAPILLARY - Abnormal; Notable for the following components:   Glucose-Capillary 271 (*)    All other components within normal limits  CBC - Abnormal; Notable for the following components:   RBC 2.99 (*)    Hemoglobin 9.7 (*)    HCT 27.0 (*)    Platelets 127 (*)    All other components within normal limits  GLUCOSE, CAPILLARY - Abnormal; Notable for the following components:   Glucose-Capillary 207 (*)    All other components within normal limits  FERRITIN - Abnormal; Notable for the following components:   Ferritin 756 (*)    All other components within normal limits  IRON AND TIBC - Abnormal; Notable for the following components:   Iron 20 (*)    TIBC 241 (*)    Saturation Ratios 8 (*)    All other components within normal limits  RETICULOCYTES - Abnormal; Notable for the following components:   Retic Ct Pct 3.6 (*)    RBC. 3.16 (*)    Immature Retic Fract 21.4 (*)    All other components within normal limits  POTASSIUM - Abnormal;  Notable for the following components:   Potassium 3.2 (*)    All other components within normal limits  MAGNESIUM - Abnormal; Notable for the following components:   Magnesium 1.4 (*)    All other components within normal limits  GLUCOSE, CAPILLARY - Abnormal; Notable for the following components:   Glucose-Capillary 229 (*)    All other components within normal limits  GLUCOSE, CAPILLARY - Abnormal; Notable for the following components:   Glucose-Capillary 216 (*)    All other components within normal limits  CBC WITH DIFFERENTIAL/PLATELET - Abnormal; Notable for the following components:   RBC 3.14 (*)    Hemoglobin 10.1 (*)    HCT 28.5 (*)    All other components within normal limits  GLUCOSE, CAPILLARY - Abnormal; Notable for the following components:   Glucose-Capillary 214 (*)    All other components within normal limits  GLUCOSE, CAPILLARY - Abnormal; Notable for the following components:   Glucose-Capillary 163 (*)    All other components within normal limits  GLUCOSE, CAPILLARY - Abnormal; Notable for the following components:   Glucose-Capillary 129 (*)    All other components within normal limits  MRSA PCR SCREENING  MAGNESIUM  PHOSPHORUS  CREATININE, URINE, RANDOM  UREA NITROGEN, URINE  PROTEIN, URINE, RANDOM  NA AND K (SODIUM & POTASSIUM), RAND UR  URINE DRUG SCREEN, QUALITATIVE ([facility] ONLY)  PROCALCITONIN  PROCALCITONIN  PROCALCITONIN  VITAMIN B12  FOLATE  PHOSPHORUS  PATHOLOGIST SMEAR REVIEW   ____________________________________________  EKG EKG read interpreted by me shows sinus tachycardia rate of 116 rightward axis there is some ST segment depression laterally and T wave inversion inferiorly but these are present to some degree and an EKG from January.  ____________________________________________  [place]  ED MD interpretation: X-ray read by radiology reviewed by me looks normal  Official radiology report(s): No results  found.  ____________________________________________   PROCEDURES  Procedure(s) performed:   Procedures  Critical Care performed: Critical care time 35 minutes.  This includes repeatedly examining the patient discussing with the hospitalist.  ____________________________________________   INITIAL IMPRESSION / [place] / ED COURSE  Patient improving now he is awake and aler,t oriented we will get him on a glucose stabilizer he does have a bit of a bump in the troponin is probably due to strain from his tachycardia  that he has been having which is also come down some.  Patient is making urine his mottling is improved he actually stood up to go to the bathroom which is great.  We will continue monitoring his potassium and his glucose and get him in the hospital.       ____________________________________________   FINAL CLINICAL IMPRESSION(S) / ED DIAGNOSES  Final diagnoses:  Diabetic ketoacidosis without coma associated with type 2 diabetes mellitus [facility])     ED Discharge Orders         Ordered    aspirin 81 MG chewable tablet  Daily     05/11/18 0914    Increase activity slowly     05/11/18 0914    Diet - low sodium heart healthy     05/11/18 0914           Note:  This document was prepared using Dragon voice recognition software and may include unintentional dictation errors.    Nena Polio, MD 05/08/18 2129    Nena Polio, MD 05/22/18 [phone]

## 2018-05-08 NOTE — ED Triage Notes (Signed)
PT to ER with reports of shortness of breath that started around 3 this afternoon.  Pt unable to complete sentences.  States has been feeling poorly for last 3 days.  Pt with noted increased work of breathing.  Pt also reports n/v.

## 2018-05-09 ENCOUNTER — Other Ambulatory Visit: Payer: Self-pay

## 2018-05-09 ENCOUNTER — Inpatient Hospital Stay: Payer: Self-pay

## 2018-05-09 DIAGNOSIS — E1110 Type 2 diabetes mellitus with ketoacidosis without coma: Principal | ICD-10-CM

## 2018-05-09 DIAGNOSIS — E872 Acidosis: Secondary | ICD-10-CM

## 2018-05-09 DIAGNOSIS — E1100 Type 2 diabetes mellitus with hyperosmolarity without nonketotic hyperglycemic-hyperosmolar coma (NKHHC): Secondary | ICD-10-CM

## 2018-05-09 LAB — BASIC METABOLIC PANEL
ANION GAP: 11 (ref 5–15)
Anion gap: 23 — ABNORMAL HIGH (ref 5–15)
Anion gap: 19 — ABNORMAL HIGH (ref 5–15)
Anion gap: 15 (ref 5–15)
Anion gap: 11 (ref 5–15)
Anion gap: 14 (ref 5–15)
BUN: 10 mg/dL (ref 6–20)
BUN: 11 mg/dL (ref 6–20)
BUN: 10 mg/dL (ref 6–20)
BUN: 10 mg/dL (ref 6–20)
BUN: 9 mg/dL (ref 6–20)
BUN: 8 mg/dL (ref 6–20)
CHLORIDE: 102 mmol/L (ref 98–111)
CO2: 8 mmol/L — ABNORMAL LOW (ref 22–32)
CO2: 11 mmol/L — ABNORMAL LOW (ref 22–32)
CO2: 15 mmol/L — ABNORMAL LOW (ref 22–32)
CO2: 18 mmol/L — AB (ref 22–32)
CO2: 20 mmol/L — ABNORMAL LOW (ref 22–32)
CO2: 17 mmol/L — ABNORMAL LOW (ref 22–32)
CREATININE: 0.9 mg/dL (ref 0.61–1.24)
Calcium: 7.9 mg/dL — ABNORMAL LOW (ref 8.9–10.3)
Calcium: 7.5 mg/dL — ABNORMAL LOW (ref 8.9–10.3)
Calcium: 7.4 mg/dL — ABNORMAL LOW (ref 8.9–10.3)
Calcium: 7.2 mg/dL — ABNORMAL LOW (ref 8.9–10.3)
Calcium: 7.5 mg/dL — ABNORMAL LOW (ref 8.9–10.3)
Calcium: 7.7 mg/dL — ABNORMAL LOW (ref 8.9–10.3)
Chloride: 100 mmol/L (ref 98–111)
Chloride: 101 mmol/L (ref 98–111)
Chloride: 99 mmol/L (ref 98–111)
Chloride: 99 mmol/L (ref 98–111)
Chloride: 99 mmol/L (ref 98–111)
Creatinine, Ser: 1.38 mg/dL — ABNORMAL HIGH (ref 0.61–1.24)
Creatinine, Ser: 1.03 mg/dL (ref 0.61–1.24)
Creatinine, Ser: 0.99 mg/dL (ref 0.61–1.24)
Creatinine, Ser: 0.88 mg/dL (ref 0.61–1.24)
Creatinine, Ser: 0.9 mg/dL (ref 0.61–1.24)
GFR calc Af Amer: >60 mL/min (ref >60)
GFR calc Af Amer: >60 mL/min (ref >60)
GFR calc Af Amer: >60 mL/min (ref >60)
GFR calc Af Amer: >60 mL/min (ref >60)
GFR calc Af Amer: >60 mL/min (ref >60)
GFR calc non Af Amer: 60 mL/min — ABNORMAL LOW (ref >60)
GFR calc non Af Amer: >60 mL/min (ref >60)
GFR calc non Af Amer: >60 mL/min (ref >60)
GFR calc non Af Amer: >60 mL/min (ref >60)
GFR calc non Af Amer: >60 mL/min (ref >60)
GFR calc non Af Amer: >60 mL/min (ref >60)
Glucose, Bld: 468 mg/dL — ABNORMAL HIGH (ref 70–99)
Glucose, Bld: 186 mg/dL — ABNORMAL HIGH (ref 70–99)
Glucose, Bld: 179 mg/dL — ABNORMAL HIGH (ref 70–99)
Glucose, Bld: 137 mg/dL — ABNORMAL HIGH (ref 70–99)
Glucose, Bld: 138 mg/dL — ABNORMAL HIGH (ref 70–99)
Glucose, Bld: 202 mg/dL — ABNORMAL HIGH (ref 70–99)
Potassium: 3 mmol/L — ABNORMAL LOW (ref 3.5–5.1)
Potassium: 2.8 mmol/L — ABNORMAL LOW (ref 3.5–5.1)
Potassium: 2.6 mmol/L — CL (ref 3.5–5.1)
Potassium: 3.1 mmol/L — ABNORMAL LOW (ref 3.5–5.1)
Potassium: 3.3 mmol/L — ABNORMAL LOW (ref 3.5–5.1)
Potassium: 3.4 mmol/L — ABNORMAL LOW (ref 3.5–5.1)
SODIUM: 131 mmol/L — AB (ref 135–145)
SODIUM: 129 mmol/L — AB (ref 135–145)
SODIUM: 133 mmol/L — AB (ref 135–145)
Sodium: 131 mmol/L — ABNORMAL LOW (ref 135–145)
Sodium: 128 mmol/L — ABNORMAL LOW (ref 135–145)
Sodium: 130 mmol/L — ABNORMAL LOW (ref 135–145)

## 2018-05-09 LAB — GLUCOSE, CAPILLARY
GLUCOSE-CAPILLARY: 184 mg/dL — AB (ref 70–99)
Glucose-Capillary: 130 mg/dL — ABNORMAL HIGH (ref 70–99)
Glucose-Capillary: 131 mg/dL — ABNORMAL HIGH (ref 70–99)
Glucose-Capillary: 139 mg/dL — ABNORMAL HIGH (ref 70–99)
Glucose-Capillary: 140 mg/dL — ABNORMAL HIGH (ref 70–99)
Glucose-Capillary: 166 mg/dL — ABNORMAL HIGH (ref 70–99)
Glucose-Capillary: 167 mg/dL — ABNORMAL HIGH (ref 70–99)
Glucose-Capillary: 180 mg/dL — ABNORMAL HIGH (ref 70–99)
Glucose-Capillary: 184 mg/dL — ABNORMAL HIGH (ref 70–99)
Glucose-Capillary: 185 mg/dL — ABNORMAL HIGH (ref 70–99)
Glucose-Capillary: 194 mg/dL — ABNORMAL HIGH (ref 70–99)
Glucose-Capillary: 215 mg/dL — ABNORMAL HIGH (ref 70–99)
Glucose-Capillary: 220 mg/dL — ABNORMAL HIGH (ref 70–99)
Glucose-Capillary: 316 mg/dL — ABNORMAL HIGH (ref 70–99)
Glucose-Capillary: 354 mg/dL — ABNORMAL HIGH (ref 70–99)
Glucose-Capillary: 441 mg/dL — ABNORMAL HIGH (ref 70–99)
Glucose-Capillary: 481 mg/dL — ABNORMAL HIGH (ref 70–99)

## 2018-05-09 LAB — URINALYSIS, ROUTINE W REFLEX MICROSCOPIC
BILIRUBIN URINE: NEGATIVE
Bacteria, UA: NONE SEEN
Glucose, UA: >=500 mg/dL — AB
Ketones, ur: 80 mg/dL — AB
LEUKOCYTE UA: NEGATIVE
NITRITE: NEGATIVE
Protein, ur: 30 mg/dL — AB
SPECIFIC GRAVITY, URINE: 1.016 (ref 1.005–1.030)
pH: 5 (ref 5.0–8.0)

## 2018-05-09 LAB — CBC
HCT: 31.6 % — ABNORMAL LOW (ref 39.0–52.0)
Hemoglobin: 11.8 g/dL — ABNORMAL LOW (ref 13.0–17.0)
MCH: 32.2 pg (ref 26.0–34.0)
MCHC: 37.3 g/dL — ABNORMAL HIGH (ref 30.0–36.0)
MCV: 86.3 fL (ref 80.0–100.0)
Platelets: 190 10*3/uL (ref 150–400)
RBC: 3.66 MIL/uL — AB (ref 4.22–5.81)
RDW: 13.8 % (ref 11.5–15.5)
WBC: 7.8 10*3/uL (ref 4.0–10.5)
nRBC: 0 % (ref 0.0–0.2)

## 2018-05-09 LAB — URINE DRUG SCREEN, QUALITATIVE (ARMC ONLY)
AMPHETAMINES, UR SCREEN: NOT DETECTED
Barbiturates, Ur Screen: NOT DETECTED
Benzodiazepine, Ur Scrn: NOT DETECTED
Cannabinoid 50 Ng, Ur ~~LOC~~: NOT DETECTED
Cocaine Metabolite,Ur ~~LOC~~: NOT DETECTED
MDMA (Ecstasy)Ur Screen: NOT DETECTED
Methadone Scn, Ur: NOT DETECTED
Opiate, Ur Screen: NOT DETECTED
Phencyclidine (PCP) Ur S: NOT DETECTED
Tricyclic, Ur Screen: NOT DETECTED

## 2018-05-09 LAB — CREATININE, URINE, RANDOM: Creatinine, Urine: 31 mg/dL

## 2018-05-09 LAB — MRSA PCR SCREENING: MRSA by PCR: NEGATIVE

## 2018-05-09 LAB — PHOSPHORUS
Phosphorus: 2.6 mg/dL (ref 2.5–4.6)
Phosphorus: <1 mg/dL — CL (ref 2.5–4.6)

## 2018-05-09 LAB — LACTIC ACID, PLASMA: Lactic Acid, Venous: 3 mmol/L (ref 0.5–1.9)

## 2018-05-09 LAB — NA AND K (SODIUM & POTASSIUM), RAND UR
Potassium Urine: 21 mmol/L
Sodium, Ur: 59 mmol/L

## 2018-05-09 LAB — PROCALCITONIN: Procalcitonin: 3.98 ng/mL

## 2018-05-09 LAB — MAGNESIUM: Magnesium: 2 mg/dL (ref 1.7–2.4)

## 2018-05-09 LAB — PROTEIN, URINE, RANDOM: Total Protein, Urine: 104 mg/dL

## 2018-05-09 LAB — TROPONIN I
TROPONIN I: 0.03 ng/mL — AB (ref <0.03)
Troponin I: 0.03 ng/mL (ref <0.03)

## 2018-05-09 MED ORDER — INSULIN DETEMIR 100 UNIT/ML ~~LOC~~ SOLN
18.0000 [IU] | Freq: Two times a day (BID) | SUBCUTANEOUS | Status: DC
  Administered 2018-05-09: 18 [IU] via SUBCUTANEOUS
  Filled 2018-05-09 (×3): qty 0.18

## 2018-05-09 MED ORDER — KCL IN DEXTROSE-NACL 40-5-0.45 MEQ/L-%-% IV SOLN
INTRAVENOUS | Status: DC
  Administered 2018-05-09: 100 mL/h via INTRAVENOUS
  Administered 2018-05-09 – 2018-05-10 (×2): via INTRAVENOUS
  Filled 2018-05-09 (×6): qty 1000

## 2018-05-09 MED ORDER — POTASSIUM CHLORIDE CRYS ER 20 MEQ PO TBCR
40.0000 meq | EXTENDED_RELEASE_TABLET | ORAL | Status: AC
  Administered 2018-05-09 (×2): 40 meq via ORAL
  Filled 2018-05-09 (×2): qty 2

## 2018-05-09 MED ORDER — PANTOPRAZOLE SODIUM 40 MG IV SOLR
40.0000 mg | INTRAVENOUS | Status: DC
  Administered 2018-05-09 – 2018-05-11 (×3): 40 mg via INTRAVENOUS
  Filled 2018-05-09 (×3): qty 40

## 2018-05-09 MED ORDER — ADULT MULTIVITAMIN W/MINERALS CH
1.0000 | ORAL_TABLET | Freq: Every day | ORAL | Status: DC
  Administered 2018-05-09 – 2018-05-10 (×2): 1 via ORAL
  Filled 2018-05-09 (×2): qty 1

## 2018-05-09 MED ORDER — INSULIN ASPART 100 UNIT/ML ~~LOC~~ SOLN
2.0000 [IU] | SUBCUTANEOUS | Status: DC
  Administered 2018-05-09: 6 [IU] via SUBCUTANEOUS
  Administered 2018-05-09: 2 [IU] via SUBCUTANEOUS
  Administered 2018-05-10: 4 [IU] via SUBCUTANEOUS
  Administered 2018-05-10: 6 [IU] via SUBCUTANEOUS
  Filled 2018-05-09 (×4): qty 1

## 2018-05-09 MED ORDER — SODIUM PHOSPHATES 45 MMOLE/15ML IV SOLN
30.0000 mmol | Freq: Once | INTRAVENOUS | Status: AC
  Administered 2018-05-09: 30 mmol via INTRAVENOUS
  Filled 2018-05-09: qty 10

## 2018-05-09 MED ORDER — INSULIN ASPART 100 UNIT/ML ~~LOC~~ SOLN: 6.0000 [IU] | SUBCUTANEOUS | Status: DC

## 2018-05-09 MED ORDER — DEXTROSE 10 % IV SOLN: INTRAVENOUS | Status: DC | PRN

## 2018-05-09 MED ORDER — FOLIC ACID 1 MG PO TABS
1.0000 mg | ORAL_TABLET | Freq: Every day | ORAL | Status: DC
  Administered 2018-05-09 – 2018-05-10 (×2): 1 mg via ORAL
  Filled 2018-05-09 (×2): qty 1

## 2018-05-09 MED ORDER — PYRIDOXINE HCL 100 MG/ML IJ SOLN
100.0000 mg | Freq: Every day | INTRAMUSCULAR | Status: DC
  Administered 2018-05-10 – 2018-05-11 (×2): 100 mg via INTRAVENOUS
  Filled 2018-05-09 (×4): qty 1

## 2018-05-09 MED ORDER — POTASSIUM CHLORIDE CRYS ER 20 MEQ PO TBCR
40.0000 meq | EXTENDED_RELEASE_TABLET | ORAL | Status: AC
  Administered 2018-05-09: 40 meq via ORAL
  Filled 2018-05-09: qty 2

## 2018-05-09 MED ORDER — THIAMINE HCL 100 MG/ML IJ SOLN
Freq: Once | INTRAVENOUS | Status: DC
  Filled 2018-05-09: qty 1000

## 2018-05-09 MED ORDER — POTASSIUM CHLORIDE 10 MEQ/100ML IV SOLN
10.0000 meq | INTRAVENOUS | Status: AC
  Administered 2018-05-09 (×5): 10 meq via INTRAVENOUS
  Filled 2018-05-09 (×5): qty 100

## 2018-05-09 MED ORDER — CARVEDILOL 12.5 MG PO TABS
12.5000 mg | ORAL_TABLET | Freq: Two times a day (BID) | ORAL | Status: DC
  Administered 2018-05-10 (×2): 12.5 mg via ORAL
  Filled 2018-05-09 (×2): qty 1

## 2018-05-09 MED ORDER — DIAZEPAM 5 MG PO TABS
5.0000 mg | ORAL_TABLET | ORAL | Status: DC | PRN
  Administered 2018-05-09: 5 mg via ORAL
  Filled 2018-05-09 (×2): qty 3

## 2018-05-09 MED ORDER — VITAMIN B-1 100 MG PO TABS
100.0000 mg | ORAL_TABLET | Freq: Every day | ORAL | Status: DC
  Administered 2018-05-09 – 2018-05-11 (×3): 100 mg via ORAL
  Filled 2018-05-09 (×3): qty 1

## 2018-05-09 MED ORDER — ENOXAPARIN SODIUM 40 MG/0.4ML ~~LOC~~ SOLN
40.0000 mg | SUBCUTANEOUS | Status: DC
  Administered 2018-05-09 – 2018-05-10 (×2): 40 mg via SUBCUTANEOUS
  Filled 2018-05-09 (×2): qty 0.4

## 2018-05-09 NOTE — Progress Notes (Signed)
Pharmacy Electrolyte Monitoring Consult:  Pharmacy consulted to assist in monitoring and replacing electrolytes in this 48 y.o. male admitted on 05/08/2018 with Shortness of Breath   Labs:  Sodium (mmol/L)  Date Value  05/09/2018 128 (L)  08/12/2013 136   Potassium (mmol/L)  Date Value  05/09/2018 3.1 (L)  08/12/2013 3.8   Magnesium (mg/dL)  Date Value  05/09/2018 2.0   Phosphorus (mg/dL)  Date Value  05/09/2018 <1.0 (LL)   Calcium (mg/dL)  Date Value  05/09/2018 7.2 (L)   Calcium, Total (mg/dL)  Date Value  08/12/2013 8.8   Albumin (g/dL)  Date Value  05/08/2018 4.6  12/15/2013 4.7  08/10/2013 4.2    Assessment/Plan: Patient is in DKA as evidenced by pH < 7.0, bicarb < 15, beta hydroxy butyrate +, and BG > 250. On BMP patient has multiple electrolytes abnormalities including hyponatremia, hypokalemia, although mag phos WNL.  K+ > 4.0; keep Mg > 2.0, Phos 2.5 - 4.5  02/15 0850 K 2.6: D5 + 1/2NS at 100 ml/hr started at [place]. There is still an outstanding order for KCI 40 mEq PO x 2  0215  1307 K 3.1: hypokalemia slowly improving following previous supplementation. Continue  D5 + 1/2NS at 100 ml/hr  2/15 1307 Phos level of <1.0 and no supplementation ordered previously.  Since patient received oral KCl and has KCl in IVF with Hyponatremia will order Sodium phosphate 30 mmol IV once over 6 hours.  Will recheck Phos with morning labs.  will monitor electrolytes w/ q4h labs.  Evelena Asa, PharmD Clinical Pharmacist 05/09/2018

## 2018-05-09 NOTE — Progress Notes (Signed)
Pharmacy Electrolyte Monitoring Consult:  Pharmacy consulted to assist in monitoring and replacing electrolytes in this 48 y.o. male admitted on 05/08/2018 with Shortness of Breath   Labs:  Sodium (mmol/L)  Date Value  05/09/2018 131 (L)  08/12/2013 136   Potassium (mmol/L)  Date Value  05/09/2018 3.0 (L)  08/12/2013 3.8   Magnesium (mg/dL)  Date Value  05/09/2018 2.0   Phosphorus (mg/dL)  Date Value  05/09/2018 2.6   Calcium (mg/dL)  Date Value  05/09/2018 7.9 (L)   Calcium, Total (mg/dL)  Date Value  08/12/2013 8.8   Albumin (g/dL)  Date Value  05/08/2018 4.6  12/15/2013 4.7  08/10/2013 4.2    Assessment/Plan: Patient is in DKA as evidenced by pH < 7.0, bicarb < 15, beta hydroxy butyrate +, and BG > 250. On BMP patient has multiple electrolytes abnormalities including hyponatremia, hypokalemia, although mag phos WNL. Goal Na 135 - 145; K+ > 4.0; keep Mg > 2.0, Phos 2.5 - 4.5  Will administer KCI 10 mEq IV x 5 and will continue monitoring K w/ q4h labs. If potassium does not improve or gets worse will plan to add potassium in the fluids per NP's okay. Patient is currently running NS @ 100 ml/hr and sodium bicarb @ 100 ml/hr will continue to monitor.  Tobie Lords, PharmD, BCPS Clinical Pharmacist 05/09/2018

## 2018-05-09 NOTE — Progress Notes (Signed)
Pharmacy Electrolyte Monitoring Consult:  Pharmacy consulted to assist in monitoring and replacing electrolytes in this 48 y.o. male admitted on 05/08/2018 with Shortness of Breath   Labs:  Sodium (mmol/L)  Date Value  05/09/2018 131 (L)  08/12/2013 136   Potassium (mmol/L)  Date Value  05/09/2018 2.8 (L)  08/12/2013 3.8   Magnesium (mg/dL)  Date Value  05/09/2018 2.0   Phosphorus (mg/dL)  Date Value  05/09/2018 2.6   Calcium (mg/dL)  Date Value  05/09/2018 7.5 (L)   Calcium, Total (mg/dL)  Date Value  08/12/2013 8.8   Albumin (g/dL)  Date Value  05/08/2018 4.6  12/15/2013 4.7  08/10/2013 4.2    Assessment/Plan: Patient is in DKA as evidenced by pH < 7.0, bicarb < 15, beta hydroxy butyrate +, and BG > 250. On BMP patient has multiple electrolytes abnormalities including hyponatremia, hypokalemia, although mag phos WNL. Goal Na 135 - 145; K+ > 4.0; keep Mg > 2.0, Phos 2.5 - 4.5  02/15 @ 0500 K 2.8 will replace w/ KCI 40 mEq PO x 2 and will switch fluids to D5 + 1/2NS + KCI 40 mEq/L @ 100 ml/hr and will monitor electrolytes w/ q4h labs.  Christian [name], PharmD, BCPS Clinical Pharmacist 05/09/2018

## 2018-05-09 NOTE — Progress Notes (Signed)
[facility] [place] NAME: Christian [name]    MR#:  [id number]  DATE OF BIRTH:  1970-10-01  SUBJECTIVE:   States he is feeling fine this morning.  He feels better than he did yesterday.  He endorses some fatigue and malaise.  He also endorses some mild generalized abdominal pain.  REVIEW OF SYSTEMS:  Review of Systems  Constitutional: Positive for malaise/fatigue. Negative for chills and fever.  HENT: Negative for congestion and sore throat.   Eyes: Negative for blurred vision and double vision.  Respiratory: Negative for cough and shortness of breath.   Cardiovascular: Negative for chest pain and palpitations.  Gastrointestinal: Positive for abdominal pain and nausea. Negative for vomiting.  Genitourinary: Negative for dysuria and urgency.  Musculoskeletal: Negative for back pain and neck pain.  Neurological: Negative for dizziness and headaches.  Psychiatric/Behavioral: Negative for depression. The patient is not nervous/anxious.     DRUG ALLERGIES:  No Known Allergies VITALS:  Blood pressure (!) 77/58, pulse 88, temperature 98.3 F (36.8 C), temperature source Oral, resp. rate 19, height 5\' 6"  (1.676 m), weight 82.1 kg, SpO2 98 %. PHYSICAL EXAMINATION:  Physical Exam  General:  Tired appearing, lying in bed in no acute distress HEENT:  Cephalic, atraumatic, EOMI, no scleral icterus, moist mucous membranes Cardiovascular:  RRR, no murmurs, rubs, gallops Lungs: + Diminished breath sounds in the lung bases bilaterally, no wheezes or rhonchi Abdomen: + Mild epigastric tenderness to palpation, no rebound or guarding, +BS Musculoskeletal:  No pedal edema, cyanosis, clubbing Neuro:  Cranial nerves II through XII grossly intact, no focal deficits, +global weakness Psych: Alert and oriented x3, flat affect Skin: Warm and dry, no rashes or lesions LABORATORY PANEL:  Male CBC Recent Labs  Lab 05/09/18 0850  WBC 7.8  HGB 11.8*  HCT  31.6*  PLT 190   ------------------------------------------------------------------------------------------------------------------ Chemistries  Recent Labs  Lab 05/08/18 2009 05/09/18 0100  05/09/18 1307  NA 128* 131*   < > 128*  K 3.1* 3.0*   < > 3.1*  CL 88* 100   < > 99  CO2 <7* 8*   < > 18*  GLUCOSE 728* 468*   < > 137*  BUN 11 10   < > 10  CREATININE 1.70* 1.38*   < > 0.88  CALCIUM 9.3 7.9*   < > 7.2*  MG  --  2.0  --   --   AST 30  --   --   --   ALT 20  --   --   --   ALKPHOS 104  --   --   --   BILITOT 1.9*  --   --   --    < > = values in this interval not displayed.   RADIOLOGY:  Dg Chest 2 View  Result Date: 05/08/2018 CLINICAL DATA:  Shortness of breath starting around 3 o'clock today. EXAM: CHEST - 2 VIEW COMPARISON:  Radiographs 03/26/2018 and 12/30/2017.  CT 12/30/2017. FINDINGS: The heart size and mediastinal contours are stable. There is stable linear atelectasis or scarring at both lung bases. No edema, confluent airspace opacity, pleural effusion or pneumothorax. No acute osseous findings. IMPRESSION: Stable chest without evidence of acute process. Electronically Signed   By: Richardean Sale M.D.   On: 05/08/2018 18:55   US Renal  Result Date: 05/09/2018 CLINICAL DATA:  Acute renal insufficiency/acute kidney injury. Current history of diabetes. EXAM: RENAL / URINARY TRACT ULTRASOUND COMPLETE COMPARISON:  CTA abdomen and pelvis 12/30/2017. FINDINGS: Right Kidney: Renal measurements: Approximately 13.7 x 7.5 x 4.9 cm = volume: 268 mL. Lobular contour as noted on the prior CT. Approximate 3.7 x 3.6 x 4.2 cm benign simple cyst arising from the UPPER pole, unchanged. No convincing solid renal mass. No hydronephrosis. Well-preserved cortex. Normal parenchymal echotexture. Left Kidney: Renal measurements: Approximately 14.2 x 5.8 x 5.2 cm = volume: 228 mL. Lobular contour as noted on the prior CT. Mild diffuse cortical thinning. Echogenic parenchyma. No focal parenchymal  abnormality. No hydronephrosis. Bladder: Normal for degree of bladder distention. IMPRESSION: 1. No evidence of hydronephrosis involving either kidney to suggest urinary tract obstruction as the cause of acute renal insufficiency. 2. Mild diffuse cortical thinning involving the LEFT kidney and echogenic LEFT renal parenchyma as can be seen in patients with kidney disease. 3. Lobular contour to both kidneys as noted on a prior CTA in October, 2019. 4. Benign approximate 4 cm simple cyst arising from the UPPER pole of the RIGHT kidney. Electronically Signed   By: Evangeline Dakin M.D.   On: 05/09/2018 10:37   Dg Chest Portable 1 View  Result Date: 05/08/2018 CLINICAL DATA:  Increased shortness of breath and confusion. EXAM: PORTABLE CHEST 1 VIEW COMPARISON:  May 08, 2018 6:14 p.m. FINDINGS: The heart size and mediastinal contours are within normal limits. Both lungs are clear. The visualized skeletal structures are unremarkable. IMPRESSION: No active cardiopulmonary disease. Electronically Signed   By: Abelardo Diesel M.D.   On: 05/08/2018 20:40   ASSESSMENT AND PLAN:   HHS in type 2 diabetes- blood sugars improving.  Recent A1c 9.2%. -Continue insulin drip and transition to subcutaneous insulin as able -BMP every 4 hours -Continue IV fluids  Lactic acidosis- likely due to dehydration, alcohol use, and metformin use.  Trending down. -Continue IV fluids  Acute renal failure- secondary to dehydration.  Resolved with IV fluids.  Hypokalemia/hypomagnesemia, hypophosphatemia -Replete and recheck  CAD-stable, no active chest pain -Continue aspirin, Coreg, Crestor  Alcohol abuse -CIWA -Thiamine, folate, multivitamin   All the records are reviewed and case discussed with Care Management/Social Worker. Management plans discussed with the patient, family and they are in agreement.  CODE STATUS: Full Code  TOTAL TIME TAKING CARE OF THIS PATIENT: 40 minutes.   More than 50% of the time was  spent in counseling/coordination of care: YES  POSSIBLE D/C IN 2-3 DAYS, DEPENDING ON CLINICAL CONDITION.   Berna Spare Gwendolynn Merkey M.D on 05/09/2018 at 4:15 PM  Between 7am to 6pm - Pager - [phone]  After 6pm go to [email or web address] - Proofreader  [facility]  Office  [phone]  CC: Primary care physician; Patient, No Pcp Per  Note: This dictation was prepared with Dragon dictation along with smaller phrase technology. Any transcriptional errors that result from this process are unintentional.

## 2018-05-09 NOTE — Progress Notes (Signed)
Pharmacy Electrolyte Monitoring Consult:  Pharmacy consulted to assist in monitoring and replacing electrolytes in this 48 y.o. male admitted on 05/08/2018 with Shortness of Breath   Labs:  Sodium (mmol/L)  Date Value  05/09/2018 133 (L)  08/12/2013 136   Potassium (mmol/L)  Date Value  05/09/2018 3.4 (L)  08/12/2013 3.8   Magnesium (mg/dL)  Date Value  05/09/2018 2.0   Phosphorus (mg/dL)  Date Value  05/09/2018 <1.0 (LL)   Calcium (mg/dL)  Date Value  05/09/2018 7.7 (L)   Calcium, Total (mg/dL)  Date Value  08/12/2013 8.8   Albumin (g/dL)  Date Value  05/08/2018 4.6  12/15/2013 4.7  08/10/2013 4.2    Assessment/Plan: Patient is in DKA as evidenced by pH < 7.0, bicarb < 15, beta hydroxy butyrate +, and BG > 250. On BMP patient has multiple electrolytes abnormalities including hyponatremia, hypokalemia, although mag phos WNL.  K+ > 4.0; keep Mg > 2.0, Phos 2.5 - 4.5  02/15 @ 2200 K 3.4 will supplement w/ KCI 40 mEq PO x 2 and recheck electrolytes w/ am labs.  Tobie Lords, PharmD, BCPS Clinical Pharmacist 05/09/2018

## 2018-05-09 NOTE — H&P (Signed)
[facility] [place] NAME: Christian [name]    MR#:  [id number]  DATE OF BIRTH:  25-Nov-1970  DATE OF ADMISSION:  05/08/2018  PRIMARY CARE PHYSICIAN: Patient, No Pcp Per   REQUESTING/REFERRING PHYSICIAN: Nena Polio, MD  CHIEF COMPLAINT:   Chief Complaint  Patient presents with  . Shortness of Breath    HISTORY OF PRESENT ILLNESS:  Christian [name]  is a 48 y.o. male with a known history of T2IDDM p/w SOB. Pt AAOx3, but encephalopathic; responses are delayed but appropriate. Pt's sister at bedside, provides most of Hx, though minimal. Pt w/ poor PO intake x4d, dehydrated as well. When asked about EtOH use, he states he drinks, "A lot". Quantified @~3/4 of one fifth of vodka per day. Tachycardic, tachypneic, mottled on ED arrival. Glucose 728. IVF, insulin gtt. Improving. Cr 1.7. Admit for HHS in setting of suspected ketosis-prone T2NIDDM, AKI.  PAST MEDICAL HISTORY:   Past Medical History:  Diagnosis Date  . CAD (coronary artery disease)    a. 07/2013 NSTEMI/PCI: LCX 53m (4.0x23 Xience DES), RCA 1m, EF > 55%;  b. 07/2013 Echo: EF 60-65%, diast dysfxn, mild LVH, mildly dil LA, nl RV size/fxn, nl RVSP.  Marland Kitchen History of tobacco abuse    a. quit ~ 2010  . Hyperlipidemia   . Morbid obesity ([place])    a. weighed 168 @ age 75.    PAST SURGICAL HISTORY:   Past Surgical History:  Procedure Laterality Date  . ADENOIDECTOMY    . CARDIAC CATHETERIZATION  07/2013   [facility]'x1 stent    SOCIAL HISTORY:   Social History   Tobacco Use  . Smoking status: Former Smoker    Packs/day: 0.50    Years: 20.00    Pack years: 10.00    Types: Cigarettes  . Smokeless tobacco: Never Used  Substance Use Topics  . Alcohol use: No    FAMILY HISTORY:   Family History  Problem Relation Age of Onset  . Heart disease Mother   . Esophageal cancer Father     DRUG ALLERGIES:  No Known Allergies  REVIEW OF SYSTEMS:   Review of Systems  Unable to perform ROS:  Mental status change  Constitutional: Positive for malaise/fatigue.  Respiratory: Positive for shortness of breath.   Gastrointestinal: Positive for nausea.  Neurological: Positive for weakness.   Alert/AAOx3 but encephalopathic, slowed/delayed responses, weak. MEDICATIONS AT HOME:   Prior to Admission medications   Medication Sig Start Date End Date Taking? Authorizing Provider  acetaminophen (TYLENOL) 500 MG tablet Take 1,000 mg by mouth every 6 (six) hours as needed.    [provider]  aspirin 81 MG chewable tablet Chew 1 tablet (81 mg total) by mouth daily. Patient not taking: Reported on 03/26/2018 01/01/18   Dustin Flock, MD  carisoprodol (SOMA) 350 MG tablet Take 1 tablet (350 mg total) by mouth 3 (three) times daily as needed for muscle spasms. Patient not taking: Reported on 05/08/2018 03/27/18 03/27/19  Dustin Flock, MD  carvedilol (COREG) 25 MG tablet Take 1 tablet (25 mg total) by mouth 2 (two) times daily. Patient not taking: Reported on 05/08/2018 03/27/18 03/27/19  Dustin Flock, MD  glipiZIDE (GLUCOTROL) 10 MG tablet Take 1 tablet (10 mg total) by mouth 2 (two) times daily. Patient not taking: Reported on 05/08/2018 03/27/18 03/27/19  Dustin Flock, MD  ibuprofen (ADVIL,MOTRIN) 400 MG tablet Take 1 tablet (400 mg total) by mouth every 6 (six) hours as needed for  moderate pain. Patient not taking: Reported on 05/08/2018 03/27/18   Dustin Flock, MD  losartan (COZAAR) 100 MG tablet Take 1 tablet (100 mg total) by mouth daily. Patient not taking: Reported on 05/08/2018 03/27/18 03/27/19  Dustin Flock, MD  metFORMIN (GLUCOPHAGE) 1000 MG tablet Take 1 tablet (1,000 mg total) by mouth 2 (two) times daily with a meal. Patient not taking: Reported on 05/08/2018 03/27/18   Dustin Flock, MD  potassium chloride SA (K-DUR,KLOR-CON) 20 MEQ tablet Take 1 tablet (20 mEq total) by mouth daily for 3 days. 03/27/18 03/30/18  Dustin Flock, MD  rosuvastatin (CRESTOR) 10 MG tablet Take 1 tablet  (10 mg total) by mouth daily. Patient not taking: Reported on 05/08/2018 03/27/18   Dustin Flock, MD      VITAL SIGNS:  Blood pressure (!) 143/93, pulse (!) 119, resp. rate (!) 33, height 5\' 6"  (1.676 m), weight 82.1 kg, SpO2 100 %.  PHYSICAL EXAMINATION:  Physical Exam Constitutional:      General: He is not in acute distress.    Appearance: He is ill-appearing. He is not diaphoretic.  HENT:     Head: Atraumatic.     Mouth/Throat:     Pharynx: Oropharynx is clear.  Eyes:     General: No scleral icterus.    Extraocular Movements: Extraocular movements intact.     Conjunctiva/sclera: Conjunctivae normal.  Neck:     Musculoskeletal: Neck supple.  Cardiovascular:     Rate and Rhythm: Regular rhythm. Tachycardia present.     Heart sounds: Normal heart sounds. No murmur. No friction rub. No gallop.   Pulmonary:     Effort: No respiratory distress.     Breath sounds: Normal breath sounds. No stridor. No wheezing, rhonchi or rales.  Abdominal:     General: Bowel sounds are normal. There is no distension.     Palpations: Abdomen is soft.     Tenderness: There is no abdominal tenderness. There is no guarding or rebound.  Musculoskeletal: Normal range of motion.        General: No swelling or tenderness.  Lymphadenopathy:     Cervical: No cervical adenopathy.  Skin:    General: Skin is warm and dry.     Coloration: Skin is pale.     Findings: No erythema or rash.  Neurological:     Mental Status: He is alert and oriented to person, place, and time.  Psychiatric:        Attention and Perception: Attention and perception normal. He is attentive.        Mood and Affect: Mood and affect normal.        Speech: He is communicative. Speech is delayed. Speech is not rapid and pressured, slurred or tangential.        Behavior: Behavior is slowed and withdrawn. Behavior is not agitated, aggressive, hyperactive or combative. Behavior is cooperative.        Thought Content: Thought content  normal.        Cognition and Memory: Cognition and memory normal.        Judgment: Judgment normal.    LABORATORY PANEL:   CBC Recent Labs  Lab 05/08/18 2009  WBC 22.2*  HGB 15.6  HCT 45.0  PLT 445*   ------------------------------------------------------------------------------------------------------------------  Chemistries  Recent Labs  Lab 05/08/18 2009 05/09/18 0100  NA 128* 131*  K 3.1* 3.0*  CL 88* 100  CO2 <7* 8*  GLUCOSE 728* 468*  BUN 11 10  CREATININE 1.70* 1.38*  CALCIUM 9.3  7.9*  MG  --  2.0  AST 30  --   ALT 20  --   ALKPHOS 104  --   BILITOT 1.9*  --    ------------------------------------------------------------------------------------------------------------------  Cardiac Enzymes Recent Labs  Lab 05/08/18 2009  TROPONINI 0.04*   ------------------------------------------------------------------------------------------------------------------  RADIOLOGY:  Dg Chest 2 View  Result Date: 05/08/2018 CLINICAL DATA:  Shortness of breath starting around 3 o'clock today. EXAM: CHEST - 2 VIEW COMPARISON:  Radiographs 03/26/2018 and 12/30/2017.  CT 12/30/2017. FINDINGS: The heart size and mediastinal contours are stable. There is stable linear atelectasis or scarring at both lung bases. No edema, confluent airspace opacity, pleural effusion or pneumothorax. No acute osseous findings. IMPRESSION: Stable chest without evidence of acute process. Electronically Signed   By: Richardean Sale M.D.   On: 05/08/2018 18:55   Dg Chest Portable 1 View  Result Date: 05/08/2018 CLINICAL DATA:  Increased shortness of breath and confusion. EXAM: PORTABLE CHEST 1 VIEW COMPARISON:  May 08, 2018 6:14 p.m. FINDINGS: The heart size and mediastinal contours are within normal limits. Both lungs are clear. The visualized skeletal structures are unremarkable. IMPRESSION: No active cardiopulmonary disease. Electronically Signed   By: Abelardo Diesel M.D.   On: 05/08/2018 20:40     IMPRESSION AND PLAN:   A/P: 44M p/w HHS 2/2 suspected ketosis-prone T2NIDDM. Cr elevation/AKI. EtOH abuse/dependence w/ withdrawal. Hypochloremic hyponatremia, hypokalemia, hyperbilirubinemia, Troponin elevation, lactate elevation, leukocytosis, thrombocytosis. -HSS, suspected ketosis-prone T2NIDDM, uncontrolled DM: Glucose 728. BHB > 8. Insulin gtt, IVF. Monitor metabolics. Replete electrolytes. A1c. -Cr elevation/AKI, hypochloremic hyponatremia, hypokalemia: Dehydration, prerenal AKI. IVF. Urine studies (electrolytes, protein, Cr, urea nitrogen), renal U/S. Monitor BMP, avoid nephrotoxins. -EtOH abuse/dependence w/ withdrawal: CIWA, thiamine/folate/MVI. -Troponin elevation: 2/2 impaired renal clearance in setting of AKI. Monitor. -Lactate elevation: Dehydration, renal failure, Metformin use. IVF. Hold Metformin. -Leukocytosis, thrombocytosis: Hemoconcentration + reactive. -c/w other home meds/formulary subs as tolerated. -FEN/GI: NPO for now. -DVT PPx: Lovenox. -Code status: Full code. -Disposition: Admission, > 2 midnights.   All the records are reviewed and case discussed with ED provider. Management plans discussed with the patient, family and they are in agreement.  CODE STATUS: Full code.  TOTAL TIME TAKING CARE OF THIS PATIENT: 75 minutes.    Arta Silence M.D on 05/09/2018 at 2:30 AM  Between 7am to 6pm - Pager - [phone]  After 6pm go to [email or web address] - Proofreader  [facility]  Office  [phone]  CC: Primary care physician; Patient, No Pcp Per   Note: This dictation was prepared with Dragon dictation along with smaller phrase technology. Any transcriptional errors that result from this process are unintentional.

## 2018-05-09 NOTE — Progress Notes (Signed)
Pharmacy Electrolyte Monitoring Consult:  Pharmacy consulted to assist in monitoring and replacing electrolytes in this 48 y.o. male admitted on 05/08/2018 with Shortness of Breath   Labs:  Sodium (mmol/L)  Date Value  05/09/2018 130 (L)  08/12/2013 136   Potassium (mmol/L)  Date Value  05/09/2018 3.3 (L)  08/12/2013 3.8   Magnesium (mg/dL)  Date Value  05/09/2018 2.0   Phosphorus (mg/dL)  Date Value  05/09/2018 <1.0 (LL)   Calcium (mg/dL)  Date Value  05/09/2018 7.5 (L)   Calcium, Total (mg/dL)  Date Value  08/12/2013 8.8   Albumin (g/dL)  Date Value  05/08/2018 4.6  12/15/2013 4.7  08/10/2013 4.2    Assessment/Plan: Patient is in DKA as evidenced by pH < 7.0, bicarb < 15, beta hydroxy butyrate +, and BG > 250. On BMP patient has multiple electrolytes abnormalities including hyponatremia, hypokalemia, although mag phos WNL.  K+ > 4.0; keep Mg > 2.0, Phos 2.5 - 4.5  0215  1746 K 3.3: hypokalemia slowly improving following previous supplementation. Patient also receiving D5 + 1/2NS with KCl 40 mEq at 100 ml/hr.  Will not add additional supplementation at this time as patient's K+ is steadily increasing with current regimen.  2/15 1307 Phos level of <1.0 and no supplementation ordered previously.  Since patient received oral KCl and has KCl in IVF with Hyponatremia will order Sodium phosphate 30 mmol IV once over 6 hours.  Will recheck Phos with morning labs.  will monitor electrolytes w/ q4h labs.  Evelena Asa, PharmD Clinical Pharmacist 05/09/2018

## 2018-05-09 NOTE — Progress Notes (Signed)
Pharmacy Electrolyte Monitoring Consult:  Pharmacy consulted to assist in monitoring and replacing electrolytes in this 48 y.o. male admitted on 05/08/2018 with Shortness of Breath   Labs:  Sodium (mmol/L)  Date Value  05/09/2018 129 (L)  08/12/2013 136   Potassium (mmol/L)  Date Value  05/09/2018 2.6 (LL)  08/12/2013 3.8   Magnesium (mg/dL)  Date Value  05/09/2018 2.0   Phosphorus (mg/dL)  Date Value  05/09/2018 2.6   Calcium (mg/dL)  Date Value  05/09/2018 7.4 (L)   Calcium, Total (mg/dL)  Date Value  08/12/2013 8.8   Albumin (g/dL)  Date Value  05/08/2018 4.6  12/15/2013 4.7  08/10/2013 4.2    Assessment/Plan: Patient is in DKA as evidenced by pH < 7.0, bicarb < 15, beta hydroxy butyrate +, and BG > 250. On BMP patient has multiple electrolytes abnormalities including hyponatremia, hypokalemia, although mag phos WNL.  K+ > 4.0; keep Mg > 2.0, Phos 2.5 - 4.5  02/15 0850 K 2.6: D5 + 1/2NS at 100 ml/hr started at [place]. There is still an outstanding order for KCI 40 mEq PO x 2  0215  1307 K 3.1: hypokalemia slowly improving following previous supplementation. Continue  D5 + 1/2NS at 100 ml/hr  will monitor electrolytes w/ q4h labs.  Vallery Sa, PharmD Clinical Pharmacist 05/09/2018

## 2018-05-09 NOTE — Consult Note (Signed)
PULMONARY / CRITICAL CARE MEDICINE  Name: Christian [name] MRN: [id number] DOB: 09/09/1970    LOS: 1  Referring Provider: Dr. Jodell Cipro Reason for Referral: DKA   HPI: 48 year old male with type 2 diabetes, recently diagnosed, noncompliant and alcohol abuse presenting with shortness of breath, nausea and vomiting.  His ED work-up showed severe hypoglycemia, severe lactic acidosis with an anion gap of less than 7, acute renal failure, hypokalemia and leukocytosis.  He was ruled in for DKA and admitted to the ICU.  He reports drinking every night to help with insomnia.  He is unable to state the exact quantity that he drinks.  He denies chest pain, palpitations, dizziness and headache but reports improvement in shortness of breath, nausea and vomiting.  States that he has not been taking his oral antidiabetic medications.  He was started on metformin and glipizide  Past Medical History:  Diagnosis Date  . CAD (coronary artery disease)    a. 07/2013 NSTEMI/PCI: LCX 18m (4.0x23 Xience DES), RCA 49m, EF > 55%;  b. 07/2013 Echo: EF 60-65%, diast dysfxn, mild LVH, mildly dil LA, nl RV size/fxn, nl RVSP.  Marland Kitchen History of tobacco abuse    a. quit ~ 2010  . Hyperlipidemia   . Morbid obesity ([place])    a. weighed 168 @ age 24.   Past Surgical History:  Procedure Laterality Date  . ADENOIDECTOMY    . CARDIAC CATHETERIZATION  07/2013   [facility]'x1 stent   Prior to Admission medications   Medication Sig Start Date End Date Taking? Authorizing Provider  amLODipine (NORVASC) 5 MG tablet Take 5 mg by mouth daily.   Yes [provider]  clopidogrel (PLAVIX) 75 MG tablet Take 75 mg by mouth daily.   Yes [provider]  donepezil (ARICEPT) 5 MG tablet Take 1 tablet (5 mg total) by mouth at bedtime. 11/17/17 12/27/17 Yes Sowles, Drue Stager, MD  empagliflozin (JARDIANCE) 25 MG TABS tablet Take 25 mg by mouth daily.   Yes [provider]  glycopyrrolate (ROBINUL) 1 MG tablet Take 1 mg by mouth 2  (two) times daily.   Yes [provider]  insulin aspart (NOVOLOG FLEXPEN) 100 UNIT/ML FlexPen Inject 12 Units into the skin 2 (two) times daily.   Yes [provider]  insulin aspart (NOVOLOG) 100 UNIT/ML FlexPen Inject 18 Units into the skin daily. At 1700   Yes [provider]  Insulin Degludec-Liraglutide (XULTOPHY) 100-3.6 UNIT-MG/ML SOPN Inject 50 Units into the skin daily.   Yes [provider]  levETIRAcetam (KEPPRA) 500 MG tablet Take 500 mg by mouth 2 (two) times daily.   Yes [provider]  lipase/protease/amylase (CREON) 12000 units CPEP capsule Take 6,000 Units by mouth 3 (three) times daily before meals.   Yes [provider]  lipase/protease/amylase (CREON) 12000 units CPEP capsule Take 3,000 Units by mouth at bedtime. With snack   Yes [provider]  lisinopril (PRINIVIL,ZESTRIL) 5 MG tablet Take 5 mg by mouth daily.   Yes [provider]  metoprolol succinate (TOPROL-XL) 25 MG 24 hr tablet Take 1 tablet (25 mg total) by mouth daily. 11/17/17  Yes Sowles, Drue Stager, MD  rosuvastatin (CRESTOR) 40 MG tablet Take 1 tablet (40 mg total) by mouth daily. 11/17/17 12/27/17 Yes Steele Sizer, MD  aspirin EC 81 MG tablet Take 81 mg by mouth daily.    [provider]  famotidine (PEPCID) 20 MG tablet Take 1 tablet (20 mg total) by mouth 2 (two) times daily. 11/17/17 12/17/17  Steele Sizer, MD  gabapentin (NEURONTIN) 300 MG capsule Take 1 capsule (300 mg total) by mouth 2 (two) times daily. 11/17/17 12/17/17  Steele Sizer, MD  insulin glargine (LANTUS) 100 UNIT/ML injection Inject 0.1 mLs (10 Units total) into the skin daily. 11/17/17 12/17/17  Steele Sizer, MD  lacosamide 100 MG TABS Take 1 tablet (100 mg total) by mouth 2 (two) times daily. Patient not taking: Reported on 12/27/2017 04/04/17   Fritzi Mandes, MD  promethazine (PHENERGAN) 12.5 MG tablet Take 1 tablet (12.5 mg total) by mouth every 6 (six) hours as  needed for nausea or vomiting. Patient not taking: Reported on 12/27/2017 01/21/17   Stark Klein, MD  sertraline (ZOLOFT) 25 MG tablet Take 1 tablet (25 mg total) by mouth daily. Patient not taking: Reported on 12/27/2017 11/17/17   Steele Sizer, MD   Allergies No Known Allergies  Family History Family History  Problem Relation Age of Onset  . Heart disease Mother   . Esophageal cancer Father    Social History  reports that he has quit smoking. His smoking use included cigarettes. He has a 10.00 pack-year smoking history. He has never used smokeless tobacco. He reports that he does not drink alcohol or use drugs.  Review Of Systems:   Constitutional: Negative for fever and chills positive for malaise and fatigue.  HENT: Negative for congestion and rhinorrhea.  Eyes: Negative for redness and visual disturbance.  Respiratory: Negative for shortness of breath and wheezing.  Cardiovascular: Negative for chest pain and palpitations.  Gastrointestinal: Positive for nausea , vomiting but negative for abdominal pain and  loose stools Genitourinary: Negative for dysuria and urgency.  Endocrine: Reports polyuria, polyphagia but denies heat or cold intolerance Musculoskeletal: Negative for myalgias and arthralgias.  Skin: Negative for pallor and wound.  Neurological: Negative for dizziness and headaches   VITAL SIGNS: BP (!) 143/93 (BP Location: Right Arm)   Pulse (!) 119   Resp (!) 33   Ht 5\' 6"  (1.676 m)   Wt 82.1 kg   SpO2 100%   BMI 29.21 kg/m   HEMODYNAMICS:    VENTILATOR SETTINGS:    INTAKE / OUTPUT: No intake/output data recorded.  PHYSICAL EXAMINATION: General: Acutely ill looking, in no distress HEENT: PERRLA, trachea midline, no JVD, oral mucosa dry Neuro: Alert and oriented x2, moves all extremities, no other focal deficits Cardiovascular: Apical pulse regular, S1-S2, no murmur regurg or gallop, +2 pulses bilaterally, no edema Lungs: Bilateral breath sounds,  diminished in the bases, no wheezes or rhonchi Abdomen: Obese, normal bowel sounds in all 4 quadrants, palpation reveals no organomegaly Musculoskeletal: Positive range of motion, no joint deformities Skin: Warm and dry  LABS:  BMET Recent Labs  Lab 05/08/18 2009 05/09/18 0100  NA 128* 131*  K 3.1* 3.0*  CL 88* 100  CO2 <7* 8*  BUN 11 10  CREATININE 1.70* 1.38*  GLUCOSE 728* 468*    Electrolytes Recent Labs  Lab 05/08/18 2009 05/09/18 0100  CALCIUM 9.3 7.9*  MG  --  2.0  PHOS  --  2.6    CBC Recent Labs  Lab 05/08/18 2009  WBC 22.2*  HGB 15.6  HCT 45.0  PLT 445*    Coag's No results for input(s): APTT, INR in the last 168 hours.  Sepsis Markers Recent Labs  Lab 05/08/18 2009 05/08/18 2204  LATICACIDVEN 4.7* 3.0*    ABG No results for input(s): PHART, PCO2ART, PO2ART in the last 168 hours.  Liver Enzymes Recent Labs  Lab 05/08/18 2009  AST 30  ALT 20  ALKPHOS 104  BILITOT 1.9*  ALBUMIN 4.6    Cardiac Enzymes Recent Labs  Lab 05/08/18 2009  TROPONINI 0.04*    Glucose Recent Labs  Lab 05/08/18 2005 05/08/18 2042 05/08/18 2226 05/08/18 2333 05/09/18 0015 05/09/18 0121  GLUCAP >600* >600* 563* 441* 481* 354*    Imaging Dg Chest 2 View  Result Date: 05/08/2018 CLINICAL DATA:  Shortness of breath starting around 3 o'clock today. EXAM: CHEST - 2 VIEW COMPARISON:  Radiographs 03/26/2018 and 12/30/2017.  CT 12/30/2017. FINDINGS: The heart size and mediastinal contours are stable. There is stable linear atelectasis or scarring at both lung bases. No edema, confluent airspace opacity, pleural effusion or pneumothorax. No acute osseous findings. IMPRESSION: Stable chest without evidence of acute process. Electronically Signed   By: Richardean Sale M.D.   On: 05/08/2018 18:55   Dg Chest Portable 1 View  Result Date: 05/08/2018 CLINICAL DATA:  Increased shortness of breath and confusion. EXAM: PORTABLE CHEST 1 VIEW COMPARISON:  May 08, 2018 6:14 p.m. FINDINGS: The heart size and mediastinal contours are within normal limits. Both lungs are clear. The visualized skeletal structures are unremarkable. IMPRESSION: No active cardiopulmonary disease. Electronically Signed   By: Abelardo Diesel M.D.   On: 05/08/2018 20:40    ASSESSMENT  Hyperosmolar hyperglycemic state Severe lactic acidosis Acute renal failure secondary to volume depletion Acute encephalopathy secondary to hypoglycemia EtOH abuse Hypokalemia Leukocytosis  PLAN Hemodynamic monitoring per ICU protocol DKA protocol Monitor and correct electrolytes Trend WBC procalcitonin and consider empiric antibiotics if persistently elevated Management consult for medication assistance CIWA protocol  Best Practice: Code Status: Full code Diet: Clear liquids GI prophylaxis: Protonix VTE prophylaxis: Subcu Lovenox  FAMILY  - Updates: Sister updated at bedside   S. Tukov-Yual, ANP-BC Pulmonary and [place] Pager [phone] or [phone]  NB: This document was prepared using Dragon voice recognition software and may include unintentional dictation errors.    05/09/2018, 1:58 AM

## 2018-05-09 NOTE — Progress Notes (Signed)
Spoke with Dr. Jodell Cipro pt with blood pressure of 116/76 and has 25mg  of coreg ordered for tonight. Per MD may hold coreg tonight. MD to adjust dose for tomorrow morning.

## 2018-05-10 LAB — CALCIUM, IONIZED: Calcium, Ionized, Serum: 4.2 mg/dL — ABNORMAL LOW (ref 4.5–5.6)

## 2018-05-10 LAB — GLUCOSE, CAPILLARY
Glucose-Capillary: 190 mg/dL — ABNORMAL HIGH (ref 70–99)
Glucose-Capillary: 271 mg/dL — ABNORMAL HIGH (ref 70–99)
Glucose-Capillary: 207 mg/dL — ABNORMAL HIGH (ref 70–99)
Glucose-Capillary: 229 mg/dL — ABNORMAL HIGH (ref 70–99)
Glucose-Capillary: 216 mg/dL — ABNORMAL HIGH (ref 70–99)

## 2018-05-10 LAB — CBC
HEMATOCRIT: 27 % — AB (ref 39.0–52.0)
Hemoglobin: 9.7 g/dL — ABNORMAL LOW (ref 13.0–17.0)
MCH: 32.4 pg (ref 26.0–34.0)
MCHC: 35.9 g/dL (ref 30.0–36.0)
MCV: 90.3 fL (ref 80.0–100.0)
Platelets: 127 10*3/uL — ABNORMAL LOW (ref 150–400)
RBC: 2.99 MIL/uL — ABNORMAL LOW (ref 4.22–5.81)
RDW: 15 % (ref 11.5–15.5)
WBC: 6.4 10*3/uL (ref 4.0–10.5)
nRBC: 0 % (ref 0.0–0.2)

## 2018-05-10 LAB — RENAL FUNCTION PANEL
Albumin: 3.1 g/dL — ABNORMAL LOW (ref 3.5–5.0)
Anion gap: 10 (ref 5–15)
BUN: 7 mg/dL (ref 6–20)
CO2: 20 mmol/L — ABNORMAL LOW (ref 22–32)
Calcium: 7.3 mg/dL — ABNORMAL LOW (ref 8.9–10.3)
Chloride: 105 mmol/L (ref 98–111)
Creatinine, Ser: 0.73 mg/dL (ref 0.61–1.24)
GFR calc Af Amer: >60 mL/min (ref >60)
GFR calc non Af Amer: >60 mL/min (ref >60)
GLUCOSE: 237 mg/dL — AB (ref 70–99)
Phosphorus: 2.6 mg/dL (ref 2.5–4.6)
Potassium: 3 mmol/L — ABNORMAL LOW (ref 3.5–5.1)
Sodium: 135 mmol/L (ref 135–145)

## 2018-05-10 LAB — COMPREHENSIVE METABOLIC PANEL
ALT: 12 U/L (ref 0–44)
AST: 15 U/L (ref 15–41)
Albumin: 3.2 g/dL — ABNORMAL LOW (ref 3.5–5.0)
Alkaline Phosphatase: 58 U/L (ref 38–126)
Anion gap: 10 (ref 5–15)
BUN: 8 mg/dL (ref 6–20)
CHLORIDE: 103 mmol/L (ref 98–111)
CO2: 21 mmol/L — AB (ref 22–32)
Calcium: 7.9 mg/dL — ABNORMAL LOW (ref 8.9–10.3)
Creatinine, Ser: 0.87 mg/dL (ref 0.61–1.24)
GFR calc Af Amer: >60 mL/min (ref >60)
GFR calc non Af Amer: >60 mL/min (ref >60)
GLUCOSE: 184 mg/dL — AB (ref 70–99)
Potassium: 3.1 mmol/L — ABNORMAL LOW (ref 3.5–5.1)
Sodium: 134 mmol/L — ABNORMAL LOW (ref 135–145)
Total Bilirubin: 1.2 mg/dL (ref 0.3–1.2)
Total Protein: 5.9 g/dL — ABNORMAL LOW (ref 6.5–8.1)

## 2018-05-10 LAB — PROCALCITONIN: Procalcitonin: 3.42 ng/mL

## 2018-05-10 LAB — PHOSPHORUS: Phosphorus: <1 mg/dL — CL (ref 2.5–4.6)

## 2018-05-10 LAB — UREA NITROGEN, URINE: Urea Nitrogen, Ur: 132 mg/dL

## 2018-05-10 MED ORDER — POTASSIUM CHLORIDE 10 MEQ/100ML IV SOLN: 10.0000 meq | INTRAVENOUS | Status: DC

## 2018-05-10 MED ORDER — POTASSIUM PHOSPHATES 15 MMOLE/5ML IV SOLN
45.0000 mmol | Freq: Once | INTRAVENOUS | Status: DC
  Administered 2018-05-10: 45 mmol via INTRAVENOUS
  Filled 2018-05-10: qty 15

## 2018-05-10 MED ORDER — POTASSIUM PHOSPHATES 15 MMOLE/5ML IV SOLN
45.0000 mmol | Freq: Once | INTRAVENOUS | Status: AC
  Administered 2018-05-10: 45 mmol via INTRAVENOUS
  Filled 2018-05-10: qty 15

## 2018-05-10 MED ORDER — INSULIN ASPART 100 UNIT/ML ~~LOC~~ SOLN
0.0000 [IU] | Freq: Every day | SUBCUTANEOUS | Status: DC
  Administered 2018-05-10: 2 [IU] via SUBCUTANEOUS
  Filled 2018-05-10: qty 1

## 2018-05-10 MED ORDER — POTASSIUM CHLORIDE 10 MEQ/100ML IV SOLN
10.0000 meq | INTRAVENOUS | Status: AC
  Administered 2018-05-10 (×4): 10 meq via INTRAVENOUS
  Filled 2018-05-10 (×4): qty 100

## 2018-05-10 MED ORDER — INSULIN ASPART 100 UNIT/ML ~~LOC~~ SOLN
0.0000 [IU] | Freq: Three times a day (TID) | SUBCUTANEOUS | Status: DC
  Administered 2018-05-10 (×2): 5 [IU] via SUBCUTANEOUS
  Administered 2018-05-10: 8 [IU] via SUBCUTANEOUS
  Administered 2018-05-11: 5 [IU] via SUBCUTANEOUS
  Filled 2018-05-10 (×4): qty 1

## 2018-05-10 MED ORDER — INSULIN ASPART 100 UNIT/ML ~~LOC~~ SOLN
6.0000 [IU] | Freq: Once | SUBCUTANEOUS | Status: AC
  Administered 2018-05-10: 6 [IU] via SUBCUTANEOUS
  Filled 2018-05-10: qty 1

## 2018-05-10 MED ORDER — ENSURE MAX PROTEIN PO LIQD
11.0000 [oz_av] | Freq: Two times a day (BID) | ORAL | Status: DC
  Administered 2018-05-10: 11 [oz_av] via ORAL
  Filled 2018-05-10: qty 330

## 2018-05-10 NOTE — Progress Notes (Signed)
Spoke with Dr. Posey Pronto pt with phosphorus level less than 1. MD to place orders.

## 2018-05-10 NOTE — Plan of Care (Signed)

## 2018-05-10 NOTE — Progress Notes (Signed)
Initial Nutrition Assessment  DOCUMENTATION CODES:   Not applicable  INTERVENTION:  Provide Ensure Max Protein po BID, each supplement provides 150 kcal and 30 grams of protein.  Encouraged adequate intake of protein at meals.  Continue MVI daily, thiamine 585 mg daily, folic acid 1 mg daily.  Patient appears to be experiencing refeeding syndrome in setting of EtOH abuse. Continue monitoring potassium, phosphorus, and magnesium daily and replacing as needed.  NUTRITION DIAGNOSIS:   Inadequate oral intake related to decreased appetite, early satiety as evidenced by per patient/family report.  GOAL:   Patient will meet greater than or equal to 90% of their needs  MONITOR:   PO intake, Supplement acceptance, Labs, Weight trends, I & O's  REASON FOR ASSESSMENT:   Malnutrition Screening Tool    ASSESSMENT:   48 year old male with PMHx of CAD, HLD, DM, EtOH abuse who was admitted with HHS, lactic acidosis, acute renal failure.   Met with patient at bedside. He reports he has had a decreased appetite and intake for months now. He endorses early satiety, nausea, occasional constipation. It has not affected his weight (reports he is weight-stable). He reports he still tries to eat 2-3 meals per day but is only able to finish about 50% of his meals now. He enjoys oral nutrition supplements and is willing to drink some here to help meet calorie/protein needs.  Patient reports he is weight-stable at 180 lbs. Currently 82.1 kg (181 lbs).  Medications reviewed and include: carvedilol, folic acid 1 mg daily, Novolog 0-15 units TID, Novolog 0-5 units QHS, MVI daily, pantoprazole, pyridoxine 100 mg daily IV, thiamine 100 mg daily.  Labs reviewed: CBG 190-271, Sodium 134, Potassium 3.1, CO2 21, Phosphorus <1 (pt received KPhos 45 mmol IV today).  Patient does not meet criteria for malnutrition at this time.  NUTRITION - FOCUSED PHYSICAL EXAM:    Most Recent Value  Orbital Region  No  depletion  Upper Arm Region  No depletion  Thoracic and Lumbar Region  No depletion  Buccal Region  No depletion  Temple Region  No depletion  Clavicle Bone Region  No depletion  Clavicle and Acromion Bone Region  No depletion  Scapular Bone Region  No depletion  Dorsal Hand  No depletion  Patellar Region  No depletion  Anterior Thigh Region  No depletion  Posterior Calf Region  No depletion  Edema (RD Assessment)  None  Hair  Reviewed  Eyes  Reviewed  Mouth  Reviewed  Skin  Reviewed  Nails  Reviewed     Diet Order:   Diet Order            Diet Carb Modified Fluid consistency: Thin; Room service appropriate? Yes  Diet effective now             EDUCATION NEEDS:   Education needs have been addressed  Skin:  Skin Assessment: Reviewed RN Assessment  Last BM:  05/09/2018 - medium type 6  Height:   Ht Readings from Last 1 Encounters:  05/09/18 _0  (1.676 m)   Weight:   Wt Readings from Last 1 Encounters:  05/09/18 82.1 kg   Ideal Body Weight:  64.5 kg  BMI:  Body mass index is 29.21 kg/m.  Estimated Nutritional Needs:   Kcal:  2000-2200  Protein:  100-110 grams  Fluid:  2-2.2 L/day  Willey Blade, MS, RD, LDN Office: [phone] Pager: [phone] After Hours/Weekend Pager: [phone]

## 2018-05-10 NOTE — Progress Notes (Signed)
Pharmacy Electrolyte Monitoring Consult:  Pharmacy consulted to assist in monitoring and replacing electrolytes in this 48 y.o. male admitted on 05/08/2018 with Shortness of Breath   Labs:  Sodium (mmol/L)  Date Value  05/10/2018 134 (L)  08/12/2013 136   Potassium (mmol/L)  Date Value  05/10/2018 3.1 (L)  08/12/2013 3.8   Magnesium (mg/dL)  Date Value  05/09/2018 2.0   Phosphorus (mg/dL)  Date Value  05/10/2018 <1.0 (LL)   Calcium (mg/dL)  Date Value  05/10/2018 7.9 (L)   Calcium, Total (mg/dL)  Date Value  08/12/2013 8.8   Albumin (g/dL)  Date Value  05/10/2018 3.2 (L)  12/15/2013 4.7  08/10/2013 4.2    Assessment/Plan: Patient is in DKA as evidenced by pH < 7.0, bicarb < 15, beta hydroxy butyrate +, and BG > 250. On BMP patient has multiple electrolytes abnormalities including hyponatremia, hypokalemia, although mag phos WNL.  K+ > 4.0; keep Mg > 2.0, Phos 2.5 - 4.5   02/16 @ 0100 K 3.1, Phos < 1.0. Will replace with Kphos 45 mmol IV x 1 over 6 hours. Will recheck renal function panel @ 2300 02/16.  Tobie Lords, PharmD, BCPS Clinical Pharmacist 05/10/2018

## 2018-05-10 NOTE — Progress Notes (Signed)
Inpatient Diabetes Program Recommendations  AACE/ADA: New Consensus Statement on Inpatient Glycemic Control (2015)  Target Ranges:  Prepandial:   less than 140 mg/dL      Peak postprandial:   less than 180 mg/dL (1-2 hours)      Critically ill patients:  140 - 180 mg/dL   Lab Results  Component Value Date   GLUCAP 207 (H) 05/10/2018   HGBA1C 9.2 (H) 03/26/2018    Review of Glycemic Control Results for Christian [name], Christian [name] (MRN [id number]) as of 05/10/2018 13:16  Ref. Range 05/09/2018 20:19 05/09/2018 23:53 05/10/2018 03:55 05/10/2018 08:03 05/10/2018 11:32  Glucose-Capillary Latest Ref Range: 70 - 99 mg/dL 215 (H) 220 (H) 190 (H) 271 (H) 207 (H)   Diabetes history: DM2 Outpatient Diabetes medications: Per chart not taking Metformin 1 gm bid + Glucotrol 10 mg Current orders for Inpatient glycemic control: Nolovolog moderate correction tid + hs  Inpatient Diabetes Program Recommendations:   -Lantus 12 units daily (0.15 units/kg x 82.1 kg) -Novolog 4 units tid meal coverage if eats 50%  Thank you, Bethena Roys E. Enmanuel Zufall, RN, MSN, CDE  Diabetes Coordinator Inpatient Glycemic Control Team Team Pager [phone] (8am-5pm) 05/10/2018 1:22 PM

## 2018-05-10 NOTE — Plan of Care (Signed)
  Problem: Education: Goal: Knowledge of General Education information will improve Description Including pain rating scale, medication(s)/side effects and non-pharmacologic comfort measures Outcome: Progressing   Problem: Clinical Measurements: Goal: Will remain free from infection Outcome: Progressing Goal: Cardiovascular complication will be avoided Outcome: Progressing   Problem: Activity: Goal: Risk for activity intolerance will decrease Outcome: Progressing   Problem: Nutrition: Goal: Adequate nutrition will be maintained Outcome: Progressing   Problem: Coping: Goal: Level of anxiety will decrease Outcome: Progressing   Problem: Elimination: Goal: Will not experience complications related to bowel motility Outcome: Progressing Goal: Will not experience complications related to urinary retention Outcome: Progressing

## 2018-05-10 NOTE — Progress Notes (Signed)
Pharmacy Electrolyte Monitoring Consult:  Pharmacy consulted to assist in monitoring and replacing electrolytes in this 48 y.o. male admitted on 05/08/2018 with Shortness of Breath   Labs:  Sodium (mmol/L)  Date Value  05/10/2018 135  08/12/2013 136   Potassium (mmol/L)  Date Value  05/10/2018 3.0 (L)  08/12/2013 3.8   Magnesium (mg/dL)  Date Value  05/09/2018 2.0   Phosphorus (mg/dL)  Date Value  05/10/2018 2.6   Calcium (mg/dL)  Date Value  05/10/2018 7.3 (L)   Calcium, Total (mg/dL)  Date Value  08/12/2013 8.8   Albumin (g/dL)  Date Value  05/10/2018 3.1 (L)  12/15/2013 4.7  08/10/2013 4.2    Assessment/Plan: Patient is in DKA as evidenced by pH < 7.0, bicarb < 15, beta hydroxy butyrate +, and BG > 250. On BMP patient has multiple electrolytes abnormalities including hyponatremia, hypokalemia, although mag phos WNL.  K+ > 4.0; keep Mg > 2.0, Phos 2.5 - 4.5   Following Kphos 45 mmol IV this morning the phosphorous is now replteed. The patient is still hypokalemic. Dr Brett Albino has ordered an additional 40 mEq of IV KCl. Will recheck electrolytes again with tomorrow morning's labs and replace if needed.  Vallery Sa, PharmD Clinical Pharmacist 05/10/2018

## 2018-05-10 NOTE — Progress Notes (Signed)
Pt have 2 consecutive FBS in the 200's spoke with Dr. Jodell Cipro. MD to place order for 6 more units of novolog.

## 2018-05-10 NOTE — Progress Notes (Signed)
[place] [facility] [place] NAME: Christian [name]    MR#:  [id number]  DATE OF BIRTH:  Sep 29, 1970  SUBJECTIVE:   States he is feeling better this morning.  Nausea and abdominal pain have improved.  He still feels pretty tired.  REVIEW OF SYSTEMS:  Review of Systems  Constitutional: Positive for malaise/fatigue. Negative for chills and fever.  HENT: Negative for congestion and sore throat.   Eyes: Negative for blurred vision and double vision.  Respiratory: Negative for cough and shortness of breath.   Cardiovascular: Negative for chest pain and palpitations.  Gastrointestinal: Negative for abdominal pain, nausea and vomiting.  Genitourinary: Negative for dysuria and urgency.  Musculoskeletal: Negative for back pain and neck pain.  Neurological: Negative for dizziness and headaches.  Psychiatric/Behavioral: Negative for depression. The patient is not nervous/anxious.     DRUG ALLERGIES:  No Known Allergies VITALS:  Blood pressure 129/90, pulse 95, temperature 99.1 F (37.3 C), temperature source Oral, resp. rate 17, height 5\' 6"  (1.676 m), weight 82.1 kg, SpO2 100 %. PHYSICAL EXAMINATION:  Physical Exam  General:  Well-appearing, sitting up on edge of bed in no acute distress HEENT:  Cephalic, atraumatic, EOMI, no scleral icterus, moist mucous membranes Cardiovascular:  RRR, no murmurs, rubs, gallops Lungs: Lungs clear to auscultation bilaterally, normal work of breathing Abdomen:  Nontender, nondistended, soft, no rebound or guarding, +BS Musculoskeletal:  No pedal edema, cyanosis, clubbing Neuro:  Cranial nerves II through XII grossly intact, no focal deficits, +global weakness Psych: Alert and oriented x3, flat affect Skin: Warm and dry, no rashes or lesions LABORATORY PANEL:  Male CBC Recent Labs  Lab 05/10/18 1156  WBC 6.4  HGB 9.7*  HCT 27.0*  PLT 127*    ------------------------------------------------------------------------------------------------------------------ Chemistries  Recent Labs  Lab 05/09/18 0100  05/10/18 0108  NA 131*   < > 134*  K 3.0*   < > 3.1*  CL 100   < > 103  CO2 8*   < > 21*  GLUCOSE 468*   < > 184*  BUN 10   < > 8  CREATININE 1.38*   < > 0.87  CALCIUM 7.9*   < > 7.9*  MG 2.0  --   --   AST  --   --  15  ALT  --   --  12  ALKPHOS  --   --  58  BILITOT  --   --  1.2   < > = values in this interval not displayed.   RADIOLOGY:  No results found. ASSESSMENT AND PLAN:   Uncontrolled type 2 diabetes- blood sugars have been stable off insulin drip yesterday afternoon.  Recent A1c 9.2%. -Continue Levemir 18 units twice daily and sliding scale insulin -Stop IV fluids  Hypokalemia/hypomagnesemia/hypophosphatemia -Replete and recheck  CAD- stable, no active chest pain -Continue aspirin, Coreg, Crestor  Alcohol abuse -CIWA -Thiamine, folate, multivitamin  Normocytic anemia/thrombocytopenia- Hgb has dropped from 15.6 > 9.7. Platelets have dropped from 445 > 127. Likely dilutional component, as all cell lines have dropped. No active bleeding. -Stop lovenox for now -Check anemia panel, peripheral smear, FOBT  All the records are reviewed and case discussed with Care Management/Social Worker. Management plans discussed with the patient, family and they are in agreement.  CODE STATUS: Full Code  TOTAL TIME TAKING CARE OF THIS PATIENT: 40 minutes.   More than 50% of the time was spent in counseling/coordination of care: YES  POSSIBLE D/C  IN 2-3 DAYS, DEPENDING ON CLINICAL CONDITION.   Berna Spare Harue Pribble M.D on 05/10/2018 at 1:23 PM  Between 7am to 6pm - Pager - [phone]  After 6pm go to [email or web address] - Proofreader  [facility] [place]  Office  [phone]  CC: Primary care physician; Patient, No Pcp Per  Note: This dictation was prepared with Dragon dictation along  with smaller phrase technology. Any transcriptional errors that result from this process are unintentional.

## 2018-05-11 LAB — CBC WITH DIFFERENTIAL/PLATELET
Abs Immature Granulocytes: 0.06 10*3/uL (ref 0.00–0.07)
Basophils Absolute: 0.1 10*3/uL (ref 0.0–0.1)
Basophils Relative: 1 %
Eosinophils Absolute: 0.1 10*3/uL (ref 0.0–0.5)
Eosinophils Relative: 1 %
HCT: 28.5 % — ABNORMAL LOW (ref 39.0–52.0)
HEMOGLOBIN: 10.1 g/dL — AB (ref 13.0–17.0)
Immature Granulocytes: 1 %
LYMPHS ABS: 1.7 10*3/uL (ref 0.7–4.0)
Lymphocytes Relative: 25 %
MCH: 32.2 pg (ref 26.0–34.0)
MCHC: 35.4 g/dL (ref 30.0–36.0)
MCV: 90.8 fL (ref 80.0–100.0)
Monocytes Absolute: 0.6 10*3/uL (ref 0.1–1.0)
Monocytes Relative: 9 %
Neutro Abs: 4.3 10*3/uL (ref 1.7–7.7)
Neutrophils Relative %: 63 %
Platelets: 169 10*3/uL (ref 150–400)
RBC: 3.14 MIL/uL — ABNORMAL LOW (ref 4.22–5.81)
RDW: 15 % (ref 11.5–15.5)
WBC: 6.8 10*3/uL (ref 4.0–10.5)
nRBC: 0 % (ref 0.0–0.2)

## 2018-05-11 LAB — IRON AND TIBC
Iron: 20 ug/dL — ABNORMAL LOW (ref 45–182)
Saturation Ratios: 8 % — ABNORMAL LOW (ref 17.9–39.5)
TIBC: 241 ug/dL — ABNORMAL LOW (ref 250–450)
UIBC: 221 ug/dL

## 2018-05-11 LAB — PROCALCITONIN: Procalcitonin: 1.16 ng/mL

## 2018-05-11 LAB — GLUCOSE, CAPILLARY
Glucose-Capillary: 163 mg/dL — ABNORMAL HIGH (ref 70–99)
Glucose-Capillary: 129 mg/dL — ABNORMAL HIGH (ref 70–99)
Glucose-Capillary: 214 mg/dL — ABNORMAL HIGH (ref 70–99)

## 2018-05-11 LAB — POTASSIUM: Potassium: 3.2 mmol/L — ABNORMAL LOW (ref 3.5–5.1)

## 2018-05-11 LAB — RETICULOCYTES
Immature Retic Fract: 21.4 % — ABNORMAL HIGH (ref 2.3–15.9)
RBC.: 3.16 MIL/uL — ABNORMAL LOW (ref 4.22–5.81)
RETIC CT PCT: 3.6 % — AB (ref 0.4–3.1)
Retic Count, Absolute: 112.5 10*3/uL (ref 19.0–186.0)

## 2018-05-11 LAB — HEMOGLOBIN A1C
HEMOGLOBIN A1C: 9.5 % — AB (ref 4.8–5.6)
Mean Plasma Glucose: 226 mg/dL

## 2018-05-11 LAB — VITAMIN B12: Vitamin B-12: 246 pg/mL (ref 180–914)

## 2018-05-11 LAB — FERRITIN: FERRITIN: 756 ng/mL — AB (ref 24–336)

## 2018-05-11 LAB — PHOSPHORUS: Phosphorus: 3.1 mg/dL (ref 2.5–4.6)

## 2018-05-11 LAB — MAGNESIUM: Magnesium: 1.4 mg/dL — ABNORMAL LOW (ref 1.7–2.4)

## 2018-05-11 LAB — PATHOLOGIST SMEAR REVIEW

## 2018-05-11 LAB — FOLATE: Folate: 12.8 ng/mL (ref >5.9)

## 2018-05-11 MED ORDER — POTASSIUM CHLORIDE CRYS ER 20 MEQ PO TBCR
40.0000 meq | EXTENDED_RELEASE_TABLET | ORAL | Status: AC
  Administered 2018-05-11: 40 meq via ORAL
  Filled 2018-05-11: qty 2

## 2018-05-11 MED ORDER — ASPIRIN 81 MG PO CHEW: 81.0000 mg | CHEWABLE_TABLET | Freq: Every day | ORAL | 0 refills | Status: DC

## 2018-05-11 MED ORDER — MAGNESIUM SULFATE 2 GM/50ML IV SOLN: 2.0000 g | Freq: Once | INTRAVENOUS | Status: DC

## 2018-05-11 MED ORDER — GLIPIZIDE ER 10 MG PO TB24
10.0000 mg | ORAL_TABLET | Freq: Every day | ORAL | Status: DC
  Administered 2018-05-11: 10 mg via ORAL
  Filled 2018-05-11: qty 1

## 2018-05-11 NOTE — Progress Notes (Addendum)
Inpatient Diabetes Program Recommendations  AACE/ADA: New Consensus Statement on Inpatient Glycemic Control  Target Ranges:  Prepandial:   less than 140 mg/dL      Peak postprandial:   less than 180 mg/dL (1-2 hours)      Critically ill patients:  140 - 180 mg/dL  Results for Christian [name], Christian [name] (MRN [id number]) as of 05/11/2018 06:41  Ref. Range 05/10/2018 08:03 05/10/2018 11:32 05/10/2018 16:47 05/10/2018 21:04  Glucose-Capillary Latest Ref Range: 70 - 99 mg/dL 271 (H) 207 (H) 229 (H) 216 (H)  Results for Christian [name], Christian [name] (MRN [id number]) as of 05/11/2018 06:41  Ref. Range 03/26/2018 18:05  Hemoglobin A1C Latest Ref Range: 4.8 - 5.6 % 9.2 (H)    Review of Glycemic Control  Diabetes history: DM2 Outpatient Diabetes medications: Glipizide 10 mg BID, Metformin 1000 mg BID (per home med list - patient is not taking Glipizide or Metformin) Current orders for Inpatient glycemic control: Novolog 0-15 units TID with meals, Novolog 0-5 units QHS  Inpatient Diabetes Program Recommendations:  Insulin - Basal: In reviewing chart, noted patient only received Levemir 18 units once (on 05/09/18 @ 16:33) since he was transitioned off IV insulin drip. No Levemir is ordered at this time.  Oral Agents: Please consider ordering Glipizide 10 mg BID.  HgbA1C: A1C was 9.2% on 03/26/18 indicating an average glucose of 217 mg/dl.  NOTE:. Patient has no insurance and per home medication list, he has not been taking DM medications.  Patient was recently inpatient 03/26/18 to 03/27/18 and when discharged on 03/27/18, patient was prescribed Glipizide and Metformin and [facility]. Was planning to see patient today but was discharged home this morning.  Thanks, Barnie Alderman, RN, MSN, CDE Diabetes Coordinator Inpatient Diabetes Program [phone] (Team Pager from 8am to 5pm)

## 2018-05-11 NOTE — Progress Notes (Signed)
Discharge summary reviewed with verbal understanding. Belongings given upon discharge.

## 2018-05-11 NOTE — Progress Notes (Signed)
Pharmacy Electrolyte Monitoring Consult:  Pharmacy consulted to assist in monitoring and replacing electrolytes in this 48 y.o. male admitted on 05/08/2018 with Shortness of Breath   Labs:  Sodium (mmol/L)  Date Value  05/10/2018 135  08/12/2013 136   Potassium (mmol/L)  Date Value  05/11/2018 3.2 (L)  08/12/2013 3.8   Magnesium (mg/dL)  Date Value  05/11/2018 1.4 (L)   Phosphorus (mg/dL)  Date Value  05/11/2018 3.1   Calcium (mg/dL)  Date Value  05/10/2018 7.3 (L)   Calcium, Total (mg/dL)  Date Value  08/12/2013 8.8   Albumin (g/dL)  Date Value  05/10/2018 3.1 (L)  12/15/2013 4.7  08/10/2013 4.2    Assessment/Plan: Patient is in DKA as evidenced by pH < 7.0, bicarb < 15, beta hydroxy butyrate +, and BG > 250. On BMP patient has multiple electrolytes abnormalities including hyponatremia, hypokalemia, although mag phos WNL.  K+ > 4.0; keep Mg > 2.0, Phos 2.5 - 4.5   02/15 K 3.2; Mg 1.4 will replace w/ mag 2g IV x 1 and KCI 40 mEq PO x 2 and will recheck w/ am labs.  Tobie Lords, PharmD, BCPS Clinical Pharmacist 05/11/2018

## 2018-05-11 NOTE — Discharge Summary (Signed)
[facility] NAME: Christian [name]    MR#:  [id number]  DATE OF BIRTH:  11-Nov-1970  DATE OF ADMISSION:  05/08/2018   ADMITTING PHYSICIAN: Arta Silence, MD  DATE OF DISCHARGE: 05/11/18  PRIMARY CARE PHYSICIAN: Christian [name], No Pcp Per   ADMISSION DIAGNOSIS:  AKI (acute kidney injury) ([place]) [N17.9] Diabetic ketoacidosis without coma associated with type 2 diabetes mellitus ([place]) [E11.10] DISCHARGE DIAGNOSIS:  Active Problems:   Hyperosmolar non-ketotic state in Christian [name] with type 2 diabetes mellitus ([place])  SECONDARY DIAGNOSIS:   Past Medical History:  Diagnosis Date  . CAD (coronary artery disease)    a. 07/2013 NSTEMI/PCI: LCX 36m (4.0x23 Xience DES), RCA 86m, EF > 55%;  b. 07/2013 Echo: EF 60-65%, diast dysfxn, mild LVH, mildly dil LA, nl RV size/fxn, nl RVSP.  Marland Kitchen History of tobacco abuse    a. quit ~ 2010  . Hyperlipidemia   . Morbid obesity ([place])    a. weighed 168 @ age 75.   HOSPITAL COURSE:   Christian [name] is a 48 year old male who presented to the ED with confusion and poor p.o. intake.  In the ED he was tachycardic and tachypneic.  Glucose was 728.  He was started on insulin drip and admitted to stepdown unit.  Uncontrolled type 2 diabetes- likely due to not taking diabetes meds at home. Recent A1c 9.2%.  - Initially with HHS and on insulin drip -Blood sugar stable off insulin drip -Discharged home with glipizide and metformin  Hypokalemia/hypomagnesemia/hypophosphatemia -Repleted -Needs to be rechecked as an outpatient  CAD- stable, no active chest pain -Continued aspirin, Coreg, Crestor  Alcohol abuse -Given thiamine, folate, multivitamin while hospitalized -Alcohol cessation counseling provided this admission  Normocytic anemia- hemoglobin dropped to 10.1 this admission, likely dilutional component, as all cell lines dropped.  No signs of active bleeding. -Ferritin was high, remainder of anemia panel  unremarkable -Reticulocyte count was high, suggesting that bone marrow is reacting appropriately -Peripheral smear pending on discharge -Unable to obtain FOBT due to lack of bowel movement -Needs CBC rechecked as an outpatient  DISCHARGE CONDITIONS:  Uncontrolled type 2 diabetes Hypokalemia Hypomagnesemia CAD Alcohol abuse Normocytic anemia CONSULTS OBTAINED:  Treatment Team:  Arta Silence, MD DRUG ALLERGIES:  No Known Allergies DISCHARGE MEDICATIONS:   Allergies as of 05/11/2018   No Known Allergies     Medication List    STOP taking these medications   carisoprodol 350 MG tablet Commonly known as:  SOMA   ibuprofen 400 MG tablet Commonly known as:  ADVIL,MOTRIN   potassium chloride SA 20 MEQ tablet Commonly known as:  K-DUR,KLOR-CON     TAKE these medications   acetaminophen 500 MG tablet Commonly known as:  TYLENOL Take 1,000 mg by mouth every 6 (six) hours as needed.   aspirin 81 MG chewable tablet Chew 1 tablet (81 mg total) by mouth daily.   carvedilol 25 MG tablet Commonly known as:  COREG Take 1 tablet (25 mg total) by mouth 2 (two) times daily.   glipiZIDE 10 MG tablet Commonly known as:  GLUCOTROL Take 1 tablet (10 mg total) by mouth 2 (two) times daily.   losartan 100 MG tablet Commonly known as:  COZAAR Take 1 tablet (100 mg total) by mouth daily.   metFORMIN 1000 MG tablet Commonly known as:  GLUCOPHAGE Take 1 tablet (1,000 mg total) by mouth 2 (two) times daily with a meal.   rosuvastatin 10 MG tablet Commonly known as:  CRESTOR  Take 1 tablet (10 mg total) by mouth daily.        DISCHARGE INSTRUCTIONS:  1.  Follow-up with PCP in 5 days 2.  Follow-up with cardiology in 1 to 2 weeks 3.  Needs CBC and BMP rechecked as an outpatient 4.  Discharged home on glipizide and metformin, needs blood sugars closely monitored as an outpatient DIET:  Cardiac diet and Diabetic diet DISCHARGE CONDITION:  Stable ACTIVITY:  Activity as  tolerated OXYGEN:  Home Oxygen: No.  Oxygen Delivery: room air DISCHARGE LOCATION:  home   If you experience worsening of your admission symptoms, develop shortness of breath, life threatening emergency, suicidal or homicidal thoughts you must seek medical attention immediately by calling 911 or calling your MD immediately  if symptoms less severe.  You Must read complete instructions/literature along with all the possible adverse reactions/side effects for all the Medicines you take and that have been prescribed to you. Take any new Medicines after you have completely understood and accpet all the possible adverse reactions/side effects.   Please note  You were cared for by a hospitalist during your hospital stay. If you have any questions about your discharge medications or the care you received while you were in the hospital after you are discharged, you can call the unit and asked to speak with the hospitalist on call if the hospitalist that took care of you is not available. Once you are discharged, your primary care physician will handle any further medical issues. Please note that NO REFILLS for any discharge medications will be authorized once you are discharged, as it is imperative that you return to your primary care physician (or establish a relationship with a primary care physician if you do not have one) for your aftercare needs so that they can reassess your need for medications and monitor your lab values.    On the day of Discharge:  VITAL SIGNS:  Blood pressure 123/87, pulse 92, temperature 99.1 F (37.3 C), temperature source Oral, resp. rate 18, height 5\' 6"  (1.676 m), weight 82.1 kg, SpO2 97 %. PHYSICAL EXAMINATION:  GENERAL:  48 y.o.-year-old Christian [name] sitting up in the bed with no acute distress.  EYES: Pupils equal, round, reactive to light and accommodation. No scleral icterus. Extraocular muscles intact.  HEENT: Head atraumatic, normocephalic. Oropharynx and  nasopharynx clear.  NECK:  Supple, no jugular venous distention. No thyroid enlargement, no tenderness.  LUNGS: Normal breath sounds bilaterally, no wheezing, rales,rhonchi or crepitation. No use of accessory muscles of respiration.  CARDIOVASCULAR: RRR, S1, S2 normal. No murmurs, rubs, or gallops.  ABDOMEN: Soft, non-tender, non-distended. Bowel sounds present. No organomegaly or mass.  EXTREMITIES: No pedal edema, cyanosis, or clubbing.  NEUROLOGIC: Cranial nerves II through XII are intact. Muscle strength 5/5 in all extremities. Sensation intact. Gait not checked.  PSYCHIATRIC: The Christian [name] is alert and oriented x 3.  SKIN: No obvious rash, lesion, or ulcer.  DATA REVIEW:   CBC Recent Labs  Lab 05/11/18 0323  WBC 6.8  HGB 10.1*  HCT 28.5*  PLT 169    Chemistries  Recent Labs  Lab 05/10/18 0108 05/10/18 1350 05/11/18 0323  NA 134* 135  --   K 3.1* 3.0* 3.2*  CL 103 105  --   CO2 21* 20*  --   GLUCOSE 184* 237*  --   BUN 8 7  --   CREATININE 0.87 0.73  --   CALCIUM 7.9* 7.3*  --   MG  --   --  1.4*  AST 15  --   --   ALT 12  --   --   ALKPHOS 58  --   --   BILITOT 1.2  --   --      Microbiology Results  Results for orders placed or performed during the hospital encounter of 05/08/18  MRSA PCR Screening     Status: None   Collection Time: 05/09/18 12:19 AM  Result Value Ref Range Status   MRSA by PCR NEGATIVE NEGATIVE Final    Comment:        The GeneXpert MRSA Assay (FDA approved for NASAL specimens only), is one component of a comprehensive MRSA colonization surveillance program. It is not intended to diagnose MRSA infection nor to guide or monitor treatment for MRSA infections. Performed at [facility], [address] [place], [place] [address]     RADIOLOGY:  No results found.   Management plans discussed with the Christian [name], family and they are in agreement.  CODE STATUS: Full Code   TOTAL TIME TAKING CARE OF THIS Christian [name]: 45  minutes.    Berna Spare Shahd Occhipinti M.D on 05/11/2018 at 10:27 AM  Between 7am to 6pm - Pager - [phone]  After 6pm go to [email or web address] - Proofreader  [facility]  Office  [phone]  CC: Primary care physician; Christian [name], No Pcp Per   Note: This dictation was prepared with Dragon dictation along with smaller phrase technology. Any transcriptional errors that result from this process are unintentional.

## 2018-05-11 NOTE — Discharge Instructions (Signed)
It was so nice to meet you during this hospitalization!  You came into the hospital with very high blood sugars. Please make sure you are taking Glipizide 10mg  twice daily and Metformin 1000mg  twice daily at home to help keep your blood sugars down.  Please work on trying to see your mom's primary care doctor. It is very important that you have a physician that can help manage your diabetes. You should also see your cardiologist in the next couple of weeks.  Take care, Dr. Brett Albino

## 2018-05-29 ENCOUNTER — Inpatient Hospital Stay
Admission: AD | Admit: 2018-05-29 | Discharge: 2018-06-04 | DRG: 885 | Disposition: A | Discharge status: Home / Self Care | Payer: No Typology Code available for payment source | Source: Intra-hospital | Attending: Psychiatry | Admitting: Psychiatry

## 2018-05-29 ENCOUNTER — Emergency Department
Admission: EM | Admit: 2018-05-29 | Discharge: 2018-05-29 | Disposition: A | Discharge status: Other Acute Inpatient Hospital | Payer: No Typology Code available for payment source | Attending: Emergency Medicine | Admitting: Emergency Medicine

## 2018-05-29 ENCOUNTER — Other Ambulatory Visit: Payer: Self-pay

## 2018-05-29 ENCOUNTER — Encounter: Payer: Self-pay | Admitting: Psychiatry

## 2018-05-29 ENCOUNTER — Encounter: Payer: Self-pay | Admitting: Emergency Medicine

## 2018-05-29 DIAGNOSIS — Z8249 Family history of ischemic heart disease and other diseases of the circulatory system: Secondary | ICD-10-CM

## 2018-05-29 DIAGNOSIS — R45851 Suicidal ideations: Secondary | ICD-10-CM | POA: Diagnosis present

## 2018-05-29 DIAGNOSIS — Z7982 Long term (current) use of aspirin: Secondary | ICD-10-CM | POA: Insufficient documentation

## 2018-05-29 DIAGNOSIS — E119 Type 2 diabetes mellitus without complications: Secondary | ICD-10-CM | POA: Diagnosis present

## 2018-05-29 DIAGNOSIS — Z7984 Long term (current) use of oral hypoglycemic drugs: Secondary | ICD-10-CM | POA: Insufficient documentation

## 2018-05-29 DIAGNOSIS — Z560 Unemployment, unspecified: Secondary | ICD-10-CM | POA: Diagnosis not present

## 2018-05-29 DIAGNOSIS — I10 Essential (primary) hypertension: Secondary | ICD-10-CM | POA: Diagnosis present

## 2018-05-29 DIAGNOSIS — Z87891 Personal history of nicotine dependence: Secondary | ICD-10-CM

## 2018-05-29 DIAGNOSIS — F332 Major depressive disorder, recurrent severe without psychotic features: Secondary | ICD-10-CM | POA: Diagnosis present

## 2018-05-29 DIAGNOSIS — F1010 Alcohol abuse, uncomplicated: Secondary | ICD-10-CM

## 2018-05-29 DIAGNOSIS — M419 Scoliosis, unspecified: Secondary | ICD-10-CM | POA: Diagnosis present

## 2018-05-29 DIAGNOSIS — F32A Depression, unspecified: Secondary | ICD-10-CM

## 2018-05-29 DIAGNOSIS — E785 Hyperlipidemia, unspecified: Secondary | ICD-10-CM | POA: Diagnosis present

## 2018-05-29 DIAGNOSIS — I259 Chronic ischemic heart disease, unspecified: Secondary | ICD-10-CM | POA: Insufficient documentation

## 2018-05-29 DIAGNOSIS — Z599 Problem related to housing and economic circumstances, unspecified: Secondary | ICD-10-CM | POA: Diagnosis not present

## 2018-05-29 DIAGNOSIS — Z79899 Other long term (current) drug therapy: Secondary | ICD-10-CM

## 2018-05-29 DIAGNOSIS — I252 Old myocardial infarction: Secondary | ICD-10-CM | POA: Diagnosis not present

## 2018-05-29 DIAGNOSIS — Z800 Family history of malignant neoplasm of digestive organs: Secondary | ICD-10-CM

## 2018-05-29 DIAGNOSIS — F329 Major depressive disorder, single episode, unspecified: Secondary | ICD-10-CM

## 2018-05-29 DIAGNOSIS — I2510 Atherosclerotic heart disease of native coronary artery without angina pectoris: Secondary | ICD-10-CM | POA: Diagnosis present

## 2018-05-29 DIAGNOSIS — F1020 Alcohol dependence, uncomplicated: Secondary | ICD-10-CM

## 2018-05-29 DIAGNOSIS — Z818 Family history of other mental and behavioral disorders: Secondary | ICD-10-CM

## 2018-05-29 LAB — CBC
HCT: 35.9 % — ABNORMAL LOW (ref 39.0–52.0)
Hemoglobin: 12.4 g/dL — ABNORMAL LOW (ref 13.0–17.0)
MCH: 31 pg (ref 26.0–34.0)
MCHC: 34.5 g/dL (ref 30.0–36.0)
MCV: 89.8 fL (ref 80.0–100.0)
Platelets: 215 10*3/uL (ref 150–400)
RBC: 4 MIL/uL — ABNORMAL LOW (ref 4.22–5.81)
RDW: 14.4 % (ref 11.5–15.5)
WBC: 5.6 10*3/uL (ref 4.0–10.5)
nRBC: 0 % (ref 0.0–0.2)

## 2018-05-29 LAB — COMPREHENSIVE METABOLIC PANEL
ALBUMIN: 4.7 g/dL (ref 3.5–5.0)
ALT: 25 U/L (ref 0–44)
AST: 21 U/L (ref 15–41)
Alkaline Phosphatase: 91 U/L (ref 38–126)
Anion gap: 17 — ABNORMAL HIGH (ref 5–15)
BUN: 6 mg/dL (ref 6–20)
CHLORIDE: 98 mmol/L (ref 98–111)
CO2: 18 mmol/L — ABNORMAL LOW (ref 22–32)
Calcium: 8.9 mg/dL (ref 8.9–10.3)
Creatinine, Ser: 0.7 mg/dL (ref 0.61–1.24)
GFR calc Af Amer: >60 mL/min (ref >60)
GFR calc non Af Amer: >60 mL/min (ref >60)
Glucose, Bld: 292 mg/dL — ABNORMAL HIGH (ref 70–99)
Potassium: 3.2 mmol/L — ABNORMAL LOW (ref 3.5–5.1)
Sodium: 133 mmol/L — ABNORMAL LOW (ref 135–145)
Total Bilirubin: 1 mg/dL (ref 0.3–1.2)
Total Protein: 8.2 g/dL — ABNORMAL HIGH (ref 6.5–8.1)

## 2018-05-29 LAB — URINE DRUG SCREEN, QUALITATIVE (ARMC ONLY)
AMPHETAMINES, UR SCREEN: NOT DETECTED
Barbiturates, Ur Screen: NOT DETECTED
Benzodiazepine, Ur Scrn: NOT DETECTED
Cannabinoid 50 Ng, Ur ~~LOC~~: NOT DETECTED
Cocaine Metabolite,Ur ~~LOC~~: NOT DETECTED
MDMA (Ecstasy)Ur Screen: NOT DETECTED
METHADONE SCREEN, URINE: NOT DETECTED
Opiate, Ur Screen: NOT DETECTED
Phencyclidine (PCP) Ur S: NOT DETECTED
Tricyclic, Ur Screen: NOT DETECTED

## 2018-05-29 LAB — GLUCOSE, CAPILLARY
GLUCOSE-CAPILLARY: 291 mg/dL — AB (ref 70–99)
GLUCOSE-CAPILLARY: 358 mg/dL — AB (ref 70–99)
Glucose-Capillary: 251 mg/dL — ABNORMAL HIGH (ref 70–99)
Glucose-Capillary: 341 mg/dL — ABNORMAL HIGH (ref 70–99)

## 2018-05-29 LAB — ETHANOL: Alcohol, Ethyl (B): 46 mg/dL — ABNORMAL HIGH (ref <10)

## 2018-05-29 LAB — SALICYLATE LEVEL: Salicylate Lvl: <7 mg/dL (ref 2.8–30.0)

## 2018-05-29 LAB — ACETAMINOPHEN LEVEL: Acetaminophen (Tylenol), Serum: <10 ug/mL — ABNORMAL LOW (ref 10–30)

## 2018-05-29 MED ORDER — IBUPROFEN 600 MG PO TABS: 600.0000 mg | ORAL_TABLET | Freq: Four times a day (QID) | ORAL | Status: DC | PRN

## 2018-05-29 MED ORDER — LOSARTAN POTASSIUM 50 MG PO TABS
100.0000 mg | ORAL_TABLET | Freq: Every day | ORAL | Status: DC
  Administered 2018-05-29: 100 mg via ORAL
  Filled 2018-05-29: qty 2

## 2018-05-29 MED ORDER — METFORMIN HCL 500 MG PO TABS
1000.0000 mg | ORAL_TABLET | Freq: Two times a day (BID) | ORAL | Status: DC
  Administered 2018-05-29 (×2): 1000 mg via ORAL
  Filled 2018-05-29 (×2): qty 2

## 2018-05-29 MED ORDER — INSULIN ASPART 100 UNIT/ML ~~LOC~~ SOLN
0.0000 [IU] | Freq: Three times a day (TID) | SUBCUTANEOUS | Status: DC
  Administered 2018-05-29: 9 [IU] via SUBCUTANEOUS
  Filled 2018-05-29: qty 1

## 2018-05-29 MED ORDER — GLIPIZIDE 10 MG PO TABS
10.0000 mg | ORAL_TABLET | Freq: Two times a day (BID) | ORAL | Status: DC
  Administered 2018-05-29 (×2): 10 mg via ORAL
  Filled 2018-05-29 (×3): qty 1

## 2018-05-29 MED ORDER — FLUOXETINE HCL 20 MG PO CAPS
20.0000 mg | ORAL_CAPSULE | Freq: Every day | ORAL | Status: DC
  Administered 2018-05-29: 20 mg via ORAL
  Filled 2018-05-29: qty 1

## 2018-05-29 MED ORDER — ALUM & MAG HYDROXIDE-SIMETH 200-200-20 MG/5ML PO SUSP: 30.0000 mL | ORAL | Status: DC | PRN

## 2018-05-29 MED ORDER — CARVEDILOL 12.5 MG PO TABS
25.0000 mg | ORAL_TABLET | Freq: Two times a day (BID) | ORAL | Status: DC
  Administered 2018-05-30 – 2018-06-03 (×6): 25 mg via ORAL
  Filled 2018-05-29 (×10): qty 2

## 2018-05-29 MED ORDER — TRAZODONE HCL 100 MG PO TABS
100.0000 mg | ORAL_TABLET | Freq: Every evening | ORAL | Status: DC | PRN
  Administered 2018-06-01: 100 mg via ORAL
  Filled 2018-05-29: qty 1

## 2018-05-29 MED ORDER — ASPIRIN 81 MG PO CHEW
81.0000 mg | CHEWABLE_TABLET | Freq: Every day | ORAL | Status: DC
  Administered 2018-05-30 – 2018-06-04 (×6): 81 mg via ORAL
  Filled 2018-05-29 (×6): qty 1

## 2018-05-29 MED ORDER — GLIPIZIDE 10 MG PO TABS
10.0000 mg | ORAL_TABLET | Freq: Two times a day (BID) | ORAL | Status: DC
  Administered 2018-05-30 – 2018-06-04 (×10): 10 mg via ORAL
  Filled 2018-05-29 (×13): qty 1

## 2018-05-29 MED ORDER — ACETAMINOPHEN 500 MG PO TABS: 1000.0000 mg | ORAL_TABLET | Freq: Four times a day (QID) | ORAL | Status: DC | PRN

## 2018-05-29 MED ORDER — ACETAMINOPHEN 325 MG PO TABS: 650.0000 mg | ORAL_TABLET | Freq: Four times a day (QID) | ORAL | Status: DC | PRN

## 2018-05-29 MED ORDER — FLUOXETINE HCL 20 MG PO CAPS
20.0000 mg | ORAL_CAPSULE | Freq: Every day | ORAL | Status: DC
  Administered 2018-05-30 – 2018-06-01 (×3): 20 mg via ORAL
  Filled 2018-05-29 (×3): qty 1

## 2018-05-29 MED ORDER — INSULIN ASPART 100 UNIT/ML ~~LOC~~ SOLN
0.0000 [IU] | Freq: Three times a day (TID) | SUBCUTANEOUS | Status: DC
  Administered 2018-05-30: 5 [IU] via SUBCUTANEOUS
  Administered 2018-05-30: 7 [IU] via SUBCUTANEOUS
  Administered 2018-05-30: 3 [IU] via SUBCUTANEOUS
  Administered 2018-05-31 (×3): 5 [IU] via SUBCUTANEOUS
  Administered 2018-06-01: 7 [IU] via SUBCUTANEOUS
  Administered 2018-06-01: 3 [IU] via SUBCUTANEOUS
  Administered 2018-06-01 – 2018-06-02 (×2): 2 [IU] via SUBCUTANEOUS
  Administered 2018-06-02: 1 [IU] via SUBCUTANEOUS
  Administered 2018-06-03: 2 [IU] via SUBCUTANEOUS
  Filled 2018-05-29 (×6): qty 1

## 2018-05-29 MED ORDER — ROSUVASTATIN CALCIUM 10 MG PO TABS
10.0000 mg | ORAL_TABLET | Freq: Every day | ORAL | Status: DC
  Administered 2018-05-30 – 2018-06-03 (×5): 10 mg via ORAL
  Filled 2018-05-29 (×8): qty 1

## 2018-05-29 MED ORDER — CARVEDILOL 25 MG PO TABS
25.0000 mg | ORAL_TABLET | Freq: Two times a day (BID) | ORAL | Status: DC
  Administered 2018-05-29 (×2): 25 mg via ORAL
  Filled 2018-05-29 (×2): qty 1

## 2018-05-29 MED ORDER — METFORMIN HCL 500 MG PO TABS
1000.0000 mg | ORAL_TABLET | Freq: Two times a day (BID) | ORAL | Status: DC
  Administered 2018-05-30 – 2018-06-04 (×11): 1000 mg via ORAL
  Filled 2018-05-29 (×11): qty 2

## 2018-05-29 MED ORDER — MAGNESIUM HYDROXIDE 400 MG/5ML PO SUSP: 30.0000 mL | Freq: Every day | ORAL | Status: DC | PRN

## 2018-05-29 MED ORDER — HYDROXYZINE HCL 50 MG PO TABS: 50.0000 mg | ORAL_TABLET | Freq: Three times a day (TID) | ORAL | Status: DC | PRN

## 2018-05-29 MED ORDER — ROSUVASTATIN CALCIUM 10 MG PO TABS
10.0000 mg | ORAL_TABLET | Freq: Every day | ORAL | Status: DC
  Administered 2018-05-29: 10 mg via ORAL
  Filled 2018-05-29: qty 1

## 2018-05-29 MED ORDER — ASPIRIN 81 MG PO CHEW
81.0000 mg | CHEWABLE_TABLET | Freq: Every day | ORAL | Status: DC
  Administered 2018-05-29: 81 mg via ORAL
  Filled 2018-05-29: qty 1

## 2018-05-29 MED ORDER — LOSARTAN POTASSIUM 50 MG PO TABS
100.0000 mg | ORAL_TABLET | Freq: Every day | ORAL | Status: DC
  Administered 2018-05-30 – 2018-06-03 (×4): 100 mg via ORAL
  Filled 2018-05-29 (×7): qty 2

## 2018-05-29 NOTE — ED Notes (Signed)
Patient's sister in to visit patient , visit supervised, no signs of distress.

## 2018-05-29 NOTE — BH Assessment (Signed)
Assessment Note  Christian [name] is an 48 y.o. male who presents to ED endorsing suicidal thoughts with intent, plan, and means. Pt reports  "I've been thinking about suicide. I plan to stand in my front yard and put a rifle under my chin and shoot". Pt reports having easy access to a rifle gun that is located in his closet at home. Pt further reported "I almost did it yesterday" (referring to following through with his suicide plan). Pt currently lives with his 52yo mother who was diagnosed with dementia - pt has become the primary caretaker for his mother and he reports to be his primary stressor. Pt reports attempting suicide "in the early 90s - I took 1 or 2 bottles of sleeping pills you can buy over the counter. I made myself throw-up and I went to the hospital [facility])". Pt reports having suicidal thoughts "a couple times a week". Pt has depressive sxs that have progressively gotten worse. Pt reported "I don't see no real reason to be here accept to take care of her (his mother).   Pt attempted to end his life by consuming an excessive amount of alcohol, this past week. Pt was admitted medically to [facility] 3 weeks ago due to increased blood sugar. Pt states he purposefully did not eat any food while drinking, reporting "I was hoping I would just drop dead, but my sister came by my mother's house and made me come the hospital". Pt reports he drinks 1/2 a gallon in 2 days - and drinks "almost everyday". Pt is not linked to an outpatient mental health provider. Pt denied any current medications for mood. Pt denied HI/AVH. Pt was pleasant in his demeanor while flat in his affect.  Diagnosis: Major Depressive Disorder  Past Medical History:  Past Medical History:  Diagnosis Date  . CAD (coronary artery disease)    a. 07/2013 NSTEMI/PCI: LCX 21m (4.0x23 Xience DES), RCA 49m, EF > 55%;  b. 07/2013 Echo: EF 60-65%, diast dysfxn, mild LVH, mildly dil LA, nl RV size/fxn, nl RVSP.  Marland Kitchen History of tobacco  abuse    a. quit ~ 2010  . Hyperlipidemia   . Morbid obesity ([place])    a. weighed 168 @ age 65.    Past Surgical History:  Procedure Laterality Date  . ADENOIDECTOMY    . CARDIAC CATHETERIZATION  07/2013   [facility]'x1 stent    Family History:  Family History  Problem Relation Age of Onset  . Heart disease Mother   . Esophageal cancer Father     Social History:  reports that he has quit smoking. His smoking use included cigarettes. He has a 10.00 pack-year smoking history. He has never used smokeless tobacco. He reports that he does not drink alcohol or use drugs.  Additional Social History:  Alcohol / Drug Use Pain Medications: See MAR Prescriptions: See MAR Over the Counter: See MAR History of alcohol / drug use?: Yes Longest period of sobriety (when/how long): "8 months" Negative Consequences of Use: Financial, Personal relationships, Work / School Withdrawal Symptoms: Agitation Substance #1 Name of Substance 1: Alcohol 1 - Age of First Use: 48yo 1 - Amount (size/oz): "not quite a fifth ... 1/2 gallon in 2 days" 1 - Frequency: "almost everyday" 1 - Duration: Years 1 - Last Use / Amount: 05/28/2018  CIWA: CIWA-Ar BP: (!) 94/59 Pulse Rate: 85 Nausea and Vomiting: no nausea and no vomiting Tactile Disturbances: none Tremor: no tremor Auditory Disturbances: not present Paroxysmal Sweats: barely  perceptible sweating, palms moist Visual Disturbances: not present Anxiety: no anxiety, at ease Headache, Fullness in Head: none present Agitation: normal activity Orientation and Clouding of Sensorium: oriented and can do serial additions CIWA-Ar Total: 1 COWS:    Allergies: No Known Allergies  Home Medications: (Not in a hospital admission)   OB/GYN Status:  No LMP for male patient.  General Assessment Data Location of Assessment: [facility] ED TTS Assessment: In system Is this a Tele or Face-to-Face Assessment?: Face-to-Face Is this an Initial Assessment or a Re-assessment  for this encounter?: Initial Assessment Patient Accompanied by:: N/A Language Other than English: No Living Arrangements: Other (Comment)(Private Resident) What gender do you identify as?: Male Marital status: Single Maiden name: n/a Pregnancy Status: No Living Arrangements: Parent(Mother (71yo)) Can pt return to current living arrangement?: Yes Admission Status: Involuntary Petitioner: ED Attending Is patient capable of signing voluntary admission?: No Referral Source: Self/Family/Friend Insurance type: [place] Medicaid  Medical Screening Exam ([place]) Medical Exam completed: Yes  Crisis Care Plan Living Arrangements: Parent(Mother (63yo)) Legal Guardian: Other:(Self) Name of Psychiatrist: None Reported Name of Therapist: None Reported  Education Status Is patient currently in school?: No Is the patient employed, unemployed or receiving disability?: Unemployed  Risk to self with the past 6 months Suicidal Ideation: Yes-Currently Present Has patient been a risk to self within the past 6 months prior to admission? : Yes Suicidal Intent: Yes-Currently Present Has patient had any suicidal intent within the past 6 months prior to admission? : Yes Is patient at risk for suicide?: Yes Suicidal Plan?: Yes-Currently Present Has patient had any suicidal plan within the past 6 months prior to admission? : Yes Specify Current Suicidal Plan: "shoot myself with a rifle" Access to Means: Yes Specify Access to Suicidal Means: Pt owns a gun that is located in his closet at home What has been your use of drugs/alcohol within the last 12 months?: Alcohol Previous Attempts/Gestures: Yes How many times?: 1((in the early 90s)) Other Self Harm Risks: None Triggers for Past Attempts: Family contact Intentional Self Injurious Behavior: None Family Suicide History: No Recent stressful life event(s): Conflict (Comment), Financial Problems, Recent negative physical changes(Conflict with  mother; financial strain) Persecutory voices/beliefs?: No Depression: Yes Depression Symptoms: Despondent, Insomnia, Tearfulness, Isolating, Fatigue, Guilt, Loss of interest in usual pleasures, Feeling worthless/self pity, Feeling angry/irritable Substance abuse history and/or treatment for substance abuse?: Yes Suicide prevention information given to non-admitted patients: Not applicable  Risk to Others within the past 6 months Homicidal Ideation: No Does patient have any lifetime risk of violence toward others beyond the six months prior to admission? : No Thoughts of Harm to Others: No Current Homicidal Intent: No Current Homicidal Plan: No Access to Homicidal Means: No Identified Victim: None History of harm to others?: No Assessment of Violence: None Noted Violent Behavior Description: N/A Does patient have access to weapons?: Yes (Comment)(Pt has access to a rifle gun) Criminal Charges Pending?: No Does patient have a court date: No Is patient on probation?: No  Psychosis Hallucinations: None noted Delusions: None noted  Mental Status Report Appearance/Hygiene: In scrubs Eye Contact: Fair Motor Activity: Freedom of movement Speech: Logical/coherent Level of Consciousness: Alert Mood: Depressed Affect: Flat Anxiety Level: Minimal Thought Processes: Coherent, Relevant Judgement: Unimpaired Orientation: Person, Place, Time, Situation, Appropriate for developmental age Obsessive Compulsive Thoughts/Behaviors: None  Cognitive Functioning Concentration: Normal Memory: Recent Intact, Remote Intact Is patient IDD: No Insight: Poor Impulse Control: Fair Appetite: Fair Have you had any weight changes? :  No Change Sleep: Decreased Total Hours of Sleep: 4 Vegetative Symptoms: None  ADLScreening [facility] Assessment Services) Patient's cognitive ability adequate to safely complete daily activities?: Yes Patient able to express need for assistance with ADLs?:  Yes Independently performs ADLs?: Yes (appropriate for developmental age)  Prior Inpatient Therapy Prior Inpatient Therapy: No  Prior Outpatient Therapy Prior Outpatient Therapy: No Does patient have an ACCT team?: No Does patient have Intensive In-House Services?  : No Does patient have [facility] services? : No Does patient have P4CC services?: No  ADL Screening (condition at time of admission) Patient's cognitive ability adequate to safely complete daily activities?: Yes Patient able to express need for assistance with ADLs?: Yes Independently performs ADLs?: Yes (appropriate for developmental age)       Abuse/Neglect Assessment (Assessment to be complete while patient is alone) Abuse/Neglect Assessment Can Be Completed: Yes Physical Abuse: Denies Verbal Abuse: Denies Sexual Abuse: Denies Exploitation of patient/patient's resources: Denies Self-Neglect: Denies Values / Beliefs Cultural Requests During Hospitalization: None Spiritual Requests During Hospitalization: None Consults Spiritual Care Consult Needed: No Social Work Consult Needed: No Regulatory affairs officer (For Healthcare) Does Patient Have a Medical Advance Directive?: No Would patient like information on creating a medical advance directive?: No - Patient declined Nutrition Screen- [facility] Adult/WL/AP Patient's home diet: Regular     Child/Adolescent Assessment Running Away Risk: (Patient is an adult)  Disposition:  Disposition Initial Assessment Completed for this Encounter: Yes Disposition of Patient: Admit Type of inpatient treatment program: Adult Patient refused recommended treatment: No Mode of transportation if patient is discharged/movement?: N/A Patient referred to: Other (Comment)([facility] BMU)  On Site Evaluation by:   Reviewed with Physician:    Frederich Cha 05/29/2018 2:43 PM

## 2018-05-29 NOTE — ED Notes (Signed)
Patient ate 100% of supper and beverage, no signs of distress, patient does have flat affect, q 15 minute checks.

## 2018-05-29 NOTE — ED Notes (Signed)
Patient transferred from quad to room 1 in the [facility], report was received from Porter

## 2018-05-29 NOTE — BH Assessment (Signed)
Patient is to be admitted to [facility] by Dr. Weber Cooks.  Attending Physician will be Dr. Bary Leriche.   Patient has been assigned to room 305-A, by [facility] Charge Nurse Demetria.   Intake Paper Work has been signed and placed on patient chart.  ER staff is aware of the admission:  Glenda, ER Secretary    Dr. Cherylann Banas, ER MD   Abigail Butts, Patient's Nurse   Butch Penny, Patient Access.

## 2018-05-29 NOTE — ED Triage Notes (Signed)
Patient ambulatory to triage with steady gait, without difficulty or distress noted; pt reports "its been a bad year"; st that he wants to talk to someone in behavioral; pt st having thoughts of hurting self (hx of same in the 90's)

## 2018-05-29 NOTE — Progress Notes (Signed)
New admit, who is involuntarily committed for heavy alcohol use, endorsing depression and anxiety, patient appear angry and frustrated with himself to the point of killing himself with a rifle to his chin and shoot , report having a gun in his closet, patient states that he is tired of taking care of his 50 year old mother who was diagnosed with dementia and reports to be his primary stressor.patient affect is blunt with depressed mood. Body search and skin checks are done by two nurses, no contraband found and skin has multiple skin tattoos.  Patient is alert  And oriented x 4, contract for safety of self and others, , unit guide line and expected behaviors are discussed , cold tray is offered but patient  declined it. Hygiene products provided , unit orientation complete , room is within eyesight for frequent monitoring from the nurses station.patient is reminded of 15 minutes safety rounding, no distress noted.

## 2018-05-29 NOTE — ED Notes (Signed)
IVC 

## 2018-05-29 NOTE — ED Notes (Signed)
EDT Zach to triage to change pt into behav scrubs

## 2018-05-29 NOTE — ED Provider Notes (Signed)
[facility] Emergency Department Provider Note  ____________________________________________   First MD Initiated Contact with Patient 05/29/18 [phone]     (approximate)  I have reviewed the triage vital signs and the nursing notes.   HISTORY  Chief Complaint Mental Health Problem    HPI Christian [name] is a 48 y.o. male with medical history as listed below who presents for evaluation of depression.  He states that his depression has been gradually worsening over the last 6 to 8 weeks although he is suffering from depression for a long period of time.  It is become severe.  It is compounded by an almost daily use of alcohol.  He states that most of the issues come from the fact that he cares for his mother and "she drives me crazy".  He says that when she starts yelling at him he gets upset he wants to kill himself and is consider going out into the yard and shooting himself in the head.  He does have a gun at home.  He says that he went about 24 hours without drinking and has had no tremor and he has never had any seizures or hallucinations when he stops drinking.  He did have some in the evening but relatively small amount.  He feels better than he did when he came in but he is concerned that if he goes home and his mother started yelling at him again that he is going to use "snap and just do it", meaning shooting himself.  He denies fever/chills, chest pain, shortness of breath, nausea, vomiting, and abdominal pain.  Nothing particular makes his symptoms better or worse and they are severe.        Past Medical History:  Diagnosis Date  . CAD (coronary artery disease)    a. 07/2013 NSTEMI/PCI: LCX 2m (4.0x23 Xience DES), RCA 61m, EF > 55%;  b. 07/2013 Echo: EF 60-65%, diast dysfxn, mild LVH, mildly dil LA, nl RV size/fxn, nl RVSP.  Marland Kitchen History of tobacco abuse    a. quit ~ 2010  . Hyperlipidemia   . Morbid obesity ([place])    a. weighed 168 @ age 7.    Patient  Active Problem List   Diagnosis Date Noted  . 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 Chest pain 12/30/2017  . CAD (coronary artery disease)   . HTN (hypertension)   . Hyperlipidemia   . Diabetes mellitus, type II ([place])   . Morbid obesity ([place])   . History of tobacco abuse     Past Surgical History:  Procedure Laterality Date  . ADENOIDECTOMY    . CARDIAC CATHETERIZATION  07/2013   [facility]'x1 stent    Prior to Admission medications   Medication Sig Start Date End Date Taking? Authorizing Provider  acetaminophen (TYLENOL) 500 MG tablet Take 1,000 mg by mouth every 6 (six) hours as needed.    [provider]  aspirin 81 MG chewable tablet Chew 1 tablet (81 mg total) by mouth daily. 05/11/18   Mayo, Pete Pelt, MD  carvedilol (COREG) 25 MG tablet Take 1 tablet (25 mg total) by mouth 2 (two) times daily. Patient not taking: Reported on 05/08/2018 03/27/18 03/27/19  Dustin Flock, MD  glipiZIDE (GLUCOTROL) 10 MG tablet Take 1 tablet (10 mg total) by mouth 2 (two) times daily. Patient not taking: Reported on 05/08/2018 03/27/18 03/27/19  Dustin Flock, MD  losartan (COZAAR) 100 MG tablet Take 1 tablet (100  mg total) by mouth daily. Patient not taking: Reported on 05/08/2018 03/27/18 03/27/19  Dustin Flock, MD  metFORMIN (GLUCOPHAGE) 1000 MG tablet Take 1 tablet (1,000 mg total) by mouth 2 (two) times daily with a meal. Patient not taking: Reported on 05/08/2018 03/27/18   Dustin Flock, MD  rosuvastatin (CRESTOR) 10 MG tablet Take 1 tablet (10 mg total) by mouth daily. Patient not taking: Reported on 05/08/2018 03/27/18   Dustin Flock, MD    Allergies Patient has no known allergies.  Family History  Problem Relation Age of Onset  . Heart disease Mother   . Esophageal cancer Father     Social History Social History   Tobacco Use  . Smoking status: Former Smoker    Packs/day: 0.50    Years: 20.00    Pack years: 10.00    Types:  Cigarettes  . Smokeless tobacco: Never Used  Substance Use Topics  . Alcohol use: No  . Drug use: No    Types: Marijuana    Comment: past    Review of Systems Constitutional: No fever/chills Eyes: No visual changes. ENT: No sore throat. Cardiovascular: Denies chest pain. Respiratory: Denies shortness of breath. Gastrointestinal: No abdominal pain.  No nausea, no vomiting.  No diarrhea.  No constipation. Genitourinary: Negative for dysuria. Musculoskeletal: Negative for neck pain.  Negative for back pain. Integumentary: Negative for rash. Neurological: Negative for headaches, focal weakness or numbness. Psychiatric:  Gradually worsening and now severe depression with suicidal ideation and access to firearms.  ____________________________________________   PHYSICAL EXAM:  VITAL SIGNS: ED Triage Vitals [05/29/18 0203]  Enc Vitals Group     BP (!) 183/111     Pulse Rate (!) 116     Resp 20     Temp 98.7 F (37.1 C)     Temp Source Oral     SpO2 100 %     Weight 82.6 kg (182 lb)     Height 1.676 m (5\' 6" )     Head Circumference      Peak Flow      Pain Score 0     Pain Loc      Pain Edu?      Excl. in [place]?     Constitutional: Alert and oriented. Well appearing and in no acute distress. Eyes: Conjunctivae are normal.  Head: Atraumatic. Nose: No congestion/rhinnorhea. Mouth/Throat: Mucous membranes are moist. Neck: No stridor.  No meningeal signs.   Cardiovascular: Normal rate, regular rhythm. Good peripheral circulation. Grossly normal heart sounds. Respiratory: Normal respiratory effort.  No retractions. Lungs CTAB. Gastrointestinal: Soft and nontender. No distention.  Musculoskeletal: No lower extremity tenderness nor edema. No gross deformities of extremities. Neurologic:  Normal speech and language. No gross focal neurologic deficits are appreciated.  Skin:  Skin is warm, dry and intact. No rash noted. Psychiatric: Mood and affect are calm and cooperative.  He  endorses intermittent suicidal ideation by firearm and has access to a gun at home.  Reports depression over an extended period of time.  Seems to have reasonably good insight and judgment into his situation.  ____________________________________________   LABS (all labs ordered are listed, but only abnormal results are displayed)  Labs Reviewed  COMPREHENSIVE METABOLIC PANEL - Abnormal; Notable for the following components:      Result Value   Sodium 133 (*)    Potassium 3.2 (*)    CO2 18 (*)    Glucose, Bld 292 (*)    Total Protein 8.2 (*)  Anion gap 17 (*)    All other components within normal limits  ETHANOL - Abnormal; Notable for the following components:   Alcohol, Ethyl (B) 46 (*)    All other components within normal limits  ACETAMINOPHEN LEVEL - Abnormal; Notable for the following components:   Acetaminophen (Tylenol), Serum <10 (*)    All other components within normal limits  CBC - Abnormal; Notable for the following components:   RBC 4.00 (*)    Hemoglobin 12.4 (*)    HCT 35.9 (*)    All other components within normal limits  GLUCOSE, CAPILLARY - Abnormal; Notable for the following components:   Glucose-Capillary 291 (*)    All other components within normal limits  SALICYLATE LEVEL  URINE DRUG SCREEN, QUALITATIVE ([facility] ONLY)  CBG MONITORING, ED   ____________________________________________  EKG  None - EKG not ordered by ED physician ____________________________________________  RADIOLOGY   ED MD interpretation: Notification for imaging  Official radiology report(s): No results found.  ____________________________________________   PROCEDURES   Procedure(s) performed (including Critical Care):  Procedures   ____________________________________________   INITIAL IMPRESSION / MDM / [place] / ED COURSE  As part of my medical decision making, I reviewed the following data within the [facility] notes  reviewed and incorporated, Labs reviewed , Old chart reviewed, A consult was requested from this/these consultant(s) Psychiatry, Notes from prior ED visits and [place] Controlled Substance Database         Differential diagnosis includes, but is not limited to, depression with suicidal ideation, substance-induced mood disorder, nonspecific mood disorder, adjustment disorder.  Sounds like the symptoms have been gradually building for an extended period of time and he is worried that he may "snap".  He has access to firearms admits to depression as well as to regular alcohol use.  There is no evidence that he is having alcohol withdrawal at this time and his lab work was all reassuring with only very slightly elevated ethanol level.  No evidence of any acute or emergent medical condition.  I am concerned about his safety and placed him under involuntary commitment.  He needs psychiatric evaluation and clearance for disposition plan.  I have ordered his regular home medications.     ____________________________________________  FINAL CLINICAL IMPRESSION(S) / ED DIAGNOSES  Final diagnoses:  Depression, unspecified depression type  Suicidal ideation  Alcohol abuse     MEDICATIONS GIVEN DURING THIS VISIT:  Medications  acetaminophen (TYLENOL) tablet 1,000 mg (has no administration in time range)  aspirin chewable tablet 81 mg (has no administration in time range)  carvedilol (COREG) tablet 25 mg (has no administration in time range)  glipiZIDE (GLUCOTROL) tablet 10 mg (has no administration in time range)  losartan (COZAAR) tablet 100 mg (has no administration in time range)  metFORMIN (GLUCOPHAGE) tablet 1,000 mg (has no administration in time range)  rosuvastatin (CRESTOR) tablet 10 mg (has no administration in time range)     ED Discharge Orders    None       Note:  This document was prepared using Dragon voice recognition software and may include unintentional dictation errors.     Hinda Kehr, MD 05/29/18 [phone]

## 2018-05-29 NOTE — ED Notes (Signed)
Dr. Weber Cooks evaluating patient, patient remains calm and cooperative.

## 2018-05-29 NOTE — Consult Note (Signed)
[place] [facility] Consult   Reason for Consult: Consult for this 48 year old man came voluntarily but now under IVC.  Complaints of major depression with psychosis Referring Physician: Siddiqui Patient Identification: Christian [name] MRN:  [id number] Principal Diagnosis: Severe recurrent major depression without psychotic features ([place]) Diagnosis:  Principal Problem:   Severe recurrent major depression without psychotic features ([place]) Active Problems:   HTN (hypertension)   Hyperlipidemia   Diabetes mellitus, type II ([place])   Suicidal ideation   Alcohol abuse   Total Time spent with patient: 1 hour  Subjective:   Christian [name] is a 48 y.o. male patient admitted with patient seen chart reviewed.  Discussed with TTS.  48 year old man who came to the hospital because of symptoms of depression.  Patient tells me that he has been feeling down and depressed for months now but it is getting worse.  He talks a lot about the stress that he feels living with his elderly mother.  He feels like she does not respect him and treats him badly.  Patient says his mood feels down and negative all the time.  He is having much more frequent thoughts of suicide and has recently thought about shooting himself.  Sleep is poor.  Appetite poor.  Not compliant with recommended medical treatment.  He denies any hallucinations.  He admits that he has been drinking "like a fish" saying that he drinks about three quarters of a standard bottle of liquor every day.  He says his last drink was Thursday evening but was a small amount.  Denies other drug use.  Not currently receiving any psychiatric or mental health treatment.  Marland Kitchen  HPI: See note above.  Depressed mood for months.  Gradually getting worse.  Down and negative all the time.  Bad thoughts about himself.  Lack of enjoyment of activities.  Poor sleep.  Poor appetite.  Suicidal thoughts with thoughts of shooting himself.  No psychotic symptoms.  Drinking alcohol  regularly.  Not taking his medicine for his medical problems and not getting any psychiatric treatment.  No homicidal ideation.  Major stresses are living with him taking care of his elderly mother and not working.  Medical history: High blood pressure type 2 diabetes elevated lipids and recently apparently a diagnosis of scoliosis causing him to have chronic back and chest pain.  Social history: Divorced a long time ago.  Not currently working.  Living with his elderly mother.  Substance abuse history: States that he drinks heavily all the time.  He had been sober until he started back up in January.  No history of delirium tremens or seizures.  No history of substance abuse treatment.  Denies other drug use.  Alcohol level on admission was positive but below the legal limit.  Past Psychiatric History: Patient reports that when he was getting divorced in the early 35s he took an overdose with suicidal intent.  He was hospitalized.  He presumes he was on medicine but cannot remember anything about it.  Since then has really not had any further psychiatric treatment.  He does have a history of suicide attempts in the past.  No history of mania and no history of psychosis.  Risk to Self:   Risk to Others:   Prior Inpatient Therapy:   Prior Outpatient Therapy:    Past Medical History:  Past Medical History:  Diagnosis Date  . CAD (coronary artery disease)    a. 07/2013 NSTEMI/PCI: LCX 67m (4.0x23 Xience DES), RCA 78m,  EF > 55%;  b. 07/2013 Echo: EF 60-65%, diast dysfxn, mild LVH, mildly dil LA, nl RV size/fxn, nl RVSP.  Marland Kitchen History of tobacco abuse    a. quit ~ 2010  . Hyperlipidemia   . Morbid obesity ([place])    a. weighed 168 @ age 14.    Past Surgical History:  Procedure Laterality Date  . ADENOIDECTOMY    . CARDIAC CATHETERIZATION  07/2013   [facility]'x1 stent   Family History:  Family History  Problem Relation Age of Onset  . Heart disease Mother   . Esophageal cancer Father    Family  Psychiatric  History: Says that his mother has recurrent depression and has had suicide attempts and hospitalizations. Social History:  Social History   Substance and Sexual Activity  Alcohol Use No     Social History   Substance and Sexual Activity  Drug Use No  . Types: Marijuana   Comment: past    Social History   Socioeconomic History  . Marital status: Single    Spouse name: Not on file  . Number of children: Not on file  . Years of education: Not on file  . Highest education level: Not on file  Occupational History  . Not on file  Social Needs  . Financial resource strain: Not on file  . Food insecurity:    Worry: Not on file    Inability: Not on file  . Transportation needs:    Medical: Not on file    Non-medical: Not on file  Tobacco Use  . Smoking status: Former Smoker    Packs/day: 0.50    Years: 20.00    Pack years: 10.00    Types: Cigarettes  . Smokeless tobacco: Never Used  Substance and Sexual Activity  . Alcohol use: No  . Drug use: No    Types: Marijuana    Comment: past  . Sexual activity: Not on file  Lifestyle  . Physical activity:    Days per week: Not on file    Minutes per session: Not on file  . Stress: Not on file  Relationships  . Social connections:    Talks on phone: Not on file    Gets together: Not on file    Attends religious service: Not on file    Active member of club or organization: Not on file    Attends meetings of clubs or organizations: Not on file    Relationship status: Not on file  Other Topics Concern  . Not on file  Social History Narrative  . Not on file   Additional Social History:    Allergies:  No Known Allergies  Labs:  Results for orders placed or performed during the hospital encounter of 05/29/18 (from the past 48 hour(s))  Glucose, capillary     Status: Abnormal   Collection Time: 05/29/18  2:06 AM  Result Value Ref Range   Glucose-Capillary 291 (H) 70 - 99 mg/dL  Comprehensive metabolic  panel     Status: Abnormal   Collection Time: 05/29/18  2:08 AM  Result Value Ref Range   Sodium 133 (L) 135 - 145 mmol/L   Potassium 3.2 (L) 3.5 - 5.1 mmol/L   Chloride 98 98 - 111 mmol/L   CO2 18 (L) 22 - 32 mmol/L   Glucose, Bld 292 (H) 70 - 99 mg/dL   BUN 6 6 - 20 mg/dL   Creatinine, Ser 0.70 0.61 - 1.24 mg/dL   Calcium 8.9 8.9 - 10.3  mg/dL   Total Protein 8.2 (H) 6.5 - 8.1 g/dL   Albumin 4.7 3.5 - 5.0 g/dL   AST 21 15 - 41 U/L   ALT 25 0 - 44 U/L   Alkaline Phosphatase 91 38 - 126 U/L   Total Bilirubin 1.0 0.3 - 1.2 mg/dL   GFR calc non Af Amer >60 >60 mL/min   GFR calc Af Amer >60 >60 mL/min   Anion gap 17 (H) 5 - 15    Comment: Performed at [facility], [address] [place], [place] [address]  Ethanol     Status: Abnormal   Collection Time: 05/29/18  2:08 AM  Result Value Ref Range   Alcohol, Ethyl (B) 46 (H) <10 mg/dL    Comment: (NOTE) Lowest detectable limit for serum alcohol is 10 mg/dL. For medical purposes only. Performed at [facility], [address] [place], [place] [address]   Salicylate level     Status: None   Collection Time: 05/29/18  2:08 AM  Result Value Ref Range   Salicylate Lvl <0.5 2.8 - 30.0 mg/dL    Comment: Performed at [facility], [address] [place], [place] [address]  Acetaminophen level     Status: Abnormal   Collection Time: 05/29/18  2:08 AM  Result Value Ref Range   Acetaminophen (Tylenol), Serum <10 (L) 10 - 30 ug/mL    Comment: (NOTE) Therapeutic concentrations vary significantly. A range of 10-30 ug/mL  may be an effective concentration for many patients. However, some  are best treated at concentrations outside of this range. Acetaminophen concentrations >150 ug/mL at 4 hours after ingestion  and >50 ug/mL at 12 hours after ingestion are often associated with  toxic reactions. Performed at [facility], [address] [place], [place] [address]   cbc     Status: Abnormal    Collection Time: 05/29/18  2:08 AM  Result Value Ref Range   WBC 5.6 4.0 - 10.5 K/uL   RBC 4.00 (L) 4.22 - 5.81 MIL/uL   Hemoglobin 12.4 (L) 13.0 - 17.0 g/dL   HCT 35.9 (L) 39.0 - 52.0 %   MCV 89.8 80.0 - 100.0 fL   MCH 31.0 26.0 - 34.0 pg   MCHC 34.5 30.0 - 36.0 g/dL   RDW 14.4 11.5 - 15.5 %   Platelets 215 150 - 400 K/uL   nRBC 0.0 0.0 - 0.2 %    Comment: Performed at [facility], [address], [place], [place] [address]  Urine Drug Screen, Qualitative     Status: None   Collection Time: 05/29/18  3:49 AM  Result Value Ref Range   Tricyclic, Ur Screen NONE DETECTED NONE DETECTED   Amphetamines, Ur Screen NONE DETECTED NONE DETECTED   MDMA (Ecstasy)Ur Screen NONE DETECTED NONE DETECTED   Cocaine Metabolite,Ur [place] NONE DETECTED NONE DETECTED   Opiate, Ur Screen NONE DETECTED NONE DETECTED   Phencyclidine (PCP) Ur S NONE DETECTED NONE DETECTED   Cannabinoid 50 Ng, Ur [place] NONE DETECTED NONE DETECTED   Barbiturates, Ur Screen NONE DETECTED NONE DETECTED   Benzodiazepine, Ur Scrn NONE DETECTED NONE DETECTED   Methadone Scn, Ur NONE DETECTED NONE DETECTED    Comment: (NOTE) Tricyclics + metabolites, urine    Cutoff 1000 ng/mL Amphetamines + metabolites, urine  Cutoff 1000 ng/mL MDMA (Ecstasy), urine              Cutoff 500 ng/mL Cocaine Metabolite, urine  Cutoff 300 ng/mL Opiate + metabolites, urine        Cutoff 300 ng/mL Phencyclidine (PCP), urine         Cutoff 25 ng/mL Cannabinoid, urine                 Cutoff 50 ng/mL Barbiturates + metabolites, urine  Cutoff 200 ng/mL Benzodiazepine, urine              Cutoff 200 ng/mL Methadone, urine                   Cutoff 300 ng/mL The urine drug screen provides only a preliminary, unconfirmed analytical test result and should not be used for non-medical purposes. Clinical consideration and professional judgment should be applied to any positive drug screen result due to possible interfering substances. A more  specific alternate chemical method must be used in order to obtain a confirmed analytical result. Gas chromatography / mass spectrometry (GC/MS) is the preferred confirmat ory method. Performed at [facility], [address] [place], [place] [address]   Glucose, capillary     Status: Abnormal   Collection Time: 05/29/18  1:28 PM  Result Value Ref Range   Glucose-Capillary 358 (H) 70 - 99 mg/dL    Current Facility-Administered Medications  Medication Dose Route Frequency Provider Last Rate Last Dose  . acetaminophen (TYLENOL) tablet 1,000 mg  1,000 mg Oral Q6H PRN Hinda Kehr, MD      . aspirin chewable tablet 81 mg  81 mg Oral Daily Hinda Kehr, MD   81 mg at 05/29/18 1044  . carvedilol (COREG) tablet 25 mg  25 mg Oral BID Hinda Kehr, MD   25 mg at 05/29/18 1044  . FLUoxetine (PROZAC) capsule 20 mg  20 mg Oral Daily ,  T, MD      . glipiZIDE (GLUCOTROL) tablet 10 mg  10 mg Oral BID Hinda Kehr, MD   10 mg at 05/29/18 1044  . ibuprofen (ADVIL,MOTRIN) tablet 600 mg  600 mg Oral Q6H PRN ,  T, MD      . insulin aspart (novoLOG) injection 0-9 Units  0-9 Units Subcutaneous TID WC ,  T, MD      . losartan (COZAAR) tablet 100 mg  100 mg Oral Daily Hinda Kehr, MD   100 mg at 05/29/18 1044  . metFORMIN (GLUCOPHAGE) tablet 1,000 mg  1,000 mg Oral BID WC Hinda Kehr, MD   1,000 mg at 05/29/18 1044  . rosuvastatin (CRESTOR) tablet 10 mg  10 mg Oral Daily Hinda Kehr, MD   10 mg at 05/29/18 1044   Current Outpatient Medications  Medication Sig Dispense Refill  . acetaminophen (TYLENOL) 500 MG tablet Take 1,000 mg by mouth every 6 (six) hours as needed.    Marland Kitchen aspirin 81 MG chewable tablet Chew 1 tablet (81 mg total) by mouth daily. 30 tablet 0  . carvedilol (COREG) 25 MG tablet Take 1 tablet (25 mg total) by mouth 2 (two) times daily. (Patient not taking: Reported on 05/08/2018) 60 tablet 11  . glipiZIDE (GLUCOTROL) 10 MG tablet Take 1  tablet (10 mg total) by mouth 2 (two) times daily. (Patient not taking: Reported on 05/08/2018) 60 tablet 11  . losartan (COZAAR) 100 MG tablet Take 1 tablet (100 mg total) by mouth daily. (Patient not taking: Reported on 05/08/2018) 30 tablet 11  . metFORMIN (GLUCOPHAGE) 1000 MG tablet Take 1 tablet (1,000 mg total) by mouth 2 (two) times daily with a meal. (Patient  not taking: Reported on 05/08/2018) 60 tablet 0  . rosuvastatin (CRESTOR) 10 MG tablet Take 1 tablet (10 mg total) by mouth daily. (Patient not taking: Reported on 05/08/2018) 30 tablet 0    Musculoskeletal: Strength & Muscle Tone: within normal limits Gait & Station: normal Patient leans: N/A  Psychiatric Specialty Exam: Physical Exam  Nursing note and vitals reviewed. Constitutional: He appears well-developed and well-nourished.  HENT:  Head: Normocephalic and atraumatic.  Eyes: Pupils are equal, round, and reactive to light. Conjunctivae are normal.  Neck: Normal range of motion.  Cardiovascular: Regular rhythm and normal heart sounds.  Respiratory: Effort normal. No respiratory distress.  GI: Soft.  Musculoskeletal: Normal range of motion.  Neurological: He is alert.  Skin: Skin is warm and dry.  Psychiatric: Judgment normal. His affect is blunt. His speech is delayed. He is slowed. Cognition and memory are normal. He exhibits a depressed mood. He expresses suicidal ideation. He expresses suicidal plans.    Review of Systems  Constitutional: Negative.   HENT: Negative.   Eyes: Negative.   Respiratory: Negative.   Cardiovascular: Negative.   Gastrointestinal: Negative.   Musculoskeletal: Negative.   Skin: Negative.   Neurological: Negative.   Psychiatric/Behavioral: Positive for depression, memory loss, substance abuse and suicidal ideas. Negative for hallucinations. The patient is nervous/anxious and has insomnia.     Blood pressure (!) 94/59, pulse 85, temperature 98.4 F (36.9 C), temperature source Oral, resp.  rate 16, height 5\' 6"  (1.676 m), weight 82.6 kg, SpO2 98 %.Body mass index is 29.38 kg/m.  General Appearance: Casual  Eye Contact:  Minimal  Speech:  Slow  Volume:  Decreased  Mood:  Depressed  Affect:  Depressed  Thought Process:  Goal Directed  Orientation:  Full (Time, Place, and Person)  Thought Content:  Logical  Suicidal Thoughts:  Yes.  with intent/plan  Homicidal Thoughts:  No  Memory:  Immediate;   Fair Recent;   Fair Remote;   Fair  Judgement:  Fair  Insight:  Fair  Psychomotor Activity:  Decreased  Concentration:  Concentration: Fair  Recall:  [employer] of Knowledge:  Fair  Language:  Fair  Akathisia:  No  Handed:  Right  AIMS (if indicated):     Assets:  Communication Skills Desire for Improvement Housing Resilience Social Support  ADL's:  Intact  Cognition:  WNL  Sleep:        Treatment Plan Summary: Daily contact with patient to assess and evaluate symptoms and progress in treatment, Medication management and Plan Patient meets commitment criteria and requires inpatient hospitalization for suicidal ideation.  He is restarted on his appropriate outpatient medicines for diabetes high blood pressure and cholesterol.  Additionally I will provide sliding scale coverage until we are sure what his sugars are supposed to be.  Patient is not showing any tremulousness or sign of alcohol withdrawal.  We will not start detox orders at this point but will keep in mind that it is possible that he could develop symptoms.  Patient will be on 15-minute checks and will be engaged in groups and activities on the unit.  I am putting in an order to start Prozac as an initial treatment for depression.  Patient agreeable to plan.  Disposition: Recommend psychiatric Inpatient admission when medically cleared. Supportive therapy provided about ongoing stressors.  Alethia Berthold, MD 05/29/2018 1:30 PM

## 2018-05-29 NOTE — ED Notes (Signed)
Patient observed lying in bed with eyes closed  Even, unlabored respirations observed   NAD pt appears to be sleeping  I will continue to monitor along with every 15 minute visual observations and ongoing security monitoring    

## 2018-05-29 NOTE — ED Notes (Signed)
Patient is alert and oriented and denies pain. Patient has been pleasant and cooperative he is taking his medications as prescribed. Patient denies SI/HI and A/V hallucinations at this time. He has a flat and depressed affect. Patient provided support and encouragement. Q 15 minute checks in progress and patient remains safe on unit. Monitoring continues.

## 2018-05-29 NOTE — ED Notes (Signed)
IVC/ Consult completed/ Plan to Admit to BMU

## 2018-05-29 NOTE — Progress Notes (Signed)
Inpatient Diabetes Program Recommendations  AACE/ADA: New Consensus Statement on Inpatient Glycemic Control (2015)  Target Ranges:  Prepandial:   less than 140 mg/dL      Peak postprandial:   less than 180 mg/dL (1-2 hours)      Critically ill patients:  140 - 180 mg/dL   Results for MONTAGUE, CORELLA (MRN [id number]) as of 05/29/2018 07:43  Ref. Range 05/29/2018 02:06  Glucose-Capillary Latest Ref Range: 70 - 99 mg/dL 291 (H)    Admit with: Depression/ Suicidal Ideation/ ETOH Abuse  History: DM  Home DM Meds: Glipizide 10 mg BID (NOT taking)       Metformin 1000 mg BID (NOT taking)  Current Orders: Glipizide 10 mg BID      Metformin 1000 mg BID       MD- Please also consider adding orders for Novolog SSI while patient waiting for placement:  Novolog Sensitive Correction Scale/ SSI (0-9 units) TID AC + HS   (Use Glycemic Control Order set)     --Will follow patient during hospitalization--  Wyn Quaker RN, MSN, CDE Diabetes Coordinator Inpatient Glycemic Control Team Team Pager: [phone] (8a-5p)

## 2018-05-29 NOTE — ED Notes (Signed)
Pt. States he currently lives with mother who suffers from dementia.  Pt. States this arrangement has become more difficult and pt. states financial troubles.  Pt. States he has been contemplating suicide, when asked how he would do this patient states he has access to firearms at house.  Pt. Is calm and cooperative at this time.

## 2018-05-29 NOTE — ED Notes (Signed)
Pt given meal tray and ginger ale 

## 2018-05-29 NOTE — ED Notes (Signed)
Patient alert, oriented and ambulatory. Report called to [facility]. Patient aware of admission to BMU and has no questions at this time. All patient belongings sent to BMU with patient. Patient denies  Pain. Patient escorted to [facility] by police security and staff.

## 2018-05-29 NOTE — ED Notes (Signed)
BEHAVIORAL HEALTH ROUNDING Patient sleeping: Yes.   Patient alert and oriented: eyes closed  Appears asleep Behavior appropriate: Yes.  ; If no, describe:  Nutrition and fluids offered: Yes  Toileting and hygiene offered: sleeping Sitter present: q 15 minute observations and security monitoring Law enforcement present: yes  ODS 

## 2018-05-29 NOTE — ED Notes (Signed)

## 2018-05-29 NOTE — ED Notes (Signed)
ED  Is the patient under IVC or is there intent for IVC: Yes.   Is the patient medically cleared: Yes.   Is there vacancy in the ED BHU: Yes.   Is the population mix appropriate for patient: Yes.   Is the patient awaiting placement in inpatient or outpatient setting:  Has the patient had a psychiatric consult:  Consult pending  Survey of unit performed for contraband, proper placement and condition of furniture, tampering with fixtures in bathroom, shower, and each patient room: Yes.  ; Findings:  APPEARANCE/BEHAVIOR Calm and cooperative NEURO ASSESSMENT Orientation: oriented x3  Denies pain Hallucinations: No.None noted (Hallucinations) denies  Speech: Normal Gait: normal RESPIRATORY ASSESSMENT Even  Unlabored respirations  CARDIOVASCULAR ASSESSMENT Pulses equal   regular rate  Skin warm and dry   GASTROINTESTINAL ASSESSMENT no GI complaint EXTREMITIES Full ROM  PLAN OF CARE Provide calm/safe environment. Vital signs assessed twice daily. ED BHU Assessment once each 12-hour shift. Collaborate with TTS daily or as condition indicates. Assure the ED provider has rounded once each shift. Provide and encourage hygiene. Provide redirection as needed. Assess for escalating behavior; address immediately and inform ED provider.  Assess family dynamic and appropriateness for visitation as needed: Yes.  ; If necessary, describe findings:  Educate the patient/family about BHU procedures/visitation: Yes.  ; If necessary, describe findings:

## 2018-05-29 NOTE — ED Notes (Signed)
Nurse talked with Patient and He talked about His depression and how He lost it yesterday and wanted to kill himself in front of His mom, states that she was hard to live with and always starting arguments, and how He had spent all of her money and she does not know it yet, and He knows that it is going to be hell when she finds out, He states He is trying to get a job, has put in many applications, thinks He will feel better if He could just work and get out of the house some, patient with Si thoughts without a plan, states that He has no Hi or avh, only wants to harm himself, Patient contracts for safety at this time, q 15 minute checks, and camera surveillance for safety.

## 2018-05-30 ENCOUNTER — Other Ambulatory Visit: Payer: Self-pay

## 2018-05-30 DIAGNOSIS — F332 Major depressive disorder, recurrent severe without psychotic features: Principal | ICD-10-CM

## 2018-05-30 LAB — HEMOGLOBIN A1C
Hgb A1c MFr Bld: 9.6 % — ABNORMAL HIGH (ref 4.8–5.6)
Hgb A1c MFr Bld: 9.2 % — ABNORMAL HIGH (ref 4.8–5.6)
Mean Plasma Glucose: 228.82 mg/dL
Mean Plasma Glucose: 217.34 mg/dL

## 2018-05-30 LAB — LIPID PANEL
Cholesterol: 274 mg/dL — ABNORMAL HIGH (ref 0–200)
HDL: 37 mg/dL — ABNORMAL LOW (ref >40)
LDL Cholesterol: UNDETERMINED mg/dL (ref 0–99)
Total CHOL/HDL Ratio: 7.4 RATIO
Triglycerides: 665 mg/dL — ABNORMAL HIGH (ref <150)
VLDL: UNDETERMINED mg/dL (ref 0–40)

## 2018-05-30 LAB — GLUCOSE, CAPILLARY
Glucose-Capillary: 323 mg/dL — ABNORMAL HIGH (ref 70–99)
Glucose-Capillary: 288 mg/dL — ABNORMAL HIGH (ref 70–99)
Glucose-Capillary: 297 mg/dL — ABNORMAL HIGH (ref 70–99)
Glucose-Capillary: 266 mg/dL — ABNORMAL HIGH (ref 70–99)

## 2018-05-30 LAB — TSH: TSH: 4.245 u[IU]/mL (ref 0.350–4.500)

## 2018-05-30 MED ORDER — INSULIN ASPART 100 UNIT/ML ~~LOC~~ SOLN
0.0000 [IU] | Freq: Every day | SUBCUTANEOUS | Status: DC
  Administered 2018-05-30: 3 [IU] via SUBCUTANEOUS
  Administered 2018-05-31 – 2018-06-01 (×2): 2 [IU] via SUBCUTANEOUS
  Filled 2018-05-30 (×2): qty 1

## 2018-05-30 NOTE — Plan of Care (Signed)
Patient is alert and oriented x 4. Patient present in the milieu for meals and medications and is observed interacting selectively with his peers. Reports that he slept poor last night without the use of an sleep aid. Patient educated on the sleep medications that are offered on the unit prn. Rates her depression a 7, hopelessness 8 and anxiety a 5. Endorses  passive SI, verbally contracts for safety. Milieu remains safe with q 15 minute safety checks. Will continue to monitor.

## 2018-05-30 NOTE — Progress Notes (Signed)
D: Patient reports some intermittent SI but denies plan or intent and contracts for safety on the unit. Mood is sad. Affect is flat. Pt complained of some nausea and reportedly vomited at shift change but no vomit was visualized. Pt was offered medication for nausea and declined. He said he thought it was something he ate and reported having diarrhea for the past couple of days. CBG at HS was 266.  A: Continue to monitor for safety and offer support. R: Safety maintained

## 2018-05-30 NOTE — Tx Team (Signed)
Initial Treatment Plan 05/30/2018 12:43 AM Christian [name] COB:[id number]    PATIENT STRESSORS: Financial difficulties Health problems Marital or family conflict Occupational concerns   PATIENT STRENGTHS: Capable of independent living Supportive family/friends   PATIENT IDENTIFIED PROBLEMS:   Suicide Ideations    Depressions/ Anxiety    Financial issues.           DISCHARGE CRITERIA:  Adequate post-discharge living arrangements Need for constant or close observation no longer present Verbal commitment to aftercare and medication compliance  PRELIMINARY DISCHARGE PLAN: Attend 12-step recovery group Outpatient therapy Participate in family therapy Return to previous living arrangement  PATIENT/FAMILY INVOLVEMENT: This treatment plan has been presented to and reviewed with the patient, Christian [name],   The patient  have been given the opportunity to ask questions and make suggestions.  Clemens Catholic, RN 05/30/2018, 12:43 AM
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
## 2018-05-30 NOTE — BHH Group Notes (Signed)
LCSW Group Therapy Note  05/30/2018 1:15pm  Type of Therapy and Topic:  Group Therapy:  Cognitive Distortions  Participation Level:  Did Not Attend   Description of Group:    Patients in this group will be introduced to the topic of cognitive distortions.  Patients will identify and describe cognitive distortions, describe the feelings these distortions create for them.  Patients will identify one or more situations in their personal life where they have cognitively distorted thinking and will verbalize challenging this cognitive distortion through positive thinking skills.  Patients will practice the skill of using positive affirmations to challenge cognitive distortions using affirmation cards.    Therapeutic Goals:  1. Patient will identify two or more cognitive distortions they have used 2. Patient will identify one or more emotions that stem from use of a cognitive distortion 3. Patient will demonstrate use of a positive affirmation to counter a cognitive distortion through discussion and/or role play. 4. Patient will describe one way cognitive distortions can be detrimental to wellness   Summary of Patient Progress: Pt was invited to attend group but chose not to attend. CSW will continue to encourage pt to attend group throughout their admission.      Therapeutic Modalities:   Cognitive Behavioral Therapy Motivational Interviewing   Deshannon Seide  CUEBAS-COLON, LCSW 05/30/2018 12:02 PM

## 2018-05-30 NOTE — BHH Suicide Risk Assessment (Signed)
[facility] Admission Suicide Risk Assessment   Nursing information obtained from:  Patient Demographic factors:  Low socioeconomic status Current Mental Status:  Suicidal ideation indicated by patient Loss Factors:  Decline in physical health Historical Factors:  Prior suicide attempts Risk Reduction Factors:  Positive therapeutic relationship  Total Time spent with patient: 1 hour Principal Problem: Severe recurrent major depression without psychotic features (Christian [name]) Diagnosis:  Principal Problem:   Severe recurrent major depression without psychotic features (Christian [name]) Active Problems:   Diabetes mellitus, type II (Christian [name])   Suicidal ideation  Subjective Data: suicidal ideation with a plan to shoot himself  Continued Clinical Symptoms:  Alcohol Use Disorder Identification Test Final Score (AUDIT): 13 The "Alcohol Use Disorders Identification Test", Guidelines for Use in Primary Care, Second Edition.  World Pharmacologist [facility]). Score between 0-7:  no or low risk or alcohol related problems. Score between 8-15:  moderate risk of alcohol related problems. Score between 16-19:  high risk of alcohol related problems. Score 20 or above:  warrants further diagnostic evaluation for alcohol dependence and treatment.   CLINICAL FACTORS:   Depression:   Comorbid alcohol abuse/dependence Impulsivity Insomnia Alcohol/Substance Abuse/Dependencies   Musculoskeletal: Strength & Muscle Tone: within normal limits Gait & Station: normal Patient leans: N/A  Psychiatric Specialty Exam: Physical Exam  Nursing note and vitals reviewed. Psychiatric: His speech is normal and behavior is normal. His affect is blunt. Cognition and memory are normal. He exhibits a depressed mood. He expresses suicidal ideation. He expresses suicidal plans.    Review of Systems  Neurological: Negative.   Psychiatric/Behavioral: Positive for depression, substance abuse and suicidal ideas.  All other systems reviewed and  are negative.   Blood pressure 111/79, pulse 92, temperature 98.9 F (37.2 C), temperature source Oral, resp. rate 18, height 5\' 6"  (1.676 m), weight 78.5 kg, SpO2 99 %.Body mass index is 27.92 kg/m.  General Appearance: Casual  Eye Contact:  Good  Speech:  Clear and Coherent  Volume:  Normal  Mood:  Depressed, Hopeless and Worthless  Affect:  Depressed, Flat and Tearful  Thought Process:  Goal Directed and Descriptions of Associations: Intact  Orientation:  Full (Time, Place, and Person)  Thought Content:  WDL  Suicidal Thoughts:  Yes.  with intent/plan  Homicidal Thoughts:  No  Memory:  Immediate;   Fair Recent;   Fair Remote;   Fair  Judgement:  Poor  Insight:  Shallow  Psychomotor Activity:  Decreased  Concentration:  Concentration: Poor and Attention Span: Poor  Recall:  Poor  Fund of Knowledge:  Poor  Language:  Fair  Akathisia:  No  Handed:  Right  AIMS (if indicated):     Assets:  Communication Skills Desire for Improvement Housing Resilience Social Support  ADL's:  Intact  Cognition:  WNL  Sleep:         COGNITIVE FEATURES THAT CONTRIBUTE TO RISK:  None    SUICIDE RISK:   Moderate:  Frequent suicidal ideation with limited intensity, and duration, some specificity in terms of plans, no associated intent, good self-control, limited dysphoria/symptomatology, some risk factors present, and identifiable protective factors, including available and accessible social support.  PLAN OF CARE: hospital admission, medication management, subsance abuse counseling, discharge planning.  Christian [name] is a 48 year old mal with a history of alcoholism admitted for suicidal ideation in the context of social stressors.  #Suicidal ideation -patient able to contract for safety  #Mood -Prozac 20 mg daily -Trazodone 100 mg nightly  #Alcohhol abuse -denies symptoms of  withdrawal -VS are stable  #DM -ADA diet -Metformin 1000 mg BID -Glucotrol 10 mg BID -SSI -input from  [facility] ppreciated  #HTN 100 mg daily -Coreg 25 mg daily -Asa 81 mg daily -Cozaar 100 mg daily  #Dyslipidemia -lipids quite elevated, possibly from drinking -recheck in few days -Crestor 10 mg daily  #Disposition -discharge to home -follow up with [facility]    I certify that inpatient services furnished can reasonably be expected to improve the patient's condition.   Orson Slick, MD 05/30/2018, 6:39 AM

## 2018-05-30 NOTE — Plan of Care (Signed)
D: Patient reports some intermittent SI but denies plan or intent and contracts for safety on the unit. Mood is sad. Affect is flat. Pt complained of some nausea and reportedly vomited at shift change but no vomit was visualized. Pt was offered medication for nausea and declined. He said he thought it was something he ate and reported having diarrhea for the past couple of days. CBG at HS was 266.  A: Continue to monitor for safety and offer support. R: Safety maintained

## 2018-05-31 LAB — GLUCOSE, CAPILLARY
GLUCOSE-CAPILLARY: 224 mg/dL — AB (ref 70–99)
Glucose-Capillary: 266 mg/dL — ABNORMAL HIGH (ref 70–99)
Glucose-Capillary: 293 mg/dL — ABNORMAL HIGH (ref 70–99)
Glucose-Capillary: 269 mg/dL — ABNORMAL HIGH (ref 70–99)

## 2018-05-31 NOTE — BHH Group Notes (Signed)
LCSW Group Therapy Note 05/31/2018 1:15pm  Type of Therapy and Topic: Group Therapy: Feelings Around Returning Home & Establishing a Supportive Framework and Supporting Oneself When Supports Not Available  Participation Level: Active  Description of Group:  Patients first processed thoughts and feelings about upcoming discharge. These included fears of upcoming changes, lack of change, new living environments, judgements and expectations from others and overall stigma of mental health issues. The group then discussed the definition of a supportive framework, what that looks and feels like, and how do to discern it from an unhealthy non-supportive network. The group identified different types of supports as well as what to do when your family/friends are less than helpful or unavailable  Therapeutic Goals  1. Patient will identify one healthy supportive network that they can use at discharge. 2. Patient will identify one factor of a supportive framework and how to tell it from an unhealthy network. 3. Patient able to identify one coping skill to use when they do not have positive supports from others. 4. Patient will demonstrate ability to communicate their needs through discussion and/or role plays.  Summary of Patient Progress:  The patient reported he feels "tired." Pt engaged during group session. As patients processed their anxiety about discharge and described healthy supports patient shared he is not ready to be discharge.  Patients identified at least one self-care tool they were willing to use after discharge.   Therapeutic Modalities Cognitive Behavioral Therapy Motivational Interviewing   Christian [name]  CUEBAS-COLON, LCSW 05/31/2018 9:42 AM

## 2018-05-31 NOTE — Plan of Care (Signed)
D: Patient denies SI today.Contracts for safety. Denies HI and AV hallucinations. Mood is sad. Affect flat. Voices no complaints. Interacting appropriately with staff and peers. A: Continue to monitor for safety and offer support. R. Safety maintained

## 2018-05-31 NOTE — BHH Counselor (Signed)
Adult Comprehensive Assessment  Patient ID: Christian [name], male   DOB: 02/01/1971, 48 y.o.   MRN: [id number]  Information Source: Information source: Patient  Current Stressors:  Patient states their primary concerns and needs for treatment are:: "I was going to kill myself" Patient states their goals for this hospitilization and ongoing recovery are:: "get table" Educational / Learning stressors: none reported Employment / Job issues: none reported Family Relationships: "Counselling psychologist / Lack of resources (include bankruptcy): unemployed Housing / Lack of housing: resides with mother Physical health (include injuries & life threatening diseases): scoliosis Social relationships: " I dont have any friends" Substance abuse: ETOH  Living/Environment/Situation:  Living Arrangements: Parent Who else lives in the home?: MOM How long has patient lived in current situation?: 6 years What is atmosphere in current home: Temporary  Family History:  Marital status: Divorced Divorced, when?: 24 years ago Are you sexually active?: No What is your sexual orientation?: heterosexual Has your sexual activity been affected by drugs, alcohol, medication, or emotional stress?: no Does patient have children?: Yes How many children?: 2 How is patient's relationship with their children?: pt reports that he does not talk to his children  Childhood History:  By whom was/is the patient raised?: Both parents Description of patient's relationship with caregiver when they were a child: good Patient's description of current relationship with people who raised him/her: good How were you disciplined when you got in trouble as a child/adolescent?: "there wasnt any" Does patient have siblings?: Yes Number of Siblings: 2 Description of patient's current relationship with siblings: "good" Did patient suffer any verbal/emotional/physical/sexual abuse as a child?: No Did patient suffer from severe childhood  neglect?: No Has patient ever been sexually abused/assaulted/raped as an adolescent or adult?: No Was the patient ever a victim of a crime or a disaster?: No Witnessed domestic violence?: No Has patient been effected by domestic violence as an adult?: No  Education:  Highest grade of school patient has completed: Psychiatrist Currently a student?: No Learning disability?: No  Employment/Work Situation:   Employment situation: Unemployed Patient's job has been impacted by current illness: Yes What is the longest time patient has a held a job?: 6 years 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   Financial Resources:   Financial resources: No income  Alcohol/Substance Abuse:   What has been your use of drugs/alcohol within the last 12 months?: ETOH- vodak, daily, 1/5 If attempted suicide, did drugs/alcohol play a role in this?: No Alcohol/Substance Abuse Treatment Hx: Denies past history Has alcohol/substance abuse ever caused legal problems?: No  Social Support System:   Heritage manager System: None Type of faith/religion: International aid/development worker:   Leisure and Hobbies: "firearms"  Strengths/Needs:   What is the patient's perception of their strengths?: "I don't know" Patient states these barriers may affect/interfere with their treatment: no Patient states these barriers may affect their return to the community: no  Discharge Plan:   Currently receiving community mental health services: No Patient states concerns and preferences for aftercare planning are: TBD with CSW- possible referral to [place] Does patient have access to  transportation?: Yes Does patient have financial  barriers related to discharge medications?: No Will patient be returning to same living situation after discharge?: Yes  Summary/Recommendations:   Summary and Recommendations (to be completed by the evaluator): Patient is a 48 year old male admitted involuntarily and diagnosed with Severe recurrent major depression without psychotic features.  The patient came to the ER complaining of suicidal ideation with a plan to shoot himself. He has been experiencing severe stressors since the fall of 2019 when he started experiencing health problems and was hospitalized at [facility] several times for heart problems to be told at the end that has scoliosis. Patient will benefit from crisis stabilization, medication evaluation, group therapy and psychoeducation. In addition to case management for discharge planning. At discharge it is recommended that patient adhere to the established discharge plan and continue treatment.   Icela Glymph  CUEBAS-COLON. 05/31/2018

## 2018-05-31 NOTE — Progress Notes (Signed)
D: Patient denies SI today.Contracts for safety. Denies HI and AV hallucinations. Mood is sad. Affect flat. Voices no complaints. Interacting appropriately with staff and peers. A: Continue to monitor for safety and offer support. R. Safety maintained

## 2018-05-31 NOTE — Progress Notes (Signed)
[facility] MD Progress Note  05/31/2018 7:20 PM Christian [name]  MRN:  [id number]  Subjective:    Christian [name] still has suicidal thought and feeols utterly hopeless. He is also nauseated from Prozac but wants to continue. He considers his situation dire. His sister is coming to see him tonight. He would like family meeting before discharge. There is reportedly a gun in the house.  No symptoms of alcohol withdrawal. VS are stable.  Principal Problem: Severe recurrent major depression without psychotic features ([place]) Diagnosis: Principal Problem:   Severe recurrent major depression without psychotic features ([place]) Active Problems:   Diabetes mellitus, type II ([place])   Suicidal ideation  Total Time spent with patient: 20 minutes  Past Psychiatric History: alcoholism  Past Medical History:  Past Medical History:  Diagnosis Date  . CAD (coronary artery disease)    a. 07/2013 NSTEMI/PCI: LCX 44m (4.0x23 Xience DES), RCA 33m, EF > 55%;  b. 07/2013 Echo: EF 60-65%, diast dysfxn, mild LVH, mildly dil LA, nl RV size/fxn, nl RVSP.  Marland Kitchen History of tobacco abuse    a. quit ~ 2010  . Hyperlipidemia   . Morbid obesity ([place])    a. weighed 168 @ age 59.    Past Surgical History:  Procedure Laterality Date  . ADENOIDECTOMY    . CARDIAC CATHETERIZATION  07/2013   [facility]'x1 stent   Family History:  Family History  Problem Relation Age of Onset  . Heart disease Mother   . Esophageal cancer Father    Family Psychiatric  History: depression Social History:  Social History   Substance and Sexual Activity  Alcohol Use No     Social History   Substance and Sexual Activity  Drug Use No  . Types: Marijuana   Comment: past    Social History   Socioeconomic History  . Marital status: Single    Spouse name: Not on file  . Number of children: Not on file  . Years of education: Not on file  . Highest education level: Not on file  Occupational History  . Not on file  Social Needs  . Financial resource  strain: Not on file  . Food insecurity:    Worry: Not on file    Inability: Not on file  . Transportation needs:    Medical: Not on file    Non-medical: Not on file  Tobacco Use  . Smoking status: Former Smoker    Packs/day: 0.50    Years: 20.00    Pack years: 10.00    Types: Cigarettes  . Smokeless tobacco: Never Used  Substance and Sexual Activity  . Alcohol use: No  . Drug use: No    Types: Marijuana    Comment: past  . Sexual activity: Not on file  Lifestyle  . Physical activity:    Days per week: Not on file    Minutes per session: Not on file  . Stress: Not on file  Relationships  . Social connections:    Talks on phone: Not on file    Gets together: Not on file    Attends religious service: Not on file    Active member of club or organization: Not on file    Attends meetings of clubs or organizations: Not on file    Relationship status: Not on file  Other Topics Concern  . Not on file  Social History Narrative  . Not on file   Additional Social History:  Sleep: Fair  Appetite:  Fair  Current Medications: Current Facility-Administered Medications  Medication Dose Route Frequency Provider Last Rate Last Dose  . acetaminophen (TYLENOL) tablet 1,000 mg  1,000 mg Oral Q6H PRN Clapacs, John T, MD      . acetaminophen (TYLENOL) tablet 650 mg  650 mg Oral Q6H PRN Clapacs, John T, MD      . alum & mag hydroxide-simeth (MAALOX/MYLANTA) 200-200-20 MG/5ML suspension 30 mL  30 mL Oral Q4H PRN Clapacs, John T, MD      . aspirin chewable tablet 81 mg  81 mg Oral Daily Clapacs, Madie Reno, MD   81 mg at 05/31/18 0825  . carvedilol (COREG) tablet 25 mg  25 mg Oral BID Clapacs, Madie Reno, MD   25 mg at 05/31/18 0825  . FLUoxetine (PROZAC) capsule 20 mg  20 mg Oral Daily Clapacs, Madie Reno, MD   20 mg at 05/31/18 0825  . glipiZIDE (GLUCOTROL) tablet 10 mg  10 mg Oral BID Clapacs, John T, MD   10 mg at 05/31/18 1616  . hydrOXYzine (ATARAX/VISTARIL)  tablet 50 mg  50 mg Oral TID PRN Clapacs, Madie Reno, MD      . ibuprofen (ADVIL,MOTRIN) tablet 600 mg  600 mg Oral Q6H PRN Clapacs, John T, MD      . insulin aspart (novoLOG) injection 0-5 Units  0-5 Units Subcutaneous QHS Ersilia Brawley B, MD   3 Units at 05/30/18 2112  . insulin aspart (novoLOG) injection 0-9 Units  0-9 Units Subcutaneous TID WC Clapacs, Madie Reno, MD   5 Units at 05/31/18 1608  . losartan (COZAAR) tablet 100 mg  100 mg Oral Daily Clapacs, Madie Reno, MD   100 mg at 05/31/18 0826  . magnesium hydroxide (MILK OF MAGNESIA) suspension 30 mL  30 mL Oral Daily PRN Clapacs, John T, MD      . metFORMIN (GLUCOPHAGE) tablet 1,000 mg  1,000 mg Oral BID WC Clapacs, John T, MD   1,000 mg at 05/31/18 1610  . rosuvastatin (CRESTOR) tablet 10 mg  10 mg Oral Daily Clapacs, Madie Reno, MD   10 mg at 05/31/18 0825  . traZODone (DESYREL) tablet 100 mg  100 mg Oral QHS PRN Clapacs, Madie Reno, MD        Lab Results:  Results for orders placed or performed during the hospital encounter of 05/29/18 (from the past 48 hour(s))  Hemoglobin A1c     Status: Abnormal   Collection Time: 05/30/18  6:51 AM  Result Value Ref Range   Hgb A1c MFr Bld 9.2 (H) 4.8 - 5.6 %    Comment: (NOTE) Pre diabetes:          5.7%-6.4% Diabetes:              >6.4% Glycemic control for   <7.0% adults with diabetes    Mean Plasma Glucose 217.34 mg/dL    Comment: Performed at [facility], [place] [address] [place], [place] [address]  Lipid panel     Status: Abnormal   Collection Time: 05/30/18  6:51 AM  Result Value Ref Range   Cholesterol 274 (H) 0 - 200 mg/dL   Triglycerides 665 (H) <150 mg/dL   HDL 37 (L) >40 mg/dL   Total CHOL/HDL Ratio 7.4 RATIO   VLDL UNABLE TO CALCULATE IF TRIGLYCERIDE OVER 400 mg/dL 0 - 40 mg/dL   LDL Cholesterol UNABLE TO CALCULATE IF TRIGLYCERIDE OVER 400 mg/dL 0 - 99 mg/dL    Comment:  Total Cholesterol/HDL:CHD Risk Coronary Heart Disease Risk Table                     Men   Women  1/2  Average Risk   3.4   3.3  Average Risk       5.0   4.4  2 X Average Risk   9.6   7.1  3 X Average Risk  23.4   11.0        Use the calculated Patient Ratio above and the CHD Risk Table to determine the patient's CHD Risk.        ATP III CLASSIFICATION (LDL):  <100     mg/dL   Optimal  100-129  mg/dL   Near or Above                    Optimal  130-159  mg/dL   Borderline  160-189  mg/dL   High  >190     mg/dL   Very High Performed at [facility], [address] [place], [place] [address]   TSH     Status: None   Collection Time: 05/30/18  6:51 AM  Result Value Ref Range   TSH 4.245 0.350 - 4.500 uIU/mL    Comment: Performed by a 3rd Generation assay with a functional sensitivity of <=0.01 uIU/mL. Performed at [facility], [address] [place], [place] [address]   Glucose, capillary     Status: Abnormal   Collection Time: 05/30/18  7:06 AM  Result Value Ref Range   Glucose-Capillary 323 (H) 70 - 99 mg/dL  Glucose, capillary     Status: Abnormal   Collection Time: 05/30/18 11:31 AM  Result Value Ref Range   Glucose-Capillary 288 (H) 70 - 99 mg/dL   Comment 1 Notify RN   Glucose, capillary     Status: Abnormal   Collection Time: 05/30/18  4:13 PM  Result Value Ref Range   Glucose-Capillary 297 (H) 70 - 99 mg/dL   Comment 1 Notify RN   Glucose, capillary     Status: Abnormal   Collection Time: 05/30/18  9:06 PM  Result Value Ref Range   Glucose-Capillary 266 (H) 70 - 99 mg/dL  Glucose, capillary     Status: Abnormal   Collection Time: 05/31/18  7:00 AM  Result Value Ref Range   Glucose-Capillary 266 (H) 70 - 99 mg/dL  Glucose, capillary     Status: Abnormal   Collection Time: 05/31/18 11:09 AM  Result Value Ref Range   Glucose-Capillary 293 (H) 70 - 99 mg/dL   Comment 1 Notify RN   Glucose, capillary     Status: Abnormal   Collection Time: 05/31/18  4:05 PM  Result Value Ref Range   Glucose-Capillary 269 (H) 70 - 99 mg/dL   Comment 1 Notify  RN     Blood Alcohol level:  Lab Results  Component Value Date   ETH 46 (H) 09/98/3382    Metabolic Disorder Labs: Lab Results  Component Value Date   HGBA1C 9.2 (H) 05/30/2018   MPG 217.34 05/30/2018   MPG 228.82 05/29/2018   No results found for: PROLACTIN Lab Results  Component Value Date   CHOL 274 (H) 05/30/2018   TRIG 665 (H) 05/30/2018   HDL 37 (L) 05/30/2018   CHOLHDL 7.4 05/30/2018   VLDL UNABLE TO CALCULATE IF TRIGLYCERIDE OVER 400 mg/dL 05/30/2018   LDLCALC UNABLE TO CALCULATE IF TRIGLYCERIDE OVER 400 mg/dL 05/30/2018  LDLCALC UNABLE TO CALCULATE IF TRIGLYCERIDE OVER 400 mg/dL 12/31/2017    Physical Findings: AIMS: Facial and Oral Movements Muscles of Facial Expression: None, normal Lips and Perioral Area: None, normal Jaw: None, normal Tongue: None, normal,Extremity Movements Upper (arms, wrists, hands, fingers): None, normal Lower (legs, knees, ankles, toes): None, normal, Trunk Movements Neck, shoulders, hips: None, normal, Overall Severity Severity of abnormal movements (highest score from questions above): None, normal Incapacitation due to abnormal movements: None, normal Patient's awareness of abnormal movements (rate only patient's report): No Awareness, Dental Status Current problems with teeth and/or dentures?: No Does patient usually wear dentures?: No  CIWA:  CIWA-Ar Total: 3 COWS:  COWS Total Score: 3  Musculoskeletal: Strength & Muscle Tone: within normal limits Gait & Station: normal Patient leans: N/A  Psychiatric Specialty Exam: Physical Exam  Nursing note and vitals reviewed. Psychiatric: His speech is normal and behavior is normal. His affect is blunt. Cognition and memory are normal. He expresses impulsivity. He exhibits a depressed mood. He expresses suicidal ideation. He expresses suicidal plans.    Review of Systems  Neurological: Negative.   Psychiatric/Behavioral: Positive for depression, substance abuse and suicidal ideas.   All other systems reviewed and are negative.   Blood pressure (!) 82/56, pulse 94, temperature 98.9 F (37.2 C), temperature source Oral, resp. rate 18, height 5\' 6"  (1.676 m), weight 78.5 kg, SpO2 100 %.Body mass index is 27.92 kg/m.  General Appearance: Casual  Eye Contact:  Good  Speech:  Clear and Coherent  Volume:  Decreased  Mood:  Anxious, Depressed, Hopeless and Worthless  Affect:  Blunt  Thought Process:  Goal Directed and Descriptions of Associations: Intact  Orientation:  Full (Time, Place, and Person)  Thought Content:  WDL  Suicidal Thoughts:  Yes.  with intent/plan  Homicidal Thoughts:  No  Memory:  Immediate;   Fair Recent;   Fair Remote;   Fair  Judgement:  Impaired  Insight:  Shallow  Psychomotor Activity:  Normal  Concentration:  Concentration: Fair and Attention Span: Fair  Recall:  [employer] of Knowledge:  Fair  Language:  Fair  Akathisia:  No  Handed:  Right  AIMS (if indicated):     Assets:  Communication Skills Desire for Improvement Housing Resilience Social Support  ADL's:  Intact  Cognition:  WNL  Sleep:  Number of Hours: 7     Treatment Plan Summary: Daily contact with patient to assess and evaluate symptoms and progress in treatment and Medication management   Christian [name] is a 48 year old mal with a history of alcoholism admitted for suicidal ideation in the context of social stressors.  #Suicidal ideation -patient able to contract for safety  #Mood -Prozac 20 mg daily -Trazodone 100 mg nightly  #Alcohhol abuse -denies symptoms of withdrawal -VS are stable  #DM -ADA diet -Metformin 1000 mg BID -Glucotrol 10 mg BID -SSI -input from [facility] ppreciated  #HTN 100 mg daily -Coreg 25 mg daily -Asa 81 mg daily -Cozaar 100 mg daily  #Dyslipidemia -lipids quite elevated, possibly from drinking -recheck in few days -Crestor 10 mg daily  #Disposition -discharge to home -follow up with Dellie Burns,  MD 05/31/2018, 7:20 PM

## 2018-06-01 LAB — GLUCOSE, CAPILLARY
GLUCOSE-CAPILLARY: 308 mg/dL — AB (ref 70–99)
Glucose-Capillary: 210 mg/dL — ABNORMAL HIGH (ref 70–99)
Glucose-Capillary: 175 mg/dL — ABNORMAL HIGH (ref 70–99)
Glucose-Capillary: 231 mg/dL — ABNORMAL HIGH (ref 70–99)

## 2018-06-01 MED ORDER — INSULIN GLARGINE 100 UNIT/ML ~~LOC~~ SOLN
10.0000 [IU] | Freq: Every day | SUBCUTANEOUS | Status: DC
  Administered 2018-06-01 – 2018-06-03 (×3): 10 [IU] via SUBCUTANEOUS
  Filled 2018-06-01 (×6): qty 0.1

## 2018-06-01 MED ORDER — FLUOXETINE HCL 20 MG PO CAPS
20.0000 mg | ORAL_CAPSULE | Freq: Every day | ORAL | Status: DC
  Administered 2018-06-02 – 2018-06-03 (×2): 20 mg via ORAL
  Filled 2018-06-01 (×2): qty 1

## 2018-06-01 MED ORDER — ONDANSETRON 4 MG PO TBDP
4.0000 mg | ORAL_TABLET | Freq: Three times a day (TID) | ORAL | Status: DC | PRN
  Administered 2018-06-01 – 2018-06-03 (×3): 4 mg via ORAL
  Filled 2018-06-01 (×3): qty 1

## 2018-06-01 NOTE — Progress Notes (Signed)
Recreation Therapy Notes  Date: 06/01/2018  Time: 9:30 am  Location: Craft Room  Behavioral response: Appropriate   Intervention Topic: Strengths  Discussion/Intervention:  Group content today was focused on strengths. The group identified some of the strengths they have. Individuals stated reason why they do not use their strengths. Patients expressed what strengths others see in them. The group identified important reason to use their strengths. The group participated in the intervention "Picking strengths", where they had a chance to identify some of their strengths. Clinical Observations/Feedback:  Patient came to group late due to unknown reasons. He was pulled from group by peer support. Individual later returned and participated in the intervention.    Kamren Heskett LRT/CTRS         Ameri Cahoon 06/01/2018 11:35 AM

## 2018-06-01 NOTE — BHH Group Notes (Signed)
[place] Group Notes:  (Nursing/MHT/Case Management/Adjunct)  Date:  06/01/2018  Time:  10:26 PM  Type of Therapy:  Group Therapy  Participation Level:  Active  Participation Quality:  Appropriate  Affect:  Appropriate  Cognitive:  Appropriate  Insight:  Appropriate  Engagement in Group:  Engaged  Modes of Intervention:  Discussion  Summary of Progress/Problems:  Kandis Fantasia 06/01/2018, 10:26 PM

## 2018-06-01 NOTE — Plan of Care (Signed)
Patient present in the milieu with a steady gait. Compliant with meals and medications. Declined to eat lunch this afternoon because he was nauseous. MD informed of patient's nausea and Zofran prn ordered. Denies current SI but continues to endorse depression. His goal for today is to call his mother and try to get her some help , (recent diagnosis of dementia). Milieu remains safe with q 15 minute safety checks. Will continue to monitor.

## 2018-06-01 NOTE — Progress Notes (Signed)
[facility] MD Progress Note  06/01/2018 11:53 AM Christian [name]  MRN:  [id number]  Subjective:    Christian [name] met with treatment team today. He claims to feel better but it hardly shows. Vomited twice from Prozac but resists any medication changes. He has DM and it is important that he eats his meals. We started Lantus 10 units today as per [facility]. Patint seems unfocused, neglectfull about his health. Requests family meeting before discharge.  Principal Problem: Severe recurrent major depression without psychotic features ([place]) Diagnosis: Principal Problem:   Severe recurrent major depression without psychotic features ([place]) Active Problems:   Diabetes mellitus, type II ([place])   Suicidal ideation  Total Time spent with patient: 20 minutes  Past Psychiatric History: alcoholism  Past Medical History:  Past Medical History:  Diagnosis Date  . CAD (coronary artery disease)    a. 07/2013 NSTEMI/PCI: LCX 38m(4.0x23 Xience DES), RCA 463mEF > 55%;  b. 07/2013 Echo: EF 60-65%, diast dysfxn, mild LVH, mildly dil LA, nl RV size/fxn, nl RVSP.  . Marland Kitchenistory of tobacco abuse    a. quit ~ 2010  . Hyperlipidemia   . [place]   a. weighed 168 @ age 89   Past Surgical History:  Procedure Laterality Date  . ADENOIDECTOMY    . CARDIAC CATHETERIZATION  07/2013   [facility]'x1 stent   Family History:  Family History  Problem Relation Age of Onset  . Heart disease Mother   . Esophageal cancer Father    Family Psychiatric  History: none Social History:  Social History   Substance and Sexual Activity  Alcohol Use No     Social History   Substance and Sexual Activity  Drug Use No  . Types: Marijuana   Comment: past    Social History   Socioeconomic History  . Marital status: Single    Spouse name: Not on file  . Number of children: Not on file  . Years of education: Not on file  . Highest education level: Not on file  Occupational History  . Not on file  Social Needs  . Financial  resource strain: Not on file  . Food insecurity:    Worry: Not on file    Inability: Not on file  . Transportation needs:    Medical: Not on file    Non-medical: Not on file  Tobacco Use  . Smoking status: Former Smoker    Packs/day: 0.50    Years: 20.00    Pack years: 10.00    Types: Cigarettes  . Smokeless tobacco: Never Used  Substance and Sexual Activity  . Alcohol use: No  . Drug use: No    Types: Marijuana    Comment: past  . Sexual activity: Not on file  Lifestyle  . Physical activity:    Days per week: Not on file    Minutes per session: Not on file  . Stress: Not on file  Relationships  . Social connections:    Talks on phone: Not on file    Gets together: Not on file    Attends religious service: Not on file    Active member of club or organization: Not on file    Attends meetings of clubs or organizations: Not on file    Relationship status: Not on file  Other Topics Concern  . Not on file  Social History Narrative  . Not on file   Additional Social History:  Sleep: Fair  Appetite:  Poor  Current Medications: Current Facility-Administered Medications  Medication Dose Route Frequency Provider Last Rate Last Dose  . acetaminophen (TYLENOL) tablet 1,000 mg  1,000 mg Oral Q6H PRN Clapacs, John T, MD      . acetaminophen (TYLENOL) tablet 650 mg  650 mg Oral Q6H PRN Clapacs, John T, MD      . alum & mag hydroxide-simeth (MAALOX/MYLANTA) 200-200-20 MG/5ML suspension 30 mL  30 mL Oral Q4H PRN Clapacs, John T, MD      . aspirin chewable tablet 81 mg  81 mg Oral Daily Clapacs, Madie Reno, MD   81 mg at 06/01/18 0810  . carvedilol (COREG) tablet 25 mg  25 mg Oral BID Clapacs, Madie Reno, MD   25 mg at 06/01/18 0810  . [START ON 06/02/2018] FLUoxetine (PROZAC) capsule 20 mg  20 mg Oral QHS Pucilowska, Jolanta B, MD      . glipiZIDE (GLUCOTROL) tablet 10 mg  10 mg Oral BID Clapacs, John T, MD   10 mg at 06/01/18 7408  . hydrOXYzine  (ATARAX/VISTARIL) tablet 50 mg  50 mg Oral TID PRN Clapacs, Madie Reno, MD      . ibuprofen (ADVIL,MOTRIN) tablet 600 mg  600 mg Oral Q6H PRN Clapacs, John T, MD      . insulin aspart (novoLOG) injection 0-5 Units  0-5 Units Subcutaneous QHS Pucilowska, Jolanta B, MD   2 Units at 05/31/18 2108  . insulin aspart (novoLOG) injection 0-9 Units  0-9 Units Subcutaneous TID WC Clapacs, Madie Reno, MD   7 Units at 06/01/18 1139  . insulin glargine (LANTUS) injection 10 Units  10 Units Subcutaneous Daily Pucilowska, Jolanta B, MD   10 Units at 06/01/18 1138  . losartan (COZAAR) tablet 100 mg  100 mg Oral Daily Clapacs, Madie Reno, MD   100 mg at 06/01/18 0811  . magnesium hydroxide (MILK OF MAGNESIA) suspension 30 mL  30 mL Oral Daily PRN Clapacs, John T, MD      . metFORMIN (GLUCOPHAGE) tablet 1,000 mg  1,000 mg Oral BID WC Clapacs, Madie Reno, MD   1,000 mg at 06/01/18 0810  . ondansetron (ZOFRAN-ODT) disintegrating tablet 4 mg  4 mg Oral Q8H PRN Pucilowska, Jolanta B, MD      . rosuvastatin (CRESTOR) tablet 10 mg  10 mg Oral Daily Clapacs, Madie Reno, MD   10 mg at 06/01/18 0811  . traZODone (DESYREL) tablet 100 mg  100 mg Oral QHS PRN Clapacs, Madie Reno, MD        Lab Results:  Results for orders placed or performed during the hospital encounter of 05/29/18 (from the past 48 hour(s))  Glucose, capillary     Status: Abnormal   Collection Time: 05/30/18  4:13 PM  Result Value Ref Range   Glucose-Capillary 297 (H) 70 - 99 mg/dL   Comment 1 Notify RN   Glucose, capillary     Status: Abnormal   Collection Time: 05/30/18  9:06 PM  Result Value Ref Range   Glucose-Capillary 266 (H) 70 - 99 mg/dL  Glucose, capillary     Status: Abnormal   Collection Time: 05/31/18  7:00 AM  Result Value Ref Range   Glucose-Capillary 266 (H) 70 - 99 mg/dL  Glucose, capillary     Status: Abnormal   Collection Time: 05/31/18 11:09 AM  Result Value Ref Range   Glucose-Capillary 293 (H) 70 - 99 mg/dL   Comment 1 Notify RN   Glucose,  capillary  Status: Abnormal   Collection Time: 05/31/18  4:05 PM  Result Value Ref Range   Glucose-Capillary 269 (H) 70 - 99 mg/dL   Comment 1 Notify RN   Glucose, capillary     Status: Abnormal   Collection Time: 05/31/18  8:30 PM  Result Value Ref Range   Glucose-Capillary 224 (H) 70 - 99 mg/dL   Comment 1 Notify RN   Glucose, capillary     Status: Abnormal   Collection Time: 06/01/18  7:09 AM  Result Value Ref Range   Glucose-Capillary 210 (H) 70 - 99 mg/dL   Comment 1 Notify RN   Glucose, capillary     Status: Abnormal   Collection Time: 06/01/18 11:09 AM  Result Value Ref Range   Glucose-Capillary 308 (H) 70 - 99 mg/dL   Comment 1 Notify RN     Blood Alcohol level:  Lab Results  Component Value Date   ETH 46 (H) 78/67/6720    Metabolic Disorder Labs: Lab Results  Component Value Date   HGBA1C 9.2 (H) 05/30/2018   MPG 217.34 05/30/2018   MPG 228.82 05/29/2018   No results found for: PROLACTIN Lab Results  Component Value Date   CHOL 274 (H) 05/30/2018   TRIG 665 (H) 05/30/2018   HDL 37 (L) 05/30/2018   CHOLHDL 7.4 05/30/2018   VLDL UNABLE TO CALCULATE IF TRIGLYCERIDE OVER 400 mg/dL 05/30/2018   LDLCALC UNABLE TO CALCULATE IF TRIGLYCERIDE OVER 400 mg/dL 05/30/2018   LDLCALC UNABLE TO CALCULATE IF TRIGLYCERIDE OVER 400 mg/dL 12/31/2017    Physical Findings: AIMS: Facial and Oral Movements Muscles of Facial Expression: None, normal Lips and Perioral Area: None, normal Jaw: None, normal Tongue: None, normal,Extremity Movements Upper (arms, wrists, hands, fingers): None, normal Lower (legs, knees, ankles, toes): None, normal, Trunk Movements Neck, shoulders, hips: None, normal, Overall Severity Severity of abnormal movements (highest score from questions above): None, normal Incapacitation due to abnormal movements: None, normal Patient's awareness of abnormal movements (rate only patient's report): No Awareness, Dental Status Current problems with teeth  and/or dentures?: No Does patient usually wear dentures?: No  CIWA:  CIWA-Ar Total: 3 COWS:  COWS Total Score: 3  Musculoskeletal: Strength & Muscle Tone: within normal limits Gait & Station: normal Patient leans: N/A  Psychiatric Specialty Exam: Physical Exam  Nursing note and vitals reviewed. Psychiatric: His speech is normal. Thought content normal. His mood appears anxious. He is withdrawn. Cognition and memory are normal. He expresses impulsivity. He exhibits a depressed mood.    Review of Systems  Neurological: Negative.   Psychiatric/Behavioral: Positive for depression and substance abuse.  All other systems reviewed and are negative.   Blood pressure 102/80, pulse 95, temperature 98.9 F (37.2 C), temperature source Oral, resp. rate 18, height _0  (1.676 m), weight 78.5 kg, SpO2 100 %.Body mass index is 27.92 kg/m.  General Appearance: Casual  Eye Contact:  Good  Speech:  Clear and Coherent  Volume:  Decreased  Mood:  Depressed, Hopeless and Worthless  Affect:  Blunt  Thought Process:  Goal Directed and Descriptions of Associations: Intact  Orientation:  Full (Time, Place, and Person)  Thought Content:  WDL  Suicidal Thoughts:  No  Homicidal Thoughts:  No  Memory:  Immediate;   Fair Recent;   Fair Remote;   Fair  Judgement:  Poor  Insight:  Lacking  Psychomotor Activity:  Psychomotor Retardation  Concentration:  Concentration: Fair and Attention Span: Fair  Recall:  [employer] of Knowledge:  Fair  Language:  Fair  Akathisia:  No  Handed:  Right  AIMS (if indicated):     Assets:  Communication Skills Desire for Improvement Housing Resilience Social Support  ADL's:  Intact  Cognition:  WNL  Sleep:  Number of Hours: 6.5     Treatment Plan Summary: Daily contact with patient to assess and evaluate symptoms and progress in treatment and Medication management   Christian [name] is a 48 year old mal with a history of alcoholism admitted for suicidal ideation in  the context of social stressors.  #Suicidal ideation -patient able to contract for safety  #Mood -Prozac 20 mg nightly -Trazodone 100 mg nightly  #Nausea -Zofran 4 mg PRN  #Alcohhol abuse -denies symptoms of withdrawal -VS are stable  #DM -ADA diet -Lantus 10 units daily -Metformin 1000 mg BID -Glucotrol 10 mg BID -SSI -input from [facility] ppreciated  #HTN 100 mg daily -Coreg 25 mg daily -Asa 81 mg daily -Cozaar 100 mg daily  #Dyslipidemia -lipids quite elevated, possibly from drinking -recheck in few days -Crestor 10 mg daily  #Disposition -discharge to home -follow up with Dellie Burns, MD 06/01/2018, 11:53 AM

## 2018-06-01 NOTE — Tx Team (Addendum)
Interdisciplinary Treatment and Diagnostic Plan Update  06/01/2018 Time of Session: 10:30AM  Christian [name] MRN: [id number]  Principal Diagnosis: Severe recurrent major depression without psychotic features [facility])  Secondary Diagnoses: Principal Problem:   Severe recurrent major depression without psychotic features ([place]) Active Problems:   Diabetes mellitus, type II ([place])   Suicidal ideation   Current Medications:  Current Facility-Administered Medications  Medication Dose Route Frequency Provider Last Rate Last Dose  . acetaminophen (TYLENOL) tablet 1,000 mg  1,000 mg Oral Q6H PRN Clapacs, John T, MD      . acetaminophen (TYLENOL) tablet 650 mg  650 mg Oral Q6H PRN Clapacs, John T, MD      . alum & mag hydroxide-simeth (MAALOX/MYLANTA) 200-200-20 MG/5ML suspension 30 mL  30 mL Oral Q4H PRN Clapacs, John T, MD      . aspirin chewable tablet 81 mg  81 mg Oral Daily Clapacs, Madie Reno, MD   81 mg at 06/01/18 0810  . carvedilol (COREG) tablet 25 mg  25 mg Oral BID Clapacs, Madie Reno, MD   25 mg at 06/01/18 0810  . [START ON 06/02/2018] FLUoxetine (PROZAC) capsule 20 mg  20 mg Oral QHS Pucilowska, Jolanta B, MD      . glipiZIDE (GLUCOTROL) tablet 10 mg  10 mg Oral BID Clapacs, John T, MD   10 mg at 06/01/18 3546  . hydrOXYzine (ATARAX/VISTARIL) tablet 50 mg  50 mg Oral TID PRN Clapacs, Madie Reno, MD      . ibuprofen (ADVIL,MOTRIN) tablet 600 mg  600 mg Oral Q6H PRN Clapacs, John T, MD      . insulin aspart (novoLOG) injection 0-5 Units  0-5 Units Subcutaneous QHS Pucilowska, Jolanta B, MD   2 Units at 05/31/18 2108  . insulin aspart (novoLOG) injection 0-9 Units  0-9 Units Subcutaneous TID WC Clapacs, Madie Reno, MD   7 Units at 06/01/18 1139  . insulin glargine (LANTUS) injection 10 Units  10 Units Subcutaneous Daily Pucilowska, Jolanta B, MD   10 Units at 06/01/18 1138  . losartan (COZAAR) tablet 100 mg  100 mg Oral Daily Clapacs, Madie Reno, MD   100 mg at 06/01/18 0811  . magnesium hydroxide (MILK OF  MAGNESIA) suspension 30 mL  30 mL Oral Daily PRN Clapacs, John T, MD      . metFORMIN (GLUCOPHAGE) tablet 1,000 mg  1,000 mg Oral BID WC Clapacs, Madie Reno, MD   1,000 mg at 06/01/18 0810  . ondansetron (ZOFRAN-ODT) disintegrating tablet 4 mg  4 mg Oral Q8H PRN Pucilowska, Jolanta B, MD      . rosuvastatin (CRESTOR) tablet 10 mg  10 mg Oral Daily Clapacs, Madie Reno, MD   10 mg at 06/01/18 0811  . traZODone (DESYREL) tablet 100 mg  100 mg Oral QHS PRN Clapacs, Madie Reno, MD       PTA Medications: Medications Prior to Admission  Medication Sig Dispense Refill Last Dose  . acetaminophen (TYLENOL) 500 MG tablet Take 1,000 mg by mouth every 6 (six) hours as needed.   Not Taking at Unknown time  . aspirin 81 MG chewable tablet Chew 1 tablet (81 mg total) by mouth daily. 30 tablet 0   . carvedilol (COREG) 25 MG tablet Take 1 tablet (25 mg total) by mouth 2 (two) times daily. (Patient not taking: Reported on 05/08/2018) 60 tablet 11 Not Taking at Unknown time  . glipiZIDE (GLUCOTROL) 10 MG tablet Take 1 tablet (10 mg total) by mouth 2 (two) times daily. (  Patient not taking: Reported on 05/08/2018) 60 tablet 11 Not Taking at Unknown time  . losartan (COZAAR) 100 MG tablet Take 1 tablet (100 mg total) by mouth daily. (Patient not taking: Reported on 05/08/2018) 30 tablet 11 Not Taking at Unknown time  . metFORMIN (GLUCOPHAGE) 1000 MG tablet Take 1 tablet (1,000 mg total) by mouth 2 (two) times daily with a meal. (Patient not taking: Reported on 05/08/2018) 60 tablet 0 Not Taking at Unknown time  . rosuvastatin (CRESTOR) 10 MG tablet Take 1 tablet (10 mg total) by mouth daily. (Patient not taking: Reported on 05/08/2018) 30 tablet 0 Not Taking at Unknown time    Patient Stressors: Financial difficulties Health problems Marital or family conflict Occupational concerns  Patient Strengths: Capable of independent living Supportive family/friends  Treatment Modalities: Medication Management, Group therapy, Case  management,  1 to 1 session with clinician, Psychoeducation, Recreational therapy.   Physician Treatment Plan for Primary Diagnosis: Severe recurrent major depression without psychotic features ([place]) Long Term Goal(s): Improvement in symptoms so as ready for discharge Improvement in symptoms so as ready for discharge   Short Term Goals: Ability to identify changes in lifestyle to reduce recurrence of condition will improve Ability to verbalize feelings will improve Ability to disclose and discuss suicidal ideas Ability to demonstrate self-control will improve Ability to identify and develop effective coping behaviors will improve Ability to maintain clinical measurements within normal limits will improve Compliance with prescribed medications will improve Ability to identify triggers associated with substance abuse/mental health issues will improve Ability to identify changes in lifestyle to reduce recurrence of condition will improve Ability to demonstrate self-control will improve Ability to identify triggers associated with substance abuse/mental health issues will improve  Medication Management: Evaluate patient's response, side effects, and tolerance of medication regimen.  Therapeutic Interventions: 1 to 1 sessions, Unit Group sessions and Medication administration.  Evaluation of Outcomes: Progressing  Physician Treatment Plan for Secondary Diagnosis: Principal Problem:   Severe recurrent major depression without psychotic features ([place]) Active Problems:   Diabetes mellitus, type II ([place])   Suicidal ideation  Long Term Goal(s): Improvement in symptoms so as ready for discharge Improvement in symptoms so as ready for discharge   Short Term Goals: Ability to identify changes in lifestyle to reduce recurrence of condition will improve Ability to verbalize feelings will improve Ability to disclose and discuss suicidal ideas Ability to demonstrate self-control will  improve Ability to identify and develop effective coping behaviors will improve Ability to maintain clinical measurements within normal limits will improve Compliance with prescribed medications will improve Ability to identify triggers associated with substance abuse/mental health issues will improve Ability to identify changes in lifestyle to reduce recurrence of condition will improve Ability to demonstrate self-control will improve Ability to identify triggers associated with substance abuse/mental health issues will improve     Medication Management: Evaluate patient's response, side effects, and tolerance of medication regimen.  Therapeutic Interventions: 1 to 1 sessions, Unit Group sessions and Medication administration.  Evaluation of Outcomes: Progressing   RN Treatment Plan for Primary Diagnosis: Severe recurrent major depression without psychotic features ([place]) Long Term Goal(s): Knowledge of disease and therapeutic regimen to maintain health will improve  Short Term Goals: Ability to demonstrate self-control, Ability to verbalize feelings will improve, Ability to disclose and discuss suicidal ideas, Ability to identify and develop effective coping behaviors will improve and Compliance with prescribed medications will improve  Medication Management: RN will administer medications as ordered by provider, will assess  and evaluate patient's response and provide education to patient for prescribed medication. RN will report any adverse and/or side effects to prescribing provider.  Therapeutic Interventions: 1 on 1 counseling sessions, Psychoeducation, Medication administration, Evaluate responses to treatment, Monitor vital signs and CBGs as ordered, Perform/monitor CIWA, COWS, AIMS and Fall Risk screenings as ordered, Perform wound care treatments as ordered.  Evaluation of Outcomes: Progressing   LCSW Treatment Plan for Primary Diagnosis: Severe recurrent major depression without  psychotic features ([place]) Long Term Goal(s): Safe transition to appropriate next level of care at discharge, Engage patient in therapeutic group addressing interpersonal concerns.  Short Term Goals: Engage patient in aftercare planning with referrals and resources, Increase social support, Increase emotional regulation, Facilitate acceptance of mental health diagnosis and concerns and Increase skills for wellness and recovery  Therapeutic Interventions: Assess for all discharge needs, 1 to 1 time with Social worker, Explore available resources and support systems, Assess for adequacy in community support network, Educate family and significant other(s) on suicide prevention, Complete Psychosocial Assessment, Interpersonal group therapy.  Evaluation of Outcomes: Progressing   Progress in Treatment: Attending groups: Yes. Participating in groups: Yes. Taking medication as prescribed: Yes. Toleration medication: Yes. Family/Significant other contact made: No, will contact:  once permission has been given. Patient understands diagnosis: Yes. Discussing patient identified problems/goals with staff: Yes. Medical problems stabilized or resolved: Yes. Denies suicidal/homicidal ideation: Yes. Issues/concerns per patient self-inventory: No. Other: none  New problem(s) identified: No, Describe:  none  New Short Term/Long Term Goal(s): elimination of symptoms of psychosis, medication management for mood stabilization; elimination of SI thoughts; development of comprehensive mental wellness/sobriety plan.  Patient Goals:  "try to get over this depression so I can take care of my financial problems"  Discharge Plan or Barriers: Patient identifies no barriers at this time.   Reason for Continuation of Hospitalization: Anxiety Depression Mania Medication stabilization Suicidal ideation  Estimated Length of Stay: 1-5 days  Recreational Therapy: Patient Stressors: N/A Patient Goal: Patient  will engage in groups without prompting or encouragement from LRT x3 group sessions within 5 recreation therapy group sessions  Attendees: Patient: Christian [name] 06/01/2018 12:13 PM  Physician: Dr. Cheron Schaumann, MD 06/01/2018 12:13 PM  Nursing: Eugenio Hoes, RN 06/01/2018 12:13 PM  RN Care Manager: 06/01/2018 12:13 PM  Social Worker: Assunta Curtis, LCSW 06/01/2018 12:13 PM  Recreational Therapist: Devin Going, LRT 06/01/2018 12:13 PM  Other: Evalina Field, [place] 06/01/2018 12:13 PM  Other: Sanjuana Kava, LCSW 06/01/2018 12:13 PM  Other: 06/01/2018 12:13 PM    Scribe for Treatment Team: Rozann Lesches, LCSW 06/01/2018 12:13 PM

## 2018-06-01 NOTE — Progress Notes (Signed)
Inpatient Diabetes Program Recommendations  AACE/ADA: New Consensus Statement on Inpatient Glycemic Control   Target Ranges:  Prepandial:   less than 140 mg/dL      Peak postprandial:   less than 180 mg/dL (1-2 hours)      Critically ill patients:  140 - 180 mg/dL   Results for Christian [name], Christian [name] (MRN [id number]) as of 06/01/2018 08:20  Ref. Range 05/31/2018 07:00 05/31/2018 11:09 05/31/2018 16:05 05/31/2018 20:30 06/01/2018 07:09  Glucose-Capillary Latest Ref Range: 70 - 99 mg/dL 266 (H) 293 (H) 269 (H) 224 (H) 210 (H)    Review of Glycemic Control  Diabetes history: DM2 Outpatient Diabetes medications: Glipizide 10 mg BID, Metformin 1000 mg BID Current orders for Inpatient glycemic control: Novolog 0-9 units TID with meals, Novolog 0-5 units QHS, Glipizide 10 mg BID, Metformin 1000 mg BID  Inpatient Diabetes Program Recommendations:  Insulin - Basal: Please consider ordering Lantus 10 units daily (to start now).  Thanks, Barnie Alderman, RN, MSN, CDE Diabetes Coordinator Inpatient Diabetes Program [phone] (Team Pager from 8am to 5pm)

## 2018-06-01 NOTE — [facility] Suicide Risk Assessment (Signed)
[place] INPATIENT:  Family/Significant Other Suicide Prevention Education  Suicide Prevention Education:  Contact Attempts: Larina Earthly, sister, [phone] has been identified by the patient as the family member/significant other with whom the patient will be residing, and identified as the person(s) who will aid the patient in the event of a mental health crisis.  With written consent from the patient, two attempts were made to provide suicide prevention education, prior to and/or following the patient's discharge.  We were unsuccessful in providing suicide prevention education.  A suicide education pamphlet was given to the patient to share with family/significant other.  Date and time of first attempt: 06/01/2018 at 3:27pm Date and time of second attempt: Second attempt is needed  Rozann Lesches 06/01/2018, 3:26 PM

## 2018-06-01 NOTE — [facility] Group Notes (Signed)
LCSW Group Therapy Note   06/01/2018 1:00 PM  Type of Therapy and Topic:  Group Therapy:  Overcoming Obstacles   Participation Level:  Minimal   Description of Group:    In this group patients will be encouraged to explore what they see as obstacles to their own wellness and recovery. They will be guided to discuss their thoughts, feelings, and behaviors related to these obstacles. The group will process together ways to cope with barriers, with attention given to specific choices patients can make. Each patient will be challenged to identify changes they are motivated to make in order to overcome their obstacles. This group will be process-oriented, with patients participating in exploration of their own experiences as well as giving and receiving support and challenge from other group members.   Therapeutic Goals: 1. Patient will identify personal and current obstacles as they relate to admission. 2. Patient will identify barriers that currently interfere with their wellness or overcoming obstacles.  3. Patient will identify feelings, thought process and behaviors related to these barriers. 4. Patient will identify two changes they are willing to make to overcome these obstacles:      Summary of Patient Progress Patient came to group later.  Patient idenitified his obstacles as being "Alcoholism".  Patient also identified that he has been struggling with his mother who has been diagnosed with dementia.  Patient was active and attentive in group.  Patient was supportive of other group members.     Therapeutic Modalities:   Cognitive Behavioral Therapy Solution Focused Therapy Motivational Interviewing Relapse Prevention Therapy  Assunta Curtis, MSW, LCSW 06/01/2018 2:27 PM

## 2018-06-01 NOTE — [facility] Suicide Risk Assessment (Signed)
[facility] INPATIENT:  Family/Significant Other Suicide Prevention Education  Suicide Prevention Education:  Education Completed; Larina Earthly, sister, [phone] has been identified by the patient as the family member/significant other with whom the patient will be residing, and identified as the person(s) who will aid the patient in the event of a mental health crisis (suicidal ideations/suicide attempt).  With written consent from the patient, the family member/significant other has been provided the following suicide prevention education, prior to the and/or following the discharge of the patient.  The suicide prevention education provided includes the following:  Suicide risk factors  Suicide prevention and interventions  National Suicide Hotline telephone number  [facility] assessment telephone number  [facility] Emergency Assistance [place] and/or Residential Mobile Crisis Unit telephone number  Request made of family/significant other to:  Remove weapons (e.g., guns, rifles, knives), all items previously/currently identified as safety concern.    Remove drugs/medications (over-the-counter, prescriptions, illicit drugs), all items previously/currently identified as a safety concern.  The family member/significant other verbalizes understanding of the suicide prevention education information provided.  The family member/significant other agrees to remove the items of safety concern listed above.  Christian [name] 06/01/2018, 3:38 PM

## 2018-06-02 LAB — GLUCOSE, CAPILLARY
Glucose-Capillary: 146 mg/dL — ABNORMAL HIGH (ref 70–99)
Glucose-Capillary: 189 mg/dL — ABNORMAL HIGH (ref 70–99)
Glucose-Capillary: 120 mg/dL — ABNORMAL HIGH (ref 70–99)
Glucose-Capillary: 131 mg/dL — ABNORMAL HIGH (ref 70–99)

## 2018-06-02 MED ORDER — PNEUMOCOCCAL VAC POLYVALENT 25 MCG/0.5ML IJ INJ
0.5000 mL | INJECTION | INTRAMUSCULAR | Status: DC
  Filled 2018-06-02: qty 0.5

## 2018-06-02 MED ORDER — INFLUENZA VAC SPLIT QUAD 0.5 ML IM SUSY: 0.5000 mL | PREFILLED_SYRINGE | INTRAMUSCULAR | Status: DC

## 2018-06-02 NOTE — Progress Notes (Signed)
D - Patient was in his room upon arrival to the unit. Patient was pleasant during assessment and medication administration. Patient denies SI/HI/AVH and pain. Patient endorses depression but denies anxiety. Patient stated to this writer, "I don't know what anxiety is." Patient given education. Patient stated, "I am not having any of those feelings writer now."   A - Patient compliant with medication administration per MD orders and procedures on the unit. Patient given education. Patient given support and encouragement to be active in his treatment plan. Patient informed to let staff know if there are any issues or problems on the unit.   R - Patient being monitored Q 15 minutes for safety per unit protocol. Patient remains safe.

## 2018-06-02 NOTE — Progress Notes (Signed)
Recreation Therapy Notes   Date: 06/02/2018  Time: 9:30 am   Location: Craft room   Behavioral response: N/A   Intervention Topic: Happiness  Discussion/Intervention: Patient did not attend group.   Clinical Observations/Feedback:  Patient did not attend group.   Qianna Clagett LRT/CTRS        Treysean Petruzzi 06/02/2018 10:41 AM

## 2018-06-02 NOTE — [facility] Group Notes (Signed)
Feelings Around Diagnosis 06/02/2018 1PM  Type of Therapy/Topic:  Group Therapy:  Feelings about Diagnosis  Participation Level:  Did Not Attend   Description of Group:   This group will allow patients to explore their thoughts and feelings about diagnoses they have received. Patients will be guided to explore their level of understanding and acceptance of these diagnoses. Facilitator will encourage patients to process their thoughts and feelings about the reactions of others to their diagnosis and will guide patients in identifying ways to discuss their diagnosis with significant others in their lives. This group will be process-oriented, with patients participating in exploration of their own experiences, giving and receiving support, and processing challenge from other group members.   Therapeutic Goals: 1. Patient will demonstrate understanding of diagnosis as evidenced by identifying two or more symptoms of the disorder 2. Patient will be able to express two feelings regarding the diagnosis 3. Patient will demonstrate their ability to communicate their needs through discussion and/or role play  Summary of Patient Progress:       Therapeutic Modalities:   Cognitive Behavioral Therapy Brief Therapy Feelings Identification    Yvette Rack, LCSW 06/02/2018 4:30 PM

## 2018-06-02 NOTE — Progress Notes (Signed)
Recreation Therapy Notes  INPATIENT RECREATION THERAPY ASSESSMENT  Patient Details Name: Christian [name] MRN: [id number] DOB: 1970/05/07 Today's Date: 06/02/2018       Information Obtained From: Patient  Able to Participate in Assessment/Interview: Yes  Patient Presentation: Responsive  Reason for Admission (Per Patient): Active Symptoms, Suicidal Ideation  Patient Stressors:    Coping Skills:   Substance Abuse, Talk  Leisure Interests (2+):  (Not much)  Frequency of Recreation/Participation:    Awareness of Community Resources:     [employer]:     Current Use:    If no, Barriers?:    Expressed Interest in [employer] Information:    [place] of Residence:  Journalist, newspaper  Patient Main Form of Transportation:    Patient Strengths:  Caring  Patient Identified Areas of Improvement:  Feeling better  Patient Goal for Hospitalization:  Get pass my depression and work on my financial problems  Current SI (including self-harm):  No  Current HI:  No  Current AVH: No  Staff Intervention Plan: Group Attendance, Collaborate with Interdisciplinary Treatment Team  Consent to Intern Participation: N/A  Sherle Mello 06/02/2018, 1:57 PM

## 2018-06-02 NOTE — Plan of Care (Signed)
  Problem: Education: Goal: Ability to make informed decisions regarding treatment will improve Outcome: Progressing   Problem: Coping: Goal: Coping ability will improve Outcome: Not Progressing   Problem: Health Behavior/Discharge Planning: Goal: Identification of resources available to assist in meeting health care needs will improve Outcome: Not Progressing   Problem: Activity: Goal: Interest or engagement in leisure activities will improve Outcome: Not Progressing

## 2018-06-02 NOTE — Plan of Care (Signed)
Cooperative and compliant with treatment. Mostly in bed, sad but denying thoughts of self harm. Maintains appropriate behaviors. Appears sad and depressed. Staff continue to provide support and encouragements. Safety maintained.

## 2018-06-02 NOTE — Plan of Care (Signed)
Patient stated to this writer that he didn't know what anxiety was, patient was given education, patient said, no I am not feeling any of those feelings at the moment.   Problem: Self-Concept: Goal: Ability to identify factors that promote anxiety will improve Outcome: Not Progressing Goal: Level of anxiety will decrease Outcome: Not Progressing

## 2018-06-02 NOTE — Progress Notes (Signed)
D: Patient has flat, blunted affect; his mood is depressed.  His sleep is fair; his appetite is poor; his energy level is low and his concentration is poor.  He rates his depression as a 7; hopelessness as a 5; he denies any anxiety.  Patient reports passive suicidal thoughts without a specific plan.  Patient had a family meeting this morning with his sister and treatment team.  A: Continue to monitor medication management and MD orders.  Safety checks completed every 15 minutes per protocol.  Offer support and encouragement as needed.  R: Patient is receptive to staff; his/her behavior is appropriate.

## 2018-06-02 NOTE — Progress Notes (Signed)
[facility] MD Progress Note  06/02/2018 6:03 PM Christian [name]  MRN:  [id number] Subjective: Follow-up for this patient with depression.  Patient seen chart reviewed.  We also had a family meeting with his sister this morning.  Patient reports that his mood is a little bit better.  Still has suicidal thoughts but not as frequently.  Physically feeling a little better.  Diabetes is getting under better control.  He is not used to having insulin but the diabetes coordinator has started him on some and it seems to be helping.  No evidence of psychosis.  He is taking care of his hygiene pretty well.  Able to discuss discharge planning appropriately Principal Problem: Severe recurrent major depression without psychotic features ([place]) Diagnosis: Principal Problem:   Severe recurrent major depression without psychotic features ([place]) Active Problems:   Diabetes mellitus, type II ([place])   Suicidal ideation  Total Time spent with patient: 30 minutes  Past Psychiatric History: Past history of major depression recurrent severe and suicidal thoughts.  Past Medical History:  Past Medical History:  Diagnosis Date  . CAD (coronary artery disease)    a. 07/2013 NSTEMI/PCI: LCX 20m (4.0x23 Xience DES), RCA 52m, EF > 55%;  b. 07/2013 Echo: EF 60-65%, diast dysfxn, mild LVH, mildly dil LA, nl RV size/fxn, nl RVSP.  Marland Kitchen History of tobacco abuse    a. quit ~ 2010  . Hyperlipidemia   . Morbid obesity ([place])    a. weighed 168 @ age 64.    Past Surgical History:  Procedure Laterality Date  . ADENOIDECTOMY    . CARDIAC CATHETERIZATION  07/2013   [facility]'x1 stent   Family History:  Family History  Problem Relation Age of Onset  . Heart disease Mother   . Esophageal cancer Father    Family Psychiatric  History: None reported Social History:  Social History   Substance and Sexual Activity  Alcohol Use No     Social History   Substance and Sexual Activity  Drug Use No  . Types: Marijuana   Comment: past    Social  History   Socioeconomic History  . Marital status: Single    Spouse name: Not on file  . Number of children: Not on file  . Years of education: Not on file  . Highest education level: Not on file  Occupational History  . Not on file  Social Needs  . Financial resource strain: Not on file  . Food insecurity:    Worry: Not on file    Inability: Not on file  . Transportation needs:    Medical: Not on file    Non-medical: Not on file  Tobacco Use  . Smoking status: Former Smoker    Packs/day: 0.50    Years: 20.00    Pack years: 10.00    Types: Cigarettes  . Smokeless tobacco: Never Used  Substance and Sexual Activity  . Alcohol use: No  . Drug use: No    Types: Marijuana    Comment: past  . Sexual activity: Not on file  Lifestyle  . Physical activity:    Days per week: Not on file    Minutes per session: Not on file  . Stress: Not on file  Relationships  . Social connections:    Talks on phone: Not on file    Gets together: Not on file    Attends religious service: Not on file    Active member of club or organization: Not on file  Attends meetings of clubs or organizations: Not on file    Relationship status: Not on file  Other Topics Concern  . Not on file  Social History Narrative  . Not on file   Additional Social History:                         Sleep: Fair  Appetite:  Fair  Current Medications: Current Facility-Administered Medications  Medication Dose Route Frequency Provider Last Rate Last Dose  . acetaminophen (TYLENOL) tablet 1,000 mg  1,000 mg Oral Q6H PRN Siddalee Vanderheiden T, MD      . acetaminophen (TYLENOL) tablet 650 mg  650 mg Oral Q6H PRN Vaani Morren T, MD      . alum & mag hydroxide-simeth (MAALOX/MYLANTA) 200-200-20 MG/5ML suspension 30 mL  30 mL Oral Q4H PRN Jonasia Coiner T, MD      . aspirin chewable tablet 81 mg  81 mg Oral Daily Kaydin Karbowski, Madie Reno, MD   81 mg at 06/02/18 0757  . carvedilol (COREG) tablet 25 mg  25 mg Oral BID  Rodric Punch, Madie Reno, MD   25 mg at 06/02/18 1649  . FLUoxetine (PROZAC) capsule 20 mg  20 mg Oral QHS Pucilowska, Jolanta B, MD      . glipiZIDE (GLUCOTROL) tablet 10 mg  10 mg Oral BID Ikenna Ohms, Madie Reno, MD   10 mg at 06/02/18 1718  . hydrOXYzine (ATARAX/VISTARIL) tablet 50 mg  50 mg Oral TID PRN Obe Ahlers, Madie Reno, MD      . ibuprofen (ADVIL,MOTRIN) tablet 600 mg  600 mg Oral Q6H PRN Laylamarie Meuser, Madie Reno, MD      . Derrill Memo ON 06/03/2018] Influenza vac split quadrivalent PF (FLUARIX) injection 0.5 mL  0.5 mL Intramuscular Tomorrow-1000 Pucilowska, Jolanta B, MD      . insulin aspart (novoLOG) injection 0-5 Units  0-5 Units Subcutaneous QHS Pucilowska, Jolanta B, MD   2 Units at 06/01/18 2102  . insulin aspart (novoLOG) injection 0-9 Units  0-9 Units Subcutaneous TID WC Nylani Michetti, Madie Reno, MD   2 Units at 06/02/18 1119  . insulin glargine (LANTUS) injection 10 Units  10 Units Subcutaneous Daily Pucilowska, Jolanta B, MD   10 Units at 06/02/18 0754  . losartan (COZAAR) tablet 100 mg  100 mg Oral Daily Summers Buendia, Madie Reno, MD   100 mg at 06/01/18 0811  . magnesium hydroxide (MILK OF MAGNESIA) suspension 30 mL  30 mL Oral Daily PRN Ambriana Selway T, MD      . metFORMIN (GLUCOPHAGE) tablet 1,000 mg  1,000 mg Oral BID WC Louine Tenpenny T, MD   1,000 mg at 06/02/18 1645  . ondansetron (ZOFRAN-ODT) disintegrating tablet 4 mg  4 mg Oral Q8H PRN Pucilowska, Jolanta B, MD   4 mg at 06/02/18 1218  . [START ON 06/03/2018] pneumococcal 23 valent vaccine (PNU-IMMUNE) injection 0.5 mL  0.5 mL Intramuscular Tomorrow-1000 Pucilowska, Jolanta B, MD      . rosuvastatin (CRESTOR) tablet 10 mg  10 mg Oral Daily Ilea Hilton, Madie Reno, MD   10 mg at 06/02/18 0757  . traZODone (DESYREL) tablet 100 mg  100 mg Oral QHS PRN Orabelle Rylee, Madie Reno, MD   100 mg at 06/01/18 2102    Lab Results:  Results for orders placed or performed during the hospital encounter of 05/29/18 (from the past 48 hour(s))  Glucose, capillary     Status: Abnormal   Collection Time:  05/31/18  8:30 PM  Result Value Ref  Range   Glucose-Capillary 224 (H) 70 - 99 mg/dL   Comment 1 Notify RN   Glucose, capillary     Status: Abnormal   Collection Time: 06/01/18  7:09 AM  Result Value Ref Range   Glucose-Capillary 210 (H) 70 - 99 mg/dL   Comment 1 Notify RN   Glucose, capillary     Status: Abnormal   Collection Time: 06/01/18 11:09 AM  Result Value Ref Range   Glucose-Capillary 308 (H) 70 - 99 mg/dL   Comment 1 Notify RN   Glucose, capillary     Status: Abnormal   Collection Time: 06/01/18  4:12 PM  Result Value Ref Range   Glucose-Capillary 175 (H) 70 - 99 mg/dL   Comment 1 Notify RN   Glucose, capillary     Status: Abnormal   Collection Time: 06/01/18  8:59 PM  Result Value Ref Range   Glucose-Capillary 231 (H) 70 - 99 mg/dL  Glucose, capillary     Status: Abnormal   Collection Time: 06/02/18  7:07 AM  Result Value Ref Range   Glucose-Capillary 146 (H) 70 - 99 mg/dL   Comment 1 Notify RN   Glucose, capillary     Status: Abnormal   Collection Time: 06/02/18 11:14 AM  Result Value Ref Range   Glucose-Capillary 189 (H) 70 - 99 mg/dL  Glucose, capillary     Status: Abnormal   Collection Time: 06/02/18  4:44 PM  Result Value Ref Range   Glucose-Capillary 120 (H) 70 - 99 mg/dL    Blood Alcohol level:  Lab Results  Component Value Date   ETH 46 (H) 19/41/7408    Metabolic Disorder Labs: Lab Results  Component Value Date   HGBA1C 9.2 (H) 05/30/2018   MPG 217.34 05/30/2018   MPG 228.82 05/29/2018   No results found for: PROLACTIN Lab Results  Component Value Date   CHOL 274 (H) 05/30/2018   TRIG 665 (H) 05/30/2018   HDL 37 (L) 05/30/2018   CHOLHDL 7.4 05/30/2018   VLDL UNABLE TO CALCULATE IF TRIGLYCERIDE OVER 400 mg/dL 05/30/2018   LDLCALC UNABLE TO CALCULATE IF TRIGLYCERIDE OVER 400 mg/dL 05/30/2018   LDLCALC UNABLE TO CALCULATE IF TRIGLYCERIDE OVER 400 mg/dL 12/31/2017    Physical Findings: AIMS: Facial and Oral Movements Muscles of Facial  Expression: None, normal Lips and Perioral Area: None, normal Jaw: None, normal Tongue: None, normal,Extremity Movements Upper (arms, wrists, hands, fingers): None, normal Lower (legs, knees, ankles, toes): None, normal, Trunk Movements Neck, shoulders, hips: None, normal, Overall Severity Severity of abnormal movements (highest score from questions above): None, normal Incapacitation due to abnormal movements: None, normal Patient's awareness of abnormal movements (rate only patient's report): No Awareness, Dental Status Current problems with teeth and/or dentures?: No Does patient usually wear dentures?: No  CIWA:  CIWA-Ar Total: 3 COWS:  COWS Total Score: 3  Musculoskeletal: Strength & Muscle Tone: within normal limits Gait & Station: normal Patient leans: N/A  Psychiatric Specialty Exam: Physical Exam  Nursing note and vitals reviewed. Constitutional: He appears well-developed and well-nourished.  HENT:  Head: Normocephalic and atraumatic.  Eyes: Pupils are equal, round, and reactive to light. Conjunctivae are normal.  Neck: Normal range of motion.  Cardiovascular: Regular rhythm and normal heart sounds.  Respiratory: Effort normal. No respiratory distress.  GI: Soft.  Musculoskeletal: Normal range of motion.  Neurological: He is alert.  Skin: Skin is warm and dry.  Psychiatric: Judgment normal. His affect is blunt. His speech is delayed. He is slowed. Cognition  and memory are normal. He expresses suicidal ideation. He expresses no suicidal plans.    Review of Systems  Constitutional: Negative.   HENT: Negative.   Eyes: Negative.   Respiratory: Negative.   Cardiovascular: Negative.   Gastrointestinal: Negative.   Musculoskeletal: Negative.   Skin: Negative.   Neurological: Negative.   Psychiatric/Behavioral: Positive for depression and suicidal ideas. Negative for hallucinations, memory loss and substance abuse. The patient is nervous/anxious and has insomnia.      Blood pressure 114/73, pulse 82, temperature 98.6 F (37 C), temperature source Oral, resp. rate 18, height 5\' 6"  (1.676 m), weight 78.5 kg, SpO2 100 %.Body mass index is 27.92 kg/m.  General Appearance: Fairly Groomed  Eye Contact:  Good  Speech:  Clear and Coherent  Volume:  Normal  Mood:  Euthymic  Affect:  Constricted  Thought Process:  Goal Directed  Orientation:  Full (Time, Place, and Person)  Thought Content:  Logical  Suicidal Thoughts:  Yes.  without intent/plan  Homicidal Thoughts:  No  Memory:  Immediate;   Fair Recent;   Fair Remote;   Fair  Judgement:  Fair  Insight:  Fair  Psychomotor Activity:  Normal  Concentration:  Concentration: Fair  Recall:  [employer] of Knowledge:  Fair  Language:  Fair  Akathisia:  No  Handed:  Right  AIMS (if indicated):     Assets:  Communication Skills Desire for Improvement Housing Resilience Social Support  ADL's:  Intact  Cognition:  WNL  Sleep:  Number of Hours: 7.25     Treatment Plan Summary: Daily contact with patient to assess and evaluate symptoms and progress in treatment, Medication management and Plan Patient appears to be doing much better.  Denies any acute suicidal intent although he still feels down and depressed at times.  Able to discuss discharge planning appropriately.  Blood sugars getting under better control.  We anticipate likely discharge by Thursday.  Alethia Berthold, MD 06/02/2018, 6:03 PM

## 2018-06-03 LAB — GLUCOSE, CAPILLARY
GLUCOSE-CAPILLARY: 157 mg/dL — AB (ref 70–99)
Glucose-Capillary: 106 mg/dL — ABNORMAL HIGH (ref 70–99)
Glucose-Capillary: 78 mg/dL (ref 70–99)

## 2018-06-03 MED ORDER — TRAZODONE HCL 100 MG PO TABS: 100.0000 mg | ORAL_TABLET | Freq: Every evening | ORAL | 0 refills | Status: DC | PRN

## 2018-06-03 MED ORDER — METFORMIN HCL 1000 MG PO TABS: 1000.0000 mg | ORAL_TABLET | Freq: Two times a day (BID) | ORAL | 0 refills | Status: DC

## 2018-06-03 MED ORDER — CARVEDILOL 25 MG PO TABS: 25.0000 mg | ORAL_TABLET | Freq: Two times a day (BID) | ORAL | 0 refills | Status: DC

## 2018-06-03 MED ORDER — FLUOXETINE HCL 20 MG PO CAPS: 20.0000 mg | ORAL_CAPSULE | Freq: Every day | ORAL | 0 refills | Status: DC

## 2018-06-03 MED ORDER — ASPIRIN 81 MG PO CHEW: 81.0000 mg | CHEWABLE_TABLET | Freq: Every day | ORAL | 0 refills | Status: DC

## 2018-06-03 MED ORDER — ROSUVASTATIN CALCIUM 10 MG PO TABS: 10.0000 mg | ORAL_TABLET | Freq: Every day | ORAL | 0 refills | Status: DC

## 2018-06-03 MED ORDER — INSULIN GLARGINE 100 UNIT/ML ~~LOC~~ SOLN: 10.0000 [IU] | Freq: Every day | SUBCUTANEOUS | 0 refills | Status: DC

## 2018-06-03 MED ORDER — HYDROXYZINE HCL 50 MG PO TABS: 50.0000 mg | ORAL_TABLET | Freq: Three times a day (TID) | ORAL | 0 refills | Status: DC | PRN

## 2018-06-03 MED ORDER — GLIPIZIDE 10 MG PO TABS: 10.0000 mg | ORAL_TABLET | Freq: Two times a day (BID) | ORAL | 0 refills | Status: DC

## 2018-06-03 MED ORDER — LOSARTAN POTASSIUM 100 MG PO TABS: 100.0000 mg | ORAL_TABLET | Freq: Every day | ORAL | 1 refills | Status: DC

## 2018-06-03 NOTE — B[facility] Suicide Risk Assessment (Signed)
[facility] Discharge Suicide Risk Assessment   Principal Problem: Severe recurrent major depression without psychotic features [facility]) Discharge Diagnoses: Principal Problem:   Severe recurrent major depression without psychotic features ([place]) Active Problems:   Diabetes mellitus, type II ([place])   Suicidal ideation   Total Time spent with patient: 45 minutes  Musculoskeletal: Strength & Muscle Tone: within normal limits Gait & Station: normal Patient leans: N/A  Psychiatric Specialty Exam: Review of Systems  Constitutional: Negative.   HENT: Negative.   Eyes: Negative.   Respiratory: Negative.   Cardiovascular: Negative.   Gastrointestinal: Negative.   Musculoskeletal: Negative.   Skin: Negative.   Neurological: Negative.   Psychiatric/Behavioral: Negative.     Blood pressure 98/66, pulse 81, temperature 98.6 F (37 C), temperature source Oral, resp. rate 18, height 5\' 6"  (1.676 m), weight 78.5 kg, SpO2 99 %.Body mass index is 27.92 kg/m.  General Appearance: Fairly Groomed  Engineer, water::  Fair  Speech:  [phone]  Volume:  Decreased  Mood:  Euthymic  Affect:  Congruent  Thought Process:  Goal Directed  Orientation:  Full (Time, Place, and Person)  Thought Content:  Logical  Suicidal Thoughts:  No  Homicidal Thoughts:  No  Memory:  Immediate;   Fair Recent;   Fair Remote;   Fair  Judgement:  Fair  Insight:  Fair  Psychomotor Activity:  Decreased  Concentration:  Fair  Recall:  [employer] of Knowledge:Fair  Language: Fair  Akathisia:  No  Handed:  Right  AIMS (if indicated):     Assets:  Desire for Improvement Resilience Social Support  Sleep:  Number of Hours: 8.15  Cognition: WNL  ADL's:  Intact   Mental Status Per Nursing Assessment::   On Admission:  Suicidal ideation indicated by patient  Demographic Factors:  Male and Living alone  Loss Factors: Financial problems/change in socioeconomic status  Historical Factors: Impulsivity  Risk Reduction  Factors:   Sense of responsibility to family, Positive social support and Positive therapeutic relationship  Continued Clinical Symptoms:  Depression:   Anhedonia  Cognitive Features That Contribute To Risk:  Loss of executive function    Suicide Risk:  Minimal: No identifiable suicidal ideation.  Patients presenting with no risk factors but with morbid ruminations; may be classified as minimal risk based on the severity of the depressive symptoms  Follow-up Information    [facility] Follow up.   Why:  Please meet Christian [name] on 06/05/2018 at 7:15AM. Contact information: [place] [address] [phone]           Plan Of Care/Follow-up recommendations:  Activity:  Activity as tolerated Diet:  Regular diet Other:  Follow-up with outpatient treatment at [place], MD 06/03/2018, 4:03 PM

## 2018-06-03 NOTE — Progress Notes (Signed)
Recreation Therapy Notes    Date: 06/03/2018  Time: 9:30 am   Location: Craft room   Behavioral response: N/A   Intervention Topic: Self-esteem  Discussion/Intervention: Patient did not attend group.   Clinical Observations/Feedback:  Patient did not attend group.   Hillery Zachman LRT/CTRS         Josalynn Johndrow 06/03/2018 10:42 AM

## 2018-06-03 NOTE — Plan of Care (Signed)
D: Patient remains depressed and sad; he has feelings of hopelessness.  He is interacting more today.  Patient continues to complain of some nausea and poor appetite.  Patient states, "I really need to call my mother today.  I've been putting it off."  He denies any active thoughts of self harm.  Patient has sad, flat affect; depressed mood.    A: Continue to monitor medication management and MD orders.  Safety checks completed every 15 minutes per protocol.  Offer support and encouragement as needed.  R: Patient is receptive to staff; his behavior is appropriate.    Problem: Education: Goal: Ability to make informed decisions regarding treatment will improve 06/03/2018 1308 by Joice Lofts, RN Outcome: Progressing 06/03/2018 1115 by Joice Lofts, RN Outcome: Progressing   Problem: Coping: Goal: Coping ability will improve 06/03/2018 1308 by Joice Lofts, RN Outcome: Progressing 06/03/2018 1115 by Joice Lofts, RN Outcome: Not Progressing   Problem: Health Behavior/Discharge Planning: Goal: Identification of resources available to assist in meeting health care needs will improve 06/03/2018 1308 by Joice Lofts, RN Outcome: Progressing 06/03/2018 1115 by Joice Lofts, RN Outcome: Progressing   Problem: Activity: Goal: Interest or engagement in leisure activities will improve Outcome: Progressing Goal: Imbalance in normal sleep/wake cycle will improve Outcome: Progressing   Problem: Coping: Goal: Will verbalize feelings Outcome: Progressing

## 2018-06-03 NOTE — Progress Notes (Signed)
[facility] MD Progress Note  06/03/2018 3:54 PM Christian [name]  MRN:  [id number] Subjective: Follow-up for this patient with severe depression.  Patient seen chart reviewed.  Patient reports his mood is improved today.  Not having any suicidal thoughts.  Feels more hopeful about the future.  He does complain about some nausea in the morning.  We discussed again how it could be related to his medication.  Not having any psychotic symptoms.  Feels comfortable with the plan for discharge tomorrow. Principal Problem: Severe recurrent major depression without psychotic features ([place]) Diagnosis: Principal Problem:   Severe recurrent major depression without psychotic features ([place]) Active Problems:   Diabetes mellitus, type II ([place])   Suicidal ideation  Total Time spent with patient: 30 minutes  Past Psychiatric History: Patient has history of depression.  Past Medical History:  Past Medical History:  Diagnosis Date  . CAD (coronary artery disease)    a. 07/2013 NSTEMI/PCI: LCX 49m (4.0x23 Xience DES), RCA 69m, EF > 55%;  b. 07/2013 Echo: EF 60-65%, diast dysfxn, mild LVH, mildly dil LA, nl RV size/fxn, nl RVSP.  Marland Kitchen History of tobacco abuse    a. quit ~ 2010  . Hyperlipidemia   . Morbid obesity ([place])    a. weighed 168 @ age 57.    Past Surgical History:  Procedure Laterality Date  . ADENOIDECTOMY    . CARDIAC CATHETERIZATION  07/2013   [facility]'x1 stent   Family History:  Family History  Problem Relation Age of Onset  . Heart disease Mother   . Esophageal cancer Father    Family Psychiatric  History: See previous Social History:  Social History   Substance and Sexual Activity  Alcohol Use No     Social History   Substance and Sexual Activity  Drug Use No  . Types: Marijuana   Comment: past    Social History   Socioeconomic History  . Marital status: Single    Spouse name: Not on file  . Number of children: Not on file  . Years of education: Not on file  . Highest education level:  Not on file  Occupational History  . Not on file  Social Needs  . Financial resource strain: Not on file  . Food insecurity:    Worry: Not on file    Inability: Not on file  . Transportation needs:    Medical: Not on file    Non-medical: Not on file  Tobacco Use  . Smoking status: Former Smoker    Packs/day: 0.50    Years: 20.00    Pack years: 10.00    Types: Cigarettes  . Smokeless tobacco: Never Used  Substance and Sexual Activity  . Alcohol use: No  . Drug use: No    Types: Marijuana    Comment: past  . Sexual activity: Not on file  Lifestyle  . Physical activity:    Days per week: Not on file    Minutes per session: Not on file  . Stress: Not on file  Relationships  . Social connections:    Talks on phone: Not on file    Gets together: Not on file    Attends religious service: Not on file    Active member of club or organization: Not on file    Attends meetings of clubs or organizations: Not on file    Relationship status: Not on file  Other Topics Concern  . Not on file  Social History Narrative  . Not on file  Additional Social History:                         Sleep: Fair  Appetite:  Fair  Current Medications: Current Facility-Administered Medications  Medication Dose Route Frequency Provider Last Rate Last Dose  . acetaminophen (TYLENOL) tablet 1,000 mg  1,000 mg Oral Q6H PRN Joeli Fenner T, MD      . acetaminophen (TYLENOL) tablet 650 mg  650 mg Oral Q6H PRN Gena Laski T, MD      . alum & mag hydroxide-simeth (MAALOX/MYLANTA) 200-200-20 MG/5ML suspension 30 mL  30 mL Oral Q4H PRN Chiquitta Matty T, MD      . aspirin chewable tablet 81 mg  81 mg Oral Daily Jeree Delcid, Madie Reno, MD   81 mg at 06/03/18 0754  . carvedilol (COREG) tablet 25 mg  25 mg Oral BID Keith Felten, Madie Reno, MD   25 mg at 06/03/18 0754  . FLUoxetine (PROZAC) capsule 20 mg  20 mg Oral QHS Pucilowska, Jolanta B, MD   20 mg at 06/02/18 2151  . glipiZIDE (GLUCOTROL) tablet 10 mg  10  mg Oral BID Bianca Raneri, Madie Reno, MD   10 mg at 06/03/18 0754  . hydrOXYzine (ATARAX/VISTARIL) tablet 50 mg  50 mg Oral TID PRN Dannilynn Gallina T, MD      . ibuprofen (ADVIL,MOTRIN) tablet 600 mg  600 mg Oral Q6H PRN Maxamillian Tienda T, MD      . Influenza vac split quadrivalent PF (FLUARIX) injection 0.5 mL  0.5 mL Intramuscular Tomorrow-1000 Pucilowska, Jolanta B, MD      . insulin aspart (novoLOG) injection 0-5 Units  0-5 Units Subcutaneous QHS Pucilowska, Jolanta B, MD   2 Units at 06/01/18 2102  . insulin aspart (novoLOG) injection 0-9 Units  0-9 Units Subcutaneous TID WC Stanely Sexson, Madie Reno, MD   2 Units at 06/03/18 1126  . insulin glargine (LANTUS) injection 10 Units  10 Units Subcutaneous Daily Pucilowska, Jolanta B, MD   10 Units at 06/03/18 0753  . losartan (COZAAR) tablet 100 mg  100 mg Oral Daily Dace Denn, Madie Reno, MD   100 mg at 06/03/18 0755  . magnesium hydroxide (MILK OF MAGNESIA) suspension 30 mL  30 mL Oral Daily PRN Lowry Bala T, MD      . metFORMIN (GLUCOPHAGE) tablet 1,000 mg  1,000 mg Oral BID WC Trevyon Swor, Madie Reno, MD   1,000 mg at 06/03/18 0754  . ondansetron (ZOFRAN-ODT) disintegrating tablet 4 mg  4 mg Oral Q8H PRN Pucilowska, Jolanta B, MD   4 mg at 06/03/18 0804  . pneumococcal 23 valent vaccine (PNU-IMMUNE) injection 0.5 mL  0.5 mL Intramuscular Tomorrow-1000 Pucilowska, Jolanta B, MD      . rosuvastatin (CRESTOR) tablet 10 mg  10 mg Oral Daily Ivalene Platte, Madie Reno, MD   10 mg at 06/03/18 0754  . traZODone (DESYREL) tablet 100 mg  100 mg Oral QHS PRN Genecis Veley, Madie Reno, MD   100 mg at 06/01/18 2102    Lab Results:  Results for orders placed or performed during the hospital encounter of 05/29/18 (from the past 48 hour(s))  Glucose, capillary     Status: Abnormal   Collection Time: 06/01/18  4:12 PM  Result Value Ref Range   Glucose-Capillary 175 (H) 70 - 99 mg/dL   Comment 1 Notify RN   Glucose, capillary     Status: Abnormal   Collection Time: 06/01/18  8:59 PM  Result Value Ref Range  Glucose-Capillary 231 (H) 70 - 99 mg/dL  Glucose, capillary     Status: Abnormal   Collection Time: 06/02/18  7:07 AM  Result Value Ref Range   Glucose-Capillary 146 (H) 70 - 99 mg/dL   Comment 1 Notify RN   Glucose, capillary     Status: Abnormal   Collection Time: 06/02/18 11:14 AM  Result Value Ref Range   Glucose-Capillary 189 (H) 70 - 99 mg/dL  Glucose, capillary     Status: Abnormal   Collection Time: 06/02/18  4:44 PM  Result Value Ref Range   Glucose-Capillary 120 (H) 70 - 99 mg/dL  Glucose, capillary     Status: Abnormal   Collection Time: 06/02/18  8:19 PM  Result Value Ref Range   Glucose-Capillary 131 (H) 70 - 99 mg/dL   Comment 1 Notify RN   Glucose, capillary     Status: Abnormal   Collection Time: 06/03/18  7:16 AM  Result Value Ref Range   Glucose-Capillary 106 (H) 70 - 99 mg/dL   Comment 1 Notify RN   Glucose, capillary     Status: Abnormal   Collection Time: 06/03/18 11:22 AM  Result Value Ref Range   Glucose-Capillary 157 (H) 70 - 99 mg/dL    Blood Alcohol level:  Lab Results  Component Value Date   ETH 46 (H) 01/77/9390    Metabolic Disorder Labs: Lab Results  Component Value Date   HGBA1C 9.2 (H) 05/30/2018   MPG 217.34 05/30/2018   MPG 228.82 05/29/2018   No results found for: PROLACTIN Lab Results  Component Value Date   CHOL 274 (H) 05/30/2018   TRIG 665 (H) 05/30/2018   HDL 37 (L) 05/30/2018   CHOLHDL 7.4 05/30/2018   VLDL UNABLE TO CALCULATE IF TRIGLYCERIDE OVER 400 mg/dL 05/30/2018   LDLCALC UNABLE TO CALCULATE IF TRIGLYCERIDE OVER 400 mg/dL 05/30/2018   LDLCALC UNABLE TO CALCULATE IF TRIGLYCERIDE OVER 400 mg/dL 12/31/2017    Physical Findings: AIMS: Facial and Oral Movements Muscles of Facial Expression: None, normal Lips and Perioral Area: None, normal Jaw: None, normal Tongue: None, normal,Extremity Movements Upper (arms, wrists, hands, fingers): None, normal Lower (legs, knees, ankles, toes): None, normal, Trunk  Movements Neck, shoulders, hips: None, normal, Overall Severity Severity of abnormal movements (highest score from questions above): None, normal Incapacitation due to abnormal movements: None, normal Patient's awareness of abnormal movements (rate only patient's report): No Awareness, Dental Status Current problems with teeth and/or dentures?: No Does patient usually wear dentures?: No  CIWA:  CIWA-Ar Total: 3 COWS:  COWS Total Score: 3  Musculoskeletal: Strength & Muscle Tone: within normal limits Gait & Station: normal Patient leans: N/A  Psychiatric Specialty Exam: Physical Exam  Nursing note and vitals reviewed. Constitutional: He appears well-developed and well-nourished.  HENT:  Head: Normocephalic and atraumatic.  Eyes: Pupils are equal, round, and reactive to light. Conjunctivae are normal.  Neck: Normal range of motion.  Cardiovascular: Regular rhythm and normal heart sounds.  Respiratory: Effort normal. No respiratory distress.  GI: Soft.  Musculoskeletal: Normal range of motion.  Neurological: He is alert.  Skin: Skin is warm and dry.  Psychiatric: Judgment normal. His affect is blunt. His speech is delayed. He is slowed. Thought content is not paranoid. Cognition and memory are normal. He expresses no homicidal and no suicidal ideation.    Review of Systems  Constitutional: Negative.   HENT: Negative.   Eyes: Negative.   Respiratory: Negative.   Cardiovascular: Negative.   Gastrointestinal: Positive for nausea.  Musculoskeletal: Negative.   Skin: Negative.   Neurological: Negative.   Psychiatric/Behavioral: Negative for depression, hallucinations, memory loss, substance abuse and suicidal ideas. The patient is nervous/anxious. The patient does not have insomnia.     Blood pressure 98/66, pulse 81, temperature 98.6 F (37 C), temperature source Oral, resp. rate 18, height 5\' 6"  (1.676 m), weight 78.5 kg, SpO2 99 %.Body mass index is 27.92 kg/m.  General  Appearance: Fairly Groomed  Eye Contact:  Good  Speech:  Slow  Volume:  Decreased  Mood:  Dysphoric  Affect:  Constricted  Thought Process:  Coherent  Orientation:  Full (Time, Place, and Person)  Thought Content:  Logical  Suicidal Thoughts:  No  Homicidal Thoughts:  No  Memory:  Immediate;   Fair Recent;   Fair Remote;   Fair  Judgement:  Fair  Insight:  Fair  Psychomotor Activity:  Normal  Concentration:  Concentration: Fair  Recall:  [employer] of Knowledge:  Fair  Language:  Fair  Akathisia:  No  Handed:  Right  AIMS (if indicated):     Assets:  Communication Skills Desire for Improvement Housing Physical Health Resilience Social Support  ADL's:  Intact  Cognition:  WNL  Sleep:  Number of Hours: 8.15     Treatment Plan Summary: Daily contact with patient to assess and evaluate symptoms and progress in treatment, Medication management and Plan We discussed options for medication management including the possibility of changing antidepressant.  Patient is agreeable to staying with the medicine now to see if the side effects improved.  He is feeling significantly better with his mood.  Plan is for discharge likely tomorrow with follow-up to be arranged with outpatient providers in the community.  Alethia Berthold, MD 06/03/2018, 3:54 PM

## 2018-06-03 NOTE — [facility] Group Notes (Signed)
LCSW Group Therapy Note  06/03/2018 1:41 PM  Type of Therapy/Topic:  Group Therapy:  Emotion Regulation  Participation Level:  Active   Description of Group:   The purpose of this group is to assist patients in learning to regulate negative emotions and experience positive emotions. Patients will be guided to discuss ways in which they have been vulnerable to their negative emotions. These vulnerabilities will be juxtaposed with experiences of positive emotions or situations, and patients will be challenged to use positive emotions to combat negative ones. Special emphasis will be placed on coping with negative emotions in conflict situations, and patients will process healthy conflict resolution skills.  Therapeutic Goals: 1. Patient will identify two positive emotions or experiences to reflect on in order to balance out negative emotions 2. Patient will label two or more emotions that they find the most difficult to experience 3. Patient will demonstrate positive conflict resolution skills through discussion and/or role plays  Summary of Patient Progress: Pt was respectful and appropriate in group. Pt came in a little late, but quickly acclimated to the group. Pt reported that he was feeling overwhelmed prior to coming to the hospital, but since being at the hospital he has been feeling a little better.   Therapeutic Modalities:   Cognitive Behavioral Therapy Feelings Identification Dialectical Behavioral Therapy   Evalina Field, MSW, LCSW Clinical Social Work 06/03/2018 1:41 PM

## 2018-06-04 ENCOUNTER — Other Ambulatory Visit: Payer: Self-pay | Admitting: Psychiatry

## 2018-06-04 LAB — GLUCOSE, CAPILLARY
Glucose-Capillary: 92 mg/dL (ref 70–99)
Glucose-Capillary: 85 mg/dL (ref 70–99)

## 2018-06-04 MED ORDER — TRAZODONE HCL 100 MG PO TABS: 100.0000 mg | ORAL_TABLET | Freq: Every evening | ORAL | 1 refills | Status: DC | PRN

## 2018-06-04 MED ORDER — GLIPIZIDE 10 MG PO TABS: 10.0000 mg | ORAL_TABLET | Freq: Two times a day (BID) | ORAL | 1 refills | Status: DC

## 2018-06-04 MED ORDER — HYDROXYZINE HCL 50 MG PO TABS: 50.0000 mg | ORAL_TABLET | Freq: Three times a day (TID) | ORAL | 1 refills | Status: DC | PRN

## 2018-06-04 MED ORDER — METFORMIN HCL 1000 MG PO TABS: 1000.0000 mg | ORAL_TABLET | Freq: Two times a day (BID) | ORAL | 1 refills | Status: DC

## 2018-06-04 MED ORDER — ROSUVASTATIN CALCIUM 10 MG PO TABS: 10.0000 mg | ORAL_TABLET | Freq: Every day | ORAL | 0 refills | Status: DC

## 2018-06-04 MED ORDER — INSULIN GLARGINE 100 UNIT/ML ~~LOC~~ SOLN: 10.0000 [IU] | Freq: Every day | SUBCUTANEOUS | 1 refills | Status: DC

## 2018-06-04 MED ORDER — FLUOXETINE HCL 20 MG PO CAPS: 20.0000 mg | ORAL_CAPSULE | Freq: Every day | ORAL | 1 refills | Status: DC

## 2018-06-04 MED ORDER — CARVEDILOL 25 MG PO TABS: 25.0000 mg | ORAL_TABLET | Freq: Two times a day (BID) | ORAL | 1 refills | Status: DC

## 2018-06-04 MED ORDER — ASPIRIN 81 MG PO CHEW: 81.0000 mg | CHEWABLE_TABLET | Freq: Every day | ORAL | 1 refills | Status: DC

## 2018-06-04 MED ORDER — LOSARTAN POTASSIUM 100 MG PO TABS: 100.0000 mg | ORAL_TABLET | Freq: Every day | ORAL | 1 refills | Status: DC

## 2018-06-04 NOTE — Plan of Care (Signed)
Patient stated that he has not been experiencing any anxiety the last few days and that he is feeling pretty good.    Problem: Self-Concept: Goal: Ability to identify factors that promote anxiety will improve Outcome: Progressing Goal: Level of anxiety will decrease Outcome: Progressing

## 2018-06-04 NOTE — Progress Notes (Signed)
D - Patient was in his room upon arrival to the unit. Patient was pleasant during assessment and medication administration. Patient denies SI/HI/AVH, pain, anxiety and depression. Patient did present with a depressed affect though. Patient stated he hasn't had much of an apatite lately but did eat some crackers this evening at snack time.   A - Patient was compliant with medication administration per MD orders and procedures on the unit. Patient given education. Patient given support and encouragement to be active in his treatment plan. Patient informed to let staff know if there are any issues or problems on the unit.   R - Patient being monitored Q 15 minutes for safety per unit protocol. Patient remains safe on the unit at this time.

## 2018-06-04 NOTE — Progress Notes (Signed)
  [facility] Adult Case Management Discharge Plan :  Will you be returning to the same living situation after discharge:  Yes,  pt is returning home.  At discharge, do you have transportation home?: Yes,  pt reports that his car is at the hospital.  Do you have the ability to pay for your medications: Yes,  CSW completed medication application with the patient.   Release of information consent forms completed and in the chart;  Patient's signature needed at discharge.  Patient to Follow up at: Follow-up Information    [facility] Follow up.   Why:  Please meet Harvey on 06/05/2018 at 7:15AM. Contact information: [place] [address] [phone]           Next level of care provider has access to [place] and Suicide Prevention discussed: Yes,  SPE completed with the patient's sister.  Have you used any form of tobacco in the last 30 days? (Cigarettes, Smokeless Tobacco, Cigars, and/or Pipes): No  Has patient been referred to the Quitline?: N/A patient is not a smoker  Patient has been referred for addiction treatment: [place], LCSW 06/04/2018, 10:20 AM

## 2018-06-04 NOTE — Progress Notes (Signed)
Recreation Therapy Notes  INPATIENT RECREATION TR PLAN  Patient Details Name: Christian [name] MRN: [id number] DOB: 1971/01/24 Today's Date: 06/04/2018  Rec Therapy Plan Is patient appropriate for Therapeutic Recreation?: Yes Treatment times per week: at least 3 Estimated Length of Stay: 5-7 days TR Treatment/Interventions: Group participation (Comment)  Discharge Criteria Pt will be discharged from therapy if:: Discharged Treatment plan/goals/alternatives discussed and agreed upon by:: Patient/family  Discharge Summary Short term goals set: Patient will engage in groups without prompting or encouragement from LRT x3 group sessions within 5 recreation therapy group sessions Short term goals met: Not met Progress toward goals comments: Groups attended Which groups?: Other (Comment)(Strengths) Reason goals not met: N/A Therapeutic equipment acquired: N/A Reason patient discharged from therapy: Discharge from hospital Pt/family agrees with progress & goals achieved: Yes Date patient discharged from therapy: 06/04/18   [name] 06/04/2018, 11:20 AM

## 2018-06-04 NOTE — Progress Notes (Signed)
Recreation Therapy Notes   Date:06/04/2018  Time:9:30 am  Location:C[facility] room  Behavioral response:N/A  Intervention Topic:Goals  Discussion/Intervention: Patient did not attend group.  Clinical Observations/Feedback:  Patient did not attend group.  Fahed Morten LRT/CTRS          Lachrisha Ziebarth 06/04/2018 10:20 AM

## 2018-06-04 NOTE — Discharge Summary (Signed)
Physician Discharge Summary Note  Patient:  Christian [name] is an 48 y.o., male MRN:  [id number] DOB:  [birth year] Patient phone:  [phone] (home)  Patient address:   [address] [place] [address],  Total Time spent with patient: 45 minutes  Date of Admission:  05/29/2018 Date of Discharge: June 04, 2018  Reason for Admission: Patient was admitted because of severe depression with suicidal ideation.  No outpatient treatment.  Recent alcohol abuse.  Principal Problem: Severe recurrent major depression without psychotic features [facility]) Discharge Diagnoses: Principal Problem:   Severe recurrent major depression without psychotic features ([place]) Active Problems:   Diabetes mellitus, type II ([place])   Suicidal ideation   Past Psychiatric History: Patient has a history of alcohol abuse and a history of depressive symptoms although he had not received appropriate prior treatment.  Never been in a hospital and apparently never on medication in the past.  Past Medical History:  Past Medical History:  Diagnosis Date  . CAD (coronary artery disease)    a. 07/2013 NSTEMI/PCI: LCX 4m (4.0x23 Xience DES), RCA 21m, EF > 55%;  b. 07/2013 Echo: EF 60-65%, diast dysfxn, mild LVH, mildly dil LA, nl RV size/fxn, nl RVSP.  Marland Kitchen History of tobacco abuse    a. quit ~ 2010  . Hyperlipidemia   . Morbid obesity ([place])    a. weighed 168 @ age 16.    Past Surgical History:  Procedure Laterality Date  . ADENOIDECTOMY    . CARDIAC CATHETERIZATION  07/2013   [facility]'x1 stent   Family History:  Family History  Problem Relation Age of Onset  . Heart disease Mother   . Esophageal cancer Father    Family Psychiatric  History: See previous notes Social History:  Social History   Substance and Sexual Activity  Alcohol Use No     Social History   Substance and Sexual Activity  Drug Use No  . Types: Marijuana   Comment: past    Social History   Socioeconomic History  . Marital status: Single   Spouse name: Not on file  . Number of children: Not on file  . Years of education: Not on file  . Highest education level: Not on file  Occupational History  . Not on file  Social Needs  . Financial resource strain: Not on file  . Food insecurity:    Worry: Not on file    Inability: Not on file  . Transportation needs:    Medical: Not on file    Non-medical: Not on file  Tobacco Use  . Smoking status: Former Smoker    Packs/day: 0.50    Years: 20.00    Pack years: 10.00    Types: Cigarettes  . Smokeless tobacco: Never Used  Substance and Sexual Activity  . Alcohol use: No  . Drug use: No    Types: Marijuana    Comment: past  . Sexual activity: Not on file  Lifestyle  . Physical activity:    Days per week: Not on file    Minutes per session: Not on file  . Stress: Not on file  Relationships  . Social connections:    Talks on phone: Not on file    Gets together: Not on file    Attends religious service: Not on file    Active member of club or organization: Not on file    Attends meetings of clubs or organizations: Not on file    Relationship status: Not on file  Other Topics Concern  . Not on file  Social History Narrative  . Not on file    Hospital Course: Patient was admitted to the psychiatric ward on 15-minute checks.  Patient did not show any dangerous or aggressive behavior while in the hospital.  Did not act to harm himself.  He was cooperative with appropriate treatment.  Attended groups and attended individual and group therapy and assessments.  Patient started on fluoxetine.  Complained of mild nausea for 1 or 2 days but prefers to continue medication with the hope that possible side effects were improved.  Patient did not show any major signs of detox.  No seizures no DTs.  By the time of discharge patient was free of any suicidal ideation.  Affect brighter.  He has been referred for appropriate outpatient treatment in the community and is agreeable.  Patient's  diabetes was found to be under worse control than he had thought and he has been started on Lantus at night to complement his oral medications.  Physical Findings: AIMS: Facial and Oral Movements Muscles of Facial Expression: None, normal Lips and Perioral Area: None, normal Jaw: None, normal Tongue: None, normal,Extremity Movements Upper (arms, wrists, hands, fingers): None, normal Lower (legs, knees, ankles, toes): None, normal, Trunk Movements Neck, shoulders, hips: None, normal, Overall Severity Severity of abnormal movements (highest score from questions above): None, normal Incapacitation due to abnormal movements: None, normal Patient's awareness of abnormal movements (rate only patient's report): No Awareness, Dental Status Current problems with teeth and/or dentures?: No Does patient usually wear dentures?: No  CIWA:  CIWA-Ar Total: 3 COWS:  COWS Total Score: 3  Musculoskeletal: Strength & Muscle Tone: within normal limits Gait & Station: normal Patient leans: N/A  Psychiatric Specialty Exam: Physical Exam  Nursing note and vitals reviewed. Constitutional: He appears well-developed and well-nourished.  HENT:  Head: Normocephalic and atraumatic.  Eyes: Pupils are equal, round, and reactive to light. Conjunctivae are normal.  Neck: Normal range of motion.  Cardiovascular: Regular rhythm and normal heart sounds.  Respiratory: Effort normal. No respiratory distress.  GI: Soft.  Musculoskeletal: Normal range of motion.  Neurological: He is alert.  Skin: Skin is warm and dry.  Psychiatric: He has a normal mood and affect. His speech is normal and behavior is normal. Judgment and thought content normal. Cognition and memory are normal.    Review of Systems  Constitutional: Negative.   HENT: Negative.   Eyes: Negative.   Respiratory: Negative.   Cardiovascular: Negative.   Gastrointestinal: Negative.   Musculoskeletal: Negative.   Skin: Negative.   Neurological:  Negative.   Psychiatric/Behavioral: Negative.     Blood pressure 99/75, pulse 96, temperature 98.3 F (36.8 C), temperature source Oral, resp. rate 18, height 5\' 6"  (1.676 m), weight 78.5 kg, SpO2 100 %.Body mass index is 27.92 kg/m.  General Appearance: Fairly Groomed  Eye Contact:  Good  Speech:  Clear and Coherent  Volume:  Decreased  Mood:  Euthymic  Affect:  Constricted  Thought Process:  Goal Directed  Orientation:  Full (Time, Place, and Person)  Thought Content:  Logical  Suicidal Thoughts:  No  Homicidal Thoughts:  No  Memory:  Immediate;   Fair Recent;   Fair Remote;   Fair  Judgement:  Fair  Insight:  Fair  Psychomotor Activity:  Normal  Concentration:  Concentration: Fair  Recall:  [employer] of Knowledge:  Fair  Language:  Fair  Akathisia:  No  Handed:  Right  AIMS (  if indicated):     Assets:  Desire for Improvement Housing Physical Health Social Support  ADL's:  Intact  Cognition:  WNL  Sleep:  Number of Hours: 6.25     Have you used any form of tobacco in the last 30 days? (Cigarettes, Smokeless Tobacco, Cigars, and/or Pipes): No  Has this patient used any form of tobacco in the last 30 days? (Cigarettes, Smokeless Tobacco, Cigars, and/or Pipes) Yes, No  Blood Alcohol level:  Lab Results  Component Value Date   ETH 46 (H) 93/23/5573    Metabolic Disorder Labs:  Lab Results  Component Value Date   HGBA1C 9.2 (H) 05/30/2018   MPG 217.34 05/30/2018   MPG 228.82 05/29/2018   No results found for: PROLACTIN Lab Results  Component Value Date   CHOL 274 (H) 05/30/2018   TRIG 665 (H) 05/30/2018   HDL 37 (L) 05/30/2018   CHOLHDL 7.4 05/30/2018   VLDL UNABLE TO CALCULATE IF TRIGLYCERIDE OVER 400 mg/dL 05/30/2018   LDLCALC UNABLE TO CALCULATE IF TRIGLYCERIDE OVER 400 mg/dL 05/30/2018   LDLCALC UNABLE TO CALCULATE IF TRIGLYCERIDE OVER 400 mg/dL 12/31/2017    See Psychiatric Specialty Exam and Suicide Risk Assessment completed by Attending  Physician prior to discharge.  Discharge destination:  Home  Is patient on multiple antipsychotic therapies at discharge:  No   Has Patient had three or more failed trials of antipsychotic monotherapy by history:  No  Recommended Plan for Multiple Antipsychotic Therapies: NA  Discharge Instructions    Diet - low sodium heart healthy   Complete by:  As directed    Diet - low sodium heart healthy   Complete by:  As directed    Increase activity slowly   Complete by:  As directed    Increase activity slowly   Complete by:  As directed      Allergies as of 06/04/2018   No Known Allergies     Medication List    STOP taking these medications   acetaminophen 500 MG tablet Commonly known as:  TYLENOL     TAKE these medications     Indication  aspirin 81 MG chewable tablet Chew 1 tablet (81 mg total) by mouth daily.  Indication:  Coronary artery disease   carvedilol 25 MG tablet Commonly known as:  Coreg Take 1 tablet (25 mg total) by mouth 2 (two) times daily.  Indication:  High Blood Pressure Disorder   FLUoxetine 20 MG capsule Commonly known as:  PROZAC Take 1 capsule (20 mg total) by mouth at bedtime.  Indication:  Depression   glipiZIDE 10 MG tablet Commonly known as:  GLUCOTROL Take 1 tablet (10 mg total) by mouth 2 (two) times daily.  Indication:  Type 2 Diabetes   hydrOXYzine 50 MG tablet Commonly known as:  ATARAX/VISTARIL Take 1 tablet (50 mg total) by mouth 3 (three) times daily as needed for anxiety.  Indication:  Feeling Anxious   insulin glargine 100 UNIT/ML injection Commonly known as:  LANTUS Inject 0.1 mLs (10 Units total) into the skin daily.  Indication:  Type 2 Diabetes   losartan 100 MG tablet Commonly known as:  Cozaar Take 1 tablet (100 mg total) by mouth daily.  Indication:  High Blood Pressure Disorder   metFORMIN 1000 MG tablet Commonly known as:  GLUCOPHAGE Take 1 tablet (1,000 mg total) by mouth 2 (two) times daily with a meal.   Indication:  Type 2 Diabetes   rosuvastatin 10 MG tablet Commonly known as:  CRESTOR Take 1 tablet (10 mg total) by mouth daily.  Indication:  High Amount of Fats in the Blood   traZODone 100 MG tablet Commonly known as:  DESYREL Take 1 tablet (100 mg total) by mouth at bedtime as needed for sleep.  Indication:  [place] Follow up.   Why:  Please meet Harvey on 06/05/2018 at 7:15AM. Contact information: [place] [address] [phone]           Follow-up recommendations:  Activity:  Activity as tolerated Diet:  Heart healthy diet Other:  Follow-up with [facility] for both substance abuse and mental health treatment.  Comments: Psychoeducation and medical education completed.  Medicines reviewed.  Patient will follow-up with local outpatient treatment.  7-day supply given in addition to paper prescriptions.  Signed: Alethia Berthold, MD 06/04/2018, 11:02 AM

## 2018-07-12 ENCOUNTER — Emergency Department
Admission: EM | Admit: 2018-07-12 | Discharge: 2018-07-12 | Disposition: A | Discharge status: Home / Self Care | Payer: Self-pay | Attending: Emergency Medicine | Admitting: Emergency Medicine

## 2018-07-12 ENCOUNTER — Other Ambulatory Visit: Payer: Self-pay

## 2018-07-12 DIAGNOSIS — R112 Nausea with vomiting, unspecified: Secondary | ICD-10-CM

## 2018-07-12 DIAGNOSIS — Z79899 Other long term (current) drug therapy: Secondary | ICD-10-CM | POA: Insufficient documentation

## 2018-07-12 DIAGNOSIS — I10 Essential (primary) hypertension: Secondary | ICD-10-CM | POA: Insufficient documentation

## 2018-07-12 DIAGNOSIS — I2510 Atherosclerotic heart disease of native coronary artery without angina pectoris: Secondary | ICD-10-CM | POA: Insufficient documentation

## 2018-07-12 DIAGNOSIS — E860 Dehydration: Secondary | ICD-10-CM | POA: Insufficient documentation

## 2018-07-12 DIAGNOSIS — E119 Type 2 diabetes mellitus without complications: Secondary | ICD-10-CM | POA: Insufficient documentation

## 2018-07-12 DIAGNOSIS — F329 Major depressive disorder, single episode, unspecified: Secondary | ICD-10-CM | POA: Insufficient documentation

## 2018-07-12 DIAGNOSIS — Z87891 Personal history of nicotine dependence: Secondary | ICD-10-CM | POA: Insufficient documentation

## 2018-07-12 DIAGNOSIS — R531 Weakness: Secondary | ICD-10-CM

## 2018-07-12 DIAGNOSIS — Z794 Long term (current) use of insulin: Secondary | ICD-10-CM | POA: Insufficient documentation

## 2018-07-12 DIAGNOSIS — Z7982 Long term (current) use of aspirin: Secondary | ICD-10-CM | POA: Insufficient documentation

## 2018-07-12 DIAGNOSIS — F1020 Alcohol dependence, uncomplicated: Secondary | ICD-10-CM | POA: Insufficient documentation

## 2018-07-12 HISTORY — DX: Scoliosis, unspecified: M41.9

## 2018-07-12 LAB — CBC WITH DIFFERENTIAL/PLATELET
Abs Immature Granulocytes: 0.05 10*3/uL (ref 0.00–0.07)
Basophils Absolute: 0.1 10*3/uL (ref 0.0–0.1)
Basophils Relative: 1 %
Eosinophils Absolute: 0.1 10*3/uL (ref 0.0–0.5)
Eosinophils Relative: 2 %
HCT: 31.2 % — ABNORMAL LOW (ref 39.0–52.0)
Hemoglobin: 10.4 g/dL — ABNORMAL LOW (ref 13.0–17.0)
Immature Granulocytes: 1 %
Lymphocytes Relative: 20 %
Lymphs Abs: 1.5 10*3/uL (ref 0.7–4.0)
MCH: 31 pg (ref 26.0–34.0)
MCHC: 33.3 g/dL (ref 30.0–36.0)
MCV: 92.9 fL (ref 80.0–100.0)
Monocytes Absolute: 0.4 10*3/uL (ref 0.1–1.0)
Monocytes Relative: 5 %
Neutro Abs: 5.3 10*3/uL (ref 1.7–7.7)
Neutrophils Relative %: 71 %
Platelets: 170 10*3/uL (ref 150–400)
RBC: 3.36 MIL/uL — ABNORMAL LOW (ref 4.22–5.81)
RDW: 15.4 % (ref 11.5–15.5)
WBC: 7.3 10*3/uL (ref 4.0–10.5)
nRBC: 0 % (ref 0.0–0.2)

## 2018-07-12 LAB — COMPREHENSIVE METABOLIC PANEL
ALT: 21 U/L (ref 0–44)
AST: 34 U/L (ref 15–41)
Albumin: 4.1 g/dL (ref 3.5–5.0)
Alkaline Phosphatase: 137 U/L — ABNORMAL HIGH (ref 38–126)
Anion gap: 17 — ABNORMAL HIGH (ref 5–15)
BUN: 8 mg/dL (ref 6–20)
CO2: 25 mmol/L (ref 22–32)
Calcium: 8.6 mg/dL — ABNORMAL LOW (ref 8.9–10.3)
Chloride: 93 mmol/L — ABNORMAL LOW (ref 98–111)
Creatinine, Ser: 1.47 mg/dL — ABNORMAL HIGH (ref 0.61–1.24)
GFR calc Af Amer: >60 mL/min (ref >60)
GFR calc non Af Amer: 55 mL/min — ABNORMAL LOW (ref >60)
Glucose, Bld: 250 mg/dL — ABNORMAL HIGH (ref 70–99)
Potassium: 3.5 mmol/L (ref 3.5–5.1)
Sodium: 135 mmol/L (ref 135–145)
Total Bilirubin: 1.7 mg/dL — ABNORMAL HIGH (ref 0.3–1.2)
Total Protein: 7.1 g/dL (ref 6.5–8.1)

## 2018-07-12 LAB — GLUCOSE, CAPILLARY: Glucose-Capillary: 240 mg/dL — ABNORMAL HIGH (ref 70–99)

## 2018-07-12 LAB — TROPONIN I: Troponin I: <0.03 ng/mL (ref <0.03)

## 2018-07-12 LAB — ETHANOL: Alcohol, Ethyl (B): <10 mg/dL (ref <10)

## 2018-07-12 MED ORDER — SODIUM CHLORIDE 0.9 % IV BOLUS
1000.0000 mL | Freq: Once | INTRAVENOUS | Status: AC
  Administered 2018-07-12: 1000 mL via INTRAVENOUS

## 2018-07-12 NOTE — ED Provider Notes (Signed)
[facility] Emergency Department Provider Note ____________________________________________   None    (approximate)  I have reviewed the triage vital signs and the nursing notes.   HISTORY  Chief Complaint Weakness  HPI Christian [name] is a 48 y.o. male who presents to the emergency department for treatment and evaluation for generalized weakness "all day" with onset of blurry vision about an hour and a half prior to arrival. He states that he called the nurse line as was advised to come to the ER and that he "may have had a stroke." He states his legs "feel like they are going to give out, which is not new. Significant PMH includes CAD, HTN, Diabetes type II, and alcohol use disorder. Last ETOH intake was yesterday. He denies chest pain, shortness of breath, or other symptoms of concern.     Past Medical History:  Diagnosis Date  . CAD (coronary artery disease)    a. 07/2013 NSTEMI/PCI: LCX 48m (4.0x23 Xience DES), RCA 79m, EF > 55%;  b. 07/2013 Echo: EF 60-65%, diast dysfxn, mild LVH, mildly dil LA, nl RV size/fxn, nl RVSP.  Marland Kitchen History of tobacco abuse    a. quit ~ 2010  . Hyperlipidemia   . Morbid obesity ([place])    a. weighed 168 @ age 38.  . Scoliosis     Patient Active Problem List   Diagnosis Date Noted  . Severe recurrent major depression without psychotic features ([place]) 05/29/2018  . Suicidal ideation 05/29/2018  . Alcohol use disorder, moderate, dependence ([place]) 05/29/2018  . Hyperosmolar non-ketotic state in patient with type 2 diabetes mellitus ([place]) 05/08/2018  . Chest pain 12/30/2017  . CAD (coronary artery disease)   . HTN (hypertension)   . Hyperlipidemia   . Diabetes mellitus, type II ([place])   . Morbid obesity ([place])   . History of tobacco abuse     Past Surgical History:  Procedure Laterality Date  . ADENOIDECTOMY    . CARDIAC CATHETERIZATION  07/2013   [facility]'x1 stent    Prior to Admission medications   Medication Sig Start Date End  Date Taking? Authorizing Provider  aspirin 81 MG chewable tablet Chew 1 tablet (81 mg total) by mouth daily. 06/04/18   Clapacs, Madie Reno, MD  carvedilol (COREG) 25 MG tablet Take 1 tablet (25 mg total) by mouth 2 (two) times daily. 06/04/18 06/04/19  Clapacs, Madie Reno, MD  FLUoxetine (PROZAC) 20 MG capsule Take 1 capsule (20 mg total) by mouth at bedtime. 06/04/18   Clapacs, Madie Reno, MD  glipiZIDE (GLUCOTROL) 10 MG tablet Take 1 tablet (10 mg total) by mouth 2 (two) times daily. 06/04/18   Clapacs, Madie Reno, MD  hydrOXYzine (ATARAX/VISTARIL) 50 MG tablet Take 1 tablet (50 mg total) by mouth 3 (three) times daily as needed for anxiety. 06/04/18   Clapacs, Madie Reno, MD  insulin glargine (LANTUS) 100 UNIT/ML injection Inject 0.1 mLs (10 Units total) into the skin daily. 06/04/18   Clapacs, Madie Reno, MD  losartan (COZAAR) 100 MG tablet Take 1 tablet (100 mg total) by mouth daily. 06/04/18 06/04/19  Clapacs, Madie Reno, MD  metFORMIN (GLUCOPHAGE) 1000 MG tablet Take 1 tablet (1,000 mg total) by mouth 2 (two) times daily with a meal. 06/04/18   Clapacs, Madie Reno, MD  rosuvastatin (CRESTOR) 10 MG tablet Take 1 tablet (10 mg total) by mouth daily. 06/04/18   Clapacs, Madie Reno, MD  traZODone (DESYREL) 100 MG tablet Take 1 tablet (100 mg total) by mouth  at bedtime as needed for sleep. 06/04/18   Clapacs, Madie Reno, MD    Allergies Patient has no known allergies.  Family History  Problem Relation Age of Onset  . Heart disease Mother   . Esophageal cancer Father     Social History Social History   Tobacco Use  . Smoking status: Former Smoker    Packs/day: 0.50    Years: 20.00    Pack years: 10.00    Types: Cigarettes  . Smokeless tobacco: Never Used  Substance Use Topics  . Alcohol use: Yes  . Drug use: No    Types: Marijuana    Comment: past    Review of Systems  Constitutional: No fever/chills Eyes: No visual changes. ENT: No sore throat. Cardiovascular: Denies chest pain. Respiratory: Denies shortness of  breath. Gastrointestinal: No abdominal pain.  No nausea, no vomiting.  No diarrhea.  No constipation. Genitourinary: Negative for dysuria. Musculoskeletal: Negative for back pain. Skin: Negative for rash. Neurological: Negative for headaches, focal weakness or numbness. ____________________________________________   PHYSICAL EXAM:  VITAL SIGNS: ED Triage Vitals  Enc Vitals Group     BP 07/12/18 1956 107/72     Pulse Rate 07/12/18 1956 83     Resp 07/12/18 1956 16     Temp 07/12/18 1956 98 F (36.7 C)     Temp Source 07/12/18 1956 Oral     SpO2 07/12/18 1956 100 %     Weight 07/12/18 2003 164 lb (74.4 kg)     Height 07/12/18 2003 5\' 6"  (1.676 m)     Head Circumference --      Peak Flow --      Pain Score 07/12/18 2003 4     Pain Loc --      Pain Edu? --      Excl. in [place]? --     Constitutional: Alert and oriented. Well appearing and in no acute distress. Eyes: Conjunctivae are normal. PERRL. Head: Atraumatic. Nose: No congestion/rhinnorhea. Mouth/Throat: Mucous membranes are moist.  Oropharynx non-erythematous. Neck: No stridor.   Cardiovascular: Normal rate, regular rhythm. Grossly normal heart sounds.  Good peripheral circulation. Respiratory: Normal respiratory effort.  No retractions. Lungs CTAB. Gastrointestinal: Soft and nontender. No distention. No abdominal bruits. No CVA tenderness. Musculoskeletal: No lower extremity tenderness nor edema.  No joint effusions. Neurologic:  Normal speech and language. No gross focal neurologic deficits are appreciated. No gait instability. Cranial nerves II-IIX normal as tested. No facial droop, slurred speech. Tongue protrudes midline. Equal shoulder shrug. Equal grip strength. No pronotor drift. No extremity neglect. Skin:  Skin is warm, dry and intact. No rash noted. Psychiatric: Mood and affect are normal. Speech and behavior are normal.  ____________________________________________   LABS (all labs ordered are listed, but  only abnormal results are displayed)  Labs Reviewed  GLUCOSE, CAPILLARY - Abnormal; Notable for the following components:      Result Value   Glucose-Capillary 240 (*)    All other components within normal limits  CBC WITH DIFFERENTIAL/PLATELET - Abnormal; Notable for the following components:   RBC 3.36 (*)    Hemoglobin 10.4 (*)    HCT 31.2 (*)    All other components within normal limits  COMPREHENSIVE METABOLIC PANEL - Abnormal; Notable for the following components:   Chloride 93 (*)    Glucose, Bld 250 (*)    Creatinine, Ser 1.47 (*)    Calcium 8.6 (*)    Alkaline Phosphatase 137 (*)    Total Bilirubin 1.7 (*)  GFR calc non Af Amer 55 (*)    Anion gap 17 (*)    All other components within normal limits  ETHANOL  TROPONIN I   ____________________________________________  EKG  ED ECG REPORT I, Anaira Seay, FNP-BC personally viewed and interpreted this ECG.   Date: 07/12/2018  EKG Time: 2030  Rate: 84  Rhythm: Sinus rhythm, unchanged from previous tracings  Axis: right  Intervals:none  ST&T Change: no ST elevation.  ____________________________________________  RADIOLOGY  ED MD interpretation:  Not indicated.  Official radiology report(s): No results found.  ____________________________________________   PROCEDURES  Procedure(s) performed: None  Procedures  Critical Care performed: No  ____________________________________________   INITIAL IMPRESSION / ASSESSMENT AND PLAN / ED COURSE    48 year old male presents to the emergency department for treatment and evaluation of generalized weakness and blurred vision. He denies new or focal extremity weakness. His neuro exam is completely normal. No indication for images at this time. Will proceed with labs and monitor him for a bit.  ----------------------------------------- 9:30 PM on 07/12/2018 -----------------------------------------  Case discussed with Dr. Alfred Levins. Plan will be to give  IVF and check an ETOH level due to anion gap. She will decide on disposition afterward. Patient re evaluated. No change in neuro status.       ____________________________________________   FINAL CLINICAL IMPRESSION(S) / ED DIAGNOSES  Final diagnoses:  Generalized weakness  Dehydration  Non-intractable vomiting with nausea, unspecified vomiting type     ED Discharge Orders    None       Note:  This document was prepared using Dragon voice recognition software and may include unintentional dictation errors.    Victorino Dike, FNP 07/16/18 [place], [place], MD 07/17/18 [phone]

## 2018-07-12 NOTE — ED Notes (Signed)
Assisted pt with getting to ED room, took blood sugar.

## 2018-07-12 NOTE — ED Triage Notes (Signed)
Patient with complaint of weakness that started this morning. Patient states that he checked his blood pressure at home about an hour ago and that his systolic bp in 159'E and then he rechecked it 20 minutes ago it was in the 80's. Patient states that he has left side chest pain times 2-3 months.

## 2018-07-12 NOTE — ED Provider Notes (Deleted)
For further details please refer to NP's note.  In summary this is a 48 year old male with a history of CAD, hyperlipidemia, type 2 diabetes, alcohol abuse who presents for evaluation of generalized weakness.  Patient reports feeling unwell since he woke up this morning.  Had one episode of nausea and vomited one time.  No diarrhea.  Has had bilateral leg weakness.  On exam is well-appearing and in no distress, has normal vital signs, patient has slight weakness in bilateral lower extremities but otherwise completely normal neurological exam.  ED ECG REPORT I, Rudene Re, the attending physician, personally viewed and interpreted this ECG.  Normal sinus rhythm, rate of 84, prolonged QTC, right axis deviation, no ST elevations or depressions.  Unchanged from prior.   Labs showing stable mild anemia, no significant electrolyte abnormalities.  Patient does have mild bump in his creatinine consistent with mild dehydration.  Also has a anion gap of 17.  He reports last alcohol intake was yesterday.  Will check an ethanol level.  Will give IVF. Urinalysis is pending.  No evidence of DKA.  _________________________ 10:58 PM on 07/12/2018 -----------------------------------------  Patient received IV fluids, will be discharged home with increase oral hydration and follow-up with primary care doctor.  Discussed and return precautions     Rudene Re, MD 07/12/18 2258

## 2018-07-29 ENCOUNTER — Telehealth: Payer: Self-pay | Admitting: Pharmacy Technician

## 2018-07-29 NOTE — Telephone Encounter (Signed)
Patient failed to provide requested financial documentation. Financial documentation is required in order to determine patient's eligibility for [facility] program. No additional medication assistance will be provided until patient provides requested financial documentation. Patient notified by letter.

## 2018-08-04 ENCOUNTER — Telehealth: Payer: Self-pay

## 2018-08-04 NOTE — Telephone Encounter (Signed)

## 2018-08-06 ENCOUNTER — Telehealth: Payer: Self-pay | Admitting: Cardiovascular Disease

## 2018-08-06 ENCOUNTER — Other Ambulatory Visit: Payer: Self-pay

## 2018-08-06 ENCOUNTER — Encounter

## 2019-02-03 ENCOUNTER — Emergency Department
Admission: EM | Admit: 2019-02-03 | Discharge: 2019-02-05 | Disposition: A | Discharge status: Other Acute Inpatient Hospital | Payer: Medicaid Other | Attending: Emergency Medicine | Admitting: Emergency Medicine

## 2019-02-03 ENCOUNTER — Other Ambulatory Visit: Payer: Self-pay

## 2019-02-03 DIAGNOSIS — Z9114 Patient's other noncompliance with medication regimen: Secondary | ICD-10-CM | POA: Insufficient documentation

## 2019-02-03 DIAGNOSIS — F329 Major depressive disorder, single episode, unspecified: Secondary | ICD-10-CM | POA: Insufficient documentation

## 2019-02-03 DIAGNOSIS — Z87891 Personal history of nicotine dependence: Secondary | ICD-10-CM | POA: Insufficient documentation

## 2019-02-03 DIAGNOSIS — I10 Essential (primary) hypertension: Secondary | ICD-10-CM | POA: Diagnosis not present

## 2019-02-03 DIAGNOSIS — Z7984 Long term (current) use of oral hypoglycemic drugs: Secondary | ICD-10-CM | POA: Diagnosis not present

## 2019-02-03 DIAGNOSIS — I259 Chronic ischemic heart disease, unspecified: Secondary | ICD-10-CM | POA: Insufficient documentation

## 2019-02-03 DIAGNOSIS — Z20828 Contact with and (suspected) exposure to other viral communicable diseases: Secondary | ICD-10-CM | POA: Insufficient documentation

## 2019-02-03 DIAGNOSIS — E119 Type 2 diabetes mellitus without complications: Secondary | ICD-10-CM | POA: Insufficient documentation

## 2019-02-03 DIAGNOSIS — F332 Major depressive disorder, recurrent severe without psychotic features: Secondary | ICD-10-CM

## 2019-02-03 DIAGNOSIS — R739 Hyperglycemia, unspecified: Secondary | ICD-10-CM

## 2019-02-03 DIAGNOSIS — R45851 Suicidal ideations: Secondary | ICD-10-CM

## 2019-02-03 LAB — CBC WITH DIFFERENTIAL/PLATELET
Abs Immature Granulocytes: 0.04 10*3/uL (ref 0.00–0.07)
Basophils Absolute: 0.1 10*3/uL (ref 0.0–0.1)
Basophils Relative: 2 %
Eosinophils Absolute: 0.2 10*3/uL (ref 0.0–0.5)
Eosinophils Relative: 3 %
HCT: 39 % (ref 39.0–52.0)
Hemoglobin: 14.2 g/dL (ref 13.0–17.0)
Immature Granulocytes: 1 %
Lymphocytes Relative: 34 %
Lymphs Abs: 1.7 10*3/uL (ref 0.7–4.0)
MCH: 32.7 pg (ref 26.0–34.0)
MCHC: 36.4 g/dL — ABNORMAL HIGH (ref 30.0–36.0)
MCV: 89.9 fL (ref 80.0–100.0)
Monocytes Absolute: 0.6 10*3/uL (ref 0.1–1.0)
Monocytes Relative: 12 %
Neutro Abs: 2.4 10*3/uL (ref 1.7–7.7)
Neutrophils Relative %: 48 %
Platelets: 186 10*3/uL (ref 150–400)
RBC: 4.34 MIL/uL (ref 4.22–5.81)
RDW: 12.9 % (ref 11.5–15.5)
WBC: 4.9 10*3/uL (ref 4.0–10.5)
nRBC: 0 % (ref 0.0–0.2)

## 2019-02-03 LAB — URINE DRUG SCREEN, QUALITATIVE (ARMC ONLY)
Amphetamines, Ur Screen: NOT DETECTED
Barbiturates, Ur Screen: NOT DETECTED
Benzodiazepine, Ur Scrn: NOT DETECTED
Cannabinoid 50 Ng, Ur ~~LOC~~: NOT DETECTED
Cocaine Metabolite,Ur ~~LOC~~: NOT DETECTED
MDMA (Ecstasy)Ur Screen: NOT DETECTED
Methadone Scn, Ur: NOT DETECTED
Opiate, Ur Screen: NOT DETECTED
Phencyclidine (PCP) Ur S: NOT DETECTED
Tricyclic, Ur Screen: NOT DETECTED

## 2019-02-03 LAB — COMPREHENSIVE METABOLIC PANEL
ALT: 17 U/L (ref 0–44)
AST: 16 U/L (ref 15–41)
Albumin: 4.4 g/dL (ref 3.5–5.0)
Alkaline Phosphatase: 65 U/L (ref 38–126)
Anion gap: 11 (ref 5–15)
BUN: 13 mg/dL (ref 6–20)
CO2: 29 mmol/L (ref 22–32)
Calcium: 9.4 mg/dL (ref 8.9–10.3)
Chloride: 97 mmol/L — ABNORMAL LOW (ref 98–111)
Creatinine, Ser: 0.71 mg/dL (ref 0.61–1.24)
GFR calc Af Amer: >60 mL/min (ref >60)
GFR calc non Af Amer: >60 mL/min (ref >60)
Glucose, Bld: 365 mg/dL — ABNORMAL HIGH (ref 70–99)
Potassium: 4.2 mmol/L (ref 3.5–5.1)
Sodium: 137 mmol/L (ref 135–145)
Total Bilirubin: 2.2 mg/dL — ABNORMAL HIGH (ref 0.3–1.2)
Total Protein: 7.7 g/dL (ref 6.5–8.1)

## 2019-02-03 LAB — GLUCOSE, CAPILLARY
Glucose-Capillary: 460 mg/dL — ABNORMAL HIGH (ref 70–99)
Glucose-Capillary: 397 mg/dL — ABNORMAL HIGH (ref 70–99)

## 2019-02-03 LAB — TSH: TSH: 1.665 u[IU]/mL (ref 0.350–4.500)

## 2019-02-03 LAB — ETHANOL: Alcohol, Ethyl (B): <10 mg/dL (ref <10)

## 2019-02-03 MED ORDER — CARVEDILOL 25 MG PO TABS
25.0000 mg | ORAL_TABLET | Freq: Two times a day (BID) | ORAL | Status: DC
  Administered 2019-02-03 – 2019-02-04 (×3): 25 mg via ORAL
  Filled 2019-02-03 (×4): qty 1

## 2019-02-03 MED ORDER — ASPIRIN 81 MG PO CHEW
81.0000 mg | CHEWABLE_TABLET | Freq: Every day | ORAL | Status: DC
  Administered 2019-02-03 – 2019-02-04 (×2): 81 mg via ORAL
  Filled 2019-02-03 (×4): qty 1

## 2019-02-03 MED ORDER — FLUOXETINE HCL 20 MG PO CAPS
20.0000 mg | ORAL_CAPSULE | Freq: Every day | ORAL | Status: DC
  Administered 2019-02-03 – 2019-02-04 (×2): 20 mg via ORAL
  Filled 2019-02-03 (×2): qty 1

## 2019-02-03 MED ORDER — METFORMIN HCL 500 MG PO TABS
500.0000 mg | ORAL_TABLET | Freq: Two times a day (BID) | ORAL | Status: DC
  Administered 2019-02-03 – 2019-02-04 (×3): 500 mg via ORAL
  Filled 2019-02-03 (×2): qty 1

## 2019-02-03 MED ORDER — ROSUVASTATIN CALCIUM 10 MG PO TABS
10.0000 mg | ORAL_TABLET | Freq: Every day | ORAL | Status: DC
  Administered 2019-02-03 – 2019-02-04 (×2): 10 mg via ORAL
  Filled 2019-02-03 (×4): qty 1

## 2019-02-03 MED ORDER — INSULIN ASPART 100 UNIT/ML ~~LOC~~ SOLN
10.0000 [IU] | Freq: Once | SUBCUTANEOUS | Status: AC
  Administered 2019-02-03: 10 [IU] via SUBCUTANEOUS
  Filled 2019-02-03: qty 1

## 2019-02-03 MED ORDER — INSULIN GLARGINE 100 UNIT/ML ~~LOC~~ SOLN
10.0000 [IU] | Freq: Every day | SUBCUTANEOUS | Status: DC
  Administered 2019-02-04 (×2): 10 [IU] via SUBCUTANEOUS
  Filled 2019-02-03 (×3): qty 0.1

## 2019-02-03 MED ORDER — INSULIN ASPART 100 UNIT/ML ~~LOC~~ SOLN
0.0000 [IU] | Freq: Three times a day (TID) | SUBCUTANEOUS | Status: DC
  Administered 2019-02-04 (×2): 8 [IU] via SUBCUTANEOUS
  Administered 2019-02-04: 5 [IU] via SUBCUTANEOUS
  Filled 2019-02-03 (×2): qty 1

## 2019-02-03 MED ORDER — GLIPIZIDE 10 MG PO TABS
10.0000 mg | ORAL_TABLET | Freq: Two times a day (BID) | ORAL | Status: DC
  Filled 2019-02-03: qty 1

## 2019-02-03 MED ORDER — TRAZODONE HCL 100 MG PO TABS: 100.0000 mg | ORAL_TABLET | Freq: Every evening | ORAL | Status: DC | PRN

## 2019-02-03 MED ORDER — GLIPIZIDE 10 MG PO TABS
10.0000 mg | ORAL_TABLET | Freq: Two times a day (BID) | ORAL | Status: DC
  Administered 2019-02-04 (×3): 10 mg via ORAL
  Filled 2019-02-03 (×4): qty 1

## 2019-02-03 MED ORDER — LOSARTAN POTASSIUM 50 MG PO TABS
100.0000 mg | ORAL_TABLET | Freq: Every day | ORAL | Status: DC
  Administered 2019-02-03 – 2019-02-04 (×2): 100 mg via ORAL
  Filled 2019-02-03 (×4): qty 2

## 2019-02-03 NOTE — ED Notes (Signed)
Patient ate 100% of supper and beverage, He is calm and cooperative, will continue to monitor, q 15 minute checks and camera surveillance in progress for safety.

## 2019-02-03 NOTE — ED Triage Notes (Signed)
Reports "depressed and tired of living, states slept in his car last night thinking of SI, plan  Was to take pills and sleep in his car".

## 2019-02-03 NOTE — ED Notes (Signed)
Patient ambulated over to [facility], room 3, no signs of distress, Patient is pleasant and cooperative, will continue to monitor, oriented him to the unit.

## 2019-02-03 NOTE — ED Notes (Signed)
Awaiting psych eval.

## 2019-02-03 NOTE — ED Notes (Signed)
Hourly rounding reveals patient in room. No complaints, stable, in no acute distress. Q15 minute rounds and monitoring via Security Cameras to continue. 

## 2019-02-03 NOTE — ED Notes (Signed)
Report to include Situation, Background, Assessment, and Recommendations received from [facility]. Patient alert and oriented, warm and dry, in no acute distress. Patient denies SI, HI, AVH and pain. He said he just worried about going home and dealing with his mother. He said his mother has early dementia and he is having hard time dealing with her behavior. Patient made aware of Q15 minute rounds and security cameras for their safety. Patient instructed to come to me with needs or concerns.

## 2019-02-03 NOTE — ED Notes (Signed)
Pt dressed out by this tech and [employer]. Pt Belongings: Pearline Cables shorts Black hat [employer] Pearline Cables slippers 1 Id tag 1 key

## 2019-02-03 NOTE — ED Notes (Signed)
MD at bedside to assess. Patient awaiting psych eval and plan of care.

## 2019-02-03 NOTE — ED Notes (Signed)
Patient reports lives at home with his mother, takes care of her. States he lost his job when covid began and has not been able to keep a job due to [employer] and unable to stand for no longer than 8 hours a day. Reports tired of life he is living wants to die. States he slept in his car last night and was thinking of taking pills and just going to sleep. Patient did not act on these thouights, reports feeling very depressed and SI. Denies HI/HV.

## 2019-02-03 NOTE — BH Assessment (Addendum)
Patient is to be admitted to [facility] by Dr. Claris Gower.  Attending Physician will be Dr. Weber Cooks.   Patient has been assigned to room 322, by [facility] Charge Nurse Springer.   Voluntary Admission Paper Work has been signed and placed on patient chart.   ER staff is aware of the admission:  Juliann Pulse, ER Secretary    Dr. Ellender Hose, ER MD   Dyke Maes, Patient's Nurse   Celine Ahr, Patient Access.

## 2019-02-03 NOTE — ED Provider Notes (Addendum)
[facility] Emergency Department Provider Note       Time seen: ----------------------------------------- 1:41 PM on 02/03/2019 -----------------------------------------   I have reviewed the triage vital signs and the nursing notes.  HISTORY   Chief Complaint Suicidal    HPI Christian [name] is a 48 y.o. male with a history of coronary artery disease, hyperlipidemia, obesity, diabetes, morbid obesity who presents to the ED for depression.  Patient reports being depressed and feeling tired of living.  Patient states he slept in his car last night thinking of killing himself with a plan to take pills and sleep in his car.  He denies any complaints at this time.  Past Medical History:  Diagnosis Date  . CAD (coronary artery disease)    a. 07/2013 NSTEMI/PCI: LCX 77m (4.0x23 Xience DES), RCA 28m, EF > 55%;  b. 07/2013 Echo: EF 60-65%, diast dysfxn, mild LVH, mildly dil LA, nl RV size/fxn, nl RVSP.  Marland Kitchen History of tobacco abuse    a. quit ~ 2010  . Hyperlipidemia   . Morbid obesity ([place])    a. weighed 168 @ age 54.  . Scoliosis     Patient Active Problem List   Diagnosis Date Noted  . Severe recurrent major depression without psychotic features ([place]) 05/29/2018  . Suicidal ideation 05/29/2018  . Alcohol use disorder, moderate, dependence ([place]) 05/29/2018  . Hyperosmolar non-ketotic state in patient with type 2 diabetes mellitus ([place]) 05/08/2018  . Chest pain 12/30/2017  . CAD (coronary artery disease)   . HTN (hypertension)   . Hyperlipidemia   . Diabetes mellitus, type II ([place])   . Morbid obesity ([place])   . History of tobacco abuse     Past Surgical History:  Procedure Laterality Date  . ADENOIDECTOMY    . CARDIAC CATHETERIZATION  07/2013   [facility]'x1 stent    Allergies Patient has no known allergies.  Social History Social History   Tobacco Use  . Smoking status: Former Smoker    Packs/day: 0.50    Years: 20.00    Pack years: 10.00    Types:  Cigarettes  . Smokeless tobacco: Never Used  Substance Use Topics  . Alcohol use: Yes  . Drug use: No    Types: Marijuana    Comment: past   Review of Systems Constitutional: Negative for fever. Cardiovascular: Negative for chest pain. Respiratory: Negative for shortness of breath. Gastrointestinal: Negative for abdominal pain, vomiting and diarrhea. Musculoskeletal: Negative for back pain. Skin: Negative for rash. Neurological: Negative for headaches, focal weakness or numbness. Psychiatric: Positive for depression and suicidal ideation  All systems negative/normal/unremarkable except as stated in the HPI  ____________________________________________   PHYSICAL EXAM:  VITAL SIGNS: ED Triage Vitals  Enc Vitals Group     BP 02/03/19 1338 (!) 148/90     Pulse Rate 02/03/19 1338 96     Resp 02/03/19 1338 18     Temp 02/03/19 1338 98.6 F (37 C)     Temp Source 02/03/19 1338 Oral     SpO2 02/03/19 1338 97 %     Weight 02/03/19 1339 165 lb (74.8 kg)     Height 02/03/19 1339 5\' 6"  (1.676 m)     Head Circumference --      Peak Flow --      Pain Score 02/03/19 1339 0     Pain Loc --      Pain Edu? --      Excl. in [place]? --    Constitutional:  Alert and oriented. Well appearing and in no distress. Eyes: Conjunctivae are normal. Normal extraocular movements. Cardiovascular: Normal rate, regular rhythm. No murmurs, rubs, or gallops. Respiratory: Normal respiratory effort without tachypnea nor retractions. Breath sounds are clear and equal bilaterally. No wheezes/rales/rhonchi. Gastrointestinal: Soft and nontender. Normal bowel sounds Musculoskeletal: Nontender with normal range of motion in extremities. No lower extremity tenderness nor edema. Neurologic:  Normal speech and language. No gross focal neurologic deficits are appreciated.  Skin:  Skin is warm, dry and intact. No rash noted. Psychiatric: Depressed mood and affect, suicidal  ideation  ____________________________________________  ED COURSE:  As part of my medical decision making, I reviewed the following data within the [place] History obtained from family if available, nursing notes, old chart and ekg, as well as notes from prior ED visits. Patient presented for depression and suicidal ideation, we will assess with labs as indicated at this time.   Procedures  Christian [name] was evaluated in Emergency Department on 02/03/2019 for the symptoms described in the history of present illness. He was evaluated in the context of the global COVID-19 pandemic, which necessitated consideration that the patient might be at risk for infection with the SARS-CoV-2 virus that causes COVID-19. Institutional protocols and algorithms that pertain to the evaluation of patients at risk for COVID-19 are in a state of rapid change based on information released by regulatory bodies including the CDC and federal and state organizations. These policies and algorithms were followed during the patient's care in the ED.  ____________________________________________   LABS (pertinent positives/negatives)  Labs Reviewed  CBC WITH DIFFERENTIAL/PLATELET - Abnormal; Notable for the following components:      Result Value   MCHC 36.4 (*)    All other components within normal limits  COMPREHENSIVE METABOLIC PANEL - Abnormal; Notable for the following components:   Chloride 97 (*)    Glucose, Bld 365 (*)    Total Bilirubin 2.2 (*)    All other components within normal limits  TSH  ETHANOL  URINE DRUG SCREEN, QUALITATIVE ([facility] ONLY)   ____________________________________________   DIFFERENTIAL DIAGNOSIS   Depression, suicidal ideation, substance abuse  FINAL ASSESSMENT AND PLAN  Depression, suicidal ideation, diabetes   Plan: The patient had presented for depression with suicidal thoughts. Patient's labs did not reveal any acute process, he has not been taking his  diabetes medicines.  Blood sugar is 365.  I have restarted Metformin for him.  He appears medically clear for psychiatric evaluation and disposition.    Laurence Aly, MD    Note: This note was generated in part or whole with voice recognition software. Voice recognition is usually quite accurate but there are transcription errors that can and very often do occur. I apologize for any typographical errors that were not detected and corrected.     Earleen Newport, MD 02/03/19 1343    Earleen Newport, MD 02/03/19 1434

## 2019-02-03 NOTE — ED Notes (Signed)
Recheck BG 397. Notified EDP

## 2019-02-03 NOTE — ED Notes (Signed)
Report to Lanny Cramp patient transferred to [facility]. Awaiting psych eval and plan of care.

## 2019-02-03 NOTE — Consult Note (Signed)
[facility] Psychiatry Consult   Reason for Consult: Depression and SI Referring Physician: Dr. Jimmye Norman Patient Identification: Christian [name] MRN:  [id number] Principal Diagnosis: <principal problem not specified> Diagnosis:  Active Problems:   * No active hospital problems. *   Total Time spent with patient: 45 minutes  Subjective:   Christian [name] is a 48 y.o. male patient who presented with suicidal ideation and complaints of depression.Marland Kitchen  HPI: 48 year old male with history of depression who presents to the ED with complaints of depression and suicidal ideation.  Patient states that he was planning to overdose on pills in his car.  But he was also considering shooting himself with a gun.  He does not currently own a gun ,he had to pawn his weapon due to financial stress.  Patient states that these feelings have been present for a while but have gotten worse in the context of recent argument with his mother.  Patient states that he lives with his mother and is financially dependent on her at this time.  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  When his mom began to question his financial contribution, patient began to feel depressed about his life situation feeling that he has spent 50 years of his life and has been unable to have anything to show for it.  Patient states that in the past he has been admitted to psychiatric hospitals but he has not taken any psychiatric medication.  He reports occasional drinking but denies any other substance use.  Past Psychiatric History: patient reports 2-3 prior hospitalizations.  States that they are normally in the context of suicidal ideation or suicide attempts.  Is unsure if he is ever taking psychotropic medication in the past.  Risk to Self:  Yes Risk to Others:  No Prior Inpatient Therapy:  Yes Prior Outpatient Therapy:  No  Past Medical History:  Past Medical History:  Diagnosis Date  . CAD (coronary artery  disease)    a. 07/2013 NSTEMI/PCI: LCX 10m (4.0x23 Xience DES), RCA 42m, EF > 55%;  b. 07/2013 Echo: EF 60-65%, diast dysfxn, mild LVH, mildly dil LA, nl RV size/fxn, nl RVSP.  Marland Kitchen History of tobacco abuse    a. quit ~ 2010  . Hyperlipidemia   . Morbid obesity ([place])    a. weighed 168 @ age 4.  . Scoliosis     Past Surgical History:  Procedure Laterality Date  . ADENOIDECTOMY    . CARDIAC CATHETERIZATION  07/2013   [facility]x1 stent   Family History:  Family History  Problem Relation Age of Onset  . Heart disease Mother   . Esophageal cancer Father    Family Psychiatric  History: Denies Social History:  Social History   Substance and Sexual Activity  Alcohol Use Yes     Social History   Substance and Sexual Activity  Drug Use No  . Types: Marijuana   Comment: past    Social History   Socioeconomic History  . Marital status: Single    Spouse name: Not on file  . Number of children: Not on file  . Years of education: Not on file  . Highest education level: Not on file  Occupational History  . Not on file  Social Needs  . Financial resource strain: Not on file  . Food insecurity    Worry: Not on file    Inability: Not on file  . Transportation needs    Medical: Not on file  Non-medical: Not on file  Tobacco Use  . Smoking status: Former Smoker    Packs/day: 0.50    Years: 20.00    Pack years: 10.00    Types: Cigarettes  . Smokeless tobacco: Never Used  Substance and Sexual Activity  . Alcohol use: Yes  . Drug use: No    Types: Marijuana    Comment: past  . Sexual activity: Not on file  Lifestyle  . Physical activity    Days per week: Not on file    Minutes per session: Not on file  . Stress: Not on file  Relationships  . Social Herbalist on phone: Not on file    Gets together: Not on file    Attends religious service: Not on file    Active member of club or organization: Not on file    Attends meetings of clubs or organizations: Not on  file    Relationship status: Not on file  Other Topics Concern  . Not on file  Social History Narrative  . Not on file   Additional Social History: Patient has been living with his mother.  He reports his sister is close by.  He reports he also has a son who lives locally.  Patient has held numerous jobs including working as a Airline pilot, Freight forwarder, Furniture conservator/restorer, and most recently Theme park manager.    Allergies:  No Known Allergies  Labs:  Results for orders placed or performed during the hospital encounter of 02/03/19 (from the past 48 hour(s))  CBC with Differential     Status: Abnormal   Collection Time: 02/03/19  1:44 PM  Result Value Ref Range   WBC 4.9 4.0 - 10.5 K/uL   RBC 4.34 4.22 - 5.81 MIL/uL   Hemoglobin 14.2 13.0 - 17.0 g/dL   HCT 39.0 39.0 - 52.0 %   MCV 89.9 80.0 - 100.0 fL   MCH 32.7 26.0 - 34.0 pg   MCHC 36.4 (H) 30.0 - 36.0 g/dL   RDW 12.9 11.5 - 15.5 %   Platelets 186 150 - 400 K/uL   nRBC 0.0 0.0 - 0.2 %   Neutrophils Relative % 48 %   Neutro Abs 2.4 1.7 - 7.7 K/uL   Lymphocytes Relative 34 %   Lymphs Abs 1.7 0.7 - 4.0 K/uL   Monocytes Relative 12 %   Monocytes Absolute 0.6 0.1 - 1.0 K/uL   Eosinophils Relative 3 %   Eosinophils Absolute 0.2 0.0 - 0.5 K/uL   Basophils Relative 2 %   Basophils Absolute 0.1 0.0 - 0.1 K/uL   Immature Granulocytes 1 %   Abs Immature Granulocytes 0.04 0.00 - 0.07 K/uL    Comment: Performed at [facility], [address] [place],  [address]  Comprehensive metabolic panel     Status: Abnormal   Collection Time: 02/03/19  1:44 PM  Result Value Ref Range   Sodium 137 135 - 145 mmol/L   Potassium 4.2 3.5 - 5.1 mmol/L   Chloride 97 (L) 98 - 111 mmol/L   CO2 29 22 - 32 mmol/L   Glucose, Bld 365 (H) 70 - 99 mg/dL   BUN 13 6 - 20 mg/dL   Creatinine, Ser 0.71 0.61 - 1.24 mg/dL   Calcium 9.4 8.9 - 10.3 mg/dL   Total Protein 7.7 6.5 - 8.1 g/dL   Albumin 4.4 3.5 - 5.0 g/dL   AST 16 15 - 41 U/L   ALT 17 0 -  44 U/L  Alkaline Phosphatase 65 38 - 126 U/L   Total Bilirubin 2.2 (H) 0.3 - 1.2 mg/dL   GFR calc non Af Amer >60 >60 mL/min   GFR calc Af Amer >60 >60 mL/min   Anion gap 11 5 - 15    Comment: Performed at [facility], [address] [place], [place] [address]  TSH     Status: None   Collection Time: 02/03/19  1:44 PM  Result Value Ref Range   TSH 1.665 0.350 - 4.500 uIU/mL    Comment: Performed by a 3rd Generation assay with a functional sensitivity of <=0.01 uIU/mL. Performed at [facility], [address] [place], [place] [address]   Ethanol     Status: None   Collection Time: 02/03/19  1:44 PM  Result Value Ref Range   Alcohol, Ethyl (B) <10 <10 mg/dL    Comment: (NOTE) Lowest detectable limit for serum alcohol is 10 mg/dL. For medical purposes only. Performed at [facility], [address] [place], [place] [address]   Urine Drug Screen, Qualitative [facility] only)     Status: None   Collection Time: 02/03/19  1:44 PM  Result Value Ref Range   Tricyclic, Ur Screen NONE DETECTED NONE DETECTED   Amphetamines, Ur Screen NONE DETECTED NONE DETECTED   MDMA (Ecstasy)Ur Screen NONE DETECTED NONE DETECTED   Cocaine Metabolite,Ur [place] NONE DETECTED NONE DETECTED   Opiate, Ur Screen NONE DETECTED NONE DETECTED   Phencyclidine (PCP) Ur S NONE DETECTED NONE DETECTED   Cannabinoid 50 Ng, Ur [place] NONE DETECTED NONE DETECTED   Barbiturates, Ur Screen NONE DETECTED NONE DETECTED   Benzodiazepine, Ur Scrn NONE DETECTED NONE DETECTED   Methadone Scn, Ur NONE DETECTED NONE DETECTED    Comment: (NOTE) Tricyclics + metabolites, urine    Cutoff 1000 ng/mL Amphetamines + metabolites, urine  Cutoff 1000 ng/mL MDMA (Ecstasy), urine              Cutoff 500 ng/mL Cocaine Metabolite, urine          Cutoff 300 ng/mL Opiate + metabolites, urine        Cutoff 300 ng/mL Phencyclidine (PCP), urine         Cutoff 25 ng/mL Cannabinoid, urine                 Cutoff 50  ng/mL Barbiturates + metabolites, urine  Cutoff 200 ng/mL Benzodiazepine, urine              Cutoff 200 ng/mL Methadone, urine                   Cutoff 300 ng/mL The urine drug screen provides only a preliminary, unconfirmed analytical test result and should not be used for non-medical purposes. Clinical consideration and professional judgment should be applied to any positive drug screen result due to possible interfering substances. A more specific alternate chemical method must be used in order to obtain a confirmed analytical result. Gas chromatography / mass spectrometry (GC/MS) is the preferred confirmat ory method. Performed at [facility], [address] [place],  [address]     Current Facility-Administered Medications  Medication Dose Route Frequency Provider Last Rate Last Dose  . metFORMIN (GLUCOPHAGE) tablet 500 mg  500 mg Oral BID WC Earleen Newport, MD   500 mg at 02/03/19 1533   Current Outpatient Medications  Medication Sig Dispense Refill  . aspirin 81 MG chewable tablet Chew 1 tablet (81 mg total) by mouth  daily. 30 tablet 1  . carvedilol (COREG) 25 MG tablet Take 1 tablet (25 mg total) by mouth 2 (two) times daily. 60 tablet 1  . FLUoxetine (PROZAC) 20 MG capsule Take 1 capsule (20 mg total) by mouth at bedtime. 30 capsule 1  . glipiZIDE (GLUCOTROL) 10 MG tablet Take 1 tablet (10 mg total) by mouth 2 (two) times daily. 60 tablet 1  . hydrOXYzine (ATARAX/VISTARIL) 50 MG tablet Take 1 tablet (50 mg total) by mouth 3 (three) times daily as needed for anxiety. 60 tablet 1  . insulin glargine (LANTUS) 100 UNIT/ML injection Inject 0.1 mLs (10 Units total) into the skin daily. 10 mL 1  . losartan (COZAAR) 100 MG tablet Take 1 tablet (100 mg total) by mouth daily. 30 tablet 1  . metFORMIN (GLUCOPHAGE) 1000 MG tablet Take 1 tablet (1,000 mg total) by mouth 2 (two) times daily with a meal. 60 tablet 1  . rosuvastatin (CRESTOR) 10 MG tablet Take 1  tablet (10 mg total) by mouth daily. 30 tablet 0  . traZODone (DESYREL) 100 MG tablet Take 1 tablet (100 mg total) by mouth at bedtime as needed for sleep. 30 tablet 1    Musculoskeletal: Strength & Muscle Tone: within normal limits Gait & Station: normal Patient leans: N/A  Psychiatric Specialty Exam: Physical Exam  Review of Systems  Constitutional: Negative.   HENT: Negative.   Skin: Negative.   Psychiatric/Behavioral: Positive for depression, substance abuse and suicidal ideas. Negative for hallucinations. The patient is nervous/anxious and has insomnia.     Blood pressure (!) 148/90, pulse 96, temperature 98.6 F (37 C), temperature source Oral, resp. rate 18, height 5\' 6"  (1.676 m), weight 74.8 kg, SpO2 97 %.Body mass index is 26.63 kg/m.  General Appearance: Casual  Eye Contact:  Good  Speech:  Slow  Volume:  Normal  Mood:  Depressed  Affect:  Depressed  Thought Process:  Coherent  Orientation:  Full (Time, Place, and Person)  Thought Content:  Logical  Suicidal Thoughts:  Yes.  with intent/plan  Homicidal Thoughts:  No  Memory:  Immediate;   Good  Judgement:  Fair  Insight:  Fair  Psychomotor Activity:  Normal  Concentration:  Concentration: Fair  Recall:  [place] of Knowledge:  Fair  Language:  Fair  Akathisia:  No  Handed:  Right  AIMS (if indicated):     Assets:  Communication Skills Desire for Improvement Housing Resilience  ADL's:  Intact  Cognition:  WNL  Sleep:        Treatment Plan Summary: 48 year old male with history of depression who presents in the context of social stressors with suicidal ideation and plan to overdose on pills.  Patient requires inpatient hospitalization for safety stabilization and medication management.  Medications: Will hold off on medications and defer to unit provider.  Diagnosis: Major depressive disorder  Disposition: Recommend psychiatric Inpatient admission when medically cleared.  Dixie Dials,  MD 02/03/2019 6:23 PM

## 2019-02-03 NOTE — BH Assessment (Signed)
Assessment Note  Christian [name] is an 48 y.o. male who presents to ED with increased depressive sxs and suicidal thoughts. Patient has a plan to overdose on pills and sit in his car until he dies. Per triage, pt reports "depressed and tired of living, states he slept in his car last night thinking of SI". Pt was also considering shooting himself with a gun. He does not currently own a gun, he had to pawn his weapon due to financial stress. Pt is currently unemployed and under a lot of stress due to job loss. He reports having a past attempt to overdose in the 1990s after his wife was having an extramarital affair. Patient reports past inpatient hospitalizations at [facility] in 05/2018 for a similar presentation as today. He currently lives with his mother and reports his living situation to be stressful. Patient denied HI/AVH. He also denied recent alcohol/drug use. Patient was alert and oriented x4 with a flat affect.   Diagnosis: Major Depressive Disorder  Past Medical History:  Past Medical History:  Diagnosis Date  . CAD (coronary artery disease)    a. 07/2013 NSTEMI/PCI: LCX 70m (4.0x23 Xience DES), RCA 55m, EF > 55%;  b. 07/2013 Echo: EF 60-65%, diast dysfxn, mild LVH, mildly dil LA, nl RV size/fxn, nl RVSP.  Marland Kitchen History of tobacco abuse    a. quit ~ 2010  . Hyperlipidemia   . Morbid obesity ([place])    a. weighed 168 @ age 22.  . Scoliosis     Past Surgical History:  Procedure Laterality Date  . ADENOIDECTOMY    . CARDIAC CATHETERIZATION  07/2013   [facility]'x1 stent    Family History:  Family History  Problem Relation Age of Onset  . Heart disease Mother   . Esophageal cancer Father     Social History:  reports that he has quit smoking. His smoking use included cigarettes. He has a 10.00 pack-year smoking history. He has never used smokeless tobacco. He reports current alcohol use. He reports that he does not use drugs.  Additional Social History:  Alcohol / Drug Use Pain Medications: See  MAR Prescriptions: See MAR Over the Counter: See MAR History of alcohol / drug use?: No history of alcohol / drug abuse  CIWA: CIWA-Ar BP: (!) 148/90 Pulse Rate: 96 COWS:    Allergies: No Known Allergies  Home Medications: (Not in a hospital admission)   OB/GYN Status:  No LMP for male patient.  General Assessment Data Location of Assessment: [facility] ED TTS Assessment: In system Is this a Tele or Face-to-Face Assessment?: Face-to-Face Is this an Initial Assessment or a Re-assessment for this encounter?: Initial Assessment Patient Accompanied by:: N/A Language Other than English: No Living Arrangements: Other (Comment)(Private Residence) What gender do you identify as?: Male Marital status: Single Maiden name: N/A Pregnancy Status: No Living Arrangements: Parent Can pt return to current living arrangement?: Yes Admission Status: Voluntary Is patient capable of signing voluntary admission?: Yes Referral Source: Self/Family/Friend Insurance type: None  Medical Screening Exam ([place]) Medical Exam completed: Yes  Crisis Care Plan Living Arrangements: Parent Legal Guardian: Other:(Self) Name of Psychiatrist: None Name of Therapist: None  Education Status Is patient currently in school?: No Is the patient employed, unemployed or receiving disability?: Unemployed  Risk to self with the past 6 months Suicidal Ideation: Yes-Currently Present Has patient been a risk to self within the past 6 months prior to admission? : No Suicidal Intent: Yes-Currently Present Has patient had any suicidal intent  within the past 6 months prior to admission? : No Is patient at risk for suicide?: Yes Suicidal Plan?: Yes-Currently Present Has patient had any suicidal plan within the past 6 months prior to admission? : No Specify Current Suicidal Plan: Pt plans to overdose on pills and sit in his car until he dies Access to Means: Yes Specify Access to Suicidal Means: Pt has access  to pills What has been your use of drugs/alcohol within the last 12 months?: Occasional alcohol Previous Attempts/Gestures: No How many times?: 1 Other Self Harm Risks: None Triggers for Past Attempts: None known Intentional Self Injurious Behavior: None Family Suicide History: Unknown Recent stressful life event(s): Conflict (Comment), Loss (Comment), Job Loss, Financial Problems(Conflict with mother) Persecutory voices/beliefs?: No Depression: Yes Depression Symptoms: Guilt, Fatigue, Isolating, Insomnia, Feeling worthless/self pity, Loss of interest in usual pleasures, Despondent Substance abuse history and/or treatment for substance abuse?: No Suicide prevention information given to non-admitted patients: Not applicable  Risk to Others within the past 6 months Homicidal Ideation: No Does patient have any lifetime risk of violence toward others beyond the six months prior to admission? : No Thoughts of Harm to Others: No Current Homicidal Intent: No Current Homicidal Plan: No Access to Homicidal Means: No Identified Victim: None History of harm to others?: No Assessment of Violence: None Noted Violent Behavior Description: None Does patient have access to weapons?: No Criminal Charges Pending?: No Does patient have a court date: No Is patient on probation?: No  Psychosis Hallucinations: None noted Delusions: None noted  Mental Status Report Appearance/Hygiene: In scrubs, In hospital gown Eye Contact: Good Motor Activity: Freedom of movement Speech: Logical/coherent Level of Consciousness: Alert Mood: Depressed Affect: Flat Anxiety Level: Minimal Thought Processes: Coherent, Relevant Judgement: Unimpaired Orientation: Person, Place, Time, Situation Obsessive Compulsive Thoughts/Behaviors: None  Cognitive Functioning Concentration: Normal Memory: Recent Intact, Remote Intact Is patient IDD: No Insight: Fair Impulse Control: Fair Appetite: Good Have you had any  weight changes? : No Change Sleep: Decreased Total Hours of Sleep: 4 Vegetative Symptoms: Staying in bed, Decreased grooming  ADLScreening [facility] Assessment Services) Patient's cognitive ability adequate to safely complete daily activities?: Yes Patient able to express need for assistance with ADLs?: Yes Independently performs ADLs?: Yes (appropriate for developmental age)  Prior Inpatient Therapy Prior Inpatient Therapy: Yes Prior Therapy Dates: Dates [facility] Prior Therapy Facilty/Provider(s): [facility] Reason for Treatment: Depression  Prior Outpatient Therapy Prior Outpatient Therapy: No Does patient have an ACCT team?: No Does patient have Intensive In-House Services?  : No Does patient have [facility] services? : No Does patient have P4CC services?: No  ADL Screening (condition at time of admission) Patient's cognitive ability adequate to safely complete daily activities?: Yes Patient able to express need for assistance with ADLs?: Yes Independently performs ADLs?: Yes (appropriate for developmental age)       Abuse/Neglect Assessment (Assessment to be complete while patient is alone) Abuse/Neglect Assessment Can Be Completed: Yes Physical Abuse: Denies Verbal Abuse: Denies Sexual Abuse: Denies Exploitation of patient/patient's resources: Denies Self-Neglect: Denies Values / Beliefs Cultural Requests During Hospitalization: None Spiritual Requests During Hospitalization: None Consults Spiritual Care Consult Needed: No Social Work Consult Needed: No Regulatory affairs officer (For Healthcare) Does Patient Have a Medical Advance Directive?: No Would patient like information on creating a medical advance directive?: No - Patient declined       Child/Adolescent Assessment Running Away Risk: (Patient is an adult)  Disposition:  Disposition Initial Assessment Completed for this Encounter: Yes Disposition of Patient: Admit Type  of inpatient treatment program: Adult Patient refused  recommended treatment: No Mode of transportation if patient is discharged/movement?: N/A Patient referred to: Other (Comment)([facility] BMU)  On Site Evaluation by:   Reviewed with Physician:    Frederich Cha 02/03/2019 6:54 PM

## 2019-02-03 NOTE — ED Notes (Signed)
Patient BG 460. Notified EDP. EDP ordered 10 units of Insulin and to encourage fluids.

## 2019-02-03 NOTE — ED Notes (Signed)
CBG checked: 460; Hewan, RN notified at this time.

## 2019-02-04 LAB — GLUCOSE, CAPILLARY
Glucose-Capillary: 369 mg/dL — ABNORMAL HIGH (ref 70–99)
Glucose-Capillary: 286 mg/dL — ABNORMAL HIGH (ref 70–99)
Glucose-Capillary: 230 mg/dL — ABNORMAL HIGH (ref 70–99)
Glucose-Capillary: 285 mg/dL — ABNORMAL HIGH (ref 70–99)
Glucose-Capillary: 266 mg/dL — ABNORMAL HIGH (ref 70–99)
Glucose-Capillary: 290 mg/dL — ABNORMAL HIGH (ref 70–99)

## 2019-02-04 NOTE — ED Notes (Signed)
Patient is calm and cooperative, states that He is still having Si, no plan at this time, He took po medications, compliant with all care, will continue to monitor, q 15 minute checks and camera surveillance in progress for safety.

## 2019-02-04 NOTE — ED Notes (Signed)
Hourly rounding reveals patient in room. No complaints, stable, in no acute distress. Q15 minute rounds and monitoring via Security Cameras to continue. 

## 2019-02-04 NOTE — ED Notes (Signed)
Patient sitting up against wall watching TV, patient aware he will be moved when covid result are back.

## 2019-02-04 NOTE — ED Notes (Signed)
Blood glucose 285, covered with sliding scale.

## 2019-02-04 NOTE — Progress Notes (Signed)
Inpatient Diabetes Program Recommendations  AACE/ADA: New Consensus Statement on Inpatient Glycemic Control (2015)  Target Ranges:  Prepandial:   less than 140 mg/dL      Peak postprandial:   less than 180 mg/dL (1-2 hours)      Critically ill patients:  140 - 180 mg/dL   Lab Results  Component Value Date   GLUCAP 286 (H) 02/04/2019   HGBA1C 9.2 (H) 05/30/2018   Review of Glycemic Control Results for RAMIZ, SPANNAGEL (MRN [id number]) as of 02/04/2019 08:41  Ref. Range 02/03/2019 20:03 02/03/2019 21:40 02/04/2019 02:16 02/04/2019 05:45  Glucose-Capillary Latest Ref Range: 70 - 99 mg/dL 460 (H) 397 (H) 369 (H) 286 (H)   Diabetes history: DM 2 Outpatient Diabetes medications: Metformin 1000 mg bid, Glipizide 10 mg bid, Lantus 10 units Current orders for Inpatient glycemic control:  Lantus 10 units, Novolog 0-15 units tid, Metformin 500 mg bid, Glipizide 10 mg bid  Inpatient Diabetes Program Recommendations:    Glucose 280's this am on home doses of DM medication. Consider increasing Lantus to 18 units.  A1c elevated, however is struggling with psychiatric barriers. Not appropriate to address A1c at this time.  Thanks,  Tama Headings RN, MSN, BC-ADM Inpatient Diabetes Coordinator Team Pager [phone] (8a-5p)

## 2019-02-04 NOTE — ED Notes (Signed)
CBG rechecked: 369; Hewan, RN notified. Will continue to monitor Q15 minute rounds. No other needs voiced at this time.

## 2019-02-04 NOTE — ED Notes (Signed)
Patient resting comfortably. covid results pending for transfer to lower lower BMU

## 2019-02-04 NOTE — ED Notes (Signed)
Lunch provided.

## 2019-02-04 NOTE — ED Provider Notes (Signed)
-----------------------------------------   6:32 AM on 02/04/2019 -----------------------------------------   Blood pressure 128/82, pulse 74, temperature 98.7 F (37.1 C), temperature source Oral, resp. rate 16, height 5\' 6"  (1.676 m), weight 74.8 kg, SpO2 100 %.  The patient is sleeping at this time.  There have been no acute events since the last update.  Awaiting disposition plan from Behavioral Medicine and/or Social Work team(s).   Paulette Blanch, MD 02/04/19 [phone]

## 2019-02-04 NOTE — ED Notes (Signed)
Patient ate 100% of his breakfast and beverage.

## 2019-02-04 NOTE — ED Notes (Addendum)
CBG rechecked: 286; Hewan, RN notified. Will continue to monitor Q15 minute rounds. No other needs voiced at this time.

## 2019-02-04 NOTE — ED Notes (Signed)
VOL/ Being admitted to [facility]

## 2019-02-05 ENCOUNTER — Other Ambulatory Visit: Payer: Self-pay

## 2019-02-05 ENCOUNTER — Inpatient Hospital Stay
Admission: AD | Admit: 2019-02-05 | Discharge: 2019-02-10 | DRG: 885 | Disposition: A | Discharge status: Home / Self Care | Payer: Medicaid Other | Source: Intra-hospital | Attending: Psychiatry | Admitting: Psychiatry

## 2019-02-05 DIAGNOSIS — Z915 Personal history of self-harm: Secondary | ICD-10-CM

## 2019-02-05 DIAGNOSIS — M419 Scoliosis, unspecified: Secondary | ICD-10-CM | POA: Diagnosis present

## 2019-02-05 DIAGNOSIS — Z8249 Family history of ischemic heart disease and other diseases of the circulatory system: Secondary | ICD-10-CM | POA: Diagnosis not present

## 2019-02-05 DIAGNOSIS — Z800 Family history of malignant neoplasm of digestive organs: Secondary | ICD-10-CM | POA: Diagnosis not present

## 2019-02-05 DIAGNOSIS — G4700 Insomnia, unspecified: Secondary | ICD-10-CM | POA: Diagnosis present

## 2019-02-05 DIAGNOSIS — F329 Major depressive disorder, single episode, unspecified: Secondary | ICD-10-CM | POA: Diagnosis present

## 2019-02-05 DIAGNOSIS — E785 Hyperlipidemia, unspecified: Secondary | ICD-10-CM | POA: Diagnosis present

## 2019-02-05 DIAGNOSIS — E119 Type 2 diabetes mellitus without complications: Secondary | ICD-10-CM | POA: Diagnosis present

## 2019-02-05 DIAGNOSIS — I10 Essential (primary) hypertension: Secondary | ICD-10-CM | POA: Diagnosis present

## 2019-02-05 DIAGNOSIS — R45851 Suicidal ideations: Secondary | ICD-10-CM | POA: Diagnosis present

## 2019-02-05 DIAGNOSIS — F332 Major depressive disorder, recurrent severe without psychotic features: Principal | ICD-10-CM | POA: Diagnosis present

## 2019-02-05 DIAGNOSIS — I2510 Atherosclerotic heart disease of native coronary artery without angina pectoris: Secondary | ICD-10-CM | POA: Diagnosis present

## 2019-02-05 DIAGNOSIS — I252 Old myocardial infarction: Secondary | ICD-10-CM | POA: Diagnosis not present

## 2019-02-05 LAB — SARS CORONAVIRUS 2 (TAT 6-24 HRS): SARS Coronavirus 2: NEGATIVE

## 2019-02-05 LAB — HEPATIC FUNCTION PANEL
ALT: 16 U/L (ref 0–44)
AST: 18 U/L (ref 15–41)
Albumin: 4.1 g/dL (ref 3.5–5.0)
Alkaline Phosphatase: 60 U/L (ref 38–126)
Bilirubin, Direct: 0.2 mg/dL (ref 0.0–0.2)
Indirect Bilirubin: 0.9 mg/dL (ref 0.3–0.9)
Total Bilirubin: 1.1 mg/dL (ref 0.3–1.2)
Total Protein: 7 g/dL (ref 6.5–8.1)

## 2019-02-05 LAB — LIPID PANEL
Cholesterol: 243 mg/dL — ABNORMAL HIGH (ref 0–200)
HDL: 55 mg/dL (ref >40)
LDL Cholesterol: 141 mg/dL — ABNORMAL HIGH (ref 0–99)
Total CHOL/HDL Ratio: 4.4 RATIO
Triglycerides: 234 mg/dL — ABNORMAL HIGH (ref <150)
VLDL: 47 mg/dL — ABNORMAL HIGH (ref 0–40)

## 2019-02-05 LAB — HEMOGLOBIN A1C
Hgb A1c MFr Bld: 9.4 % — ABNORMAL HIGH (ref 4.8–5.6)
Mean Plasma Glucose: 223.08 mg/dL

## 2019-02-05 LAB — GLUCOSE, CAPILLARY
Glucose-Capillary: 190 mg/dL — ABNORMAL HIGH (ref 70–99)
Glucose-Capillary: 214 mg/dL — ABNORMAL HIGH (ref 70–99)
Glucose-Capillary: 213 mg/dL — ABNORMAL HIGH (ref 70–99)

## 2019-02-05 LAB — SARS CORONAVIRUS 2 BY RT PCR (HOSPITAL ORDER, PERFORMED IN ~~LOC~~ HOSPITAL LAB): SARS Coronavirus 2: NEGATIVE

## 2019-02-05 LAB — TSH: TSH: 2.838 u[IU]/mL (ref 0.350–4.500)

## 2019-02-05 MED ORDER — METFORMIN HCL 500 MG PO TABS
1000.0000 mg | ORAL_TABLET | Freq: Two times a day (BID) | ORAL | Status: DC
  Administered 2019-02-05 – 2019-02-10 (×10): 1000 mg via ORAL
  Filled 2019-02-05 (×10): qty 2

## 2019-02-05 MED ORDER — ASPIRIN 81 MG PO CHEW
81.0000 mg | CHEWABLE_TABLET | Freq: Every day | ORAL | Status: DC
  Administered 2019-02-05 – 2019-02-10 (×6): 81 mg via ORAL
  Filled 2019-02-05 (×6): qty 1

## 2019-02-05 MED ORDER — MAGNESIUM HYDROXIDE 400 MG/5ML PO SUSP: 30.0000 mL | Freq: Every day | ORAL | Status: DC | PRN

## 2019-02-05 MED ORDER — INSULIN ASPART 100 UNIT/ML ~~LOC~~ SOLN
0.0000 [IU] | Freq: Three times a day (TID) | SUBCUTANEOUS | Status: DC
  Administered 2019-02-05 – 2019-02-06 (×2): 5 [IU] via SUBCUTANEOUS
  Administered 2019-02-06: 8 [IU] via SUBCUTANEOUS
  Administered 2019-02-06: 5 [IU] via SUBCUTANEOUS
  Administered 2019-02-07: 8 [IU] via SUBCUTANEOUS
  Administered 2019-02-07: 3 [IU] via SUBCUTANEOUS
  Administered 2019-02-07: 5 [IU] via SUBCUTANEOUS
  Administered 2019-02-08: 3 [IU] via SUBCUTANEOUS
  Administered 2019-02-08: 5 [IU] via SUBCUTANEOUS
  Administered 2019-02-08: 3 [IU] via SUBCUTANEOUS
  Administered 2019-02-09: 8 [IU] via SUBCUTANEOUS
  Administered 2019-02-09: 3 [IU] via SUBCUTANEOUS
  Administered 2019-02-09: 8 [IU] via SUBCUTANEOUS
  Administered 2019-02-10: 2 [IU] via SUBCUTANEOUS
  Filled 2019-02-05 (×14): qty 1

## 2019-02-05 MED ORDER — METFORMIN HCL 500 MG PO TABS
500.0000 mg | ORAL_TABLET | Freq: Two times a day (BID) | ORAL | Status: DC
  Administered 2019-02-05: 500 mg via ORAL
  Filled 2019-02-05: qty 1

## 2019-02-05 MED ORDER — ACETAMINOPHEN 325 MG PO TABS: 650.0000 mg | ORAL_TABLET | Freq: Four times a day (QID) | ORAL | Status: DC | PRN

## 2019-02-05 MED ORDER — MIRTAZAPINE 15 MG PO TABS
15.0000 mg | ORAL_TABLET | Freq: Every day | ORAL | Status: DC
  Administered 2019-02-05 – 2019-02-07 (×3): 15 mg via ORAL
  Filled 2019-02-05 (×3): qty 1

## 2019-02-05 MED ORDER — ALUM & MAG HYDROXIDE-SIMETH 200-200-20 MG/5ML PO SUSP: 30.0000 mL | ORAL | Status: DC | PRN

## 2019-02-05 NOTE — ED Notes (Signed)
Per [facility], lab.  The covid test was run, but never resulted.  [facility] lab will find the tube and have to re-run it.  Most likely will not happen tonight.

## 2019-02-05 NOTE — Tx Team (Signed)
pInitial Treatment Plan 02/05/2019 3:11 AM Christian [name] [id number]    PATIENT STRESSORS: Financial difficulties Marital or family conflict Medication change or noncompliance Occupational concerns   PATIENT STRENGTHS: Capable of independent living Armed forces logistics/support/administrative officer Motivation for treatment/growth Supportive family/friends   PATIENT IDENTIFIED PROBLEMS:   Depression/Anxiety    Suicidal Thoughts    Lack Coping Mechanisms            DISCHARGE CRITERIA:  Adequate post-discharge living arrangements Improved stabilization in mood, thinking, and/or behavior Medical problems require only outpatient monitoring Need for constant or close observation no longer present  PRELIMINARY DISCHARGE PLAN: Attend PHP/IOP Outpatient therapy Participate in family therapy Return to previous living arrangement  PATIENT/FAMILY INVOLVEMENT: This treatment plan has been presented to and reviewed with the patient, Christian [name],   The patient  have been given the opportunity to ask questions and make suggestions.  Clemens Catholic, RN 02/05/2019, 3:11 AM

## 2019-02-05 NOTE — ED Notes (Signed)
BEHAVIORAL HEALTH ROUNDING Patient sleeping: Yes.   Patient alert and oriented: eyes closed  Appears asleep Behavior appropriate: Yes.  ; If no, describe:  Nutrition and fluids offered: Yes  Toileting and hygiene offered: sleeping Sitter present: q 15 minute observations and security camera monitoring  

## 2019-02-05 NOTE — BHH Suicide Risk Assessment (Signed)
[place] INPATIENT:  Family/Significant Other Suicide Prevention Education  Suicide Prevention Education:  Education Completed; Larina Earthly, sister, [phone] has been identified by the patient as the family member/significant other with whom the patient will be residing, and identified as the person(s) who will aid the patient in the event of a mental health crisis (suicidal ideations/suicide attempt).  With written consent from the patient, the family member/significant other has been provided the following suicide prevention education, prior to the and/or following the discharge of the patient.  The suicide prevention education provided includes the following:  Suicide risk factors  Suicide prevention and interventions  National Suicide Hotline telephone number  [facility] assessment telephone number  [facility] Emergency Assistance [place] and/or Residential Mobile Crisis Unit telephone number  Request made of family/significant other to:  Remove weapons (e.g., guns, rifles, knives), all items previously/currently identified as safety concern.    Remove drugs/medications (over-the-counter, prescriptions, illicit drugs), all items previously/currently identified as a safety concern.  The family member/significant other verbalizes understanding of the suicide prevention education information provided.  The family member/significant other agrees to remove the items of safety concern listed above.  Sister reports "I think he's been drinking and he has depression and all that just caught up with him."  She reports "he lives with my mom who has dementia and he just can't let that roll of his back."  She was unable to confirm if the patient had pawned the rifle as previously reported.   Rozann Lesches 02/05/2019, 10:25 AM

## 2019-02-05 NOTE — Plan of Care (Signed)
Patient newly admitted, hasn't had time to progress  Problem: Education: Goal: Knowledge of [place] [facility] Education information/materials will improve Outcome: Not Progressing Goal: Emotional status will improve Outcome: Not Progressing Goal: Mental status will improve Outcome: Not Progressing Goal: Verbalization of understanding the information provided will improve Outcome: Not Progressing   Problem: Safety: Goal: Periods of time without injury will increase Outcome: Not Progressing   Problem: Education: Goal: Ability to make informed decisions regarding treatment will improve Outcome: Not Progressing   Problem: Coping: Goal: Coping ability will improve Outcome: Not Progressing

## 2019-02-05 NOTE — ED Notes (Signed)

## 2019-02-05 NOTE — ED Notes (Signed)
BEHAVIORAL HEALTH ROUNDING Patient sleeping: No. Patient alert and oriented: yes Behavior appropriate: Yes.  ; If no, describe:  Nutrition and fluids offered: yes Toileting and hygiene offered: Yes  Sitter present: q15 minute observations and security camera monitoring   

## 2019-02-05 NOTE — ED Notes (Signed)
ED BHU [place] Is the patient under IVC or is there intent for IVC: voluntary  Is the patient medically cleared: Yes.   Is there vacancy in the ED BHU: Yes.   Is the population mix appropriate for patient: Yes.   Is the patient awaiting placement in inpatient or outpatient setting: Yes.  Awaiting covid test results so that he may go to [facility] room 322 Has the patient had a psychiatric consult: Yes.   Survey of unit performed for contraband, proper placement and condition of furniture, tampering with fixtures in bathroom, shower, and each patient room: Yes.  ; Findings:  APPEARANCE/BEHAVIOR Calm and cooperative NEURO ASSESSMENT Orientation: oriented x3  Denies pain Hallucinations: No.None noted (Hallucinations) denies  Speech: Normal Gait: normal RESPIRATORY ASSESSMENT Even  Unlabored respirations  CARDIOVASCULAR ASSESSMENT Pulses equal   regular rate  Skin warm and dry   GASTROINTESTINAL ASSESSMENT no GI complaint EXTREMITIES Full ROM  PLAN OF CARE Provide calm/safe environment. Vital signs assessed twice daily. ED BHU Assessment once each 12-hour shift. Collaborate with TTS daily or as condition indicates. Assure the ED provider has rounded once each shift. Provide and encourage hygiene. Provide redirection as needed. Assess for escalating behavior; address immediately and inform ED provider.  Assess family dynamic and appropriateness for visitation as needed: Yes.  ; If necessary, describe findings:  Educate the patient/family about BHU procedures/visitation: Yes.  ; If necessary, describe findings:

## 2019-02-05 NOTE — ED Notes (Signed)
Lab called re: Covid results.  Lab called [facility] and they received specimen at 1030pm last night.  They are looking into results and will call our lab back when they find out why its taking so long.

## 2019-02-05 NOTE — Progress Notes (Signed)
Recreation Therapy Notes    Date: 02/05/2019  Time: 9:30 am  Location: Craft room  Behavioral response: Appropriate   Intervention Topic: Self-esteem   Discussion/Intervention:   Group content today was focused on self-esteem. Patient defined self-esteem and where it comes form. The group described reasons self-esteem is important. Individuals stated things that impact self-esteem and positive ways to improve self-esteem. The group participated in the intervention "Exploring self-esteem" where patients were able to create a collage of positive things that makes them who they are. Clinical Observations/Feedback:  Patient came to group late and was pulled out by peer support.  Individual returned to group and was social with peers and staff while participating in group.  Treanna Dumler LRT/CTRS         Kevonta Phariss 02/05/2019 11:30 AM

## 2019-02-05 NOTE — ED Notes (Signed)
Pt observed lying in bed - watching TV   Pt visualized with NAD  No verbalized needs or concerns at this time  Continue to monitor

## 2019-02-05 NOTE — BHH Suicide Risk Assessment (Signed)
[name] Admission Suicide Risk Assessment   Nursing information obtained from:  Patient Demographic factors:  Male, Caucasian Current Mental Status:  Suicidal ideation indicated by patient, Suicide plan Loss Factors:  NA Historical Factors:  Prior suicide attempts Risk Reduction Factors:  Positive coping skills or problem solving skills, Living with another person, especially a relative, Sense of responsibility to family  Total Time spent with patient: 1 hour Principal Problem: Severe episode of recurrent major depressive disorder, without psychotic features ([place]) Diagnosis:  Principal Problem:   Severe episode of recurrent major depressive disorder, without psychotic features ([place]) Active Problems:   HTN (hypertension)   Diabetes mellitus, type II ([place])   Suicidal ideation   Major depression  Subjective Data: Patient seen and chart reviewed.  This is a patient with a history of recurrent depression and a history of alcohol abuse who presented to the hospital with multiple symptoms of depression and acute suicidal ideation.  On interview today he is not reporting current suicidal intent and he is not showing signs of psychosis.  He is indicating positive plans for the future and is cooperative with treatment.  Continued Clinical Symptoms:  Alcohol Use Disorder Identification Test Final Score (AUDIT): 0 The "Alcohol Use Disorders Identification Test", Guidelines for Use in Primary Care, Second Edition.  World Pharmacologist [facility]). Score between 0-7:  no or low risk or alcohol related problems. Score between 8-15:  moderate risk of alcohol related problems. Score between 16-19:  high risk of alcohol related problems. Score 20 or above:  warrants further diagnostic evaluation for alcohol dependence and treatment.   CLINICAL FACTORS:   Depression:   Insomnia   Musculoskeletal: Strength & Muscle Tone: within normal limits Gait & Station: normal Patient leans: N/A  Psychiatric  Specialty Exam: Physical Exam  Nursing note and vitals reviewed. Constitutional: He appears well-developed and well-nourished.  HENT:  Head: Normocephalic and atraumatic.  Eyes: Pupils are equal, round, and reactive to light. Conjunctivae are normal.  Neck: Normal range of motion.  Cardiovascular: Regular rhythm and normal heart sounds.  Respiratory: Effort normal. No respiratory distress.  GI: Soft.  Musculoskeletal: Normal range of motion.  Neurological: He is alert.  Skin: Skin is warm and dry.  Psychiatric: His affect is blunt. His speech is delayed. He is slowed. Cognition and memory are impaired. He expresses impulsivity. He exhibits a depressed mood. He expresses suicidal ideation. He expresses no suicidal plans.    Review of Systems  Constitutional: Negative.   HENT: Negative.   Eyes: Negative.   Respiratory: Negative.   Cardiovascular: Negative.   Gastrointestinal: Negative.   Musculoskeletal: Negative.   Skin: Negative.   Neurological: Negative.   Psychiatric/Behavioral: Positive for depression and suicidal ideas. Negative for hallucinations and substance abuse. The patient is nervous/anxious and has insomnia.     Blood pressure (!) 137/92, pulse 76, temperature 98.6 F (37 C), temperature source Oral, resp. rate 16, height 5\' 6"  (1.676 m), weight 74.8 kg, SpO2 100 %.Body mass index is 26.62 kg/m.  General Appearance: Casual  Eye Contact:  Good  Speech:  Slow  Volume:  Normal  Mood:  Depressed  Affect:  Depressed  Thought Process:  Coherent  Orientation:  Full (Time, Place, and Person)  Thought Content:  Logical  Suicidal Thoughts:  Yes.  without intent/plan  Homicidal Thoughts:  No  Memory:  Immediate;   Fair Recent;   Fair Remote;   Fair  Judgement:  Fair  Insight:  Fair  Psychomotor Activity:  Normal  Concentration:  Concentration: Fair  Recall:  [employer] of Knowledge:  Fair  Language:  Fair  Akathisia:  No  Handed:  Right  AIMS (if indicated):      Assets:  Desire for Improvement Housing Resilience Social Support  ADL's:  Intact  Cognition:  WNL  Sleep:  Number of Hours: 7      COGNITIVE FEATURES THAT CONTRIBUTE TO RISK:  None    SUICIDE RISK:   Mild:  Suicidal ideation of limited frequency, intensity, duration, and specificity.  There are no identifiable plans, no associated intent, mild dysphoria and related symptoms, good self-control (both objective and subjective assessment), few other risk factors, and identifiable protective factors, including available and accessible social support.  PLAN OF CARE: Continue 15-minute checks.  Engage in individual and group therapy and assessment.  He is already working with [facility] on arranging appropriate discharge planning.  Restart antidepressant medicine.  Try to get his blood sugars and health under control.  Reassess suicidality prior to discharge  I certify that inpatient services furnished can reasonably be expected to improve the patient's condition.   Alethia Berthold, MD 02/05/2019, 1:09 PM

## 2019-02-05 NOTE — Plan of Care (Signed)
D: Pt. During assessments is pleasant and cooperative, engagement is fair. Pt. Denies HI/AVH, but endorses suicidal thoughts. Pt. Denies any active intent and/or plan, and verbalizes contract for safety on the unit. Pt. Endorses a depressed and anxious mood. Pt. Expresses family stress at home. Pt. Denies physical pain. Pt. Affect mostly appropriate and/or flat.   A: Q x 15 minute observation checks in place for safety. Patient was and is provided with education throughout shift.  Patient was and will be given/offered medications per orders. Patient was and is encouraged to attend groups, participate in unit activities and continue with plan of care. Pt. Chart and plans of care reviewed. Pt. Given support and encouragement when appropriate.    R: Patient is complaint with medication and unit procedures. Pt. Blood sugars monitored for safety. Pt. Observed eating fair. Pt. Group attendance is fair. No behavioral concerns to report.     Problem: Education: Goal: Knowledge of [facility] Education information/materials will improve Outcome: Progressing Goal: Emotional status will improve Outcome: Not Progressing Goal: Mental status will improve Outcome: Not Progressing   Problem: Safety: Goal: Periods of time without injury will increase Outcome: Progressing

## 2019-02-05 NOTE — H&P (Signed)
Psychiatric Admission Assessment Adult  Patient Identification: Christian [name] MRN:  [id number] Date of Evaluation:  02/05/2019 Chief Complaint:  MDD Principal Diagnosis: Severe episode of recurrent major depressive disorder, without psychotic features ([place]) Diagnosis:  Principal Problem:   Severe episode of recurrent major depressive disorder, without psychotic features ([place]) Active Problems:   HTN (hypertension)   Diabetes mellitus, type II ([place])   Suicidal ideation   Major depression  History of Present Illness: Patient seen chart reviewed.  Patient known from previous encounters.  This gentleman with a history of depression presented to the hospital reporting multiple symptoms of depression along with suicidal ideation.  On interview today the patient indicates that his mood stays down and negative much of the time.  He has very poor self-esteem and frequent feelings of hopelessness and fatigue.  The major life stressor is his continued living with his mother.  She is getting more demented and has her own multiple medical problems.  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  Patient has frequent trouble sleeping with racing thoughts at night.  He says that the last straw that brought him in the hospital was a argument with his mother and not unlike the arguments they frequently had in which he felt he was trying to do his best but she was still berating him.  He felt like he could not stand it anymore and considered taking an overdose of sleeping pills but then thought about how nobody else would take care of his dog.  He says this was his only reason for changing his mind about suicide.  He is not having any psychotic symptoms.  Contrary to previous presentations he says that he is no longer drinking heavily.  He says he drinks only 1 or 2 times a month.  Denies using any other drugs.  He is not getting any outpatient  psychiatric treatment or following up with any medication or mental health care.  Sounds like he has been neglecting his medical issues as well.` Associated Signs/Symptoms: Depression Symptoms:  depressed mood, anhedonia, insomnia, feelings of worthlessness/guilt, difficulty concentrating, hopelessness, suicidal thoughts with specific plan, (Hypo) Manic Symptoms:  None reported Anxiety Symptoms:  Excessive Worry, Psychotic Symptoms:  None PTSD Symptoms: Negative Total Time spent with patient: 1 hour  Past Psychiatric History: Patient has had at least 2 previous hospitalizations for psychiatric problems.  He was here in March of this year with similar situation except at that time he was drinking heavily on a daily basis.  He did follow-up with [facility] for intensive outpatient treatment after his last hospitalization but eventually dropped out of treatment.  He has had another hospitalization a few years ago also for a suicide attempt.  Never has stayed compliant with antidepressants long enough to know if they are helpful.  Is the patient at risk to self? Yes.    Has the patient been a risk to self in the past 6 months? Yes.    Has the patient been a risk to self within the distant past? Yes.    Is the patient a risk to others? No.  Has the patient been a risk to others in the past 6 months? No.  Has the patient been a risk to others within the distant past? No.   Prior Inpatient Therapy:   Prior Outpatient Therapy:    Alcohol Screening: 1. How often do you have a drink containing alcohol?:  Never 2. How many drinks containing alcohol do you have on a typical day when you are drinking?: 1 or 2 3. How often do you have six or more drinks on one occasion?: Never AUDIT-C Score: 0 4. How often during the last year have you found that you were not able to stop drinking once you had started?: Never 5. How often during the last year have you failed to do what was normally expected from you  becasue of drinking?: Never 6. How often during the last year have you needed a first drink in the morning to get yourself going after a heavy drinking session?: Never 7. How often during the last year have you had a feeling of guilt of remorse after drinking?: Never 8. How often during the last year have you been unable to remember what happened the night before because you had been drinking?: Never 9. Have you or someone else been injured as a result of your drinking?: No 10. Has a relative or friend or a doctor or another health worker been concerned about your drinking or suggested you cut down?: No Alcohol Use Disorder Identification Test Final Score (AUDIT): 0 Alcohol Brief Interventions/Follow-up: AUDIT Score <7 follow-up not indicated Substance Abuse History in the last 12 months:  Yes.   Consequences of Substance Abuse: Medical Consequences:  They were really causing him and be something of a wreck with a lot of weakness and some withdrawal but it sounds like now he is cut back enough that his health is improving they were probably making his diabetes at high blood pressure worse to Previous Psychotropic Medications: Yes  Psychological Evaluations: Yes  Past Medical History:  Past Medical History:  Diagnosis Date  . CAD (coronary artery disease)    a. 07/2013 NSTEMI/PCI: LCX 64m (4.0x23 Xience DES), RCA 62m, EF > 55%;  b. 07/2013 Echo: EF 60-65%, diast dysfxn, mild LVH, mildly dil LA, nl RV size/fxn, nl RVSP.  Marland Kitchen History of tobacco abuse    a. quit ~ 2010  . Hyperlipidemia   . Morbid obesity ([place])    a. weighed 168 @ age 69.  . Scoliosis     Past Surgical History:  Procedure Laterality Date  . ADENOIDECTOMY    . CARDIAC CATHETERIZATION  07/2013   [facility]'x1 stent   Family History:  Family History  Problem Relation Age of Onset  . Heart disease Mother   . Esophageal cancer Father    Family Psychiatric  History: None reported Tobacco Screening: Have you used any form of tobacco  in the last 30 days? (Cigarettes, Smokeless Tobacco, Cigars, and/or Pipes): No Social History:  Social History   Substance and Sexual Activity  Alcohol Use Yes     Social History   Substance and Sexual Activity  Drug Use No  . Types: Marijuana   Comment: past    Additional Social History: Marital status: Single Are you sexually active?: No What is your sexual orientation?: heterosexual Has your sexual activity been affected by drugs, alcohol, medication, or emotional stress?: no Does patient have children?: Yes How many children?: 2 How is patient's relationship with their children?: Pt reports that his relationship with his daughter is improving.                         Allergies:  No Known Allergies Lab Results:  Results for orders placed or performed during the hospital encounter of 02/05/19 (from the past 48 hour(s))  Glucose, capillary  Status: Abnormal   Collection Time: 02/05/19  7:06 AM  Result Value Ref Range   Glucose-Capillary 190 (H) 70 - 99 mg/dL   Comment 1 Notify RN   Hepatic function panel     Status: None   Collection Time: 02/05/19  7:20 AM  Result Value Ref Range   Total Protein 7.0 6.5 - 8.1 g/dL   Albumin 4.1 3.5 - 5.0 g/dL   AST 18 15 - 41 U/L   ALT 16 0 - 44 U/L   Alkaline Phosphatase 60 38 - 126 U/L   Total Bilirubin 1.1 0.3 - 1.2 mg/dL   Bilirubin, Direct 0.2 0.0 - 0.2 mg/dL   Indirect Bilirubin 0.9 0.3 - 0.9 mg/dL    Comment: Performed at [facility], [address] [place], [place] [address]  Lipid panel     Status: Abnormal   Collection Time: 02/05/19  7:20 AM  Result Value Ref Range   Cholesterol 243 (H) 0 - 200 mg/dL   Triglycerides 234 (H) <150 mg/dL   HDL 55 >40 mg/dL   Total CHOL/HDL Ratio 4.4 RATIO   VLDL 47 (H) 0 - 40 mg/dL   LDL Cholesterol 141 (H) 0 - 99 mg/dL    Comment:        Total Cholesterol/HDL:CHD Risk Coronary Heart Disease Risk Table                     Men   Women  1/2 Average Risk   3.4    3.3  Average Risk       5.0   4.4  2 X Average Risk   9.6   7.1  3 X Average Risk  23.4   11.0        Use the calculated Patient Ratio above and the CHD Risk Table to determine the patient's CHD Risk.        ATP III CLASSIFICATION (LDL):  <100     mg/dL   Optimal  100-129  mg/dL   Near or Above                    Optimal  130-159  mg/dL   Borderline  160-189  mg/dL   High  >190     mg/dL   Very High Performed at [facility], [address] [place], [place] [address]   TSH     Status: None   Collection Time: 02/05/19  7:20 AM  Result Value Ref Range   TSH 2.838 0.350 - 4.500 uIU/mL    Comment: Performed by a 3rd Generation assay with a functional sensitivity of <=0.01 uIU/mL. Performed at [facility], [address] [place], [place] [address]     Blood Alcohol level:  Lab Results  Component Value Date   [facility] <10 02/03/2019   ETH <10 [id number]    Metabolic Disorder Labs:  Lab Results  Component Value Date   HGBA1C 9.2 (H) 05/30/2018   MPG 217.34 05/30/2018   MPG 228.82 05/29/2018   No results found for: PROLACTIN Lab Results  Component Value Date   CHOL 243 (H) 02/05/2019   TRIG 234 (H) 02/05/2019   HDL 55 02/05/2019   CHOLHDL 4.4 02/05/2019   VLDL 47 (H) 02/05/2019   LDLCALC 141 (H) 02/05/2019   LDLCALC UNABLE TO CALCULATE IF TRIGLYCERIDE OVER 400 mg/dL 05/30/2018    Current Medications: Current Facility-Administered Medications  Medication Dose Route Frequency Provider Last Rate Last Dose  . acetaminophen (  TYLENOL) tablet 650 mg  650 mg Oral Q6H PRN Cristofano, Dorene Ar, MD      . alum & mag hydroxide-simeth (MAALOX/MYLANTA) 200-200-20 MG/5ML suspension 30 mL  30 mL Oral Q4H PRN Cristofano, Paul A, MD      . aspirin chewable tablet 81 mg  81 mg Oral Daily Cristofano, Dorene Ar, MD   81 mg at 02/05/19 0754  . insulin aspart (novoLOG) injection 0-15 Units  0-15 Units Subcutaneous TID WC Rorie Delmore T, MD      . magnesium hydroxide (MILK  OF MAGNESIA) suspension 30 mL  30 mL Oral Daily PRN Cristofano, Paul A, MD      . metFORMIN (GLUCOPHAGE) tablet 1,000 mg  1,000 mg Oral BID WC Jaquille Kau T, MD      . mirtazapine (REMERON) tablet 15 mg  15 mg Oral QHS Davine Coba T, MD       PTA Medications: No medications prior to admission.    Musculoskeletal: Strength & Muscle Tone: within normal limits Gait & Station: normal Patient leans: N/A  Psychiatric Specialty Exam: Physical Exam  Nursing note and vitals reviewed. Constitutional: He appears well-developed and well-nourished.  HENT:  Head: Normocephalic and atraumatic.  Eyes: Pupils are equal, round, and reactive to light. Conjunctivae are normal.  Neck: Normal range of motion.  Cardiovascular: Regular rhythm and normal heart sounds.  Respiratory: Effort normal. No respiratory distress.  GI: Soft.  Musculoskeletal: Normal range of motion.  Neurological: He is alert.  Skin: Skin is warm and dry.  Psychiatric: Judgment normal. His speech is delayed. He is slowed. Cognition and memory are normal. He exhibits a depressed mood. He expresses suicidal ideation. He expresses no suicidal plans.    Review of Systems  Constitutional: Negative.   HENT: Negative.   Eyes: Negative.   Respiratory: Negative.   Cardiovascular: Negative.   Gastrointestinal: Negative.   Musculoskeletal: Negative.   Skin: Negative.   Neurological: Negative.   Psychiatric/Behavioral: Positive for depression and suicidal ideas. Negative for hallucinations and substance abuse. The patient is nervous/anxious and has insomnia.     Blood pressure (!) 137/92, pulse 76, temperature 98.6 F (37 C), temperature source Oral, resp. rate 16, height 5\' 6"  (1.676 m), weight 74.8 kg, SpO2 100 %.Body mass index is 26.62 kg/m.  General Appearance: Casual  Eye Contact:  Good  Speech:  Clear and Coherent  Volume:  Normal  Mood:  Depressed  Affect:  Depressed  Thought Process:  Coherent  Orientation:  Full  (Time, Place, and Person)  Thought Content:  Logical  Suicidal Thoughts:  Yes.  without intent/plan  Homicidal Thoughts:  No  Memory:  Immediate;   Fair Recent;   Fair Remote;   Fair  Judgement:  Fair  Insight:  Fair  Psychomotor Activity:  Decreased  Concentration:  Concentration: Fair  Recall:  [employer] of Knowledge:  Fair  Language:  Fair  Akathisia:  No  Handed:  Right  AIMS (if indicated):     Assets:  Desire for Improvement Housing Physical Health  ADL's:  Intact  Cognition:  WNL  Sleep:  Number of Hours: 7    Treatment Plan Summary: Daily contact with patient to assess and evaluate symptoms and progress in treatment, Medication management and Plan Patient with major depression and with alcohol abuse in partial remission.  Recent suicidal thoughts.  Currently cooperative with treatment with reasonable insight.  No sign of any psychosis.  Spoke with the patient about his depression diagnosis.  Discussed  treatment both in and out of the hospital.  For now continue 15-minute checks to engage him in group therapy with daily individual assessments as well.  I propose starting mirtazapine since he is complaining particularly about his poor sleep.  Start 15 mg at night and then gradually titrate up.  Restart blood sugar checks regularly.  It sounds like he has been off of his insulin for a while and his blood sugars are running too high.  Blood pressure seems to be under better control but we will keep an eye on it.  Observation Level/Precautions:  15 minute checks  Laboratory:  Chemistry Profile  Psychotherapy:    Medications:    Consultations:    Discharge Concerns:    Estimated LOS:  Other:     Physician Treatment Plan for Primary Diagnosis: Severe episode of recurrent major depressive disorder, without psychotic features ([place]) Long Term Goal(s): Improvement in symptoms so as ready for discharge  Short Term Goals: Ability to disclose and discuss suicidal ideas and Ability  to demonstrate self-control will improve  Physician Treatment Plan for Secondary Diagnosis: Principal Problem:   Severe episode of recurrent major depressive disorder, without psychotic features ([place]) Active Problems:   HTN (hypertension)   Diabetes mellitus, type II ([place])   Suicidal ideation   Major depression  Long Term Goal(s): Improvement in symptoms so as ready for discharge  Short Term Goals: Ability to maintain clinical measurements within normal limits will improve and Compliance with prescribed medications will improve  I certify that inpatient services furnished can reasonably be expected to improve the patient's condition.    Alethia Berthold, MD 11/13/20201:12 PM

## 2019-02-05 NOTE — ED Notes (Signed)
Pt sleeping soundly - verbalizes a dislike that I have to swab him again for covid screening due to results are not back from 02/03/2019 testing   Swab collected

## 2019-02-05 NOTE — Progress Notes (Signed)
Patient ID: Christian [name], male   DOB: 1970/06/18, 48 y.o.   MRN: [id number] Patient is a 48 year old male who came to the ED under voluntarily commitment for severe depression and anxiety and suicidal thoughts by over dosing on pills , patient has stated that he slept is his car for three days thinking about harming himself and had considered shooting himself with a gun, patient that similar had occurred in the 90's when the wife had an extramarital affair . Patient is stable and alert x 4, responding appropriate ,mood is good and affect is normal though patient appeared subdued by stress and anxiety. Currently patient denies any suicidal or homicidal ideations , patient states that I know I will get help here , patient contract for safety of self and others also contract for plan of care. Unit guide line and expected behaviors are explained , unit and room orientation is complete , body search and skin check is done and no contraband found and skin is clean. Hygiene product is provided. Patient is placed in room 311 close to the nurses station for monitoring and Dr, Weber Cooks will be the attending, no distress.

## 2019-02-05 NOTE — [facility] Group Notes (Signed)
Feelings Around Relapse 02/05/2019 1PM  Type of Therapy and Topic:  Group Therapy:  Feelings around Relapse and Recovery  Participation Level:  Active   Description of Group:    Patients in this group will discuss emotions they experience before and after a relapse. They will process how experiencing these feelings, or avoidance of experiencing them, relates to having a relapse. Facilitator will guide patients to explore emotions they have related to recovery. Patients will be encouraged to process which emotions are more powerful. They will be guided to discuss the emotional reaction significant others in their lives may have to patients' relapse or recovery. Patients will be assisted in exploring ways to respond to the emotions of others without this contributing to a relapse.  Therapeutic Goals: 1. Patient will identify two or more emotions that lead to a relapse for them 2. Patient will identify two emotions that result when they relapse 3. Patient will identify two emotions related to recovery 4. Patient will demonstrate ability to communicate their needs through discussion and/or role plays   Summary of Patient Progress:  Actively and appropriately engaged in session. Pt discussed with group his past addiction to smoking cigarettes and having not smoked for 10 years. Pt completed relapse prevention plan and identified playing with a pet as a healthy coping method to prevent relapse.   Therapeutic Modalities:   Cognitive Behavioral Therapy Solution-Focused Therapy Assertiveness Training Relapse Prevention Therapy   Yvette Rack, LCSW 02/05/2019 2:22 PM

## 2019-02-05 NOTE — Progress Notes (Signed)
Recreation Therapy Notes  INPATIENT RECREATION THERAPY ASSESSMENT  Patient Details Name: BRAIAN MERRIGAN MRN: [id number] DOB: 1970-10-03 Today's Date: 02/05/2019       Information Obtained From: Patient  Able to Participate in Assessment/Interview: Yes  Patient Presentation: Responsive  Reason for Admission (Per Patient): Active Symptoms, Med Non-Compliance, Suicidal Ideation  Patient Stressors:    Coping Skills:   Other (Comment)(Pet dog, Walk)  Leisure Interests (2+):  (shooting range, cutting hair)  Frequency of Recreation/Participation: Monthly  Awareness of Community Resources:     [employer]:     Current Use:    If no, Barriers?:    Expressed Interest in [place] of Residence:  Insurance underwriter  Patient Main Form of Transportation: Musician  Patient Strengths:  N/A  Patient Identified Areas of Improvement:  Get a good paying job that is not killing my body  Patient Goal for Hospitalization:  Try to get some mental health help  Current SI (including self-harm):  No  Current HI:  No  Current AVH: No  Staff Intervention Plan: Group Attendance, Collaborate with Interdisciplinary Treatment Team  Consent to Intern Participation: N/A  Dilcia Rybarczyk 02/05/2019, 12:50 PM

## 2019-02-05 NOTE — ED Notes (Signed)
Specimen delivered to lab - pt appears to be asleep again

## 2019-02-05 NOTE — BHH Counselor (Signed)
Adult Comprehensive Assessment  Patient ID: Christian [name], male   DOB: September 17, 1970, 48 y.o.   MRN: [id number]  Information Source: Information source: Patient  Current Stressors:  Patient states their primary concerns and needs for treatment are:: Pt reports "me and my mother arguments are getting worse and happening more often.  I am sick of it.  She has dementia." Patient states their goals for this hospitilization and ongoing recovery are:: Pt reports "I don't know.  I'm just trying to go day by day." Educational / Learning stressors: Pt denies. Employment / Job issues: Pt reports that he is unemployed. Family Relationships: Pt reports a stressful relationship with his mother. Financial / Lack of resources (include bankruptcy): Pt reports no income. Housing / Lack of housing: Pt reports that he lives with his mother, but he plans to leave. Physical health (include injuries & life threatening diseases): Pt reports scoliosis, diabetes and neuropathy in feet. Social relationships: Pt reports "I don't get to socialize." Substance abuse: Pt reports alcohol use. Bereavement / Loss: Pt denies.  Living/Environment/Situation:  Living Arrangements: Parent Living conditions (as described by patient or guardian): Pt reports frequent arguments between him and his mother. Who else lives in the home?: Mother How long has patient lived in current situation?: 6 years What is atmosphere in current home: Chaotic  Family History:  Marital status: Single Are you sexually active?: No What is your sexual orientation?: heterosexual Has your sexual activity been affected by drugs, alcohol, medication, or emotional stress?: no Does patient have children?: Yes How many children?: 2 How is patient's relationship with their children?: Pt reports that his relationship with his daughter is improving.  Childhood History:  By whom was/is the patient raised?: Both parents Additional childhood history  information: Father passed 15-16 years ago. Description of patient's relationship with caregiver when they were a child: Pt reports "good". Patient's description of current relationship with people who raised him/her: Pt reports conflict with his mother. How were you disciplined when you got in trouble as a child/adolescent?: Pt reports that there wasn't any. Does patient have siblings?: Yes Number of Siblings: 2 Description of patient's current relationship with siblings: Pt reports "good". Did patient suffer any verbal/emotional/physical/sexual abuse as a child?: No Did patient suffer from severe childhood neglect?: No Has patient ever been sexually abused/assaulted/raped as an adolescent or adult?: No Was the patient ever a victim of a crime or a disaster?: No Witnessed domestic violence?: No Has patient been effected by domestic violence as an adult?: No  Education:  Highest grade of school patient has completed: 12th Currently a student?: No Learning disability?: No  Employment/Work Situation:   Employment situation: Unemployed What is the longest time patient has a held a job?: 6 years 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  Financial Resources:   Financial resources: No income Does patient have a Programmer, applications or guardian?: No  Alcohol/Substance Abuse:   What has been your use of drugs/alcohol within the last 12 months?: Alcohol: 1-2x month, "fair amount, maybe a 5th in a night, If I'm gonna drink I'm gonna do it right" If attempted suicide, did drugs/alcohol play a role in this?: No  Alcohol/Substance Abuse Treatment Hx:  Past Tx, Outpatient Has alcohol/substance abuse ever caused legal problems?: No  Social Support System:   Patient's Community Support System: Poor Describe Community Support System: Sister Type of faith/religion: Christian  Leisure/Recreation:   Leisure and Hobbies: "firearms"  Strengths/Needs:   What is the patient's perception of their strengths?: Pt reports that he is not sure what his strengths are.  Discharge Plan:   Currently receiving community mental health services: No Patient states concerns and preferences for aftercare planning are: Pt reports that he is open to outpatient treatment to address his mental health, he does not feel that he needs treatment to address his alcohol use. Patient states they will know when they are safe and ready for discharge when: Pt reports "I am not going to know.  It's all up to what happend when I get to the house." Does patient have access to transportation?: No Does patient have financial barriers related to discharge medications?: Yes Patient description of barriers related to discharge medications: Pt does not have insurance. Plan for no access to transportation at discharge: Pt will need transportaiton help.  Summary/Recommendations:   Summary and Recommendations (to be completed by the evaluator): Patient is a 48 year old male in a from [place], [place] ([place]   He presents to the hospital following suicidal ideations and increased depression.  He has a primary diagnosis of Major Depression.  Recommendations include: crisis stabilization, therapeutic milieu, encourage group attendance and participation, medication management for detox/mood stabilization and development of comprehensive mental wellness/sobriety plan.  Rozann Lesches. 02/05/2019

## 2019-02-06 DIAGNOSIS — F332 Major depressive disorder, recurrent severe without psychotic features: Principal | ICD-10-CM

## 2019-02-06 LAB — GLUCOSE, CAPILLARY
Glucose-Capillary: 232 mg/dL — ABNORMAL HIGH (ref 70–99)
Glucose-Capillary: 264 mg/dL — ABNORMAL HIGH (ref 70–99)
Glucose-Capillary: 212 mg/dL — ABNORMAL HIGH (ref 70–99)
Glucose-Capillary: 146 mg/dL — ABNORMAL HIGH (ref 70–99)

## 2019-02-06 MED ORDER — GLIPIZIDE 10 MG PO TABS
10.0000 mg | ORAL_TABLET | Freq: Two times a day (BID) | ORAL | Status: DC
  Administered 2019-02-06 – 2019-02-10 (×8): 10 mg via ORAL
  Filled 2019-02-06 (×10): qty 1

## 2019-02-06 MED ORDER — CARVEDILOL 12.5 MG PO TABS
25.0000 mg | ORAL_TABLET | Freq: Two times a day (BID) | ORAL | Status: DC
  Administered 2019-02-06 – 2019-02-10 (×8): 25 mg via ORAL
  Filled 2019-02-06 (×8): qty 2

## 2019-02-06 MED ORDER — LOSARTAN POTASSIUM 50 MG PO TABS
50.0000 mg | ORAL_TABLET | Freq: Every day | ORAL | Status: DC
  Administered 2019-02-06 – 2019-02-07 (×2): 50 mg via ORAL
  Filled 2019-02-06 (×2): qty 1

## 2019-02-06 MED ORDER — ATORVASTATIN CALCIUM 20 MG PO TABS
10.0000 mg | ORAL_TABLET | Freq: Every day | ORAL | Status: DC
  Administered 2019-02-06 – 2019-02-09 (×4): 10 mg via ORAL
  Filled 2019-02-06 (×4): qty 1

## 2019-02-06 NOTE — Plan of Care (Signed)
Patient is responding well to treatment regimen and denies any pain and has no physical complains , patient denies any SI/HI/AVH, socializing appropriately with peers with no issues and has not requested any PRNs , sleep is restful and monitored every 15 minutes for safety no distress.   Problem: Education: Goal: Knowledge of [facility] Education information/materials will improve Outcome: Progressing Goal: Emotional status will improve Outcome: Progressing Goal: Mental status will improve Outcome: Progressing Goal: Verbalization of understanding the information provided will improve Outcome: Progressing   Problem: Safety: Goal: Periods of time without injury will increase Outcome: Progressing   Problem: Education: Goal: Ability to make informed decisions regarding treatment will improve Outcome: Progressing   Problem: Coping: Goal: Coping ability will improve Outcome: Progressing

## 2019-02-06 NOTE — Tx Team (Signed)
Interdisciplinary Treatment and Diagnostic Plan Update  02/06/2019 Time of Session: 9am Christian [name] MRN: [id number]  Principal Diagnosis: Severe episode of recurrent major depressive disorder, without psychotic features (Christian [name])  Secondary Diagnoses: Principal Problem:   Severe episode of recurrent major depressive disorder, without psychotic features (Christian [name]) Active Problems:   HTN (hypertension)   Diabetes mellitus, type II (Christian [name])   Suicidal ideation   Major depression   Current Medications:  Current Facility-Administered Medications  Christian Dose Route Frequency Provider Last Rate Last Dose  . acetaminophen (TYLENOL) tablet 650 mg  650 mg Oral Q6H PRN Cristofano, Dorene Ar, MD      . alum & mag hydroxide-simeth (MAALOX/MYLANTA) 200-200-20 MG/5ML suspension 30 mL  30 mL Oral Q4H PRN Cristofano, Paul A, MD      . aspirin chewable tablet 81 mg  81 mg Oral Daily Cristofano, Dorene Ar, MD   81 mg at 02/06/19 0832  . insulin aspart (novoLOG) injection 0-15 Units  0-15 Units Subcutaneous TID WC Clapacs, Madie Reno, MD   5 Units at 02/06/19 [phone]  . magnesium hydroxide (MILK OF MAGNESIA) suspension 30 mL  30 mL Oral Daily PRN Cristofano, Paul A, MD      . metFORMIN (GLUCOPHAGE) tablet 1,000 mg  1,000 mg Oral BID WC Clapacs, Madie Reno, MD   1,000 mg at 02/06/19 0832  . mirtazapine (REMERON) tablet 15 mg  15 mg Oral QHS Clapacs, Madie Reno, MD   15 mg at 02/05/19 2132   PTA Medications: No medications prior to admission.    Patient Stressors: Financial difficulties Marital or family conflict Christian [name] Occupational concerns  Patient Strengths: Capable of independent living Agricultural engineer for treatment/growth Supportive family/friends  Treatment Modalities: Christian Management, Group therapy, Case management,  1 to 1 session with clinician, Psychoeducation, Recreational therapy.   Physician Treatment Plan for Primary Diagnosis: Severe episode of recurrent  major depressive disorder, without psychotic features (Christian [name]) Long Term Goal(s): Improvement in symptoms so as ready for discharge Improvement in symptoms so as ready for discharge   Short Term Goals: Ability to disclose and discuss suicidal ideas Ability to demonstrate self-control will improve Ability to maintain clinical measurements within normal limits will improve Compliance with prescribed medications will improve  Christian Management: Evaluate patient's response, side effects, and tolerance of Christian regimen.  Therapeutic Interventions: 1 to 1 sessions, Unit Group sessions and Christian administration.  Evaluation of Outcomes: Not Met  Physician Treatment Plan for Secondary Diagnosis: Principal Problem:   Severe episode of recurrent major depressive disorder, without psychotic features (Christian [name]) Active Problems:   HTN (hypertension)   Diabetes mellitus, type II (Christian [name])   Suicidal ideation   Major depression  Long Term Goal(s): Improvement in symptoms so as ready for discharge Improvement in symptoms so as ready for discharge   Short Term Goals: Ability to disclose and discuss suicidal ideas Ability to demonstrate self-control will improve Ability to maintain clinical measurements within normal limits will improve Compliance with prescribed medications will improve     Christian Management: Evaluate patient's response, side effects, and tolerance of Christian regimen.  Therapeutic Interventions: 1 to 1 sessions, Unit Group sessions and Christian administration.  Evaluation of Outcomes: Not Met   RN Treatment Plan for Primary Diagnosis: Severe episode of recurrent major depressive disorder, without psychotic features (Christian [name]) Long Term Goal(s): Knowledge of disease and therapeutic regimen to maintain health will improve  Short Term Goals: Ability to participate in decision making will improve, Ability to verbalize feelings will  improve, Ability to disclose and discuss  suicidal ideas, Ability to identify and develop effective coping behaviors will improve and Compliance with prescribed medications will improve  Christian Management: RN will administer medications as ordered by provider, will assess and evaluate patient's response and provide education to patient for prescribed Christian. RN will report any adverse and/or side effects to prescribing provider.  Therapeutic Interventions: 1 on 1 counseling sessions, Psychoeducation, Christian administration, Evaluate responses to treatment, Monitor vital signs and CBGs as ordered, Perform/monitor CIWA, COWS, AIMS and Fall Risk screenings as ordered, Perform wound care treatments as ordered.  Evaluation of Outcomes: Not Met   LCSW Treatment Plan for Primary Diagnosis: Severe episode of recurrent major depressive disorder, without psychotic features (Christian [name]) Long Term Goal(s): Safe transition to appropriate next level of care at discharge, Engage patient in therapeutic group addressing interpersonal concerns.  Short Term Goals: Engage patient in aftercare planning with referrals and resources  Therapeutic Interventions: Assess for all discharge needs, 1 to 1 time with Social worker, Explore available resources and support systems, Assess for adequacy in community support network, Educate family and significant other(s) on suicide prevention, Complete Psychosocial Assessment, Interpersonal group therapy.  Evaluation of Outcomes: Not Met   Progress in Treatment: Attending groups: Yes. Participating in groups: Yes. Taking Christian as prescribed: Yes. Toleration Christian: Yes. Family/Significant other contact made: Yes, individual(s) contacted:  pt sister Patient understands diagnosis: Yes. Discussing patient identified problems/goals with staff: Yes. Medical problems stabilized or resolved: No. Denies suicidal/homicidal ideation: No. Issues/concerns per patient self-inventory: No. Other: NA  New  problem(s) identified: No, Describe:  none reported  New Short Term/Long Term Goal(s):Attend outpatient treatment, take Christian as prescribed, develop and implement healthy coping methods  Patient Goals:  "Figure out how to deal with my mother"  Discharge Plan or Barriers: Pt will return home and follow up with outpatient treatment  Reason for Continuation of Hospitalization: Depression Christian stabilization Suicidal ideation  Estimated Length of Stay:1-7 days  Attendees: Patient:Christian [name] 02/06/2019 10:51 AM  Physician: Myles Lipps 02/06/2019 10:51 AM  Nursing:  02/06/2019 10:51 AM  RN Care Manager: 02/06/2019 10:51 AM  Social Worker: Sanjuana Kava 02/06/2019 10:51 AM  Recreational Therapist:  02/06/2019 10:51 AM  Other:  02/06/2019 10:51 AM  Other:  02/06/2019 10:51 AM  Other: 02/06/2019 10:51 AM    Scribe for Treatment Team: Yvette Rack, LCSW 02/06/2019 10:51 AM

## 2019-02-06 NOTE — [facility] Group Notes (Signed)
[facility] Group Notes:  (Nursing/MHT/Case Management/Adjunct)  Date:  02/06/2019  Time:  9:09 PM  Type of Therapy:  Group Therapy  Participation Level:  Active  Participation Quality:  Appropriate  Affect:  Appropriate  Cognitive:  Alert  Insight:  Good  Engagement in Group:  Engaged  Modes of Intervention:  Support  Summary of Progress/Problems:  Christian [name] 02/06/2019, 9:09 PM

## 2019-02-06 NOTE — Progress Notes (Signed)
D: Patient has been calm and cooperative. Denies SI, HI and AVH A: Continue to monitor for safety R: Safety maintaine

## 2019-02-06 NOTE — Plan of Care (Signed)
  Problem: Safety: Goal: Periods of time without injury will increase Outcome: Progressing  D: Patient has been calm and cooperative. Denies SI, HI and AVH A: Continue to monitor for safety R: Safety maintained.

## 2019-02-06 NOTE — Plan of Care (Signed)
D: Patient denies SI/HI/AVH. Patient verbally contracts for safety. Patient is calm, cooperative and pleasant. Patient is seen in milieu interacting with peers. Patient has no complaints at this time.  A: Patient was assessed by this nurse. Patient was oriented to unit. Patient's safety was maintained on unit. Q x 15 minute observation checks were completed for safety. Patient care plan was reviewed. Patient was offered support and encouragement. Patient was encourage to attend groups, participate in unit activities and continue with plan of care.    R: Patient has no complaints of pain at this time. Patient is receptive to treatment and safety maintained on unit.     Problem: Education: Goal: Knowledge of [facility] Education information/materials will improve Outcome: Progressing Goal: Emotional status will improve Outcome: Progressing Goal: Mental status will improve Outcome: Progressing

## 2019-02-06 NOTE — Progress Notes (Signed)
[facility] MD Progress Note  02/06/2019 1:23 PM CALVEN SANKER  MRN:  [id number] Subjective: Patient is a 48 year old male who was admitted on 02/05/2019 with a history of depression and having worsening mood and suicidal ideation.  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  Objective: Patient is seen and examined.  Patient is a 48 year old male with the above-stated past psychiatric history seen in follow-up.  He was started on mirtazapine 15 mg p.o. nightly.  He stated he felt very good today.  He stated he slept very well last night.  We discussed the fact that he felt like a 0 out of 10 on admission, and that today he felt like an 8 or 9 out of 10 (near normal).  He denied any side effects to his current medications.  He denied any auditory or visual hallucinations.  He denied any suicidal or homicidal ideation.  He continues on Metformin, sliding scale insulin and mirtazapine.  His blood pressure remains elevated today at 161/107.  He is afebrile.  He slept 7.5 hours last night.  Review of the electronic medical record revealed his previous blood pressure medicines on a hospitalization from 05/08/2018 included carvedilol 25 mg p.o. twice daily, losartan 100 mg p.o. daily.  Review of his laboratories revealed a significantly elevated glucose on admission of 365.  His total cholesterol and triglycerides were elevated.  CBC was essentially normal.  TSH and drug screen were normal.  His hemoglobin A1c was elevated at 9.4.  His blood sugar this morning was 264.  Principal Problem: Severe episode of recurrent major depressive disorder, without psychotic features ([place]) Diagnosis: Principal Problem:   Severe episode of recurrent major depressive disorder, without psychotic features ([place]) Active Problems:   HTN (hypertension)   Diabetes mellitus, type II ([place])   Suicidal ideation   Major depression  Total Time spent with  patient: 20 minutes  Past Psychiatric History: See admission H&P  Past Medical History:  Past Medical History:  Diagnosis Date  . CAD (coronary artery disease)    a. 07/2013 NSTEMI/PCI: LCX 7m (4.0x23 Xience DES), RCA 98m, EF > 55%;  b. 07/2013 Echo: EF 60-65%, diast dysfxn, mild LVH, mildly dil LA, nl RV size/fxn, nl RVSP.  Marland Kitchen History of tobacco abuse    a. quit ~ 2010  . Hyperlipidemia   . Morbid obesity ([place])    a. weighed 168 @ age 34.  . Scoliosis     Past Surgical History:  Procedure Laterality Date  . ADENOIDECTOMY    . CARDIAC CATHETERIZATION  07/2013   [facility]'x1 stent   Family History:  Family History  Problem Relation Age of Onset  . Heart disease Mother   . Esophageal cancer Father    Family Psychiatric  History: See admission H&P Social History:  Social History   Substance and Sexual Activity  Alcohol Use Yes     Social History   Substance and Sexual Activity  Drug Use No  . Types: Marijuana   Comment: past    Social History   Socioeconomic History  . Marital status: Single    Spouse name: Not on file  . Number of children: Not on file  . Years of education: Not on file  . Highest education level: Not on file  Occupational History  . Not on file  Social Needs  . Financial resource strain: Not on file  . Food insecurity  Worry: Not on file    Inability: Not on file  . Transportation needs    Medical: Not on file    Non-medical: Not on file  Tobacco Use  . Smoking status: Former Smoker    Packs/day: 0.50    Years: 20.00    Pack years: 10.00    Types: Cigarettes  . Smokeless tobacco: Never Used  Substance and Sexual Activity  . Alcohol use: Yes  . Drug use: No    Types: Marijuana    Comment: past  . Sexual activity: Not on file  Lifestyle  . Physical activity    Days per week: Not on file    Minutes per session: Not on file  . Stress: Not on file  Relationships  . Social Herbalist on phone: Not on file    Gets together:  Not on file    Attends religious service: Not on file    Active member of club or organization: Not on file    Attends meetings of clubs or organizations: Not on file    Relationship status: Not on file  Other Topics Concern  . Not on file  Social History Narrative  . Not on file   Additional Social History:                         Sleep: Good  Appetite:  Fair  Current Medications: Current Facility-Administered Medications  Medication Dose Route Frequency Provider Last Rate Last Dose  . acetaminophen (TYLENOL) tablet 650 mg  650 mg Oral Q6H PRN Cristofano, Dorene Ar, MD      . alum & mag hydroxide-simeth (MAALOX/MYLANTA) 200-200-20 MG/5ML suspension 30 mL  30 mL Oral Q4H PRN Cristofano, Paul A, MD      . aspirin chewable tablet 81 mg  81 mg Oral Daily Cristofano, Dorene Ar, MD   81 mg at 02/06/19 0832  . insulin aspart (novoLOG) injection 0-15 Units  0-15 Units Subcutaneous TID WC Clapacs, Madie Reno, MD   8 Units at 02/06/19 1133  . magnesium hydroxide (MILK OF MAGNESIA) suspension 30 mL  30 mL Oral Daily PRN Cristofano, Paul A, MD      . metFORMIN (GLUCOPHAGE) tablet 1,000 mg  1,000 mg Oral BID WC Clapacs, Madie Reno, MD   1,000 mg at 02/06/19 0832  . mirtazapine (REMERON) tablet 15 mg  15 mg Oral QHS Clapacs, Madie Reno, MD   15 mg at 02/05/19 2132    Lab Results:  Results for orders placed or performed during the hospital encounter of 02/05/19 (from the past 48 hour(s))  Glucose, capillary     Status: Abnormal   Collection Time: 02/05/19  7:06 AM  Result Value Ref Range   Glucose-Capillary 190 (H) 70 - 99 mg/dL   Comment 1 Notify RN   Hepatic function panel     Status: None   Collection Time: 02/05/19  7:20 AM  Result Value Ref Range   Total Protein 7.0 6.5 - 8.1 g/dL   Albumin 4.1 3.5 - 5.0 g/dL   AST 18 15 - 41 U/L   ALT 16 0 - 44 U/L   Alkaline Phosphatase 60 38 - 126 U/L   Total Bilirubin 1.1 0.3 - 1.2 mg/dL   Bilirubin, Direct 0.2 0.0 - 0.2 mg/dL   Indirect Bilirubin  0.9 0.3 - 0.9 mg/dL    Comment: Performed at [facility], [address] [place], [place] [address]  Lipid panel  Status: Abnormal   Collection Time: 02/05/19  7:20 AM  Result Value Ref Range   Cholesterol 243 (H) 0 - 200 mg/dL   Triglycerides 234 (H) <150 mg/dL   HDL 55 >40 mg/dL   Total CHOL/HDL Ratio 4.4 RATIO   VLDL 47 (H) 0 - 40 mg/dL   LDL Cholesterol 141 (H) 0 - 99 mg/dL    Comment:        Total Cholesterol/HDL:CHD Risk Coronary Heart Disease Risk Table                     Men   Women  1/2 Average Risk   3.4   3.3  Average Risk       5.0   4.4  2 X Average Risk   9.6   7.1  3 X Average Risk  23.4   11.0        Use the calculated Patient Ratio above and the CHD Risk Table to determine the patient's CHD Risk.        ATP III CLASSIFICATION (LDL):  <100     mg/dL   Optimal  100-129  mg/dL   Near or Above                    Optimal  130-159  mg/dL   Borderline  160-189  mg/dL   High  >190     mg/dL   Very High Performed at [facility], [address] [place], [place] [address]   TSH     Status: None   Collection Time: 02/05/19  7:20 AM  Result Value Ref Range   TSH 2.838 0.350 - 4.500 uIU/mL    Comment: Performed by a 3rd Generation assay with a functional sensitivity of <=0.01 uIU/mL. Performed at [facility], [address] [place], [place] [address]   Hemoglobin A1c     Status: Abnormal   Collection Time: 02/05/19  7:20 AM  Result Value Ref Range   Hgb A1c MFr Bld 9.4 (H) 4.8 - 5.6 %    Comment: (NOTE) Pre diabetes:          5.7%-6.4% Diabetes:              >6.4% Glycemic control for   <7.0% adults with diabetes    Mean Plasma Glucose 223.08 mg/dL    Comment: Performed at [facility] [address], [place], [place] [address]  Glucose, capillary     Status: Abnormal   Collection Time: 02/05/19  4:19 PM  Result Value Ref Range   Glucose-Capillary 214 (H) 70 - 99 mg/dL   Comment 1 Notify RN   Glucose, capillary      Status: Abnormal   Collection Time: 02/05/19  9:03 PM  Result Value Ref Range   Glucose-Capillary 213 (H) 70 - 99 mg/dL   Comment 1 Notify RN   Glucose, capillary     Status: Abnormal   Collection Time: 02/06/19  7:15 AM  Result Value Ref Range   Glucose-Capillary 232 (H) 70 - 99 mg/dL   Comment 1 Notify RN   Glucose, capillary     Status: Abnormal   Collection Time: 02/06/19 11:14 AM  Result Value Ref Range   Glucose-Capillary 264 (H) 70 - 99 mg/dL   Comment 1 Notify RN     Blood Alcohol level:  Lab Results  Component Value Date   ETH <10 02/03/2019   ETH <10 [id number]    Metabolic  Disorder Labs: Lab Results  Component Value Date   HGBA1C 9.4 (H) 02/05/2019   MPG 223.08 02/05/2019   MPG 217.34 05/30/2018   No results found for: PROLACTIN Lab Results  Component Value Date   CHOL 243 (H) 02/05/2019   TRIG 234 (H) 02/05/2019   HDL 55 02/05/2019   CHOLHDL 4.4 02/05/2019   VLDL 47 (H) 02/05/2019   LDLCALC 141 (H) 02/05/2019   LDLCALC UNABLE TO CALCULATE IF TRIGLYCERIDE OVER 400 mg/dL 05/30/2018    Physical Findings: AIMS:  , ,  ,  ,    CIWA:    COWS:     Musculoskeletal: Strength & Muscle Tone: within normal limits Gait & Station: normal Patient leans: N/A  Psychiatric Specialty Exam: Physical Exam  Nursing note and vitals reviewed. Constitutional: He is oriented to person, place, and time. He appears well-developed and well-nourished.  HENT:  Head: Normocephalic and atraumatic.  Respiratory: Effort normal.  Neurological: He is alert and oriented to person, place, and time.    ROS  Blood pressure (!) 161/107, pulse 79, temperature 98.3 F (36.8 C), temperature source Oral, resp. rate 16, height 5\' 6"  (1.676 m), weight 74.8 kg, SpO2 99 %.Body mass index is 26.62 kg/m.  General Appearance: Casual  Eye Contact:  Good  Speech:  Normal Rate  Volume:  Normal  Mood:  Euthymic  Affect:  Congruent  Thought Process:  Coherent and Descriptions of  Associations: Intact  Orientation:  Full (Time, Place, and Person)  Thought Content:  Logical  Suicidal Thoughts:  No  Homicidal Thoughts:  No  Memory:  Immediate;   Fair Recent;   Fair Remote;   Fair  Judgement:  Intact  Insight:  Fair  Psychomotor Activity:  Normal  Concentration:  Concentration: Fair and Attention Span: Fair  Recall:  [employer] of Knowledge:  Fair  Language:  Fair  Akathisia:  Negative  Handed:  Right  AIMS (if indicated):     Assets:  Desire for Improvement Resilience  ADL's:  Intact  Cognition:  WNL  Sleep:  Number of Hours: 7.5     Treatment Plan Summary: Daily contact with patient to assess and evaluate symptoms and progress in treatment, Medication management and Plan : Patient is seen and examined.  Patient is a 48 year old male with the above-stated past psychiatric history who is seen in follow-up.   Diagnosis: #1 major depression, recurrent, severe without psychotic features, #2 hypertension, #3 diabetes mellitus type 2  Patient is seen in follow-up.  He is doing better with regard to his mental state and his depression.  No change in his mirtazapine.  His blood pressure is elevated, and I am going to readd the losartan and carvedilol.  Additionally his blood sugar is very elevated, and I am going to readd his glipizide at 10 mg p.o. twice daily.  Hopefully this will improve those situations.  I will also restart his Lipitor. 1.  Continue coated aspirin 81 mg p.o. daily for heart health. 2.  Continue sliding scale NovoLog insulin. 3.  Continue Metformin at 1000 mg p.o. twice daily for diabetes mellitus type 2. 4.  Continue mirtazapine 15 mg p.o. nightly for mood, anxiety and sleep. 5.  Restart carvedilol 25 mg p.o. twice daily for hypertension. 6.  Restart glipizide 10 mg p.o. twice daily for diabetes mellitus type 2 7.  Restart losartan at 50 mg p.o. daily for hypertension. 8.  Restart Lipitor 10 mg p.o. daily for hyperlipidemia. 9.  Continue to  follow  blood sugars 4 times daily. 10.  Disposition planning-in progress.  Sharma Covert, MD 02/06/2019, 1:23 PM

## 2019-02-07 LAB — GLUCOSE, CAPILLARY
Glucose-Capillary: 223 mg/dL — ABNORMAL HIGH (ref 70–99)
Glucose-Capillary: 256 mg/dL — ABNORMAL HIGH (ref 70–99)
Glucose-Capillary: 163 mg/dL — ABNORMAL HIGH (ref 70–99)
Glucose-Capillary: 212 mg/dL — ABNORMAL HIGH (ref 70–99)

## 2019-02-07 MED ORDER — INSULIN GLARGINE 100 UNIT/ML ~~LOC~~ SOLN
10.0000 [IU] | Freq: Every day | SUBCUTANEOUS | Status: DC
  Administered 2019-02-07 – 2019-02-09 (×3): 10 [IU] via SUBCUTANEOUS
  Filled 2019-02-07 (×4): qty 0.1

## 2019-02-07 MED ORDER — LOSARTAN POTASSIUM 50 MG PO TABS
100.0000 mg | ORAL_TABLET | Freq: Every day | ORAL | Status: DC
  Administered 2019-02-08 – 2019-02-10 (×3): 100 mg via ORAL
  Filled 2019-02-07 (×3): qty 2

## 2019-02-07 MED ORDER — TRAZODONE HCL 50 MG PO TABS: 50.0000 mg | ORAL_TABLET | Freq: Every evening | ORAL | Status: DC | PRN

## 2019-02-07 MED ORDER — LOSARTAN POTASSIUM 50 MG PO TABS
50.0000 mg | ORAL_TABLET | ORAL | Status: AC
  Administered 2019-02-07: 50 mg via ORAL
  Filled 2019-02-07: qty 1

## 2019-02-07 MED ORDER — GABAPENTIN 100 MG PO CAPS
200.0000 mg | ORAL_CAPSULE | Freq: Three times a day (TID) | ORAL | Status: DC
  Administered 2019-02-07 – 2019-02-10 (×8): 200 mg via ORAL
  Filled 2019-02-07 (×8): qty 2

## 2019-02-07 NOTE — Plan of Care (Signed)
D- Patient alert and oriented. Patient presented in a pleasant mood on assessment stating that he didn't sleep as good last night as he did the night before, however, he did not have any complaints to voice to this Probation officer. Patient denied SI, HI, AVH, and pain at this time. Patient also denied any signs/symptoms of depression/anxiety, stating "no, I'm pretty good right now". Patient's goal for today is "getting out of here".  A- Scheduled medications administered to patient, per MD orders. Support and encouragement provided.  Routine safety checks conducted every 15 minutes.  Patient informed to notify staff with problems or concerns.  R- No adverse drug reactions noted. Patient contracts for safety at this time. Patient compliant with medications and treatment plan. Patient receptive, calm, and cooperative. Patient interacts well with others on the unit.  Patient remains safe at this time.  Problem: Education: Goal: Knowledge of [facility] Education information/materials will improve Outcome: Progressing Goal: Emotional status will improve Outcome: Progressing Goal: Mental status will improve Outcome: Progressing Goal: Verbalization of understanding the information provided will improve Outcome: Progressing   Problem: Safety: Goal: Periods of time without injury will increase Outcome: Progressing   Problem: Education: Goal: Ability to make informed decisions regarding treatment will improve Outcome: Progressing   Problem: Coping: Goal: Coping ability will improve Outcome: Progressing

## 2019-02-07 NOTE — B[facility] Group Notes (Signed)
LCSW Group Therapy Note 02/07/2019 1:15pm  Type of Therapy and Topic: Group Therapy: Feelings Around Returning Home & Establishing a Supportive Framework and Supporting Oneself When Supports Not Available  Participation Level: Active  Description of Group:  Patients first processed thoughts and feelings about upcoming discharge. These included fears of upcoming changes, lack of change, new living environments, judgements and expectations from others and overall stigma of mental health issues. The group then discussed the definition of a supportive framework, what that looks and feels like, and how do to discern it from an unhealthy non-supportive network. The group identified different types of supports as well as what to do when your family/friends are less than helpful or unavailable  Therapeutic Goals  1. Patient will identify one healthy supportive network that they can use at discharge. 2. Patient will identify one factor of a supportive framework and how to tell it from an unhealthy network. 3. Patient able to identify one coping skill to use when they do not have positive supports from others. 4. Patient will demonstrate ability to communicate their needs through discussion and/or role plays.  Summary of Patient Progress:  The patient reported he feels tired. Pt engaged during group session. As patients processed their anxiety about discharge and described healthy supports patient shared he is ready to be discharge. He stated, "I learned all I needed to learn here."  Patients identified at least one self-care tool they were willing to use after discharge.   Therapeutic Modalities Cognitive Behavioral Therapy Motivational Interviewing   Cheree Ditto, LCSW 02/07/2019 12:23 PM

## 2019-02-07 NOTE — Progress Notes (Signed)
[facility] MD Progress Note  02/07/2019 11:00 AM Christian [name]  MRN:  [id number] Subjective:  Patient is a 48 year old male who was admitted on 02/05/2019 with a history of depression and having worsening mood and suicidal ideation.  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  Objective: Patient is seen and examined.  Patient is a 48 year old male with the above-stated past psychiatric history who is seen in follow-up.  Patient stated he felt as though he did not sleep as well last night as he had the night before.  Nursing notes reflect that he slept 6.25 hours.  His blood pressure is mildly elevated today at 143/92.  He denied any suicidal or homicidal ideation.  He denied any auditory or visual hallucinations.  His blood sugar this morning remains elevated at 223.  No other new laboratories.  Principal Problem: Severe episode of recurrent major depressive disorder, without psychotic features ([place]) Diagnosis: Principal Problem:   Severe episode of recurrent major depressive disorder, without psychotic features ([place]) Active Problems:   HTN (hypertension)   Diabetes mellitus, type II ([place])   Suicidal ideation   Major depression  Total Time spent with patient: 15 minutes  Past Psychiatric History: See admission H&P  Past Medical History:  Past Medical History:  Diagnosis Date  . CAD (coronary artery disease)    a. 07/2013 NSTEMI/PCI: LCX 4m (4.0x23 Xience DES), RCA 97m, EF > 55%;  b. 07/2013 Echo: EF 60-65%, diast dysfxn, mild LVH, mildly dil LA, nl RV size/fxn, nl RVSP.  Marland Kitchen History of tobacco abuse    a. quit ~ 2010  . Hyperlipidemia   . Morbid obesity ([place])    a. weighed 168 @ age 58.  . Scoliosis     Past Surgical History:  Procedure Laterality Date  . ADENOIDECTOMY    . CARDIAC CATHETERIZATION  07/2013   [facility]'x1 stent   Family History:  Family History  Problem Relation Age of Onset  . Heart  disease Mother   . Esophageal cancer Father    Family Psychiatric  History: See admission H&P Social History:  Social History   Substance and Sexual Activity  Alcohol Use Yes     Social History   Substance and Sexual Activity  Drug Use No  . Types: Marijuana   Comment: past    Social History   Socioeconomic History  . Marital status: Single    Spouse name: Not on file  . Number of children: Not on file  . Years of education: Not on file  . Highest education level: Not on file  Occupational History  . Not on file  Social Needs  . Financial resource strain: Not on file  . Food insecurity    Worry: Not on file    Inability: Not on file  . Transportation needs    Medical: Not on file    Non-medical: Not on file  Tobacco Use  . Smoking status: Former Smoker    Packs/day: 0.50    Years: 20.00    Pack years: 10.00    Types: Cigarettes  . Smokeless tobacco: Never Used  Substance and Sexual Activity  . Alcohol use: Yes  . Drug use: No    Types: Marijuana    Comment: past  . Sexual activity: Not on file  Lifestyle  . Physical activity    Days per week: Not on file    Minutes per  session: Not on file  . Stress: Not on file  Relationships  . Social Herbalist on phone: Not on file    Gets together: Not on file    Attends religious service: Not on file    Active member of club or organization: Not on file    Attends meetings of clubs or organizations: Not on file    Relationship status: Not on file  Other Topics Concern  . Not on file  Social History Narrative  . Not on file   Additional Social History:                         Sleep: Fair  Appetite:  Fair  Current Medications: Current Facility-Administered Medications  Medication Dose Route Frequency Provider Last Rate Last Dose  . acetaminophen (TYLENOL) tablet 650 mg  650 mg Oral Q6H PRN Cristofano, Dorene Ar, MD      . alum & mag hydroxide-simeth (MAALOX/MYLANTA) 200-200-20 MG/5ML  suspension 30 mL  30 mL Oral Q4H PRN Cristofano, Paul A, MD      . aspirin chewable tablet 81 mg  81 mg Oral Daily Cristofano, Dorene Ar, MD   81 mg at 02/07/19 0759  . atorvastatin (LIPITOR) tablet 10 mg  10 mg Oral q1800 Sharma Covert, MD   10 mg at 02/06/19 1628  . carvedilol (COREG) tablet 25 mg  25 mg Oral BID WC Sharma Covert, MD   25 mg at 02/07/19 0800  . glipiZIDE (GLUCOTROL) tablet 10 mg  10 mg Oral BID AC Sharma Covert, MD   10 mg at 02/07/19 0759  . insulin aspart (novoLOG) injection 0-15 Units  0-15 Units Subcutaneous TID WC Clapacs, Madie Reno, MD   5 Units at 02/07/19 0800  . losartan (COZAAR) tablet 50 mg  50 mg Oral Daily Sharma Covert, MD   50 mg at 02/07/19 0759  . magnesium hydroxide (MILK OF MAGNESIA) suspension 30 mL  30 mL Oral Daily PRN Cristofano, Paul A, MD      . metFORMIN (GLUCOPHAGE) tablet 1,000 mg  1,000 mg Oral BID WC Clapacs, Madie Reno, MD   1,000 mg at 02/07/19 0759  . mirtazapine (REMERON) tablet 15 mg  15 mg Oral QHS Clapacs, Madie Reno, MD   15 mg at 02/06/19 2159    Lab Results:  Results for orders placed or performed during the hospital encounter of 02/05/19 (from the past 48 hour(s))  Glucose, capillary     Status: Abnormal   Collection Time: 02/05/19  4:19 PM  Result Value Ref Range   Glucose-Capillary 214 (H) 70 - 99 mg/dL   Comment 1 Notify RN   Glucose, capillary     Status: Abnormal   Collection Time: 02/05/19  9:03 PM  Result Value Ref Range   Glucose-Capillary 213 (H) 70 - 99 mg/dL   Comment 1 Notify RN   Glucose, capillary     Status: Abnormal   Collection Time: 02/06/19  7:15 AM  Result Value Ref Range   Glucose-Capillary 232 (H) 70 - 99 mg/dL   Comment 1 Notify RN   Glucose, capillary     Status: Abnormal   Collection Time: 02/06/19 11:14 AM  Result Value Ref Range   Glucose-Capillary 264 (H) 70 - 99 mg/dL   Comment 1 Notify RN   Glucose, capillary     Status: Abnormal   Collection Time: 02/06/19  4:26 PM  Result Value Ref  Range   Glucose-Capillary 212 (H) 70 - 99 mg/dL   Comment 1 Document in Chart   Glucose, capillary     Status: Abnormal   Collection Time: 02/06/19  8:53 PM  Result Value Ref Range   Glucose-Capillary 146 (H) 70 - 99 mg/dL   Comment 1 Notify RN   Glucose, capillary     Status: Abnormal   Collection Time: 02/07/19  7:00 AM  Result Value Ref Range   Glucose-Capillary 223 (H) 70 - 99 mg/dL   Comment 1 Notify RN     Blood Alcohol level:  Lab Results  Component Value Date   ETH <10 02/03/2019   ETH <10 [id number]    Metabolic Disorder Labs: Lab Results  Component Value Date   HGBA1C 9.4 (H) 02/05/2019   MPG 223.08 02/05/2019   MPG 217.34 05/30/2018   No results found for: PROLACTIN Lab Results  Component Value Date   CHOL 243 (H) 02/05/2019   TRIG 234 (H) 02/05/2019   HDL 55 02/05/2019   CHOLHDL 4.4 02/05/2019   VLDL 47 (H) 02/05/2019   LDLCALC 141 (H) 02/05/2019   LDLCALC UNABLE TO CALCULATE IF TRIGLYCERIDE OVER 400 mg/dL 05/30/2018    Physical Findings: AIMS:  , ,  ,  ,    CIWA:    COWS:     Musculoskeletal: Strength & Muscle Tone: within normal limits Gait & Station: normal Patient leans: N/A  Psychiatric Specialty Exam: Physical Exam  Nursing note and vitals reviewed. Constitutional: He is oriented to person, place, and time. He appears well-developed and well-nourished.  HENT:  Head: Normocephalic and atraumatic.  Respiratory: Effort normal.  Neurological: He is alert and oriented to person, place, and time.    ROS  Blood pressure (!) 143/92, pulse 73, temperature 98.2 F (36.8 C), temperature source Oral, resp. rate 18, height 5\' 6"  (1.676 m), weight 74.8 kg, SpO2 100 %.Body mass index is 26.62 kg/m.  General Appearance: Casual  Eye Contact:  Fair  Speech:  Normal Rate  Volume:  Normal  Mood:  Anxious  Affect:  Congruent  Thought Process:  Coherent and Descriptions of Associations: Intact  Orientation:  Full (Time, Place, and Person)  Thought  Content:  Logical  Suicidal Thoughts:  No  Homicidal Thoughts:  No  Memory:  Immediate;   Fair Recent;   Fair Remote;   Fair  Judgement:  Intact  Insight:  Fair  Psychomotor Activity:  Normal  Concentration:  Concentration: Fair and Attention Span: Fair  Recall:  [employer] of Knowledge:  Good  Language:  Good  Akathisia:  Negative  Handed:  Right  AIMS (if indicated):     Assets:  Desire for Improvement Resilience  ADL's:  Intact  Cognition:  WNL  Sleep:  Number of Hours: 6.25     Treatment Plan Summary: Daily contact with patient to assess and evaluate symptoms and progress in treatment, Medication management and Plan : Patient is seen and examined.  Patient is a 48 year old male with the above-stated past psychiatric history who is seen in follow-up.  Diagnosis: #1 major depression, recurrent, severe without psychotic features, #2 hypertension, #3 diabetes mellitus type 2  Patient is seen in follow-up.  He stated he did not feel as though he slept as well last night as the night before.  He stated he got 2 pills the night before but only one last night.  I am sure the second pill was probably the trazodone.  His blood sugar this morning  is elevated at 223, and his blood pressure is elevated at 143/92.  I am going to increase his losartan to 100 mg p.o. daily.  We will continue the carvedilol 25 mg p.o. twice daily.  He is on Metformin at 1000 mg p.o. twice daily for his diabetes, and glipizide was started yesterday 10 mg p.o. twice daily.  He also continues on the sliding scale.  It also looks like on his last admission on the psychiatric unit on 05/29/2018 he had been placed on Lantus insulin 10 units subcu daily.  I am going to restart Lantus at 10 units subcu q. at bedtime.  Hopefully that will improve his diabetic control as well.  No other changes in medications at this point.  1.  Continue coated aspirin 81 mg p.o. daily for heart health. 2.  Continue sliding scale NovoLog  insulin. 3.  Continue Metformin at 1000 mg p.o. twice daily for diabetes mellitus type 2. 4.  Continue mirtazapine 15 mg p.o. nightly for mood, anxiety and sleep. 5.    Continue carvedilol 25 mg p.o. twice daily for hypertension. 6.    Continue glipizide 10 mg p.o. twice daily for diabetes mellitus type 2 7.    Increase losartan to 100 mg p.o. daily for hypertension. 8.    Continue Lipitor 10 mg p.o. daily for hyperlipidemia. 9.    Add Lantus insulin 10 units subcu nightly for diabetes mellitus type 2 10.  Continue to follow blood sugars 4 times daily. 11.   Re-add trazodone 50 mg p.o. nightly as needed insomnia. 12.  Disposition planning-in progress. Sharma Covert, MD 02/07/2019, 11:00 AM

## 2019-02-08 DIAGNOSIS — F332 Major depressive disorder, recurrent severe without psychotic features: Secondary | ICD-10-CM | POA: Diagnosis not present

## 2019-02-08 LAB — GLUCOSE, CAPILLARY
Glucose-Capillary: 158 mg/dL — ABNORMAL HIGH (ref 70–99)
Glucose-Capillary: 242 mg/dL — ABNORMAL HIGH (ref 70–99)
Glucose-Capillary: 170 mg/dL — ABNORMAL HIGH (ref 70–99)
Glucose-Capillary: 154 mg/dL — ABNORMAL HIGH (ref 70–99)

## 2019-02-08 MED ORDER — MIRTAZAPINE 15 MG PO TABS
30.0000 mg | ORAL_TABLET | Freq: Every day | ORAL | Status: DC
  Administered 2019-02-08 – 2019-02-09 (×2): 30 mg via ORAL
  Filled 2019-02-08 (×2): qty 2

## 2019-02-08 NOTE — Progress Notes (Signed)
Recreation Therapy Notes    Date: 02/08/2019  Time: 9:30 am  Location: Craft room  Behavioral response: Appropriate   Intervention Topic: Goals   Discussion/Intervention:   Group content on today was focused on goals. Patients described what goals are and how they define goals. Individuals expressed how they go about setting goals and reaching them. The group identified how important goals are and if they make short term goals to reach long term goals. Patients described how many goals they work on at a time and what affects them not reaching their goal. Individuals described how much time they put into planning and obtaining their goals. The group participated in the intervention "My Goal Board" and made personal goal boards to help them achieve their goal. Clinical Observations/Feedback:  Patient came to group late due to unknown reasons. Individual was social with peers and staff while participating in group.  Airam Runions LRT/CTRS         Wood Novacek 02/08/2019 11:53 AM

## 2019-02-08 NOTE — Progress Notes (Signed)
Inpatient Diabetes Program Recommendations  AACE/ADA: New Consensus Statement on Inpatient Glycemic Control   Target Ranges:  Prepandial:   less than 140 mg/dL      Peak postprandial:   less than 180 mg/dL (1-2 hours)      Critically ill patients:  140 - 180 mg/dL   Results for Christian [name], Christian [name] (MRN [id number]) as of 02/08/2019 10:47  Ref. Range 02/07/2019 07:00 02/07/2019 11:19 02/07/2019 16:15 02/07/2019 21:08 02/08/2019 07:01  Glucose-Capillary Latest Ref Range: 70 - 99 mg/dL 223 (H) 256 (H) 163 (H) 212 (H) 158 (H)   Review of Glycemic Control  Diabetes history: DM2 Outpatient Diabetes medications: Metformin 1000 mg BID, Glipizide 10 mg BID Current orders for Inpatient glycemic control: Lantus 10 units QHS, Novolog 0-15 units TID with meals, Glipizide 10 mg BID, Metformin 1000 mg BID  Inpatient Diabetes Program Recommendations:   Insulin - Basal: In reviewing chart, noted NO Lantus given on 02/05/19 or 02/06/19. Therefore, glucose trending higher on 02/07/19. Patient did receive Lantus last night and fasting glucose 158 mg/dl today. No recommendations for Lantus at this time.  Thanks, Barnie Alderman, RN, MSN, CDE Diabetes Coordinator Inpatient Diabetes Program [phone] (Team Pager from 8am to 5pm)

## 2019-02-08 NOTE — Plan of Care (Signed)
D- Patient alert and oriented. Patient presents in a pleasant mood on assessment reporting that he slept good last night and he had no complaints to voice to this Probation officer. Patient denies SI, HI, AVH, and pain at this time. Patient also denies any signs/symptoms of depression/anxiety. Patient's goal for today is "going home", in which "groups/counseling" will help him achieve his goal.  A- Scheduled medications administered to patient, per MD orders. Support and encouragement provided.  Routine safety checks conducted every 15 minutes.  Patient informed to notify staff with problems or concerns.  R- No adverse drug reactions noted. Patient contracts for safety at this time. Patient compliant with medications and treatment plan. Patient receptive, calm, and cooperative. Patient interacts well with others on the unit.  Patient remains safe at this time.  Problem: Education: Goal: Knowledge of [place] [facility] Education information/materials will improve Outcome: Progressing Goal: Emotional status will improve Outcome: Progressing Goal: Mental status will improve Outcome: Progressing Goal: Verbalization of understanding the information provided will improve Outcome: Progressing   Problem: Safety: Goal: Periods of time without injury will increase Outcome: Progressing   Problem: Education: Goal: Ability to make informed decisions regarding treatment will improve Outcome: Progressing   Problem: Coping: Goal: Coping ability will improve Outcome: Progressing

## 2019-02-08 NOTE — Plan of Care (Signed)
Patient is interact well and cooperating with his medical regimen, patient ia pleasant upon approach and responding well to treatment, no outburst , and has not complained , appetite is good , sleep is restful , patient denies SI/HI/AVH, has not requested PRNs , routine visual check is maintained no distress.education is provided.   Problem: Education: Goal: Knowledge of [place] [facility] Education information/materials will improve Outcome: Progressing Goal: Emotional status will improve Outcome: Progressing Goal: Mental status will improve Outcome: Progressing Goal: Verbalization of understanding the information provided will improve Outcome: Progressing   Problem: Safety: Goal: Periods of time without injury will increase Outcome: Progressing   Problem: Education: Goal: Ability to make informed decisions regarding treatment will improve Outcome: Progressing   Problem: Coping: Goal: Coping ability will improve Outcome: Progressing

## 2019-02-08 NOTE — Progress Notes (Signed)
[facility] MD Progress Note  02/08/2019 5:07 PM Christian [name]  MRN:  [id number] Subjective: Patient seen chart reviewed.  Patient says he is feeling a little better.  He has more confidence that he can manage confronting his mother about her behavior.  Denies any current suicidal thoughts.  Still anxious and depressed part of the time with trouble getting himself motivated.  No complaints about medicine. Principal Problem: Severe episode of recurrent major depressive disorder, without psychotic features ([place]) Diagnosis: Principal Problem:   Severe episode of recurrent major depressive disorder, without psychotic features ([place]) Active Problems:   HTN (hypertension)   Diabetes mellitus, type II ([place])   Suicidal ideation   Major depression  Total Time spent with patient: 30 minutes  Past Psychiatric History: Past history of recurrent depression  Past Medical History:  Past Medical History:  Diagnosis Date  . CAD (coronary artery disease)    a. 07/2013 NSTEMI/PCI: LCX 6m (4.0x23 Xience DES), RCA 16m, EF > 55%;  b. 07/2013 Echo: EF 60-65%, diast dysfxn, mild LVH, mildly dil LA, nl RV size/fxn, nl RVSP.  Marland Kitchen History of tobacco abuse    a. quit ~ 2010  . Hyperlipidemia   . Morbid obesity ([place])    a. weighed 168 @ age 50.  . Scoliosis     Past Surgical History:  Procedure Laterality Date  . ADENOIDECTOMY    . CARDIAC CATHETERIZATION  07/2013   [facility]'x1 stent   Family History:  Family History  Problem Relation Age of Onset  . Heart disease Mother   . Esophageal cancer Father    Family Psychiatric  History: See previous Social History:  Social History   Substance and Sexual Activity  Alcohol Use Yes     Social History   Substance and Sexual Activity  Drug Use No  . Types: Marijuana   Comment: past    Social History   Socioeconomic History  . Marital status: Single    Spouse name: Not on file  . Number of children: Not on file  . Years of education: Not on file  . Highest  education level: Not on file  Occupational History  . Not on file  Social Needs  . Financial resource strain: Not on file  . Food insecurity    Worry: Not on file    Inability: Not on file  . Transportation needs    Medical: Not on file    Non-medical: Not on file  Tobacco Use  . Smoking status: Former Smoker    Packs/day: 0.50    Years: 20.00    Pack years: 10.00    Types: Cigarettes  . Smokeless tobacco: Never Used  Substance and Sexual Activity  . Alcohol use: Yes  . Drug use: No    Types: Marijuana    Comment: past  . Sexual activity: Not on file  Lifestyle  . Physical activity    Days per week: Not on file    Minutes per session: Not on file  . Stress: Not on file  Relationships  . Social Herbalist on phone: Not on file    Gets together: Not on file    Attends religious service: Not on file    Active member of club or organization: Not on file    Attends meetings of clubs or organizations: Not on file    Relationship status: Not on file  Other Topics Concern  . Not on file  Social History Narrative  . Not on  file   Additional Social History:                         Sleep: Fair  Appetite:  Fair  Current Medications: Current Facility-Administered Medications  Medication Dose Route Frequency Provider Last Rate Last Dose  . acetaminophen (TYLENOL) tablet 650 mg  650 mg Oral Q6H PRN Cristofano, Dorene Ar, MD      . alum & mag hydroxide-simeth (MAALOX/MYLANTA) 200-200-20 MG/5ML suspension 30 mL  30 mL Oral Q4H PRN Cristofano, Paul A, MD      . aspirin chewable tablet 81 mg  81 mg Oral Daily Cristofano, Dorene Ar, MD   81 mg at 02/08/19 0759  . atorvastatin (LIPITOR) tablet 10 mg  10 mg Oral q1800 Sharma Covert, MD   10 mg at 02/08/19 1705  . carvedilol (COREG) tablet 25 mg  25 mg Oral BID WC Sharma Covert, MD   25 mg at 02/08/19 1705  . gabapentin (NEURONTIN) capsule 200 mg  200 mg Oral TID Sharma Covert, MD   200 mg at 02/08/19  1705  . glipiZIDE (GLUCOTROL) tablet 10 mg  10 mg Oral BID AC Sharma Covert, MD   10 mg at 02/08/19 1638  . insulin aspart (novoLOG) injection 0-15 Units  0-15 Units Subcutaneous TID WC Azaleah Usman, Madie Reno, MD   3 Units at 02/08/19 1638  . insulin glargine (LANTUS) injection 10 Units  10 Units Subcutaneous QHS Sharma Covert, MD   10 Units at 02/07/19 2139  . losartan (COZAAR) tablet 100 mg  100 mg Oral Daily Sharma Covert, MD   100 mg at 02/08/19 0759  . magnesium hydroxide (MILK OF MAGNESIA) suspension 30 mL  30 mL Oral Daily PRN Cristofano, Paul A, MD      . metFORMIN (GLUCOPHAGE) tablet 1,000 mg  1,000 mg Oral BID WC Tyasia Packard T, MD   1,000 mg at 02/08/19 1705  . mirtazapine (REMERON) tablet 30 mg  30 mg Oral QHS Josseline Reddin T, MD      . traZODone (DESYREL) tablet 50 mg  50 mg Oral QHS PRN Sharma Covert, MD        Lab Results:  Results for orders placed or performed during the hospital encounter of 02/05/19 (from the past 48 hour(s))  Glucose, capillary     Status: Abnormal   Collection Time: 02/06/19  8:53 PM  Result Value Ref Range   Glucose-Capillary 146 (H) 70 - 99 mg/dL   Comment 1 Notify RN   Glucose, capillary     Status: Abnormal   Collection Time: 02/07/19  7:00 AM  Result Value Ref Range   Glucose-Capillary 223 (H) 70 - 99 mg/dL   Comment 1 Notify RN   Glucose, capillary     Status: Abnormal   Collection Time: 02/07/19 11:19 AM  Result Value Ref Range   Glucose-Capillary 256 (H) 70 - 99 mg/dL  Glucose, capillary     Status: Abnormal   Collection Time: 02/07/19  4:15 PM  Result Value Ref Range   Glucose-Capillary 163 (H) 70 - 99 mg/dL  Glucose, capillary     Status: Abnormal   Collection Time: 02/07/19  9:08 PM  Result Value Ref Range   Glucose-Capillary 212 (H) 70 - 99 mg/dL  Glucose, capillary     Status: Abnormal   Collection Time: 02/08/19  7:01 AM  Result Value Ref Range   Glucose-Capillary 158 (H) 70 - 99 mg/dL  Comment 1 Notify RN    Glucose, capillary     Status: Abnormal   Collection Time: 02/08/19 11:02 AM  Result Value Ref Range   Glucose-Capillary 242 (H) 70 - 99 mg/dL  Glucose, capillary     Status: Abnormal   Collection Time: 02/08/19  4:12 PM  Result Value Ref Range   Glucose-Capillary 170 (H) 70 - 99 mg/dL    Blood Alcohol level:  Lab Results  Component Value Date   ETH <10 02/03/2019   ETH <10 [id number]    Metabolic Disorder Labs: Lab Results  Component Value Date   HGBA1C 9.4 (H) 02/05/2019   MPG 223.08 02/05/2019   MPG 217.34 05/30/2018   No results found for: PROLACTIN Lab Results  Component Value Date   CHOL 243 (H) 02/05/2019   TRIG 234 (H) 02/05/2019   HDL 55 02/05/2019   CHOLHDL 4.4 02/05/2019   VLDL 47 (H) 02/05/2019   LDLCALC 141 (H) 02/05/2019   LDLCALC UNABLE TO CALCULATE IF TRIGLYCERIDE OVER 400 mg/dL 05/30/2018    Physical Findings: AIMS:  , ,  ,  ,    CIWA:    COWS:     Musculoskeletal: Strength & Muscle Tone: within normal limits Gait & Station: normal Patient leans: N/A  Psychiatric Specialty Exam: Physical Exam  Nursing note and vitals reviewed. Constitutional: He appears well-developed and well-nourished.  HENT:  Head: Normocephalic and atraumatic.  Eyes: Pupils are equal, round, and reactive to light. Conjunctivae are normal.  Neck: Normal range of motion.  Cardiovascular: Regular rhythm and normal heart sounds.  Respiratory: Effort normal. No respiratory distress.  GI: Soft.  Musculoskeletal: Normal range of motion.  Neurological: He is alert.  Skin: Skin is warm and dry.  Psychiatric: Judgment normal. His affect is blunt. His speech is delayed. He is slowed. Cognition and memory are impaired. He expresses no homicidal and no suicidal ideation.    Review of Systems  Constitutional: Negative.   HENT: Negative.   Eyes: Negative.   Respiratory: Negative.   Cardiovascular: Negative.   Gastrointestinal: Negative.   Musculoskeletal: Negative.   Skin:  Negative.   Neurological: Negative.   Psychiatric/Behavioral: Positive for depression. Negative for hallucinations, memory loss, substance abuse and suicidal ideas. The patient is nervous/anxious. The patient does not have insomnia.     Blood pressure (!) 145/92, pulse 85, temperature 98.4 F (36.9 C), temperature source Oral, resp. rate 18, height 5\' 6"  (1.676 m), weight 74.8 kg, SpO2 100 %.Body mass index is 26.62 kg/m.  General Appearance: Casual  Eye Contact:  Good  Speech:  Blocked  Volume:  Decreased  Mood:  Euthymic  Affect:  Congruent  Thought Process:  Goal Directed  Orientation:  Full (Time, Place, and Person)  Thought Content:  Logical  Suicidal Thoughts:  No  Homicidal Thoughts:  No  Memory:  Immediate;   Fair Recent;   Fair Remote;   Fair  Judgement:  Fair  Insight:  Fair  Psychomotor Activity:  Normal  Concentration:  Concentration: Fair  Recall:  [employer] of Knowledge:  Fair  Language:  Fair  Akathisia:  No  Handed:  Right  AIMS (if indicated):     Assets:  Desire for Improvement Housing Physical Health Resilience Social Support  ADL's:  Intact  Cognition:  WNL  Sleep:  Number of Hours: 8     Treatment Plan Summary: Daily contact with patient to assess and evaluate symptoms and progress in treatment, Medication management and Plan Supportive counseling and  therapy.  Review of medication and plan.  I notice he is only on 15 mg of mirtazapine which is a subtherapeutic dose so I will raise that to 30.  We are discussing likely discharge on Wednesday.  Alethia Berthold, MD 02/08/2019, 5:07 PM

## 2019-02-08 NOTE — [facility] Group Notes (Signed)
LCSW Group Therapy Note   02/08/2019 10:56 AM   Type of Therapy and Topic:  Group Therapy:  Overcoming Obstacles   Participation Level:  Minimal   Description of Group:    In this group patients will be encouraged to explore what they see as obstacles to their own wellness and recovery. They will be guided to discuss their thoughts, feelings, and behaviors related to these obstacles. The group will process together ways to cope with barriers, with attention given to specific choices patients can make. Each patient will be challenged to identify changes they are motivated to make in order to overcome their obstacles. This group will be process-oriented, with patients participating in exploration of their own experiences as well as giving and receiving support and challenge from other group members.   Therapeutic Goals: 1. Patient will identify personal and current obstacles as they relate to admission. 2. Patient will identify barriers that currently interfere with their wellness or overcoming obstacles.  3. Patient will identify feelings, thought process and behaviors related to these barriers. 4. Patient will identify two changes they are willing to make to overcome these obstacles:      Summary of Patient Progress Pt was present in group but did not really engage in the group discussion. Pt reported that he does not have any hobbies because he does not have time for any at this time with taking care of his mother and trying to find a job.     Therapeutic Modalities:   Cognitive Behavioral Therapy Solution Focused Therapy Motivational Interviewing Relapse Prevention Therapy  Evalina Field, MSW, LCSW Clinical Social Work 02/08/2019 10:56 AM

## 2019-02-09 DIAGNOSIS — F332 Major depressive disorder, recurrent severe without psychotic features: Secondary | ICD-10-CM | POA: Diagnosis not present

## 2019-02-09 LAB — GLUCOSE, CAPILLARY
Glucose-Capillary: 219 mg/dL — ABNORMAL HIGH (ref 70–99)
Glucose-Capillary: 252 mg/dL — ABNORMAL HIGH (ref 70–99)
Glucose-Capillary: 186 mg/dL — ABNORMAL HIGH (ref 70–99)
Glucose-Capillary: 118 mg/dL — ABNORMAL HIGH (ref 70–99)
Glucose-Capillary: 252 mg/dL — ABNORMAL HIGH (ref 70–99)

## 2019-02-09 MED ORDER — CARVEDILOL 25 MG PO TABS: 25.0000 mg | ORAL_TABLET | Freq: Two times a day (BID) | ORAL | 0 refills | Status: DC

## 2019-02-09 MED ORDER — GLIPIZIDE 10 MG PO TABS: 10.0000 mg | ORAL_TABLET | Freq: Two times a day (BID) | ORAL | 0 refills | Status: DC

## 2019-02-09 MED ORDER — GABAPENTIN 100 MG PO CAPS: 200.0000 mg | ORAL_CAPSULE | Freq: Three times a day (TID) | ORAL | 0 refills | Status: DC

## 2019-02-09 MED ORDER — METFORMIN HCL 1000 MG PO TABS: 1000.0000 mg | ORAL_TABLET | Freq: Two times a day (BID) | ORAL | 0 refills | Status: DC

## 2019-02-09 MED ORDER — MIRTAZAPINE 30 MG PO TABS: 30.0000 mg | ORAL_TABLET | Freq: Every day | ORAL | 0 refills | Status: DC

## 2019-02-09 MED ORDER — ASPIRIN 81 MG PO CHEW: 81.0000 mg | CHEWABLE_TABLET | Freq: Every day | ORAL | 0 refills | Status: DC

## 2019-02-09 MED ORDER — INSULIN GLARGINE 100 UNIT/ML ~~LOC~~ SOLN: 10.0000 [IU] | Freq: Every day | SUBCUTANEOUS | 0 refills | Status: DC

## 2019-02-09 MED ORDER — LOSARTAN POTASSIUM 100 MG PO TABS: 100.0000 mg | ORAL_TABLET | Freq: Every day | ORAL | 0 refills | Status: DC

## 2019-02-09 MED ORDER — TRAZODONE HCL 50 MG PO TABS: 50.0000 mg | ORAL_TABLET | Freq: Every evening | ORAL | 0 refills | Status: DC | PRN

## 2019-02-09 MED ORDER — ATORVASTATIN CALCIUM 10 MG PO TABS: 10.0000 mg | ORAL_TABLET | Freq: Every day | ORAL | 0 refills | Status: DC

## 2019-02-09 NOTE — Plan of Care (Signed)
D- Patient alert and oriented. Patient presented in a pleasant mood on assessment stating that he slept good last night and had no complaints/concerns to voice. Patient denied SI, HI, AVH, and pain at this time. Patient also denied any signs/symptoms of depression/anxiety to this writer, although, he reported an anxiety level of "2/10" on his self-inventory. Patient's goal for today is "going home", in which he will attend groups in order to achieve his goal.   A- Scheduled medications administered to patient, per MD orders. Support and encouragement provided.  Routine safety checks conducted every 15 minutes.  Patient informed to notify staff with problems or concerns.  R- No adverse drug reactions noted. Patient contracts for safety at this time. Patient compliant with medications and treatment plan. Patient receptive, calm, and cooperative. Patient interacts well with others on the unit.  Patient remains safe at this time.  Problem: Education: Goal: Knowledge of [facility] Education information/materials will improve Outcome: Progressing Goal: Emotional status will improve Outcome: Progressing Goal: Mental status will improve Outcome: Progressing Goal: Verbalization of understanding the information provided will improve Outcome: Progressing   Problem: Safety: Goal: Periods of time without injury will increase Outcome: Progressing   Problem: Education: Goal: Ability to make informed decisions regarding treatment will improve Outcome: Progressing   Problem: Coping: Goal: Coping ability will improve Outcome: Progressing

## 2019-02-09 NOTE — [facility] Suicide Risk Assessment (Signed)
[facility] Discharge Suicide Risk Assessment   Principal Problem: Severe episode of recurrent major depressive disorder, without psychotic features ([place]) Discharge Diagnoses: Principal Problem:   Severe episode of recurrent major depressive disorder, without psychotic features ([place]) Active Problems:   HTN (hypertension)   Diabetes mellitus, type II ([place])   Suicidal ideation   Major depression   Total Time spent with patient: 30 minutes  Musculoskeletal: Strength & Muscle Tone: within normal limits Gait & Station: normal Patient leans: N/A  Psychiatric Specialty Exam: Review of Systems  Constitutional: Negative.   HENT: Negative.   Eyes: Negative.   Respiratory: Negative.   Cardiovascular: Negative.   Gastrointestinal: Negative.   Musculoskeletal: Negative.   Skin: Negative.   Neurological: Negative.   Psychiatric/Behavioral: Negative.     Blood pressure 114/83, pulse 89, temperature 98.5 F (36.9 C), temperature source Oral, resp. rate 18, height 5\' 6"  (1.676 m), weight 74.8 kg, SpO2 100 %.Body mass index is 26.62 kg/m.  General Appearance: Casual  Eye Contact::  Good  Speech:  Clear and [id number]  Volume:  Normal  Mood:  Euthymic  Affect:  Congruent  Thought Process:  Goal Directed  Orientation:  Full (Time, Place, and Person)  Thought Content:  Logical  Suicidal Thoughts:  No  Homicidal Thoughts:  No  Memory:  Immediate;   Fair Recent;   Fair Remote;   Fair  Judgement:  Fair  Insight:  Fair  Psychomotor Activity:  Normal  Concentration:  Fair  Recall:  [employer] of Knowledge:Fair  Language: Fair  Akathisia:  No  Handed:  Right  AIMS (if indicated):     Assets:  Desire for Improvement Housing Physical Health Social Support  Sleep:  Number of Hours: 6.75  Cognition: WNL  ADL's:  Intact   Mental Status Per Nursing Assessment::   On Admission:  Suicidal ideation indicated by patient, Suicide plan  Demographic Factors:  Male, Divorced or widowed,  Caucasian and Unemployed  Loss Factors: Decline in physical health  Historical Factors: Impulsivity  Risk Reduction Factors:   Sense of responsibility to family, Religious beliefs about death, Positive social support and Positive therapeutic relationship  Continued Clinical Symptoms:  Depression:   Impulsivity  Cognitive Features That Contribute To Risk:  None    Suicide Risk:  Minimal: No identifiable suicidal ideation.  Patients presenting with no risk factors but with morbid ruminations; may be classified as minimal risk based on the severity of the depressive symptoms  Follow-up Information    [facility] Follow up on 02/12/2019.   Why: You have a zoom meeting with Sherrian Divers on Friday, November 20th at 7am. Thank you. Contact information: [place] [id number] [phone]           Plan Of Care/Follow-up recommendations:  Activity:  Activity as tolerated Diet:  Carb controlled diet Other:  Follow-up with [facility]  Alethia Berthold, MD 02/09/2019, 6:19 PM

## 2019-02-09 NOTE — Progress Notes (Signed)
[facility] MD Progress Note  02/09/2019 6:15 PM Christian [name]  MRN:  [id number] Subjective: Follow-up for this patient with depression.  Patient seen chart reviewed.  Patient says he is feeling significantly better.  Slept okay at night.  Feeling much calmer.  Denies any current suicidal ideation.  Feels more confident about outpatient treatment in about interacting with his mother. Principal Problem: Severe episode of recurrent major depressive disorder, without psychotic features ([place]) Diagnosis: Principal Problem:   Severe episode of recurrent major depressive disorder, without psychotic features ([place]) Active Problems:   HTN (hypertension)   Diabetes mellitus, type II ([place])   Suicidal ideation   Major depression  Total Time spent with patient: 30 minutes  Past Psychiatric History: Past history of recurrent depression and suicide attempts  Past Medical History:  Past Medical History:  Diagnosis Date  . CAD (coronary artery disease)    a. 07/2013 NSTEMI/PCI: LCX 20m (4.0x23 Xience DES), RCA 9m, EF > 55%;  b. 07/2013 Echo: EF 60-65%, diast dysfxn, mild LVH, mildly dil LA, nl RV size/fxn, nl RVSP.  Marland Kitchen History of tobacco abuse    a. quit ~ 2010  . Hyperlipidemia   . Morbid obesity ([place])    a. weighed 168 @ age 27.  . Scoliosis     Past Surgical History:  Procedure Laterality Date  . ADENOIDECTOMY    . CARDIAC CATHETERIZATION  07/2013   [facility]'x1 stent   Family History:  Family History  Problem Relation Age of Onset  . Heart disease Mother   . Esophageal cancer Father    Family Psychiatric  History: See previous Social History:  Social History   Substance and Sexual Activity  Alcohol Use Yes     Social History   Substance and Sexual Activity  Drug Use No  . Types: Marijuana   Comment: past    Social History   Socioeconomic History  . Marital status: Single    Spouse name: Not on file  . Number of children: Not on file  . Years of education: Not on file  . Highest  education level: Not on file  Occupational History  . Not on file  Social Needs  . Financial resource strain: Not on file  . Food insecurity    Worry: Not on file    Inability: Not on file  . Transportation needs    Medical: Not on file    Non-medical: Not on file  Tobacco Use  . Smoking status: Former Smoker    Packs/day: 0.50    Years: 20.00    Pack years: 10.00    Types: Cigarettes  . Smokeless tobacco: Never Used  Substance and Sexual Activity  . Alcohol use: Yes  . Drug use: No    Types: Marijuana    Comment: past  . Sexual activity: Not on file  Lifestyle  . Physical activity    Days per week: Not on file    Minutes per session: Not on file  . Stress: Not on file  Relationships  . Social Herbalist on phone: Not on file    Gets together: Not on file    Attends religious service: Not on file    Active member of club or organization: Not on file    Attends meetings of clubs or organizations: Not on file    Relationship status: Not on file  Other Topics Concern  . Not on file  Social History Narrative  . Not on file  Additional Social History:                         Sleep: Fair  Appetite:  Fair  Current Medications: Current Facility-Administered Medications  Medication Dose Route Frequency Provider Last Rate Last Dose  . acetaminophen (TYLENOL) tablet 650 mg  650 mg Oral Q6H PRN Cristofano, Dorene Ar, MD      . alum & mag hydroxide-simeth (MAALOX/MYLANTA) 200-200-20 MG/5ML suspension 30 mL  30 mL Oral Q4H PRN Cristofano, Paul A, MD      . aspirin chewable tablet 81 mg  81 mg Oral Daily Cristofano, Dorene Ar, MD   81 mg at 02/09/19 0832  . atorvastatin (LIPITOR) tablet 10 mg  10 mg Oral q1800 Sharma Covert, MD   10 mg at 02/09/19 1734  . carvedilol (COREG) tablet 25 mg  25 mg Oral BID WC Sharma Covert, MD   25 mg at 02/09/19 1734  . gabapentin (NEURONTIN) capsule 200 mg  200 mg Oral TID Sharma Covert, MD   200 mg at 02/09/19  1733  . glipiZIDE (GLUCOTROL) tablet 10 mg  10 mg Oral BID AC Sharma Covert, MD   10 mg at 02/09/19 1734  . insulin aspart (novoLOG) injection 0-15 Units  0-15 Units Subcutaneous TID WC Clapacs, Madie Reno, MD   3 Units at 02/09/19 1203  . insulin glargine (LANTUS) injection 10 Units  10 Units Subcutaneous QHS Sharma Covert, MD   10 Units at 02/08/19 2148  . losartan (COZAAR) tablet 100 mg  100 mg Oral Daily Sharma Covert, MD   100 mg at 02/09/19 [id number]  . magnesium hydroxide (MILK OF MAGNESIA) suspension 30 mL  30 mL Oral Daily PRN Cristofano, Paul A, MD      . metFORMIN (GLUCOPHAGE) tablet 1,000 mg  1,000 mg Oral BID WC Clapacs, Madie Reno, MD   1,000 mg at 02/09/19 1734  . mirtazapine (REMERON) tablet 30 mg  30 mg Oral QHS Clapacs, Madie Reno, MD   30 mg at 02/08/19 2146  . traZODone (DESYREL) tablet 50 mg  50 mg Oral QHS PRN Sharma Covert, MD        Lab Results:  Results for orders placed or performed during the hospital encounter of 02/05/19 (from the past 48 hour(s))  Glucose, capillary     Status: Abnormal   Collection Time: 02/07/19  9:08 PM  Result Value Ref Range   Glucose-Capillary 212 (H) 70 - 99 mg/dL  Glucose, capillary     Status: Abnormal   Collection Time: 02/08/19  7:01 AM  Result Value Ref Range   Glucose-Capillary 158 (H) 70 - 99 mg/dL   Comment 1 Notify RN   Glucose, capillary     Status: Abnormal   Collection Time: 02/08/19 11:02 AM  Result Value Ref Range   Glucose-Capillary 242 (H) 70 - 99 mg/dL  Glucose, capillary     Status: Abnormal   Collection Time: 02/08/19  4:12 PM  Result Value Ref Range   Glucose-Capillary 170 (H) 70 - 99 mg/dL  Glucose, capillary     Status: Abnormal   Collection Time: 02/08/19  8:16 PM  Result Value Ref Range   Glucose-Capillary 154 (H) 70 - 99 mg/dL   Comment 1 Notify RN   Glucose, capillary     Status: Abnormal   Collection Time: 02/09/19  6:29 AM  Result Value Ref Range   Glucose-Capillary 219 (H) 70 - 99 mg/dL  Comment 1 Notify RN   Glucose, capillary     Status: Abnormal   Collection Time: 02/09/19  7:48 AM  Result Value Ref Range   Glucose-Capillary 252 (H) 70 - 99 mg/dL  Glucose, capillary     Status: Abnormal   Collection Time: 02/09/19 11:08 AM  Result Value Ref Range   Glucose-Capillary 186 (H) 70 - 99 mg/dL   Comment 1 Notify RN   Glucose, capillary     Status: Abnormal   Collection Time: 02/09/19  4:27 PM  Result Value Ref Range   Glucose-Capillary 118 (H) 70 - 99 mg/dL    Blood Alcohol level:  Lab Results  Component Value Date   ETH <10 02/03/2019   ETH <10 [id number]    Metabolic Disorder Labs: Lab Results  Component Value Date   HGBA1C 9.4 (H) 02/05/2019   MPG 223.08 02/05/2019   MPG 217.34 05/30/2018   No results found for: PROLACTIN Lab Results  Component Value Date   CHOL 243 (H) 02/05/2019   TRIG 234 (H) 02/05/2019   HDL 55 02/05/2019   CHOLHDL 4.4 02/05/2019   VLDL 47 (H) 02/05/2019   LDLCALC 141 (H) 02/05/2019   LDLCALC UNABLE TO CALCULATE IF TRIGLYCERIDE OVER 400 mg/dL 05/30/2018    Physical Findings: AIMS:  , ,  ,  ,    CIWA:    COWS:     Musculoskeletal: Strength & Muscle Tone: within normal limits Gait & Station: normal Patient leans: N/A  Psychiatric Specialty Exam: Physical Exam  Nursing note and vitals reviewed. Constitutional: He appears well-developed and well-nourished.  HENT:  Head: Normocephalic and atraumatic.  Eyes: Pupils are equal, round, and reactive to light. Conjunctivae are normal.  Neck: Normal range of motion.  Cardiovascular: Regular rhythm and normal heart sounds.  Respiratory: Effort normal. No respiratory distress.  GI: Soft.  Musculoskeletal: Normal range of motion.  Neurological: He is alert.  Skin: Skin is warm and dry.  Psychiatric: He has a normal mood and affect. His behavior is normal. Judgment and thought content normal.    Review of Systems  Constitutional: Negative.   HENT: Negative.   Eyes: Negative.    Respiratory: Negative.   Cardiovascular: Negative.   Gastrointestinal: Negative.   Musculoskeletal: Negative.   Skin: Negative.   Neurological: Negative.     Blood pressure 114/83, pulse 89, temperature 98.5 F (36.9 C), temperature source Oral, resp. rate 18, height 5\' 6"  (1.676 m), weight 74.8 kg, SpO2 100 %.Body mass index is 26.62 kg/m.  General Appearance: Casual  Eye Contact:  Good  Speech:  Clear and Coherent  Volume:  Normal  Mood:  Euthymic  Affect:  Constricted  Thought Process:  Coherent  Orientation:  Full (Time, Place, and Person)  Thought Content:  Logical  Suicidal Thoughts:  No  Homicidal Thoughts:  No  Memory:  Immediate;   Fair Recent;   Fair Remote;   Fair  Judgement:  Fair  Insight:  Fair  Psychomotor Activity:  Normal  Concentration:  Concentration: Fair  Recall:  [employer] of Knowledge:  Fair  Language:  Fair  Akathisia:  No  Handed:  Right  AIMS (if indicated):     Assets:  Desire for Improvement Housing Social Support  ADL's:  Intact  Cognition:  WNL  Sleep:  Number of Hours: 6.75     Treatment Plan Summary: Daily contact with patient to assess and evaluate symptoms and progress in treatment, Medication management and Plan Patient seems to have stabilized  and is no longer suicidal no longer hopeless.  Plan is for discharge tomorrow morning with follow-up through [place].  Alethia Berthold, MD 02/09/2019, 6:16 PM

## 2019-02-09 NOTE — Progress Notes (Signed)
D - Patient was in her room upon arrival to the unit. Patient was pleasant during assessment denying SI/HI/AVH, pain, anxiety and depression. Patient stated he was leaving tomorrow and that he feels ready to go.   A - Patient compliant with medication administration per MD orders and procedures on the unit. Patient given education. Patient given support and encouragement to be active in his treatment plan. Patient informed to let staff know if there are any issues or problems on the unit.   R - Patient being monitored Q 15 minutes for safety per unit protocol. Patient remains safe on the unit.

## 2019-02-09 NOTE — Plan of Care (Signed)
Patient stated he is ready to go and denies SI/HI/AVH  Problem: Education: Goal: Emotional status will improve Outcome: Progressing Goal: Mental status will improve Outcome: Progressing

## 2019-02-09 NOTE — Plan of Care (Signed)
Patient is responding well to treatment regimen, complying with therapy, adequate socialization with peers without issues, sleep is restful , patient express no concerns , denies any SI/HI/AVH , patient is coping well , and feeling good about his progress, education and support is provided and safety is maintained no distress.   Problem: Education: Goal: Knowledge of [place] [facility] Education information/materials will improve Outcome: Progressing Goal: Emotional status will improve Outcome: Progressing Goal: Mental status will improve Outcome: Progressing Goal: Verbalization of understanding the information provided will improve Outcome: Progressing   Problem: Safety: Goal: Periods of time without injury will increase Outcome: Progressing   Problem: Education: Goal: Ability to make informed decisions regarding treatment will improve Outcome: Progressing   Problem: Coping: Goal: Coping ability will improve Outcome: Progressing

## 2019-02-09 NOTE — Progress Notes (Signed)
Recreation Therapy Notes    Date: 02/09/2019  Time: 9:30 am  Location: Craft room  Behavioral response: Appropriate   Intervention Topic: Stress Management   Discussion/Intervention:   Group content on today was focused on stress. The group defined stress and way to cope with stress. Participants expressed how they know when they are stresses out. Individuals described the different ways they have to cope with stress. The group stated reasons why it is important to cope with stress. Patient explained what good stress is and some examples. The group participated in the intervention "Stress Management Jeopardy". Individuals were separated into two group and answered questions related to stress.  Clinical Observations/Feedback:  Patient came to group late due to unknown reasons. He participated in the intervention and was social with peers and staff. Hadas Jessop LRT/CTRS         Vonya Ohalloran 02/09/2019 11:19 AM

## 2019-02-09 NOTE — [facility] Group Notes (Signed)
LCSW Group Therapy Note  02/09/2019 1:00 PM  Type of Therapy/Topic:  Group Therapy:  Feelings about Diagnosis  Participation Level:  Active   Description of Group:   This group will allow patients to explore their thoughts and feelings about diagnoses they have received. Patients will be guided to explore their level of understanding and acceptance of these diagnoses. Facilitator will encourage patients to process their thoughts and feelings about the reactions of others to their diagnosis and will guide patients in identifying ways to discuss their diagnosis with significant others in their lives. This group will be process-oriented, with patients participating in exploration of their own experiences, giving and receiving support, and processing challenge from other group members.   Therapeutic Goals: 1. Patient will demonstrate understanding of diagnosis as evidenced by identifying two or more symptoms of the disorder 2. Patient will be able to express two feelings regarding the diagnosis 3. Patient will demonstrate their ability to communicate their needs through discussion and/or role play  Summary of Patient Progress: Patient was present and an active participant in group.  Patient shared his understanding of the diagnoses presented. Patient attempted to support group members by explaining the diagnosis to them and providing them with examples.  Patient was able to process his thoughts and feelings in group.    Therapeutic Modalities:   Cognitive Behavioral Therapy Brief Therapy Feelings Identification   Assunta Curtis, MSW, LCSW 02/09/2019 11:48 AM

## 2019-02-10 LAB — GLUCOSE, CAPILLARY: Glucose-Capillary: 147 mg/dL — ABNORMAL HIGH (ref 70–99)

## 2019-02-10 MED ORDER — ATORVASTATIN CALCIUM 10 MG PO TABS: 10.0000 mg | ORAL_TABLET | Freq: Every day | ORAL | 1 refills | Status: DC

## 2019-02-10 MED ORDER — ASPIRIN 81 MG PO CHEW: 81.0000 mg | CHEWABLE_TABLET | Freq: Every day | ORAL | 1 refills | Status: DC

## 2019-02-10 MED ORDER — METFORMIN HCL 1000 MG PO TABS: 1000.0000 mg | ORAL_TABLET | Freq: Two times a day (BID) | ORAL | 1 refills | Status: DC

## 2019-02-10 MED ORDER — TRAZODONE HCL 50 MG PO TABS: 50.0000 mg | ORAL_TABLET | Freq: Every evening | ORAL | 1 refills | Status: DC | PRN

## 2019-02-10 MED ORDER — GABAPENTIN 100 MG PO CAPS: 200.0000 mg | ORAL_CAPSULE | Freq: Three times a day (TID) | ORAL | 1 refills | Status: DC

## 2019-02-10 MED ORDER — GLIPIZIDE 10 MG PO TABS: 10.0000 mg | ORAL_TABLET | Freq: Two times a day (BID) | ORAL | 1 refills | Status: DC

## 2019-02-10 MED ORDER — MIRTAZAPINE 30 MG PO TABS: 30.0000 mg | ORAL_TABLET | Freq: Every day | ORAL | 1 refills | Status: DC

## 2019-02-10 MED ORDER — CARVEDILOL 25 MG PO TABS: 25.0000 mg | ORAL_TABLET | Freq: Two times a day (BID) | ORAL | 1 refills | Status: DC

## 2019-02-10 MED ORDER — INSULIN GLARGINE 100 UNIT/ML ~~LOC~~ SOLN: 10.0000 [IU] | Freq: Every day | SUBCUTANEOUS | 1 refills | Status: DC

## 2019-02-10 MED ORDER — LOSARTAN POTASSIUM 100 MG PO TABS: 100.0000 mg | ORAL_TABLET | Freq: Every day | ORAL | 1 refills | Status: DC

## 2019-02-10 NOTE — Plan of Care (Signed)
  Problem: Depression Goal: STG - Patient will identify 3 triggers for depression within 5 recreation therapy group sessions Description: STG - Patient will identify 3 triggers for depression within 5 recreation therapy group sessions 02/10/2019 1319 by Ernest Haber, LRT Outcome: Adequate for Discharge 02/10/2019 1319 by Ernest Haber, LRT Outcome: Adequate for Discharge

## 2019-02-10 NOTE — Discharge Summary (Signed)
Physician Discharge Summary Note  Patient:  Christian [name] is an 48 y.o., male MRN:  [id number] DOB:  1970/08/23 Patient phone:  [phone] (home)  Patient address:   [address] [place] [address],  Total Time spent with patient: 30 minutes  Date of Admission:  02/05/2019 Date of Discharge: 02/10/19  Reason for Admission:  48 year old male with history of depression who presents to the ED with complaints of depression and suicidal ideation.  Patient states that he was planning to overdose on pills in his car.  But he was also considering shooting himself with a gun.  He does not currently own a gun ,he had to pawn his weapon due to financial stress.  Patient states that these feelings have been present for a while but have gotten worse in the context of recent argument with his mother.  Patient states that he lives with his mother and is financially dependent on her at this time.  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  When his mom began to question his financial contribution, patient began to feel depressed about his life situation feeling that he has spent 50 years of his life and has been unable to have anything to show for it.  Patient states that in the past he has been admitted to psychiatric hospitals but he has not taken any psychiatric medication.  Principal Problem: Severe episode of recurrent major depressive disorder, without psychotic features [facility]) Discharge Diagnoses: Principal Problem:   Severe episode of recurrent major depressive disorder, without psychotic features ([place]) Active Problems:   HTN (hypertension)   Diabetes mellitus, type II ([place])   Suicidal ideation   Major depression   Past Psychiatric History: Patient has had at least 2 previous hospitalizations for psychiatric problems.  He was here in March of this year with similar situation except at that time he was drinking heavily on a daily basis.  He did follow-up with [facility] for  intensive outpatient treatment after his last hospitalization but eventually dropped out of treatment.  He has had another hospitalization a few years ago also for a suicide attempt.  Never has stayed compliant with antidepressants long enough to know if they are helpful.  Past Medical History:  Past Medical History:  Diagnosis Date  . CAD (coronary artery disease)    a. 07/2013 NSTEMI/PCI: LCX 44m (4.0x23 Xience DES), RCA 82m, EF > 55%;  b. 07/2013 Echo: EF 60-65%, diast dysfxn, mild LVH, mildly dil LA, nl RV size/fxn, nl RVSP.  Marland Kitchen History of tobacco abuse    a. quit ~ 2010  . Hyperlipidemia   . Morbid obesity ([place])    a. weighed 168 @ age 21.  . Scoliosis     Past Surgical History:  Procedure Laterality Date  . ADENOIDECTOMY    . CARDIAC CATHETERIZATION  07/2013   [facility]'x1 stent   Family History:  Family History  Problem Relation Age of Onset  . Heart disease Mother   . Esophageal cancer Father    Family Psychiatric  History: None reported Social History:  Social History   Substance and Sexual Activity  Alcohol Use Yes     Social History   Substance and Sexual Activity  Drug Use No  . Types: Marijuana   Comment: past    Social History   Socioeconomic History  . Marital status: Single    Spouse name: Not on file  . Number of children: Not on file  . Years of education:  Not on file  . Highest education level: Not on file  Occupational History  . Not on file  Social Needs  . Financial resource strain: Not on file  . Food insecurity    Worry: Not on file    Inability: Not on file  . Transportation needs    Medical: Not on file    Non-medical: Not on file  Tobacco Use  . Smoking status: Former Smoker    Packs/day: 0.50    Years: 20.00    Pack years: 10.00    Types: Cigarettes  . Smokeless tobacco: Never Used  Substance and Sexual Activity  . Alcohol use: Yes  . Drug use: No    Types: Marijuana    Comment: past  . Sexual activity: Not on file  Lifestyle   . Physical activity    Days per week: Not on file    Minutes per session: Not on file  . Stress: Not on file  Relationships  . Social Herbalist on phone: Not on file    Gets together: Not on file    Attends religious service: Not on file    Active member of club or organization: Not on file    Attends meetings of clubs or organizations: Not on file    Relationship status: Not on file  Other Topics Concern  . Not on file  Social History Narrative  . Not on file    Hospital Course:  Patient remained on the [facility] unit for 5 days. The patient stabilized on medication and therapy. Patient was discharged on aspirin chewable 81 mg p.o. daily, Lipitor 10 mg p.o daily at 1800, Coreg 25 mg p.o. twice daily, Neurontin 200 mg p.o. 3 times daily, glipizide 10 mg p.o. twice daily, Lantus 10 units nightly, Cozaar 100 mg p.o. daily, metformin 1000 mg p.o. twice daily, Remeron 30 mg p.o. nightly, and trazodone 50 mg p.o. nightly as needed. Patient has shown improvement with improved mood, affect, sleep, appetite, and interaction. Patient has attended group and participated. Patient has been seen in the day room interacting with peers and staff appropriately. Patient denies any SI/HI/AVH and contracts for safety. Patient agrees to follow up at [facility] health services. Patient is provided with samples and prescriptions for their medications upon discharge.  Physical Findings: AIMS:  , ,  ,  ,    CIWA:    COWS:     Musculoskeletal: Strength & Muscle Tone: within normal limits Gait & Station: normal Patient leans: N/A  Psychiatric Specialty Exam: Physical Exam  Nursing note and vitals reviewed. Constitutional: He is oriented to person, place, and time. He appears well-developed and well-nourished.  Cardiovascular: Normal rate.  Respiratory: Effort normal.  Musculoskeletal: Normal range of motion.  Neurological: He is alert and oriented to person, place, and time.  Skin: Skin is warm.     Review of Systems  Constitutional: Negative.   HENT: Negative.   Eyes: Negative.   Respiratory: Negative.   Cardiovascular: Negative.   Gastrointestinal: Negative.   Genitourinary: Negative.   Musculoskeletal: Negative.   Skin: Negative.   Neurological: Negative.   Endo/Heme/Allergies: Negative.   Psychiatric/Behavioral: Negative.     Blood pressure 117/85, pulse 76, temperature 98.5 F (36.9 C), temperature source Oral, resp. rate 17, height 5\' 6"  (1.676 m), weight 74.8 kg, SpO2 99 %.Body mass index is 26.62 kg/m.   General Appearance: Casual  Eye Contact::  Good  Speech:  Clear and Coherent409  Volume:  Normal  Mood:  Euthymic  Affect:  Congruent  Thought Process:  Goal Directed  Orientation:  Full (Time, Place, and Person)  Thought Content:  Logical  Suicidal Thoughts:  No  Homicidal Thoughts:  No  Memory:  Immediate;   Fair Recent;   Fair Remote;   Fair  Judgement:  Fair  Insight:  Fair  Psychomotor Activity:  Normal  Concentration:  Fair  Recall:  [place]  Language: Fair  Akathisia:  No  Handed:  Right  AIMS (if indicated):     Assets:  Desire for Improvement Housing Physical Health Social Support  Sleep:  Number of Hours: 6.75  Cognition: WNL  ADL's:  Intact      Have you used any form of tobacco in the last 30 days? (Cigarettes, Smokeless Tobacco, Cigars, and/or Pipes): No  Has this patient used any form of tobacco in the last 30 days? (Cigarettes, Smokeless Tobacco, Cigars, and/or Pipes) Yes, No  Blood Alcohol level:  Lab Results  Component Value Date   ETH <10 02/03/2019   ETH <10 [id number]    Metabolic Disorder Labs:  Lab Results  Component Value Date   HGBA1C 9.4 (H) 02/05/2019   MPG 223.08 02/05/2019   MPG 217.34 05/30/2018   No results found for: PROLACTIN Lab Results  Component Value Date   CHOL 243 (H) 02/05/2019   TRIG 234 (H) 02/05/2019   HDL 55 02/05/2019   CHOLHDL 4.4 02/05/2019   VLDL 47 (H)  02/05/2019   LDLCALC 141 (H) 02/05/2019   LDLCALC UNABLE TO CALCULATE IF TRIGLYCERIDE OVER 400 mg/dL 05/30/2018    See Psychiatric Specialty Exam and Suicide Risk Assessment completed by Attending Physician prior to discharge.  Discharge destination:  Home  Is patient on multiple antipsychotic therapies at discharge:  No   Has Patient had three or more failed trials of antipsychotic monotherapy by history:  No  Recommended Plan for Multiple Antipsychotic Therapies: NA  Discharge Instructions    Diet - low sodium heart healthy   Complete by: As directed    Increase activity slowly   Complete by: As directed      Allergies as of 02/10/2019   No Known Allergies     Medication List    TAKE these medications     Indication  aspirin 81 MG chewable tablet Chew 1 tablet (81 mg total) by mouth daily.  Indication: Stable Angina Pectoris, Coronary artery disease   atorvastatin 10 MG tablet Commonly known as: LIPITOR Take 1 tablet (10 mg total) by mouth daily at 6 PM.  Indication: High Amount of Fats in the Blood   carvedilol 25 MG tablet Commonly known as: COREG Take 1 tablet (25 mg total) by mouth 2 (two) times daily with a meal.  Indication: High Blood Pressure Disorder   gabapentin 100 MG capsule Commonly known as: NEURONTIN Take 2 capsules (200 mg total) by mouth 3 (three) times daily.  Indication: Abuse or Misuse of Alcohol, Fibromyalgia Syndrome   glipiZIDE 10 MG tablet Commonly known as: GLUCOTROL Take 1 tablet (10 mg total) by mouth 2 (two) times daily before a meal.  Indication: Type 2 Diabetes   insulin glargine 100 UNIT/ML injection Commonly known as: LANTUS Inject 0.1 mLs (10 Units total) into the skin at bedtime.  Indication: Type 2 Diabetes   losartan 100 MG tablet Commonly known as: COZAAR Take 1 tablet (100 mg total) by mouth daily.  Indication: High Blood Pressure Disorder   metFORMIN 1000 MG tablet Commonly  known as: GLUCOPHAGE Take 1 tablet  (1,000 mg total) by mouth 2 (two) times daily with a meal.  Indication: Type 2 Diabetes   mirtazapine 30 MG tablet Commonly known as: REMERON Take 1 tablet (30 mg total) by mouth at bedtime.  Indication: Major Depressive Disorder   traZODone 50 MG tablet Commonly known as: DESYREL Take 1 tablet (50 mg total) by mouth at bedtime as needed for sleep.  Indication: [facility] Follow up on 02/12/2019.   Why: You have a zoom meeting with Sherrian Divers on Friday, November 20th at 7am. Thank you. Contact information: [place] [address] [phone]           Follow-up recommendations:  Continue activity as tolerated. Continue diet as recommended by your PCP. Ensure to keep all appointments with outpatient providers.  Comments:  Patient is instructed prior to discharge to: Take all medications as prescribed by his/her mental healthcare provider. Report any adverse effects and or reactions from the medicines to his/her outpatient provider promptly. Patient has been instructed & cautioned: To not engage in alcohol and or illegal drug use while on prescription medicines. In the event of worsening symptoms, patient is instructed to call the crisis hotline, 911 and or go to the nearest ED for appropriate evaluation and treatment of symptoms. To follow-up with his/her primary care provider for your other medical issues, concerns and or health care needs.    Signed: [place], FNP 02/10/2019, 9:36 AM

## 2019-02-10 NOTE — Plan of Care (Signed)
  Problem: Education: Goal: Knowledge of [facility] Education information/materials will improve 02/10/2019 1003 by Leodis Liverpool, RN Outcome: Adequate for Discharge 02/10/2019 1003 by Leodis Liverpool, RN Outcome: Not Progressing Goal: Emotional status will improve 02/10/2019 1003 by Leodis Liverpool, RN Outcome: Adequate for Discharge 02/10/2019 1003 by Leodis Liverpool, RN Outcome: Not Progressing Goal: Mental status will improve 02/10/2019 1003 by Leodis Liverpool, RN Outcome: Adequate for Discharge 02/10/2019 1003 by Leodis Liverpool, RN Outcome: Not Progressing Goal: Verbalization of understanding the information provided will improve 02/10/2019 1003 by Leodis Liverpool, RN Outcome: Adequate for Discharge 02/10/2019 1003 by Leodis Liverpool, RN Outcome: Not Progressing   Problem: Safety: Goal: Periods of time without injury will increase 02/10/2019 1003 by Leodis Liverpool, RN Outcome: Adequate for Discharge 02/10/2019 1003 by Leodis Liverpool, RN Outcome: Not Progressing   Problem: Safety: Goal: Periods of time without injury will increase 02/10/2019 1003 by Leodis Liverpool, RN Outcome: Adequate for Discharge 02/10/2019 1003 by Leodis Liverpool, RN Outcome: Not Progressing   Problem: Education: Goal: Ability to make informed decisions regarding treatment will improve 02/10/2019 1003 by Leodis Liverpool, RN Outcome: Adequate for Discharge 02/10/2019 1003 by Leodis Liverpool, RN Outcome: Not Progressing   Problem: Coping: Goal: Coping ability will improve 02/10/2019 1003 by Leodis Liverpool, RN Outcome: Adequate for Discharge 02/10/2019 1003 by Leodis Liverpool, RN Outcome: Not Progressing

## 2019-02-10 NOTE — Progress Notes (Signed)
  [facility] Adult Case Management Discharge Plan :  Will you be returning to the same living situation after discharge:  Yes,  home At discharge, do you have transportation home?: Yes,  car is in parking lot Do you have the ability to pay for your medications: Yes,  mental health  Release of information consent forms completed and in the chart;    Patient to Follow up at: Follow-up Information    [facility] Follow up on 02/12/2019.   Why: You have a zoom meeting with Sherrian Divers on Friday, November 20th at 7am. Thank you. Contact information: [place] [address] [phone]           Next level of care provider has access to [place] and Suicide Prevention discussed: Yes,  SPE completed with pts sister  Have you used any form of tobacco in the last 30 days? (Cigarettes, Smokeless Tobacco, Cigars, and/or Pipes): No  Has patient been referred to the Quitline?: N/A patient is not a smoker  Patient has been referred for addiction treatment: Pt. refused referral  Delfin Edis, LCSW 02/10/2019, 8:48 AM

## 2019-02-10 NOTE — Progress Notes (Deleted)
Recreation Therapy Notes   Date: 02/09/2019  Time: 9:30 am  Location: Craft room  Behavioral response: Appropriate   Intervention Topic: Stress Management   Discussion/Intervention:   Group content on today was focused on stress. The group defined stress and way to cope with stress. Participants expressed how they know when they are stresses out. Individuals described the different ways they have to cope with stress. The group stated reasons why it is important to cope with stress. Patient explained what good stress is and some examples. The group participated in the intervention "Stress Management Jeopardy". Individuals were separated into two group and answered questions related to stress.  Clinical Observations/Feedback:  Patient came to group and was focused on what peers and staff had to say about managing stress. Individual participated in the intervention and was social with peers and staff during group. Karrisa Didio LRT/CTRS         Elani Delph 02/10/2019 1:18 PM

## 2019-02-10 NOTE — Progress Notes (Signed)
D: Patient is aware of  Discharge this shift .   A:Patient denies suicidal /homicidal ideations. Patient received all belongings brought in   No Storage medications. Writer reviewed Discharge Summary, Suicide Risk Assessment, and Transitional Record. Patient also received Prescriptions   from  MD. A 7 day supply of medications given to patient Aware  Of follow up appointment .  R: Patient left unit with no questions  Or concerns  With his automobile

## 2019-02-10 NOTE — Progress Notes (Signed)
Recreation Therapy Notes  INPATIENT RECREATION TR PLAN  Patient Details Name: BARNES FLOREK MRN: [id number] DOB: [birth year] Today's Date: 02/10/2019  Rec Therapy Plan Is patient appropriate for Therapeutic Recreation?: Yes Treatment times per week: at least 3 Estimated Length of Stay: 5-7 days TR Treatment/Interventions: Group participation (Comment)  Discharge Criteria Pt will be discharged from therapy if:: Discharged Treatment plan/goals/alternatives discussed and agreed upon by:: Patient/family  Discharge Summary Short term goals set: Patient will identify 3 triggers for depression within 5 recreation therapy group sessions Short term goals met: Adequate for discharge Progress toward goals comments: Groups attended Which groups?: Stress management, Goal setting, Self-esteem Reason goals not met: N/A Therapeutic equipment acquired: N/A Reason patient discharged from therapy: Discharge from hospital Pt/family agrees with progress & goals achieved: Yes Date patient discharged from therapy: 02/10/19   Brin Ruggerio 02/10/2019, 1:20 PM

## 2019-02-23 ENCOUNTER — Inpatient Hospital Stay (HOSPITAL_COMMUNITY): Payer: Medicaid Other

## 2019-02-23 ENCOUNTER — Emergency Department (HOSPITAL_COMMUNITY): Payer: Medicaid Other

## 2019-02-23 ENCOUNTER — Encounter (HOSPITAL_COMMUNITY): Payer: Self-pay

## 2019-02-23 ENCOUNTER — Inpatient Hospital Stay (HOSPITAL_COMMUNITY)
Admission: EM | Admit: 2019-02-23 | Discharge: 2019-02-25 | DRG: 065 | Disposition: A | Discharge status: Home Health | Payer: Medicaid Other | Source: Ambulatory Visit | Attending: Internal Medicine | Admitting: Internal Medicine

## 2019-02-23 ENCOUNTER — Other Ambulatory Visit: Payer: Self-pay

## 2019-02-23 DIAGNOSIS — E669 Obesity, unspecified: Secondary | ICD-10-CM | POA: Diagnosis present

## 2019-02-23 DIAGNOSIS — E785 Hyperlipidemia, unspecified: Secondary | ICD-10-CM | POA: Diagnosis present

## 2019-02-23 DIAGNOSIS — E1140 Type 2 diabetes mellitus with diabetic neuropathy, unspecified: Secondary | ICD-10-CM | POA: Diagnosis present

## 2019-02-23 DIAGNOSIS — N179 Acute kidney failure, unspecified: Secondary | ICD-10-CM | POA: Diagnosis present

## 2019-02-23 DIAGNOSIS — Z560 Unemployment, unspecified: Secondary | ICD-10-CM

## 2019-02-23 DIAGNOSIS — J302 Other seasonal allergic rhinitis: Secondary | ICD-10-CM | POA: Diagnosis present

## 2019-02-23 DIAGNOSIS — Z7982 Long term (current) use of aspirin: Secondary | ICD-10-CM

## 2019-02-23 DIAGNOSIS — Z79899 Other long term (current) drug therapy: Secondary | ICD-10-CM

## 2019-02-23 DIAGNOSIS — R4781 Slurred speech: Secondary | ICD-10-CM | POA: Diagnosis present

## 2019-02-23 DIAGNOSIS — Z955 Presence of coronary angioplasty implant and graft: Secondary | ICD-10-CM | POA: Diagnosis not present

## 2019-02-23 DIAGNOSIS — R45851 Suicidal ideations: Secondary | ICD-10-CM | POA: Diagnosis present

## 2019-02-23 DIAGNOSIS — E781 Pure hyperglyceridemia: Secondary | ICD-10-CM | POA: Diagnosis present

## 2019-02-23 DIAGNOSIS — Z823 Family history of stroke: Secondary | ICD-10-CM | POA: Diagnosis not present

## 2019-02-23 DIAGNOSIS — E1165 Type 2 diabetes mellitus with hyperglycemia: Secondary | ICD-10-CM | POA: Diagnosis present

## 2019-02-23 DIAGNOSIS — G8191 Hemiplegia, unspecified affecting right dominant side: Secondary | ICD-10-CM | POA: Diagnosis present

## 2019-02-23 DIAGNOSIS — R471 Dysarthria and anarthria: Secondary | ICD-10-CM | POA: Diagnosis present

## 2019-02-23 DIAGNOSIS — Z20828 Contact with and (suspected) exposure to other viral communicable diseases: Secondary | ICD-10-CM | POA: Diagnosis present

## 2019-02-23 DIAGNOSIS — I639 Cerebral infarction, unspecified: Secondary | ICD-10-CM | POA: Diagnosis present

## 2019-02-23 DIAGNOSIS — F339 Major depressive disorder, recurrent, unspecified: Secondary | ICD-10-CM | POA: Diagnosis present

## 2019-02-23 DIAGNOSIS — I10 Essential (primary) hypertension: Secondary | ICD-10-CM | POA: Diagnosis present

## 2019-02-23 DIAGNOSIS — E7800 Pure hypercholesterolemia, unspecified: Secondary | ICD-10-CM | POA: Diagnosis present

## 2019-02-23 DIAGNOSIS — Z8249 Family history of ischemic heart disease and other diseases of the circulatory system: Secondary | ICD-10-CM

## 2019-02-23 DIAGNOSIS — R29706 NIHSS score 6: Secondary | ICD-10-CM | POA: Diagnosis present

## 2019-02-23 DIAGNOSIS — F1020 Alcohol dependence, uncomplicated: Secondary | ICD-10-CM | POA: Diagnosis present

## 2019-02-23 DIAGNOSIS — I6381 Other cerebral infarction due to occlusion or stenosis of small artery: Principal | ICD-10-CM | POA: Diagnosis present

## 2019-02-23 DIAGNOSIS — Z6826 Body mass index (BMI) 26.0-26.9, adult: Secondary | ICD-10-CM

## 2019-02-23 DIAGNOSIS — I2510 Atherosclerotic heart disease of native coronary artery without angina pectoris: Secondary | ICD-10-CM | POA: Diagnosis present

## 2019-02-23 DIAGNOSIS — R29810 Facial weakness: Secondary | ICD-10-CM | POA: Diagnosis present

## 2019-02-23 DIAGNOSIS — I252 Old myocardial infarction: Secondary | ICD-10-CM

## 2019-02-23 DIAGNOSIS — Z87891 Personal history of nicotine dependence: Secondary | ICD-10-CM

## 2019-02-23 DIAGNOSIS — Z79891 Long term (current) use of opiate analgesic: Secondary | ICD-10-CM

## 2019-02-23 DIAGNOSIS — Z794 Long term (current) use of insulin: Secondary | ICD-10-CM

## 2019-02-23 LAB — CBC
HCT: 38.6 % — ABNORMAL LOW (ref 39.0–52.0)
Hemoglobin: 13.6 g/dL (ref 13.0–17.0)
MCH: 33 pg (ref 26.0–34.0)
MCHC: 35.2 g/dL (ref 30.0–36.0)
MCV: 93.7 fL (ref 80.0–100.0)
Platelets: 198 10*3/uL (ref 150–400)
RBC: 4.12 MIL/uL — ABNORMAL LOW (ref 4.22–5.81)
RDW: 12 % (ref 11.5–15.5)
WBC: 5.8 10*3/uL (ref 4.0–10.5)
nRBC: 0 % (ref 0.0–0.2)

## 2019-02-23 LAB — DIFFERENTIAL
Abs Immature Granulocytes: 0.04 10*3/uL (ref 0.00–0.07)
Basophils Absolute: 0.1 10*3/uL (ref 0.0–0.1)
Basophils Relative: 1 %
Eosinophils Absolute: 0.2 10*3/uL (ref 0.0–0.5)
Eosinophils Relative: 4 %
Immature Granulocytes: 1 %
Lymphocytes Relative: 26 %
Lymphs Abs: 1.5 10*3/uL (ref 0.7–4.0)
Monocytes Absolute: 0.3 10*3/uL (ref 0.1–1.0)
Monocytes Relative: 5 %
Neutro Abs: 3.6 10*3/uL (ref 1.7–7.7)
Neutrophils Relative %: 63 %

## 2019-02-23 LAB — LIPID PANEL
Cholesterol: 191 mg/dL (ref 0–200)
HDL: 44 mg/dL (ref >40)
LDL Cholesterol: 68 mg/dL (ref 0–99)
Total CHOL/HDL Ratio: 4.3 RATIO
Triglycerides: 393 mg/dL — ABNORMAL HIGH (ref <150)
VLDL: 79 mg/dL — ABNORMAL HIGH (ref 0–40)

## 2019-02-23 LAB — I-STAT CHEM 8, ED
BUN: 17 mg/dL (ref 6–20)
Calcium, Ion: 1.22 mmol/L (ref 1.15–1.40)
Chloride: 103 mmol/L (ref 98–111)
Creatinine, Ser: 1.2 mg/dL (ref 0.61–1.24)
Glucose, Bld: 240 mg/dL — ABNORMAL HIGH (ref 70–99)
HCT: 37 % — ABNORMAL LOW (ref 39.0–52.0)
Hemoglobin: 12.6 g/dL — ABNORMAL LOW (ref 13.0–17.0)
Potassium: 3.8 mmol/L (ref 3.5–5.1)
Sodium: 140 mmol/L (ref 135–145)
TCO2: 26 mmol/L (ref 22–32)

## 2019-02-23 LAB — COMPREHENSIVE METABOLIC PANEL
ALT: 28 U/L (ref 0–44)
AST: 28 U/L (ref 15–41)
Albumin: 4 g/dL (ref 3.5–5.0)
Alkaline Phosphatase: 95 U/L (ref 38–126)
Anion gap: 17 — ABNORMAL HIGH (ref 5–15)
BUN: 15 mg/dL (ref 6–20)
CO2: 22 mmol/L (ref 22–32)
Calcium: 9.4 mg/dL (ref 8.9–10.3)
Chloride: 102 mmol/L (ref 98–111)
Creatinine, Ser: 1.49 mg/dL — ABNORMAL HIGH (ref 0.61–1.24)
GFR calc Af Amer: >60 mL/min (ref >60)
GFR calc non Af Amer: 54 mL/min — ABNORMAL LOW (ref >60)
Glucose, Bld: 248 mg/dL — ABNORMAL HIGH (ref 70–99)
Potassium: 3.9 mmol/L (ref 3.5–5.1)
Sodium: 141 mmol/L (ref 135–145)
Total Bilirubin: 1 mg/dL (ref 0.3–1.2)
Total Protein: 6.8 g/dL (ref 6.5–8.1)

## 2019-02-23 LAB — CBG MONITORING, ED: Glucose-Capillary: 247 mg/dL — ABNORMAL HIGH (ref 70–99)

## 2019-02-23 LAB — HEMOGLOBIN A1C
Hgb A1c MFr Bld: 8.7 % — ABNORMAL HIGH (ref 4.8–5.6)
Mean Plasma Glucose: 202.99 mg/dL

## 2019-02-23 LAB — GLUCOSE, CAPILLARY
Glucose-Capillary: 161 mg/dL — ABNORMAL HIGH (ref 70–99)
Glucose-Capillary: 204 mg/dL — ABNORMAL HIGH (ref 70–99)

## 2019-02-23 LAB — HIV ANTIBODY (ROUTINE TESTING W REFLEX): HIV Screen 4th Generation wRfx: NONREACTIVE

## 2019-02-23 LAB — SARS CORONAVIRUS 2 (TAT 6-24 HRS): SARS Coronavirus 2: NEGATIVE

## 2019-02-23 LAB — APTT: aPTT: 28 seconds (ref 24–36)

## 2019-02-23 LAB — PROTIME-INR
INR: 1 (ref 0.8–1.2)
Prothrombin Time: 13 seconds (ref 11.4–15.2)

## 2019-02-23 MED ORDER — IOHEXOL 350 MG/ML SOLN
75.0000 mL | Freq: Once | INTRAVENOUS | Status: AC | PRN
  Administered 2019-02-23: 75 mL via INTRAVENOUS

## 2019-02-23 MED ORDER — ASPIRIN EC 325 MG PO TBEC
325.0000 mg | DELAYED_RELEASE_TABLET | Freq: Every day | ORAL | Status: DC
  Administered 2019-02-23: 325 mg via ORAL
  Filled 2019-02-23: qty 1

## 2019-02-23 MED ORDER — INSULIN ASPART 100 UNIT/ML ~~LOC~~ SOLN
0.0000 [IU] | Freq: Three times a day (TID) | SUBCUTANEOUS | Status: DC
  Administered 2019-02-23 – 2019-02-24 (×3): 3 [IU] via SUBCUTANEOUS
  Administered 2019-02-24: 13:00:00 5 [IU] via SUBCUTANEOUS
  Administered 2019-02-25 (×2): 3 [IU] via SUBCUTANEOUS

## 2019-02-23 MED ORDER — ACETAMINOPHEN 325 MG PO TABS: 650.0000 mg | ORAL_TABLET | ORAL | Status: DC | PRN

## 2019-02-23 MED ORDER — CARVEDILOL 12.5 MG PO TABS
25.0000 mg | ORAL_TABLET | Freq: Two times a day (BID) | ORAL | Status: DC
  Administered 2019-02-23 – 2019-02-25 (×4): 25 mg via ORAL
  Filled 2019-02-23 (×4): qty 2

## 2019-02-23 MED ORDER — SODIUM CHLORIDE 0.9% FLUSH
3.0000 mL | Freq: Once | INTRAVENOUS | Status: AC
Start: 2019-02-23 — End: 2019-02-23
  Administered 2019-02-23: 3 mL via INTRAVENOUS

## 2019-02-23 MED ORDER — SENNOSIDES-DOCUSATE SODIUM 8.6-50 MG PO TABS: 1.0000 | ORAL_TABLET | Freq: Every evening | ORAL | Status: DC | PRN

## 2019-02-23 MED ORDER — STROKE: EARLY STAGES OF RECOVERY BOOK
Freq: Once | Status: AC
  Administered 2019-02-23: 20:00:00
  Filled 2019-02-23: qty 1

## 2019-02-23 MED ORDER — LACTATED RINGERS IV BOLUS
500.0000 mL | Freq: Once | INTRAVENOUS | Status: AC
  Administered 2019-02-23: 500 mL via INTRAVENOUS

## 2019-02-23 MED ORDER — ACETAMINOPHEN 650 MG RE SUPP: 650.0000 mg | RECTAL | Status: DC | PRN

## 2019-02-23 MED ORDER — ATORVASTATIN CALCIUM 80 MG PO TABS
80.0000 mg | ORAL_TABLET | Freq: Every day | ORAL | Status: DC
  Administered 2019-02-23: 80 mg via ORAL
  Filled 2019-02-23: qty 1

## 2019-02-23 MED ORDER — MIRTAZAPINE 15 MG PO TABS
30.0000 mg | ORAL_TABLET | Freq: Every day | ORAL | Status: DC
  Administered 2019-02-23 – 2019-02-24 (×2): 30 mg via ORAL
  Filled 2019-02-23 (×2): qty 2
  Filled 2019-02-23: qty 1

## 2019-02-23 MED ORDER — ACETAMINOPHEN 160 MG/5ML PO SOLN: 650.0000 mg | ORAL | Status: DC | PRN

## 2019-02-23 MED ORDER — GABAPENTIN 100 MG PO CAPS
200.0000 mg | ORAL_CAPSULE | Freq: Three times a day (TID) | ORAL | Status: DC
  Administered 2019-02-23 – 2019-02-25 (×6): 200 mg via ORAL
  Filled 2019-02-23 (×6): qty 2

## 2019-02-23 MED ORDER — INSULIN ASPART 100 UNIT/ML ~~LOC~~ SOLN
0.0000 [IU] | Freq: Every day | SUBCUTANEOUS | Status: DC
  Administered 2019-02-23: 2 [IU] via SUBCUTANEOUS

## 2019-02-23 MED ORDER — ENOXAPARIN SODIUM 40 MG/0.4ML ~~LOC~~ SOLN
40.0000 mg | SUBCUTANEOUS | Status: DC
  Administered 2019-02-23 – 2019-02-24 (×2): 40 mg via SUBCUTANEOUS
  Filled 2019-02-23 (×2): qty 0.4

## 2019-02-23 NOTE — H&P (Signed)
Date: 02/23/2019               Patient Name:  Christian [name] MRN: [id number]  DOB: 1970/08/28 Age / Sex: 48 y.o., male   PCP: Patient, No Pcp Per         Medical Service: Internal Medicine Teaching Service         Attending Physician: Dr. Lynnae January, Real Cons, MD    First Contact: Dr. Madilyn Fireman Pager: [phone]  Second Contact: Dr. Truman Hayward Pager: [phone]       After Hours (After 5p/  First Contact Pager: [phone]  weekends / holidays): Second Contact Pager: [phone]   Chief Complaint: right sided weakness  History of Present Illness: Christian [name] is a 48 year old male w/ a PMHx of HTN, hyperlipidemia, DM, obesity, CAD, alcohol use disorder and severe depression with suicidal ideation presenting with progressive right-sided weakness for the last four days which has begun interfering with his ADLs.  His symptoms began with right leg weakness and progressed to include weakness in his right upper extremity.  He reports bumping into things and a tingling sensation in the right side.  He reports that the symptoms became acutely worse today while trying to take a shower.  He notes that he developed slurred speech today in the shower as well.  It was after the shower that he decided to go to the doctor.  He says he had not taken his medicines yet today. He denies fever, chills, sore throat, chest pain, shortness of breath, nausea, vomiting, skin changes, joint aches and pains, headaches, mood changes.  He does report some cough and rhinorrhea secondary to allergies.  He also reports some diarrhea all day yesterday which is very unusual for him.  He lives with his mom and she is not having diarrhea at this time.  He has no known exposure to Covid.  Meds:  No outpatient medications have been marked as taking for the 02/23/19 encounter [facility] Encounter).     Allergies: Allergies as of 02/23/2019  . (No Known Allergies)   Past Medical History:  Diagnosis Date  . CAD (coronary artery disease)    a.  07/2013 NSTEMI/PCI: LCX 83m (4.0x23 Xience DES), RCA 65m, EF > 55%;  b. 07/2013 Echo: EF 60-65%, diast dysfxn, mild LVH, mildly dil LA, nl RV size/fxn, nl RVSP.  Marland Kitchen History of tobacco abuse    a. quit ~ 2010  . Hyperlipidemia   . Morbid obesity ([place])    a. weighed 168 @ age 31.  . 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   Physical Exam: Blood pressure (!) 138/94, pulse 80, temperature 98.9 F (37.2 C), temperature source Oral, resp. rate 18, height 5\' 6"  (1.676 m), weight 74.7 kg, SpO2 95 %.  Physical Exam  Constitutional: No distress.  Cardiovascular: Normal rate, regular rhythm, normal heart sounds and intact distal pulses.  No murmur heard. Pulmonary/Chest: Effort normal and breath sounds normal. No respiratory distress. He exhibits no tenderness.  Abdominal: Soft. Bowel sounds are normal. He exhibits  no distension. There is no abdominal tenderness.  Musculoskeletal: Normal range of motion.  Neurological: He is alert. A cranial nerve deficit (right facial droop, otherwise no CN deficits) is present.  3 out of 5 strength in the right lower extremity, full strength in the left lower extremity; 4 out of 5 strength in right upper extremity, full strength in left upper extremity; right facial droop  Skin: Skin is warm and dry. He is not diaphoretic. No erythema.  Nursing note and vitals reviewed.  Labs:  Results for orders placed or performed during the hospital encounter of 02/23/19 (from the past 24 hour(s))  Protime-INR     Status: None   Collection Time: 02/23/19 11:27 AM  Result Value Ref  Range   Prothrombin Time 13.0 11.4 - 15.2 seconds   INR 1.0 0.8 - 1.2  APTT     Status: None   Collection Time: 02/23/19 11:27 AM  Result Value Ref Range   aPTT 28 24 - 36 seconds  CBC     Status: Abnormal   Collection Time: 02/23/19 11:27 AM  Result Value Ref Range   WBC 5.8 4.0 - 10.5 K/uL   RBC 4.12 (L) 4.22 - 5.81 MIL/uL   Hemoglobin 13.6 13.0 - 17.0 g/dL   HCT 38.6 (L) 39.0 - 52.0 %   MCV 93.7 80.0 - 100.0 fL   MCH 33.0 26.0 - 34.0 pg   MCHC 35.2 30.0 - 36.0 g/dL   RDW 12.0 11.5 - 15.5 %   Platelets 198 150 - 400 K/uL   nRBC 0.0 0.0 - 0.2 %  Differential     Status: None   Collection Time: 02/23/19 11:27 AM  Result Value Ref Range   Neutrophils Relative % 63 %   Neutro Abs 3.6 1.7 - 7.7 K/uL   Lymphocytes Relative 26 %   Lymphs Abs 1.5 0.7 - 4.0 K/uL   Monocytes Relative 5 %   Monocytes Absolute 0.3 0.1 - 1.0 K/uL   Eosinophils Relative 4 %   Eosinophils Absolute 0.2 0.0 - 0.5 K/uL   Basophils Relative 1 %   Basophils Absolute 0.1 0.0 - 0.1 K/uL   Immature Granulocytes 1 %   Abs Immature Granulocytes 0.04 0.00 - 0.07 K/uL  Comprehensive metabolic panel     Status: Abnormal   Collection Time: 02/23/19 11:27 AM  Result Value Ref Range   Sodium 141 135 - 145 mmol/L   Potassium 3.9 3.5 - 5.1 mmol/L   Chloride 102 98 - 111 mmol/L   CO2 22 22 - 32 mmol/L   Glucose, Bld 248 (H) 70 - 99 mg/dL   BUN 15 6 - 20 mg/dL   Creatinine, Ser 1.49 (H) 0.61 - 1.24 mg/dL   Calcium 9.4 8.9 - 10.3 mg/dL   Total Protein 6.8 6.5 - 8.1 g/dL   Albumin 4.0 3.5 - 5.0 g/dL   AST 28 15 - 41 U/L   ALT 28 0 - 44 U/L   Alkaline Phosphatase 95 38 - 126 U/L   Total Bilirubin 1.0 0.3 - 1.2 mg/dL   GFR calc non Af Amer 54 (L) >60 mL/min   GFR calc Af Amer >60 >60 mL/min   Anion gap 17 (H) 5 - 15  CBG monitoring, ED     Status: Abnormal   Collection Time: 02/23/19 11:29 AM  Result Value Ref Range   Glucose-Capillary 247 (H) 70 - 99 mg/dL  I-stat chem 8, ED     Status: Abnormal   Collection  Time: 02/23/19 11:57 AM  Result Value Ref Range   Sodium 140 135 - 145 mmol/L   Potassium 3.8 3.5 - 5.1 mmol/L   Chloride 103 98 - 111 mmol/L   BUN 17 6 - 20 mg/dL   Creatinine, Ser 1.20 0.61 - 1.24 mg/dL   Glucose, Bld 240 (H) 70 - 99 mg/dL   Calcium, Ion 1.22 1.15 - 1.40 mmol/L   TCO2 26 22 - 32 mmol/L   Hemoglobin 12.6 (L) 13.0 - 17.0 g/dL   HCT 37.0 (L) 39.0 - 52.0 %  Hemoglobin A1c     Status: Abnormal   Collection Time: 02/23/19  1:50 PM  Result Value Ref Range   Hgb A1c MFr Bld 8.7 (H) 4.8 - 5.6 %   Mean Plasma Glucose 202.99 mg/dL   EKG: personally reviewed my interpretation is sinus rhythm without acute ischemic changes  CT Head: IMPRESSION: 1. No acute intracranial hemorrhage or definite acute infarction. ASPECTS is 10. 2. New age-indeterminate small vessel infarcts of bilateral basal ganglia and adjacent white matter as well as the right thalamus. 3. Progression of mild chronic microvascular ischemic changes and minor parenchymal volume loss since 2015.  CT Angio  Head and Neck: IMPRESSION: CTA neck:  The common carotid, internal carotid and vertebral arteries are patent within the neck without significant stenosis. No significant atherosclerotic disease.  CTA head:  No intracranial large vessel occlusion or proximal high-grade arterial stenosis.  Assessment:  Christian [name] is a 48 year old male w/ a PMHx of HTN, hyperlipidemia, DM, obesity, CAD, alcohol use disorder and severe depression with suicidal ideation presenting with progressive right-sided weakness and facial droop concerning for an acute/subacute stroke.  Plan by Problem:  Active Problems:   Acute CVA (cerebrovascular accident) ([place]) -presented w/ 4 day hx right sided weakness and facial droop concerning for stroke without acute hemorrhage or infarct on CT w/ new age-indeterminate small vessel infarcts of the bilateral basal ganglia and adjacent white matter and right thalamus.  -pt out of  window for TPA or permissive htn -no significant stenoses of the head and neck on CTA -undergoing regular stroke work up with MRI, echo, HIV, HgbA1c, Lipid panel pending  Plan: -medical optimization with aspirin 325 daily, atorvastatin 80 daily, tight glucose control -evaluation by speech/PT/OT -fu MRI, echo, HIV, HgbA1c and lipid panel  AKI: -baseline creatinine of 0.8 with increase up to 1.5 likely in setting of poor po during subacute stroke -may also be slightly dehydrated after multiple episodes of diarrhea yesterday  Plan: -with treat with fluids and follow with BMP  HTN: -out of window for permissive hypertension -BP 131/96 most recently; didn't take medicines this morning  -continue carvedilol -hold losartan given AKI  Hyperlipidemia: -previously on atorvastatin 10 mg -increase dose to 80 daily -lipid panel pending  DM: -on glipizide 10 mg BID with meals, metofrmin 1000 mg BID with meals and lantus 10 units before bed at home -didn't take his diabetes medications today -SSI here  Severe depression w/ suicidal ideation: -pt denies mood changes -continue mirtazapine 30 mg nightly, gabapentin 200 mg TID  -holding trazodone   Dispo: Admit patient to Inpatient with expected length of stay greater than 2 midnights.  Signed: Al Decant, MD 02/23/2019, 2:24 PM  Pager: [phone]

## 2019-02-23 NOTE — ED Provider Notes (Signed)
[facility] EMERGENCY DEPARTMENT Provider Note   CSN: [id number] Arrival date & time: 02/23/19  1125  An emergency department physician performed an initial assessment on this suspected stroke patient at 1130.  History   Chief Complaint Chief Complaint  Patient presents with   Code Stroke    HPI Christian [name] is a 48 y.o. male.     HPI Patient came in as a code stroke.  Reportedly facial droop and right arm and leg weakness.  Called last known well by EMS at 41 but has been having some weakness since Friday.  No headache.  Some difficulty speaking.  Not on anticoagulation.  Met upon arrival at the bridge by myself and Dr. Malen Gauze from neurology. Past Medical History:  Diagnosis Date   CAD (coronary artery disease)    a. 07/2013 NSTEMI/PCI: LCX 48m(4.0x23 Xience DES), RCA 470mEF > 55%;  b. 07/2013 Echo: EF 60-65%, diast dysfxn, mild LVH, mildly dil LA, nl RV size/fxn, nl RVSP.   History of tobacco abuse    a. quit ~ 2010   Hyperlipidemia    Morbid obesity ([facility]CRufus   a. weighed 168 @ age 89  Scoliosis     Patient Active Problem List   Diagnosis Date Noted   Major depression 02/05/2019   Severe episode of recurrent major depressive disorder, without psychotic features (HCFrancisco03/08/2018   Suicidal ideation 05/29/2018   Alcohol use disorder, moderate, dependence (HCApison03/08/2018   Hyperosmolar non-ketotic state in patient with type 2 diabetes mellitus (HCWilliams02/14/2020   Chest pain 12/30/2017   CAD (coronary artery disease)    HTN (hypertension)    Hyperlipidemia    Diabetes mellitus, type II (HCLapel   Morbid obesity ([facility]Broad Creek   History of tobacco abuse     Past Surgical History:  Procedure Laterality Date   ADENOIDECTOMY     CARDIAC CATHETERIZATION  07/2013   [facility]'x1 stent        Home Medications    Prior to Admission medications   Medication Sig Start Date End Date Taking? Authorizing Provider  aspirin 81 MG chewable tablet  Chew 1 tablet (81 mg total) by mouth daily. 02/10/19   Money, TrLowry RamFNP  atorvastatin (LIPITOR) 10 MG tablet Take 1 tablet (10 mg total) by mouth daily at 6 PM. 02/10/19   Money, TrLowry RamFNP  carvedilol (COREG) 25 MG tablet Take 1 tablet (25 mg total) by mouth 2 (two) times daily with a meal. 02/10/19   Money, TrLowry RamFNP  gabapentin (NEURONTIN) 100 MG capsule Take 2 capsules (200 mg total) by mouth 3 (three) times daily. 02/10/19   Money, TrLowry RamFNP  glipiZIDE (GLUCOTROL) 10 MG tablet Take 1 tablet (10 mg total) by mouth 2 (two) times daily before a meal. 02/10/19   Money, TrLowry RamFNP  insulin glargine (LANTUS) 100 UNIT/ML injection Inject 0.1 mLs (10 Units total) into the skin at bedtime. 02/10/19   Money, TrLowry RamFNP  losartan (COZAAR) 100 MG tablet Take 1 tablet (100 mg total) by mouth daily. 02/10/19   Money, TrLowry RamFNP  metFORMIN (GLUCOPHAGE) 1000 MG tablet Take 1 tablet (1,000 mg total) by mouth 2 (two) times daily with a meal. 02/10/19   Money, TrLowry RamFNP  mirtazapine (REMERON) 30 MG tablet Take 1 tablet (30 mg total) by mouth at bedtime. 02/10/19   Money, TrLowry RamFNP  traZODone (DESYREL) 50 MG tablet Take 1 tablet (50 mg total)  by mouth at bedtime as needed for sleep. 02/10/19   Money, Lowry Ram, FNP    Family History Family History  Problem Relation Age of Onset   Heart disease Mother    Esophageal cancer Father     Social History Social History   Tobacco Use   Smoking status: Former Smoker    Packs/day: 0.50    Years: 20.00    Pack years: 10.00    Types: Cigarettes   Smokeless tobacco: Never Used  Substance Use Topics   Alcohol use: Yes   Drug use: No    Types: Marijuana    Comment: past     Allergies   Patient has no known allergies.   Review of Systems Review of Systems  Constitutional: Negative for appetite change.  HENT: Negative for congestion.   Respiratory: Negative for shortness of breath.   Cardiovascular: Negative for  chest pain.  Gastrointestinal: Negative for abdominal pain.  Genitourinary: Negative for flank pain.  Musculoskeletal: Negative for back pain.  Skin: Negative for rash.  Neurological: Positive for weakness. Negative for numbness and headaches.     Physical Exam Updated Vital Signs BP (!) 160/95    Pulse 86    Temp 98.9 F (37.2 C) (Oral)    Resp 18    Ht '5\' 6"'$  (1.676 m)    Wt 74.7 kg    SpO2 99%    BMI 26.58 kg/m   Physical Exam Vitals signs and nursing note reviewed.  HENT:     Head: Atraumatic.  Eyes:     Extraocular Movements: Extraocular movements intact.  Neck:     Musculoskeletal: Neck supple.  Cardiovascular:     Rate and Rhythm: Regular rhythm.  Pulmonary:     Breath sounds: No wheezing, rhonchi or rales.  Abdominal:     Tenderness: There is no abdominal tenderness.  Musculoskeletal:     Right lower leg: No edema.     Left lower leg: No edema.  Skin:    General: Skin is warm.     Capillary Refill: Capillary refill takes less than 2 seconds.  Neurological:     Mental Status: He is alert and oriented to person, place, and time.     Comments: Mildly slurred speech.  Right-sided facial droop.  Strength decreased on right side on arm and leg compared to left side.  Sensation grossly intact.  Complete NIH scoring done by neurology.      ED Treatments / Results  Labs (all labs ordered are listed, but only abnormal results are displayed) Labs Reviewed  CBC - Abnormal; Notable for the following components:      Result Value   RBC 4.12 (*)    HCT 38.6 (*)    All other components within normal limits  I-STAT CHEM 8, ED - Abnormal; Notable for the following components:   Glucose, Bld 240 (*)    Hemoglobin 12.6 (*)    HCT 37.0 (*)    All other components within normal limits  CBG MONITORING, ED - Abnormal; Notable for the following components:   Glucose-Capillary 247 (*)    All other components within normal limits  SARS CORONAVIRUS 2 (TAT 6-24 HRS)  PROTIME-INR   APTT  DIFFERENTIAL  COMPREHENSIVE METABOLIC PANEL    EKG EKG Interpretation  Date/Time:  Tuesday February 23 2019 11:56:32 EST Ventricular Rate:  82 PR Interval:    QRS Duration: 93 QT Interval:  401 QTC Calculation: 469 R Axis:   78 Text Interpretation: Sinus rhythm  Inferior infarct, old Confirmed by Davonna Belling [phone]) on 02/23/2019 12:32:46 PM   Radiology Ct Head Code Stroke Wo Contrast  Result Date: 02/23/2019 CLINICAL DATA:  Code stroke.  Right-sided weakness EXAM: CT HEAD WITHOUT CONTRAST TECHNIQUE: Contiguous axial images were obtained from the base of the skull through the vertex without intravenous contrast. COMPARISON:  2015. FINDINGS: Brain: There is no acute intracranial hemorrhage or mass effect. Gray-white differentiation is preserved. Patchy hypoattenuation in the supratentorial white matter is nonspecific but may reflect mild chronic microvascular ischemic changes. There are new age-indeterminate small vessel infarcts of bilateral basal ganglia and adjacent white matter as well as the right thalamus. Prominence of the ventricles and sulci reflects minor parenchymal volume loss. Vascular: No hyperdense vessel. Skull: Unremarkable. Sinuses/Orbits: Trace mucosal thickening.  Orbits are unremarkable. Other: Mastoid air cells are clear. ASPECTS [facility] Stroke Program Early CT Score) - Ganglionic level infarction (caudate, lentiform nuclei, internal capsule, insula, M1-M3 cortex): 7 - Supraganglionic infarction (M4-M6 cortex): 3 Total score (0-10 with 10 being normal): 10 IMPRESSION: 1. No acute intracranial hemorrhage or definite acute infarction. ASPECTS is 10. 2. New age-indeterminate small vessel infarcts of bilateral basal ganglia and adjacent white matter as well as the right thalamus. 3. Progression of mild chronic microvascular ischemic changes and minor parenchymal volume loss since 2015. These results were communicated to Dr. Rory Percy at 11:44 amon 12/1/2020by text page  via the [facility] messaging system. Electronically Signed   By: Macy Mis M.D.   On: 02/23/2019 11:49    Procedures Procedures (including critical care time)  Medications Ordered in ED Medications  sodium chloride flush (NS) 0.9 % injection 3 mL (3 mLs Intravenous Given 02/23/19 1205)  iohexol (OMNIPAQUE) 350 MG/ML injection 75 mL (75 mLs Intravenous Contrast Given 02/23/19 1209)     Initial Impression / Assessment and Plan / ED Course  I have reviewed the triage vital signs and the nursing notes.  Pertinent labs & imaging results that were available during my care of the patient were reviewed by me and considered in my medical decision making (see chart for details).        Patient came in a code stroke.  Initially thought last normal was 930, however did have some weakness starting on Friday.  Not a TPA candidate because of this.  Initial head CT reassuring.  Code stroke canceled by neurology.  Will admit to unassigned medicine.  Patient states he does not have a PCP.  Final Clinical Impressions(s) / ED Diagnoses   Final diagnoses:  Cerebrovascular accident (CVA), unspecified mechanism [facility])    ED Discharge Orders    None       Davonna Belling, MD 02/23/19 1232

## 2019-02-23 NOTE — Code Documentation (Signed)
48 yo male coming from MD office after he arrived for an appt. Pt called the office and reported he was "feeling funny." Drove himself to the office, and the staff noted that the patient had right sided facial droop, right sided weakness. Staff called EMS and EMS activated a Code Stroke. Stroke Team met patient upon arrival to the ED. NIHSS 6 due to right arm drift, leg weakness, and right facial droop. When speaking to the patient, pt reported that he had last been well on Friday. Pt outside the window for tPA. CT/CTA completed. No acute treatment needed at this time. Handoff given to [employer], Therapist, sports.

## 2019-02-23 NOTE — Consult Note (Signed)
Neurology Consultation  Reason for Consult: Right-sided weakness, slurred speech Referring Physician: Dr. Davonna Belling  CC:  right-sided weakness slurred speech  History is obtained from: Patient, chart, EMS  HPI: Christian [name] is a 48 y.o. male past medical history significant for hyperlipidemia hypertension obesity coronary artery disease and major depression, brought into the hospital for sudden onset of right-sided weakness. Code stroke was activated because he was at a doctor's office and EMS was told that the last known normal was somewhere this morning but the patient confirmed that he has been having difficulty walking with right leg weakness at least since Friday, February 19, 2019.  He was not feeling well and that is why he went to his doctor's appointment but it was noted that he had right-sided weakness and right facial droop along with slurred speech. [facility] was called and he was brought into the hospital for further evaluation. He denies any history of prior strokes. Denies any headaches at this time.  Denies any visual symptoms.  Reports that he has been noticing difficulty with keeping liquids in his mouth and drools from the right corner of his mouth. Denies chest pain shortness of breath fevers chills.   LKW: Sometime on 02/19/2019 tpa given?: no, outside the window Premorbid modified Rankin scale (mRS): 0  ROS:  ROS was performed and is negative except as noted in the HPI.  Past Medical History:  Diagnosis Date  . CAD (coronary artery disease)    a. 07/2013 NSTEMI/PCI: LCX 4m (4.0x23 Xience DES), RCA 84m, EF > 55%;  b. 07/2013 Echo: EF 60-65%, diast dysfxn, mild LVH, mildly dil LA, nl RV size/fxn, nl RVSP.  Marland Kitchen History of tobacco abuse    a. quit ~ 2010  . Hyperlipidemia   . Morbid obesity ([place])    a. weighed 168 @ age 20.  . Scoliosis     Family History  Problem Relation Age of Onset  . Heart disease Mother   . Esophageal cancer Father     Social  History:   reports that he has quit smoking. His smoking use included cigarettes. He has a 10.00 pack-year smoking history. He has never used smokeless tobacco. He reports current alcohol use. He reports that he does not use drugs.  Medications  Current Facility-Administered Medications:  .  sodium chloride flush (NS) 0.9 % injection 3 mL, 3 mL, Intravenous, Once, Davonna Belling, MD  Current Outpatient Medications:  .  aspirin 81 MG chewable tablet, Chew 1 tablet (81 mg total) by mouth daily., Disp: 30 tablet, Rfl: 1 .  atorvastatin (LIPITOR) 10 MG tablet, Take 1 tablet (10 mg total) by mouth daily at 6 PM., Disp: 30 tablet, Rfl: 1 .  carvedilol (COREG) 25 MG tablet, Take 1 tablet (25 mg total) by mouth 2 (two) times daily with a meal., Disp: 60 tablet, Rfl: 1 .  gabapentin (NEURONTIN) 100 MG capsule, Take 2 capsules (200 mg total) by mouth 3 (three) times daily., Disp: 180 capsule, Rfl: 1 .  glipiZIDE (GLUCOTROL) 10 MG tablet, Take 1 tablet (10 mg total) by mouth 2 (two) times daily before a meal., Disp: 60 tablet, Rfl: 1 .  insulin glargine (LANTUS) 100 UNIT/ML injection, Inject 0.1 mLs (10 Units total) into the skin at bedtime., Disp: 10 mL, Rfl: 1 .  losartan (COZAAR) 100 MG tablet, Take 1 tablet (100 mg total) by mouth daily., Disp: 30 tablet, Rfl: 1 .  metFORMIN (GLUCOPHAGE) 1000 MG tablet, Take 1 tablet (1,000 mg total)  by mouth 2 (two) times daily with a meal., Disp: 60 tablet, Rfl: 1 .  mirtazapine (REMERON) 30 MG tablet, Take 1 tablet (30 mg total) by mouth at bedtime., Disp: 30 tablet, Rfl: 1 .  traZODone (DESYREL) 50 MG tablet, Take 1 tablet (50 mg total) by mouth at bedtime as needed for sleep., Disp: 30 tablet, Rfl: 1  Exam: Current vital signs: BP (!) 160/95   Pulse 86   Temp 98.9 F (37.2 C) (Oral)   Resp 18   SpO2 99%  Vital signs in last 24 hours: Temp:  [98.9 F (37.2 C)] 98.9 F (37.2 C) (12/01 1203) Pulse Rate:  [86] 86 (12/01 1203) Resp:  [18] 18 (12/01  1203) BP: (160)/(95) 160/95 (12/01 1203) SpO2:  [99 %] 99 % (12/01 1203) GENERAL: Awake, alert in NAD HEENT: - Normocephalic and atraumatic, dry mm, no LN++, no Thyromegally LUNGS - Clear to auscultation bilaterally with no wheezes CV - S1S2 RRR, no m/r/g, equal pulses bilaterally. ABDOMEN - Soft, nontender, nondistended with normoactive BS Ext: warm, well perfused, intact peripheral pulses  NEURO:  Mental Status: AA&Ox3 Language: speech is dysarthric.  Naming, repetition, fluency, and comprehension intact. Cranial Nerves: PERRL. EOMI, visual fields full, right lower facial weakness, facial sensation intact, hearing intact, tongue/uvula/soft palate midline, normal sternocleidomastoid and trapezius muscle strength. No evidence of tongue atrophy or fibrillations Motor: Right upper extremity 4/5, right lower extremity 3/5, left side normal strength. Tone: is normal and bulk is normal Sensation-diminished sensation on the right side to light touch, on face and body. Coordination: FTN intact bilaterally Gait- deferred  NIHSS 1a Level of Conscious.: 0 1b LOC Questions: 0 1c LOC Commands: 0 2 Best Gaze: 0 3 Visual: 0 4 Facial Palsy: 2 5a Motor Arm - left: 0 5b Motor Arm - Right: 1 6a Motor Leg - Left: 0 6b Motor Leg - Right: 1 7 Limb Ataxia: 0 8 Sensory: 1 9 Best Language: 0 10 Dysarthria: 1 11 Extinct. and Inatten.: 0 TOTAL: 6    Labs I have reviewed labs in epic and the results pertinent to this consultation are:  CBC    Component Value Date/Time   WBC 5.8 02/23/2019 1127   RBC 4.12 (L) 02/23/2019 1127   HGB 12.6 (L) 02/23/2019 1157   HGB 15.4 08/10/2013 0736   HCT 37.0 (L) 02/23/2019 1157   HCT 44.6 08/10/2013 0736   PLT 198 02/23/2019 1127   PLT 260 08/10/2013 0736   MCV 93.7 02/23/2019 1127   MCV 88 08/10/2013 0736   MCH 33.0 02/23/2019 1127   MCHC 35.2 02/23/2019 1127   RDW 12.0 02/23/2019 1127   RDW 12.8 08/10/2013 0736   LYMPHSABS 1.5 02/23/2019 1127    LYMPHSABS 6.4 (H) 07/12/2011 0040   MONOABS 0.3 02/23/2019 1127   MONOABS 0.6 07/12/2011 0040   EOSABS 0.2 02/23/2019 1127   EOSABS 0.4 07/12/2011 0040   BASOSABS 0.1 02/23/2019 1127   BASOSABS 0.1 07/12/2011 0040    CMP     Component Value Date/Time   NA 140 02/23/2019 1157   NA 136 08/12/2013 0405   K 3.8 02/23/2019 1157   K 3.8 08/12/2013 0405   CL 103 02/23/2019 1157   CL 104 08/12/2013 0405   CO2 29 02/03/2019 1344   CO2 26 08/12/2013 0405   GLUCOSE 240 (H) 02/23/2019 1157   GLUCOSE 139 (H) 08/12/2013 0405   BUN 17 02/23/2019 1157   BUN 9 08/12/2013 0405   CREATININE 1.20 02/23/2019 1157  CREATININE 0.95 08/12/2013 0405   CALCIUM 9.4 02/03/2019 1344   CALCIUM 8.8 08/12/2013 0405   PROT 7.0 02/05/2019 0720   PROT 7.0 12/15/2013 1523   PROT 7.8 08/10/2013 0736   ALBUMIN 4.1 02/05/2019 0720   ALBUMIN 4.7 12/15/2013 1523   ALBUMIN 4.2 08/10/2013 0736   AST 18 02/05/2019 0720   AST 37 08/10/2013 0736   ALT 16 02/05/2019 0720   ALT 36 08/10/2013 0736   ALKPHOS 60 02/05/2019 0720   ALKPHOS 83 08/10/2013 0736   BILITOT 1.1 02/05/2019 0720   BILITOT 1.2 (H) 08/10/2013 0736   GFRNONAA >60 02/03/2019 1344   GFRNONAA >60 08/12/2013 0405   GFRAA >60 02/03/2019 1344   GFRAA >60 08/12/2013 0405     Imaging I have reviewed the images obtained:  CT-scan of the brain-possible evolving left basal ganglia infarct.  Old lacunar's. CTA head and neck formal read pending.  On my review, no emergent LVO.  Assessment:  48 year old man with above past medical history brought in for evaluation of right-sided weakness slurred speech and right facial droop, which was noticed first at a doctor's office where he had gone with these complaints and brought in as a code stroke presumably with last known normal somewhere around 830 this morning whereas his symptoms have been according to him ongoing since Friday. Outside the window for TPA.  Exam not consistent with an LVO hence not a  candidate for intervention.  Likely late acute to early subacute stroke -  small vessel etiology.  Recommendations: Cancel code stroke as he is outside the window for any intervention with symptoms started on Friday, 02/19/2019. Admit to telemetry Can start normalizing blood pressures as he is also outside the window for permissive hypertension. Frequent neurochecks Aspirin 325 Atorvastatin 80 Lipid panel A1c 2D echo CTA head and neck done-results pending.  Preliminary review negative for LVO. MR brain without contrast PT OT speech therapy Stroke team will follow  -- Amie Portland, MD [facility] Pager: [phone] If 7pm to 7am, please call on call as listed on [facility].  CRITICAL CARE ATTESTATION Performed by: Amie Portland, MD Total critical care time: 40 minutes Critical care time was exclusive of separately billable procedures and treating other patients and/or supervising APPs/Residents/Students Critical care was necessary to treat or prevent imminent or life-threatening deterioration due to acute ischemic stroke  This patient is critically ill and at significant risk for neurological worsening and/or death and care requires constant monitoring. Critical care was time spent personally by me on the following activities: development of treatment plan with patient and/or surrogate as well as nursing, discussions with consultants, evaluation of patient's response to treatment, examination of patient, obtaining history from patient or surrogate, ordering and performing treatments and interventions, ordering and review of laboratory studies, ordering and review of radiographic studies, pulse oximetry, re-evaluation of patient's condition, participation in multidisciplinary rounds and medical decision making of high complexity in the care of this patient.

## 2019-02-23 NOTE — Plan of Care (Signed)
Patient progressing towards plan of care goals. 

## 2019-02-23 NOTE — ED Triage Notes (Signed)
Pt bib [facility] for a code stroke, LNW reported by EMS was 930 this morning. Pt has right sided facial droop and right arm weakness. Pt arrives to ED with right sided deficits, states he has not felt right since Friday. Pt taken to CT. Code stroke cancelled at 1135.

## 2019-02-24 ENCOUNTER — Inpatient Hospital Stay (HOSPITAL_COMMUNITY): Payer: Medicaid Other

## 2019-02-24 DIAGNOSIS — N179 Acute kidney failure, unspecified: Secondary | ICD-10-CM

## 2019-02-24 DIAGNOSIS — R45851 Suicidal ideations: Secondary | ICD-10-CM

## 2019-02-24 DIAGNOSIS — F332 Major depressive disorder, recurrent severe without psychotic features: Secondary | ICD-10-CM

## 2019-02-24 DIAGNOSIS — E785 Hyperlipidemia, unspecified: Secondary | ICD-10-CM

## 2019-02-24 DIAGNOSIS — E1159 Type 2 diabetes mellitus with other circulatory complications: Secondary | ICD-10-CM

## 2019-02-24 DIAGNOSIS — I2510 Atherosclerotic heart disease of native coronary artery without angina pectoris: Secondary | ICD-10-CM

## 2019-02-24 DIAGNOSIS — E119 Type 2 diabetes mellitus without complications: Secondary | ICD-10-CM

## 2019-02-24 DIAGNOSIS — Z79899 Other long term (current) drug therapy: Secondary | ICD-10-CM

## 2019-02-24 DIAGNOSIS — Z7289 Other problems related to lifestyle: Secondary | ICD-10-CM

## 2019-02-24 DIAGNOSIS — Z794 Long term (current) use of insulin: Secondary | ICD-10-CM

## 2019-02-24 DIAGNOSIS — I10 Essential (primary) hypertension: Secondary | ICD-10-CM

## 2019-02-24 DIAGNOSIS — E782 Mixed hyperlipidemia: Secondary | ICD-10-CM

## 2019-02-24 DIAGNOSIS — G8191 Hemiplegia, unspecified affecting right dominant side: Secondary | ICD-10-CM

## 2019-02-24 DIAGNOSIS — E669 Obesity, unspecified: Secondary | ICD-10-CM

## 2019-02-24 DIAGNOSIS — I6381 Other cerebral infarction due to occlusion or stenosis of small artery: Principal | ICD-10-CM

## 2019-02-24 DIAGNOSIS — I340 Nonrheumatic mitral (valve) insufficiency: Secondary | ICD-10-CM

## 2019-02-24 DIAGNOSIS — R29810 Facial weakness: Secondary | ICD-10-CM

## 2019-02-24 LAB — RAPID URINE DRUG SCREEN, HOSP PERFORMED
Amphetamines: NOT DETECTED
Barbiturates: NOT DETECTED
Benzodiazepines: NOT DETECTED
Cocaine: NOT DETECTED
Opiates: NOT DETECTED
Tetrahydrocannabinol: NOT DETECTED

## 2019-02-24 LAB — BASIC METABOLIC PANEL WITH GFR
Anion gap: 11 (ref 5–15)
BUN: 10 mg/dL (ref 6–20)
CO2: 28 mmol/L (ref 22–32)
Calcium: 9 mg/dL (ref 8.9–10.3)
Chloride: 102 mmol/L (ref 98–111)
Creatinine, Ser: 1.05 mg/dL (ref 0.61–1.24)
GFR calc Af Amer: >60 mL/min
GFR calc non Af Amer: >60 mL/min
Glucose, Bld: 206 mg/dL — ABNORMAL HIGH (ref 70–99)
Potassium: 4.1 mmol/L (ref 3.5–5.1)
Sodium: 141 mmol/L (ref 135–145)

## 2019-02-24 LAB — GLUCOSE, CAPILLARY
Glucose-Capillary: 182 mg/dL — ABNORMAL HIGH (ref 70–99)
Glucose-Capillary: 204 mg/dL — ABNORMAL HIGH (ref 70–99)
Glucose-Capillary: 179 mg/dL — ABNORMAL HIGH (ref 70–99)
Glucose-Capillary: 162 mg/dL — ABNORMAL HIGH (ref 70–99)

## 2019-02-24 LAB — ECHOCARDIOGRAM COMPLETE
Height: 66 in
Weight: 2634.94 oz

## 2019-02-24 MED ORDER — INSULIN GLARGINE 100 UNIT/ML ~~LOC~~ SOLN
7.0000 [IU] | Freq: Every day | SUBCUTANEOUS | Status: DC
  Administered 2019-02-24: 22:00:00 7 [IU] via SUBCUTANEOUS
  Filled 2019-02-24 (×2): qty 0.07

## 2019-02-24 MED ORDER — ATORVASTATIN CALCIUM 40 MG PO TABS
40.0000 mg | ORAL_TABLET | Freq: Every day | ORAL | Status: DC
  Administered 2019-02-24: 40 mg via ORAL
  Filled 2019-02-24: qty 1

## 2019-02-24 MED ORDER — ASPIRIN EC 81 MG PO TBEC
81.0000 mg | DELAYED_RELEASE_TABLET | Freq: Every day | ORAL | Status: DC
  Administered 2019-02-24 – 2019-02-25 (×2): 81 mg via ORAL
  Filled 2019-02-24 (×2): qty 1

## 2019-02-24 MED ORDER — CLOPIDOGREL BISULFATE 75 MG PO TABS
75.0000 mg | ORAL_TABLET | Freq: Every day | ORAL | Status: DC
  Administered 2019-02-24 – 2019-02-25 (×2): 75 mg via ORAL
  Filled 2019-02-24 (×2): qty 1

## 2019-02-24 MED ORDER — EZETIMIBE 10 MG PO TABS
10.0000 mg | ORAL_TABLET | Freq: Every day | ORAL | Status: DC
  Administered 2019-02-24 – 2019-02-25 (×2): 10 mg via ORAL
  Filled 2019-02-24 (×2): qty 1

## 2019-02-24 NOTE — Consult Note (Signed)
Physical Medicine and Rehabilitation Consult  Reason for Consult: Stroke with functional deficits. Referring Physician: Dr.Butcher   HPI: Christian [name] is a 48 y.o. male with history of CAD s/p stent, T2DM--with neuropathy,  scoliosis, depression with SI,  who was admitted on 02/23/19 with 3- 4 day history of RLE weakness with difficulty walking and malaise. He was evaluated at MD office and found ot have left facial droop and slurred speech therefore sent to ED for work up. CTA head/neck negative for LVO. Patient reports lack of insurance --no PCP and has not had any medications till 11/20 admission.    MRI brain done revealing acute infarct in left basal ganglia radiating to white matter tracks. 2 D echo done today and work up underway. Therapy evaluations today revealed shuffling gait with cognitive deficits affecting functional status. CIR recommended for follow up therapy.    Review of Systems  Constitutional: Negative for chills and fever.  HENT: Negative for hearing loss and tinnitus.   Eyes: Negative for blurred vision (going down) and double vision.  Respiratory: Negative for cough and shortness of breath.   Cardiovascular: Negative for chest pain and palpitations.  Gastrointestinal: Negative for abdominal pain and heartburn.  Genitourinary: Negative for dysuria and urgency.  Musculoskeletal: Positive for joint pain (hips/knees). Negative for back pain and myalgias.  Skin: Negative for itching and rash.  Neurological: Positive for sensory change (bilateral feet) and weakness. Negative for dizziness and headaches.  Psychiatric/Behavioral: The patient is nervous/anxious.       Past Medical History:  Diagnosis Date   CAD (coronary artery disease)    a. 07/2013 NSTEMI/PCI: LCX 75m (4.0x23 Xience DES), RCA 74m, EF > 55%;  b. 07/2013 Echo: EF 60-65%, diast dysfxn, mild LVH, mildly dil LA, nl RV size/fxn, nl RVSP.   History of tobacco abuse    a. quit ~ 2010   Hyperlipidemia     Morbid obesity ([place])    a. weighed 168 @ age 37.   Scoliosis     Past Surgical History:  Procedure Laterality Date   ADENOIDECTOMY     CARDIAC CATHETERIZATION  07/2013   [facility]'x1 stent    Family History  Problem Relation Age of Onset   Heart disease Mother    Esophageal cancer Father     Social History:  Lives with mother who has mild dementia. Stephanie [employer] by trade ---still works?. He  reports that he has quit smoking--20 years ago. His smoking use included cigarettes. He has a 10.00 pack-year smoking history. He has never used smokeless tobacco. He reports current alcohol use--liquor once a week.  He reports that he does not use drugs.    Allergies: No Known Allergies    Medications Prior to Admission  Medication Sig Dispense Refill   aspirin 81 MG chewable tablet Chew 1 tablet (81 mg total) by mouth daily. 30 tablet 1   atorvastatin (LIPITOR) 10 MG tablet Take 1 tablet (10 mg total) by mouth daily at 6 PM. 30 tablet 1   carvedilol (COREG) 25 MG tablet Take 1 tablet (25 mg total) by mouth 2 (two) times daily with a meal. 60 tablet 1   gabapentin (NEURONTIN) 100 MG capsule Take 2 capsules (200 mg total) by mouth 3 (three) times daily. 180 capsule 1   glipiZIDE (GLUCOTROL) 10 MG tablet Take 1 tablet (10 mg total) by mouth 2 (two) times daily before a meal. 60 tablet 1   insulin glargine (LANTUS) 100 UNIT/ML injection Inject 0.1  mLs (10 Units total) into the skin at bedtime. 10 mL 1   losartan (COZAAR) 100 MG tablet Take 1 tablet (100 mg total) by mouth daily. 30 tablet 1   metFORMIN (GLUCOPHAGE) 1000 MG tablet Take 1 tablet (1,000 mg total) by mouth 2 (two) times daily with a meal. 60 tablet 1   mirtazapine (REMERON) 30 MG tablet Take 1 tablet (30 mg total) by mouth at bedtime. 30 tablet 1   traZODone (DESYREL) 50 MG tablet Take 1 tablet (50 mg total) by mouth at bedtime as needed for sleep. (Patient taking differently: Take 50 mg by mouth at bedtime. ) 30 tablet 1     Home: Home Living Family/patient expects to be discharged to:: Private residence Living Arrangements: Parent Available Help at Discharge: Available PRN/intermittently, Family Type of Home: House Home Access: Stairs to enter Technical brewer of Steps: 5 Entrance Stairs-Rails: Left Home Layout: One level Bathroom Shower/Tub: Chiropodist: Standard Bathroom Accessibility: No Home Equipment: Radio producer - single point  Lives With: Family  Functional History: Prior Function Level of Independence: Independent Comments: pt was assisting his mother with early onset dementia;sister was managing pt's mom's medication and finances, pt was assisting mom physically and with home management Functional Status:  Mobility: Bed Mobility Overal bed mobility: Needs Assistance Bed Mobility: Supine to Sit Supine to sit: Min guard Transfers Overall transfer level: Needs assistance Equipment used: None Transfers: Sit to/from Stand Sit to Stand: Min assist General transfer comment: minA for powerup Ambulation/Gait Ambulation/Gait assistance: Min guard Gait Distance (Feet): 150 Feet Assistive device: None Gait Pattern/deviations: Decreased stride length, Shuffle, Narrow base of support, Step-through pattern General Gait Details: very small steps, narrow base of support. Decreased arm swing bilaterally Gait velocity: decreased    ADL: ADL Overall ADL's : Needs assistance/impaired Eating/Feeding: Set up, Sitting Eating/Feeding Details (indicate cue type and reason): reports difficulty with manipulating utensils;would benefit from built up grip  Grooming: Min guard, Minimal assistance, Standing Upper Body Bathing: Min guard, Sitting Lower Body Bathing: Min guard, Minimal assistance, Sit to/from stand Upper Body Dressing : Minimal assistance, Sitting Lower Body Dressing: Minimal assistance, Sit to/from stand Lower Body Dressing Details (indicate cue type and reason):  doffed/donned socks sitting EOB right lateral lean noted at times  Toilet Transfer: Minimal assistance, Ambulation Toilet Transfer Details (indicate cue type and reason): simulated Toileting- Clothing Manipulation and Hygiene: Minimal assistance, Sit to/from stand Functional mobility during ADLs: Minimal assistance General ADL Comments: limited by decreased activity tolerance, decreased functional use of RUE, decreased stability   Cognition: Cognition Overall Cognitive Status: Within Functional Limits for tasks assessed Orientation Level: Oriented X4 Attention: Sustained Sustained Attention: Appears intact Sustained Attention Impairment: Verbal basic, Verbal complex Memory: Impaired(recalled 3/5 words independently) Memory Impairment: Retrieval deficit Awareness: Appears intact(aware of need to call for help due to fall risk) Problem Solving: Impaired Problem Solving Impairment: (verbal and written subtraction impaired) Safety/Judgment: Impaired(denies benefit of help with his medication management, etc) Cognition Arousal/Alertness: Awake/alert Behavior During Therapy: WFL for tasks assessed/performed Overall Cognitive Status: Within Functional Limits for tasks assessed   Blood pressure 130/85, pulse 74, temperature 98.2 F (36.8 C), temperature source Oral, resp. rate 18, height 5\' 6"  (1.676 m), weight 74.7 kg, SpO2 97 %.  Physical Exam  Nursing note and vitals reviewed. Constitutional: He is oriented to person, place, and time. He appears well-developed and well-nourished. No distress.  Respiratory: No stridor.  Musculoskeletal:        General: No edema. 5/5 strength on his  left side, 3/5 proximally in RLE, 5/5 distally, 4+/5 in RUE except for 4-/5 EF and handgrip. Sensation intact throughout.  Neurological: He is alert and oriented to person, place, and time.  Skin: He is not diaphoretic.  Gen: no distress, normal appearing HEENT: oral mucosa pink and moist, NCAT Cardio: Reg  rate Chest: normal effort, normal rate of breathing Abd: soft, non-distended Ext: no edema Psych: pleasant, normal affect  Results for orders placed or performed during the hospital encounter of 02/23/19 (from the past 24 hour(s))  HIV Antibody (routine testing w rflx)     Status: None   Collection Time: 02/23/19  1:50 PM  Result Value Ref Range   HIV Screen 4th Generation wRfx NON REACTIVE NON REACTIVE  Hemoglobin A1c     Status: Abnormal   Collection Time: 02/23/19  1:50 PM  Result Value Ref Range   Hgb A1c MFr Bld 8.7 (H) 4.8 - 5.6 %   Mean Plasma Glucose 202.99 mg/dL  Lipid panel     Status: Abnormal   Collection Time: 02/23/19  1:50 PM  Result Value Ref Range   Cholesterol 191 0 - 200 mg/dL   Triglycerides 393 (H) <150 mg/dL   HDL 44 >40 mg/dL   Total CHOL/HDL Ratio 4.3 RATIO   VLDL 79 (H) 0 - 40 mg/dL   LDL Cholesterol 68 0 - 99 mg/dL  Glucose, capillary     Status: Abnormal   Collection Time: 02/23/19  5:51 PM  Result Value Ref Range   Glucose-Capillary 161 (H) 70 - 99 mg/dL  Glucose, capillary     Status: Abnormal   Collection Time: 02/23/19  9:01 PM  Result Value Ref Range   Glucose-Capillary 204 (H) 70 - 99 mg/dL  Glucose, capillary     Status: Abnormal   Collection Time: 02/24/19  6:03 AM  Result Value Ref Range   Glucose-Capillary 182 (H) 70 - 99 mg/dL  Basic metabolic panel     Status: Abnormal   Collection Time: 02/24/19 12:05 PM  Result Value Ref Range   Sodium 141 135 - 145 mmol/L   Potassium 4.1 3.5 - 5.1 mmol/L   Chloride 102 98 - 111 mmol/L   CO2 28 22 - 32 mmol/L   Glucose, Bld 206 (H) 70 - 99 mg/dL   BUN 10 6 - 20 mg/dL   Creatinine, Ser 1.05 0.61 - 1.24 mg/dL   Calcium 9.0 8.9 - 10.3 mg/dL   GFR calc non Af Amer >60 >60 mL/min   GFR calc Af Amer >60 >60 mL/min   Anion gap 11 5 - 15  Urine rapid drug screen (hosp performed)not at [facility]     Status: None   Collection Time: 02/24/19 12:26 PM  Result Value Ref Range   Opiates NONE DETECTED NONE  DETECTED   Cocaine NONE DETECTED NONE DETECTED   Benzodiazepines NONE DETECTED NONE DETECTED   Amphetamines NONE DETECTED NONE DETECTED   Tetrahydrocannabinol NONE DETECTED NONE DETECTED   Barbiturates NONE DETECTED NONE DETECTED  Glucose, capillary     Status: Abnormal   Collection Time: 02/24/19 12:26 PM  Result Value Ref Range   Glucose-Capillary 204 (H) 70 - 99 mg/dL   Ct Angio Head W Or Wo Contrast  Result Date: 02/23/2019 CLINICAL DATA:  Focal neuro deficit, greater than 6 hours, stroke suspected. Additional history provided: Right-sided weakness, slurred speech. EXAM: CT ANGIOGRAPHY HEAD AND NECK TECHNIQUE: Multidetector CT imaging of the head and neck was performed using the standard protocol during bolus  administration of intravenous contrast. Multiplanar CT image reconstructions and MIPs were obtained to evaluate the vascular anatomy. Carotid stenosis measurements (when applicable) are obtained utilizing NASCET criteria, using the distal internal carotid diameter as the denominator. CONTRAST:  91mL OMNIPAQUE IOHEXOL 350 MG/ML SOLN COMPARISON:  Noncontrast CT head performed earlier the same day 02/23/2019. FINDINGS: CTA NECK FINDINGS Aortic arch: Common origin of the innominate and left common carotid arteries. Right carotid system: CCA and ICA patent within the neck without stenosis. No significant atherosclerotic disease. Left carotid system: CCA and ICA patent within the neck without stenosis. No significant atherosclerotic disease. Vertebral arteries: Codominant. The vertebral arteries are patent within the neck bilaterally without stenosis. Skeleton: No acute bony abnormality. Other neck: No neck mass or cervical lymphadenopathy. Thyroid negative Upper chest: No consolidation within the imaged lung apices. Review of the MIP images confirms the above findings CTA HEAD FINDINGS Anterior circulation: The intracranial internal carotid arteries are patent without stenosis. Only minimal  calcified plaque within the left ICA siphon. The right middle and anterior cerebral arteries are patent without significant proximal stenosis. The left middle and anterior cerebral arteries are patent without significant proximal stenosis. No intracranial aneurysm is identified. Posterior circulation: The intracranial vertebral arteries are patent without significant stenosis, as is the basilar artery. The bilateral posterior cerebral arteries are patent without significant proximal stenosis. Venous sinuses: Within limitations of contrast timing, no convincing thrombus. Anatomic variants: Posterior communicating arteries are poorly delineated and may be hypoplastic or absent bilaterally. Review of the MIP images confirms the above findings IMPRESSION: CTA neck: The common carotid, internal carotid and vertebral arteries are patent within the neck without significant stenosis. No significant atherosclerotic disease. CTA head: No intracranial large vessel occlusion or proximal high-grade arterial stenosis. Electronically Signed   By: Kellie Simmering DO   On: 02/23/2019 12:30   Ct Angio Neck W Or Wo Contrast  Result Date: 02/23/2019 CLINICAL DATA:  Focal neuro deficit, greater than 6 hours, stroke suspected. Additional history provided: Right-sided weakness, slurred speech. EXAM: CT ANGIOGRAPHY HEAD AND NECK TECHNIQUE: Multidetector CT imaging of the head and neck was performed using the standard protocol during bolus administration of intravenous contrast. Multiplanar CT image reconstructions and MIPs were obtained to evaluate the vascular anatomy. Carotid stenosis measurements (when applicable) are obtained utilizing NASCET criteria, using the distal internal carotid diameter as the denominator. CONTRAST:  19mL OMNIPAQUE IOHEXOL 350 MG/ML SOLN COMPARISON:  Noncontrast CT head performed earlier the same day 02/23/2019. FINDINGS: CTA NECK FINDINGS Aortic arch: Common origin of the innominate and left common carotid  arteries. Right carotid system: CCA and ICA patent within the neck without stenosis. No significant atherosclerotic disease. Left carotid system: CCA and ICA patent within the neck without stenosis. No significant atherosclerotic disease. Vertebral arteries: Codominant. The vertebral arteries are patent within the neck bilaterally without stenosis. Skeleton: No acute bony abnormality. Other neck: No neck mass or cervical lymphadenopathy. Thyroid negative Upper chest: No consolidation within the imaged lung apices. Review of the MIP images confirms the above findings CTA HEAD FINDINGS Anterior circulation: The intracranial internal carotid arteries are patent without stenosis. Only minimal calcified plaque within the left ICA siphon. The right middle and anterior cerebral arteries are patent without significant proximal stenosis. The left middle and anterior cerebral arteries are patent without significant proximal stenosis. No intracranial aneurysm is identified. Posterior circulation: The intracranial vertebral arteries are patent without significant stenosis, as is the basilar artery. The bilateral posterior cerebral arteries are patent without significant  proximal stenosis. Venous sinuses: Within limitations of contrast timing, no convincing thrombus. Anatomic variants: Posterior communicating arteries are poorly delineated and may be hypoplastic or absent bilaterally. Review of the MIP images confirms the above findings IMPRESSION: CTA neck: The common carotid, internal carotid and vertebral arteries are patent within the neck without significant stenosis. No significant atherosclerotic disease. CTA head: No intracranial large vessel occlusion or proximal high-grade arterial stenosis. Electronically Signed   By: Kellie Simmering DO   On: 02/23/2019 12:30   Mr Brain Wo Contrast  Result Date: 02/23/2019 CLINICAL DATA:  Hypertension, diabetes and lipid disorder. Progressive right-sided weakness over the last  several days. EXAM: MRI HEAD WITHOUT CONTRAST TECHNIQUE: Multiplanar, multiecho pulse sequences of the brain and surrounding structures were obtained without intravenous contrast. COMPARISON:  CT studies same day. FINDINGS: Brain: Diffusion imaging shows acute infarction within the left basal ganglia and radiating white matter tracts. No other acute insult. Small focus of T2 shine through in the right posterior parietal subcortical white matter. Elsewhere, brainstem and cerebellum are normal. Old small vessel infarctions affect the right thalamus and both basal ganglia. No large vessel territory infarction. No mass lesion, hemorrhage, hydrocephalus or extra-axial collection. Vascular: Major vessels at the base of the brain show flow. Skull and upper cervical spine: Negative Sinuses/Orbits: Clear/normal Other: None IMPRESSION: Acute infarction in the left basal ganglia and radiating white matter tracts consistent with lenticulostriate vessel insult. No significant swelling. No visible hemorrhage. Old small vessel ischemic changes elsewhere throughout the brain as outlined above. Electronically Signed   By: Nelson Chimes M.D.   On: 02/23/2019 16:50   Ct Head Code Stroke Wo Contrast  Result Date: 02/23/2019 CLINICAL DATA:  Code stroke.  Right-sided weakness EXAM: CT HEAD WITHOUT CONTRAST TECHNIQUE: Contiguous axial images were obtained from the base of the skull through the vertex without intravenous contrast. COMPARISON:  2015. FINDINGS: Brain: There is no acute intracranial hemorrhage or mass effect. Gray-white differentiation is preserved. Patchy hypoattenuation in the supratentorial white matter is nonspecific but may reflect mild chronic microvascular ischemic changes. There are new age-indeterminate small vessel infarcts of bilateral basal ganglia and adjacent white matter as well as the right thalamus. Prominence of the ventricles and sulci reflects minor parenchymal volume loss. Vascular: No hyperdense  vessel. Skull: Unremarkable. Sinuses/Orbits: Trace mucosal thickening.  Orbits are unremarkable. Other: Mastoid air cells are clear. ASPECTS [facility] Stroke [facility] Early CT Score) - Ganglionic level infarction (caudate, lentiform nuclei, internal capsule, insula, M1-M3 cortex): 7 - Supraganglionic infarction (M4-M6 cortex): 3 Total score (0-10 with 10 being normal): 10 IMPRESSION: 1. No acute intracranial hemorrhage or definite acute infarction. ASPECTS is 10. 2. New age-indeterminate small vessel infarcts of bilateral basal ganglia and adjacent white matter as well as the right thalamus. 3. Progression of mild chronic microvascular ischemic changes and minor parenchymal volume loss since 2015. These results were communicated to Dr. Rory Percy at 11:44 amon 12/1/2020by text page via the [facility] messaging system. Electronically Signed   By: Macy Mis M.D.   On: 02/23/2019 11:49    Assessment and Plan:  Assessment/Plan: Diagnosis: Acute left basal ganglia infarct 1. Does the need for close, 24 hr/day medical supervision in concert with the patient's rehab needs make it unreasonable for this patient to be served in a less intensive setting? Yes 2. Co-Morbidities requiring supervision/potential complications: CAD s/p stent, Type 2 DM with neuropathy, scoliosis, depression with SI 3. Due to safety, skin/wound care, disease management, medication administration and patient education, does the patient  require 24 hr/day rehab nursing? Yes 4. Does the patient require coordinated care of a physician, rehab nurse, therapy disciplines of PT, OT, neuropsych to address physical and functional deficits in the context of the above medical diagnosis(es)? Yes Addressing deficits in the following areas: balance, endurance, locomotion, strength, transferring, bowel/bladder control, bathing, dressing, feeding, grooming, toileting and psychosocial support 5. Can the patient actively participate in an intensive therapy program  of at least 3 hrs of therapy per day at least 5 days per week? Yes 6. The potential for patient to make measurable gains while on inpatient rehab is excellent 7. Anticipated functional outcomes upon discharge from inpatient rehab are modified independent  with PT, modified independent with OT, independent with SLP. 8. Estimated rehab length of stay to reach the above functional goals is: 7-10 days 9. Anticipated discharge destination: Home 10. Overall Rehab/Functional Prognosis: excellent  RECOMMENDATIONS: This patient's condition is appropriate for continued rehabilitative care in the following setting: CIR Patient has agreed to participate in recommended program. Yes Note that insurance prior authorization may be required for reimbursement for recommended care.  Comment: Mr. Granieri would benefit from inpatient rehabilitation to maximize his safety and function. He would benefit from neuropsych evaluation while in rehab.   Bary Leriche, PA-C 02/24/2019   I have personally performed a face to face diagnostic evaluation, including, but not limited to relevant history and physical exam findings, of this patient and developed relevant assessment and plan.  Additionally, I have reviewed and concur with the physician assistant's documentation above.  Leeroy Cha, MD

## 2019-02-24 NOTE — Progress Notes (Signed)
  Echocardiogram 2D Echocardiogram has been performed.  Christian [name] 02/24/2019, 1:42 PM

## 2019-02-24 NOTE — Progress Notes (Signed)
STROKE TEAM PROGRESS NOTE   INTERVAL HISTORY Sister at bedside. Pt finished lunch, sitting in bed. Awake alert, still has right facial droop and right sided hemiparesis. PT/OT recommend CIR.   Vitals:   02/23/19 2257 02/24/19 0300 02/24/19 0336 02/24/19 0857  BP: 137/85  (!) 141/88 (!) 159/94  Pulse: 72  67 83  Resp: 15 19 15 18   Temp: 98.8 F (37.1 C)  98.8 F (37.1 C) 97.8 F (36.6 C)  TempSrc: Oral  Oral Oral  SpO2: 95%  96% 98%  Weight:      Height:        CBC:  Recent Labs  Lab 02/23/19 1127 02/23/19 1157  WBC 5.8  --   NEUTROABS 3.6  --   HGB 13.6 12.6*  HCT 38.6* 37.0*  MCV 93.7  --   PLT 198  --     Basic Metabolic Panel:  Recent Labs  Lab 02/23/19 1127 02/23/19 1157  NA 141 140  K 3.9 3.8  CL 102 103  CO2 22  --   GLUCOSE 248* 240*  BUN 15 17  CREATININE 1.49* 1.20  CALCIUM 9.4  --    Lipid Panel:     Component Value Date/Time   CHOL 191 02/23/2019 1350   CHOL 180 09/30/2013 1447   CHOL 160 08/11/2013 0340   TRIG 393 (H) 02/23/2019 1350   TRIG 213 (H) 08/11/2013 0340   HDL 44 02/23/2019 1350   HDL 35 (L) 09/30/2013 1447   HDL 25 (L) 08/11/2013 0340   CHOLHDL 4.3 02/23/2019 1350   VLDL 79 (H) 02/23/2019 1350   VLDL 43 (H) 08/11/2013 0340   LDLCALC 68 02/23/2019 1350   LDLCALC 117 (H) 09/30/2013 1447   LDLCALC 92 08/11/2013 0340   HgbA1c:  Lab Results  Component Value Date   HGBA1C 8.7 (H) 02/23/2019   Urine Drug Screen:     Component Value Date/Time   LABOPIA NONE DETECTED 02/03/2019 1344   COCAINSCRNUR NONE DETECTED 02/03/2019 1344   LABBENZ NONE DETECTED 02/03/2019 1344   AMPHETMU NONE DETECTED 02/03/2019 1344   THCU NONE DETECTED 02/03/2019 1344   LABBARB NONE DETECTED 02/03/2019 1344    Alcohol Level     Component Value Date/Time   ETH <10 02/03/2019 1344    IMAGING Ct Angio Head W Or Wo Contrast  Result Date: 02/23/2019 CLINICAL DATA:  Focal neuro deficit, greater than 6 hours, stroke suspected. Additional history  provided: Right-sided weakness, slurred speech. EXAM: CT ANGIOGRAPHY HEAD AND NECK TECHNIQUE: Multidetector CT imaging of the head and neck was performed using the standard protocol during bolus administration of intravenous contrast. Multiplanar CT image reconstructions and MIPs were obtained to evaluate the vascular anatomy. Carotid stenosis measurements (when applicable) are obtained utilizing NASCET criteria, using the distal internal carotid diameter as the denominator. CONTRAST:  83mL OMNIPAQUE IOHEXOL 350 MG/ML SOLN COMPARISON:  Noncontrast CT head performed earlier the same day 02/23/2019. FINDINGS: CTA NECK FINDINGS Aortic arch: Common origin of the innominate and left common carotid arteries. Right carotid system: CCA and ICA patent within the neck without stenosis. No significant atherosclerotic disease. Left carotid system: CCA and ICA patent within the neck without stenosis. No significant atherosclerotic disease. Vertebral arteries: Codominant. The vertebral arteries are patent within the neck bilaterally without stenosis. Skeleton: No acute bony abnormality. Other neck: No neck mass or cervical lymphadenopathy. Thyroid negative Upper chest: No consolidation within the imaged lung apices. Review of the MIP images confirms the above findings CTA HEAD FINDINGS  Anterior circulation: The intracranial internal carotid arteries are patent without stenosis. Only minimal calcified plaque within the left ICA siphon. The right middle and anterior cerebral arteries are patent without significant proximal stenosis. The left middle and anterior cerebral arteries are patent without significant proximal stenosis. No intracranial aneurysm is identified. Posterior circulation: The intracranial vertebral arteries are patent without significant stenosis, as is the basilar artery. The bilateral posterior cerebral arteries are patent without significant proximal stenosis. Venous sinuses: Within limitations of contrast  timing, no convincing thrombus. Anatomic variants: Posterior communicating arteries are poorly delineated and may be hypoplastic or absent bilaterally. Review of the MIP images confirms the above findings IMPRESSION: CTA neck: The common carotid, internal carotid and vertebral arteries are patent within the neck without significant stenosis. No significant atherosclerotic disease. CTA head: No intracranial large vessel occlusion or proximal high-grade arterial stenosis. Electronically Signed   By: Kellie Simmering DO   On: 02/23/2019 12:30   Ct Angio Neck W Or Wo Contrast  Result Date: 02/23/2019 CLINICAL DATA:  Focal neuro deficit, greater than 6 hours, stroke suspected. Additional history provided: Right-sided weakness, slurred speech. EXAM: CT ANGIOGRAPHY HEAD AND NECK TECHNIQUE: Multidetector CT imaging of the head and neck was performed using the standard protocol during bolus administration of intravenous contrast. Multiplanar CT image reconstructions and MIPs were obtained to evaluate the vascular anatomy. Carotid stenosis measurements (when applicable) are obtained utilizing NASCET criteria, using the distal internal carotid diameter as the denominator. CONTRAST:  80mL OMNIPAQUE IOHEXOL 350 MG/ML SOLN COMPARISON:  Noncontrast CT head performed earlier the same day 02/23/2019. FINDINGS: CTA NECK FINDINGS Aortic arch: Common origin of the innominate and left common carotid arteries. Right carotid system: CCA and ICA patent within the neck without stenosis. No significant atherosclerotic disease. Left carotid system: CCA and ICA patent within the neck without stenosis. No significant atherosclerotic disease. Vertebral arteries: Codominant. The vertebral arteries are patent within the neck bilaterally without stenosis. Skeleton: No acute bony abnormality. Other neck: No neck mass or cervical lymphadenopathy. Thyroid negative Upper chest: No consolidation within the imaged lung apices. Review of the MIP images  confirms the above findings CTA HEAD FINDINGS Anterior circulation: The intracranial internal carotid arteries are patent without stenosis. Only minimal calcified plaque within the left ICA siphon. The right middle and anterior cerebral arteries are patent without significant proximal stenosis. The left middle and anterior cerebral arteries are patent without significant proximal stenosis. No intracranial aneurysm is identified. Posterior circulation: The intracranial vertebral arteries are patent without significant stenosis, as is the basilar artery. The bilateral posterior cerebral arteries are patent without significant proximal stenosis. Venous sinuses: Within limitations of contrast timing, no convincing thrombus. Anatomic variants: Posterior communicating arteries are poorly delineated and may be hypoplastic or absent bilaterally. Review of the MIP images confirms the above findings IMPRESSION: CTA neck: The common carotid, internal carotid and vertebral arteries are patent within the neck without significant stenosis. No significant atherosclerotic disease. CTA head: No intracranial large vessel occlusion or proximal high-grade arterial stenosis. Electronically Signed   By: Kellie Simmering DO   On: 02/23/2019 12:30   Mr Brain Wo Contrast  Result Date: 02/23/2019 CLINICAL DATA:  Hypertension, diabetes and lipid disorder. Progressive right-sided weakness over the last several days. EXAM: MRI HEAD WITHOUT CONTRAST TECHNIQUE: Multiplanar, multiecho pulse sequences of the brain and surrounding structures were obtained without intravenous contrast. COMPARISON:  CT studies same day. FINDINGS: Brain: Diffusion imaging shows acute infarction within the left basal ganglia and radiating white  matter tracts. No other acute insult. Small focus of T2 shine through in the right posterior parietal subcortical white matter. Elsewhere, brainstem and cerebellum are normal. Old small vessel infarctions affect the right  thalamus and both basal ganglia. No large vessel territory infarction. No mass lesion, hemorrhage, hydrocephalus or extra-axial collection. Vascular: Major vessels at the base of the brain show flow. Skull and upper cervical spine: Negative Sinuses/Orbits: Clear/normal Other: None IMPRESSION: Acute infarction in the left basal ganglia and radiating white matter tracts consistent with lenticulostriate vessel insult. No significant swelling. No visible hemorrhage. Old small vessel ischemic changes elsewhere throughout the brain as outlined above. Electronically Signed   By: Nelson Chimes M.D.   On: 02/23/2019 16:50   Ct Head Code Stroke Wo Contrast  Result Date: 02/23/2019 CLINICAL DATA:  Code stroke.  Right-sided weakness EXAM: CT HEAD WITHOUT CONTRAST TECHNIQUE: Contiguous axial images were obtained from the base of the skull through the vertex without intravenous contrast. COMPARISON:  2015. FINDINGS: Brain: There is no acute intracranial hemorrhage or mass effect. Gray-white differentiation is preserved. Patchy hypoattenuation in the supratentorial white matter is nonspecific but may reflect mild chronic microvascular ischemic changes. There are new age-indeterminate small vessel infarcts of bilateral basal ganglia and adjacent white matter as well as the right thalamus. Prominence of the ventricles and sulci reflects minor parenchymal volume loss. Vascular: No hyperdense vessel. Skull: Unremarkable. Sinuses/Orbits: Trace mucosal thickening.  Orbits are unremarkable. Other: Mastoid air cells are clear. ASPECTS [facility] Stroke Program Early CT Score) - Ganglionic level infarction (caudate, lentiform nuclei, internal capsule, insula, M1-M3 cortex): 7 - Supraganglionic infarction (M4-M6 cortex): 3 Total score (0-10 with 10 being normal): 10 IMPRESSION: 1. No acute intracranial hemorrhage or definite acute infarction. ASPECTS is 10. 2. New age-indeterminate small vessel infarcts of bilateral basal ganglia and  adjacent white matter as well as the right thalamus. 3. Progression of mild chronic microvascular ischemic changes and minor parenchymal volume loss since 2015. These results were communicated to Dr. Rory Percy at 11:44 amon 12/1/2020by text page via the [facility] messaging system. Electronically Signed   By: Macy Mis M.D.   On: 02/23/2019 11:49    PHYSICAL EXAM  Temp:  [97.8 F (36.6 C)-99.1 F (37.3 C)] 98.2 F (36.8 C) (12/02 1228) Pulse Rate:  [67-86] 74 (12/02 1228) Resp:  [15-20] 18 (12/02 1228) BP: (117-159)/(79-94) 130/85 (12/02 1228) SpO2:  [95 %-99 %] 97 % (12/02 1228)  General - Well nourished, well developed, in no apparent distress.  Ophthalmologic - fundi not visualized due to noncooperation.  Cardiovascular - Regular rhythm and rate.  Mental Status -  Level of arousal and orientation to time, place, and person were intact. Language including expression, naming, repetition, comprehension was assessed and found intact. Fund of Knowledge was assessed and was intact.  Cranial Nerves II - XII - II - Visual field intact OU. III, IV, VI - Extraocular movements intact. V - Facial sensation intact bilaterally. VII - right facial droop. VIII - Hearing & vestibular intact bilaterally. X - Palate elevates symmetrically. XI - Chin turning & shoulder shrug intact bilaterally. XII - Tongue protrusion intact.  Motor Strength - The patient's strength was normal in LUE and LLE, however, RUE 4/5 with dexterity difficulty, RLE 3/5 knee flexion and 4/5 foot DF and PF and pronator drift was present.  Bulk was normal and fasciculations were absent.   Motor Tone - Muscle tone was assessed at the neck and appendages and was normal.  Reflexes - The patient's  reflexes were symmetrical in all extremities and he had no pathological reflexes.  Sensory - Light touch, temperature/pinprick were assessed and were symmetrical.    Coordination - The patient had normal movements in the hands with no  ataxia or dysmetria although slow on the right.  Tremor was absent.  Gait and Station - deferred.   ASSESSMENT/PLAN [name] is a 48 y.o. male with history of hyperlipidemia,  Hypertension, obesity, coronary artery disease and major depression with SI presenting with R sided weakness and slurred speech since 11/27.   Stroke:   L BG/CR scattered infarcts as well as right occipital juxtacortical punctate infarct, likely secondary to synchronized small vessel disease source. However, embolic source can not be completely ruled out    Code Stroke CT head No acute abnormality. New B basal ganglia and adjacent white matter, R thalamic infarcts.  Small vessel disease. Atrophy. ASPECTS 10.     CTA head and neck Unremarkable   MRI  L BG/CR infarcts and right occipital juxtacortical punctate infarct. Small vessel disease.   2D Echo pending  Given stroke at different vascular territory, recommend 30 day cardiac event monitoring as outpt   LDL 68  HgbA1c 8.7  UDS neg  Lovenox 40 mg sq daily for VTE prophylaxis  aspirin 81 mg daily prior to admission, now on aspirin 81 mg daily and clopidogrel 75 mg daily. Continue DAPT x 3 weeks then plavix alone   Therapy recommendations:  CIR - may be too high level - admission coordinator following  Disposition:  pending   Hypertension  Stable . BP goal normotensive  Hyperlipidemia  Home meds:  lipitor 10  Now on lipitor 40 and zetia 10  LDL 68, goal < 70  TG was high at 393  Continue statin and zetia at discharge  Diabetes type II Uncontrolled  HgbA1c 8.7, goal < 7.0  CBGs  SSI  Close PCP follow up and better DM control  Other Stroke Risk Factors  Former Cigarette smoker, guit 10 yrs ago  Heavy ETOH use, alcohol level <10, advised to drink no more than 2 drink(s) a day  Coronary artery disease s/p PCI, stent  Other Active Problems  AKI  Hx severe depression w/ suicidal ideation  Hospital day # 1  Neurology  will sign off. Please call with questions. Pt will follow up with stroke clinic NP at [facility] in about 4 weeks. Thanks for the consult.   Rosalin Hawking, MD PhD Stroke Neurology 02/24/2019 2:24 PM    To contact Stroke Continuity provider, please refer to [email or web address] After hours, contact General Neurology

## 2019-02-24 NOTE — Evaluation (Signed)
Physical Therapy Evaluation Patient Details Name: Christian [name] MRN: [id number] DOB: 1971/02/10 Today's Date: 02/24/2019   History of Present Illness  Patient is a 48 year old male admitted with reported right side weakness, starting in LE and moving up to R UE. MRI shows small acute infarct in L basal ganglia. PMH includes: HTN, DM, HLD, obesity, CAD, depression with suicidal ideation  Clinical Impression  Patient received in bed, agreeable to PT/OT session. Reports weakness of Right side. Patient performed bed mobility with min guard, transfers with min guard and is able to ambulate 150 feet with min guard no assistive device. Patient has some inattention to his right side with mobility and difficulty walking, (decreased stride, decreased arm swing). He will benefit from continued skilled PT while here to improve strength on right side and improve functional mobility.       Follow Up Recommendations CIR    Equipment Recommendations  None recommended by PT    Recommendations for Other Services Rehab consult     Precautions / Restrictions Precautions Precautions: Fall Restrictions Weight Bearing Restrictions: No      Mobility  Bed Mobility Overal bed mobility: Needs Assistance Bed Mobility: Supine to Sit     Supine to sit: Min guard        Transfers Overall transfer level: Needs assistance Equipment used: None Transfers: Sit to/from Stand Sit to Stand: Min guard            Ambulation/Gait Ambulation/Gait assistance: Min guard Gait Distance (Feet): 150 Feet Assistive device: None Gait Pattern/deviations: Decreased stride length;Shuffle;Narrow base of support;Step-through pattern Gait velocity: decreased   General Gait Details: very small steps, narrow base of support. Decreased arm swing bilaterally  Stairs            Wheelchair Mobility    Modified Rankin (Stroke Patients Only) Modified Rankin (Stroke Patients Only) Pre-Morbid Rankin Score: No  symptoms Modified Rankin: Moderately severe disability     Balance Overall balance assessment: Needs assistance Sitting-balance support: Feet supported Sitting balance-Leahy Scale: Fair     Standing balance support: No upper extremity supported;During functional activity Standing balance-Leahy Scale: Fair Standing balance comment: no LOB however decreased attention to right side                             Pertinent Vitals/Pain Pain Assessment: No/denies pain    Home Living Family/patient expects to be discharged to:: Private residence Living Arrangements: Parent Available Help at Discharge: Available PRN/intermittently;Family Type of Home: House Home Access: Stairs to enter Entrance Stairs-Rails: Left Entrance Stairs-Number of Steps: 5 Home Layout: One level Home Equipment: Cane - single point      Prior Function Level of Independence: Independent         Comments: pt was assisting his mother with early onset dementia;sister was managing pt's mom's medication and finances, pt was assisting mom physically and with home management     Hand Dominance   Dominant Hand: Right    Extremity/Trunk Assessment   Upper Extremity Assessment Upper Extremity Assessment: Defer to OT evaluation    Lower Extremity Assessment Lower Extremity Assessment: RLE deficits/detail RLE Deficits / Details: 3+/5 generally throughout. RLE Sensation: WNL RLE Coordination: decreased gross motor;decreased fine motor    Cervical / Trunk Assessment Cervical / Trunk Assessment: Normal  Communication   Communication: Expressive difficulties  Cognition Arousal/Alertness: Awake/alert Behavior During Therapy: WFL for tasks assessed/performed Overall Cognitive Status: Within Functional Limits for tasks assessed  General Comments      Exercises     Assessment/Plan    PT Assessment Patient needs continued PT services   PT Problem List Decreased strength;Decreased balance;Decreased mobility;Decreased safety awareness;Decreased coordination       PT Treatment Interventions Therapeutic activities;Gait training;Therapeutic exercise;Stair training;Functional mobility training;Balance training;Neuromuscular re-education;Patient/family education    PT Goals (Current goals can be found in the Care Plan section)       Frequency Min 4X/week   Barriers to discharge Decreased caregiver support      Co-evaluation PT/OT/SLP Co-Evaluation/Treatment: Yes Reason for Co-Treatment: For patient/therapist safety;To address functional/ADL transfers PT goals addressed during session: Mobility/safety with mobility;Balance         AM-PAC PT "6 Clicks" Mobility  Outcome Measure Help needed turning from your back to your side while in a flat bed without using bedrails?: A Little Help needed moving from lying on your back to sitting on the side of a flat bed without using bedrails?: A Little Help needed moving to and from a bed to a chair (including a wheelchair)?: A Little Help needed standing up from a chair using your arms (e.g., wheelchair or bedside chair)?: A Little Help needed to walk in hospital room?: A Little Help needed climbing 3-5 steps with a railing? : A Little 6 Click Score: 18    End of Session Equipment Utilized During Treatment: Gait belt Activity Tolerance: Patient tolerated treatment well Patient left: in chair;with call bell/phone within reach Nurse Communication: Mobility status PT Visit Diagnosis: Unsteadiness on feet (R26.81);Muscle weakness (generalized) (M62.81);Difficulty in walking, not elsewhere classified (R26.2);Hemiplegia and hemiparesis Hemiplegia - Right/Left: Right Hemiplegia - dominant/non-dominant: Dominant Hemiplegia - caused by: Cerebral infarction    Time:  -      Charges:              Samanta Gal, PT, GCS 02/24/19,11:18 AM

## 2019-02-24 NOTE — TOC Initial Note (Signed)
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  Pt doesn't have a PcP or insurance. CM provided on AVS the number and address for clinic in [place] that will provide low cost health care and assist with his medications.  Pt normally drives and does have a sister that can assist as needed.  Pt will need medication assistance at d/c.  TOC following for further d/c needs.   Expected Discharge Plan: IP Rehab Facility Barriers to Discharge: Inadequate or no insurance, Barriers Unresolved (comment)   Patient Goals and CMS Choice        Expected Discharge Plan and Services Expected Discharge Plan: [facility]   Discharge Planning Services: CM Consult Post Acute Care Choice: IP Rehab Living arrangements for the past 2 months: Single Family Home                                      Prior Living Arrangements/Services Living arrangements for the past 2 months: Single Family Home Lives with:: Parents(caregiver for mother) Patient language and need for interpreter reviewed:: Yes(no needs) Do you feel safe going back to the place where you live?: Yes        Care giver support system in place?: No (comment) Current home services: DME(walker at home but doesnt use) Criminal Activity/Legal Involvement Pertinent to Current Situation/Hospitalization: No - Comment as needed  Activities of Daily Living Home Assistive Devices/Equipment: None ADL Screening (condition at time of admission) Patient's cognitive ability adequate to safely complete daily activities?: Yes Is the patient deaf or have difficulty hearing?: No Does the patient have difficulty seeing, even when wearing glasses/contacts?:  No Does the patient have difficulty concentrating, remembering, or making decisions?: No Patient able to express need for assistance with ADLs?: Yes Does the patient have difficulty dressing or bathing?: No Independently performs ADLs?: Yes (appropriate for developmental age) Does the patient have difficulty walking or climbing stairs?: No Weakness of Legs: Right Weakness of Arms/Hands: Right  Permission Sought/Granted                  Emotional Assessment Appearance:: Appears older than stated age Attitude/Demeanor/Rapport: Engaged Affect (typically observed): Accepting Orientation: : Oriented to Self, Oriented to Place, Oriented to  Time, Oriented to Situation   Psych Involvement: No (comment)  Admission diagnosis:  Cerebrovascular accident (CVA), unspecified mechanism ([place]) [I63.9] Patient Active Problem List   Diagnosis Date Noted  . Acute CVA (cerebrovascular accident) ([place]) 02/23/2019  . Major depression 02/05/2019  . Severe episode of recurrent major depressive disorder, without psychotic features ([place]) 05/29/2018  . Suicidal ideation 05/29/2018  . Alcohol use disorder, moderate, dependence ([place]) 05/29/2018  . Hyperosmolar non-ketotic state in patient with type 2 diabetes mellitus ([place]) 05/08/2018  . Chest pain 12/30/2017  . CAD (coronary artery disease)   . HTN (hypertension)   . Hyperlipidemia   . Diabetes mellitus, type II ([place])   . Morbid obesity ([place])   . History of tobacco abuse    PCP:  Patient, No Pcp Per Pharmacy:   [id number] -  [place] [place] [address] [place] [address] Phone: [phone] Fax: [phone]  [place], [place] - [place] [address] Phone: [phone] Fax: [phone]     Social Determinants of Health (SDOH) Interventions    Readmission Risk Interventions No flowsheet data found.

## 2019-02-24 NOTE — Discharge Instructions (Signed)
Hyperglycemia Hyperglycemia occurs when the level of sugar (glucose) in the blood is too high. Glucose is a type of sugar that provides the body's main source of energy. Certain hormones (insulin and glucagon) control the level of glucose in the blood. Insulin lowers blood glucose, and glucagon increases blood glucose. Hyperglycemia can result from having too little insulin in the bloodstream, or from the body not responding normally to insulin. Hyperglycemia occurs most often in people who have diabetes (diabetes mellitus), but it can happen in people who do not have diabetes. It can develop quickly, and it can be life-threatening if it causes you to become severely dehydrated (diabetic ketoacidosis or hyperglycemic hyperosmolar state). Severe hyperglycemia is a medical emergency. What are the causes? If you have diabetes, hyperglycemia may be caused by:  Diabetes medicine.  Medicines that increase blood glucose or affect your diabetes control.  Not eating enough, or not eating often enough.  Changes in physical activity level.  Being sick or having an infection. If you have prediabetes or undiagnosed diabetes:  Hyperglycemia may be caused by those conditions. If you do not have diabetes, hyperglycemia may be caused by:  Certain medicines, including steroid medicines, beta-blockers, epinephrine, and thiazide diuretics.  Stress.  Serious illness.  Surgery.  Diseases of the pancreas.  Infection. What increases the risk? Hyperglycemia is more likely to develop in people who have risk factors for diabetes, such as:  Having a family member with diabetes.  Having a gene for type 1 diabetes that is passed from parent to child (inherited).  Living in an area with cold weather conditions.  Exposure to certain viruses.  Certain conditions in which the body's disease-fighting (immune) system attacks itself (autoimmune disorders).  Being overweight or obese.  Having an inactive  (sedentary) lifestyle.  Having been diagnosed with insulin resistance.  Having a history of prediabetes, gestational diabetes, or polycystic ovarian syndrome (PCOS).  Being of American-Indian, African-American, Hispanic/Latino, or Asian/Pacific Islander descent. What are the signs or symptoms? Hyperglycemia may not cause any symptoms. If you do have symptoms, they may include early warning signs, such as:  Increased thirst.  Hunger.  Feeling very tired.  Needing to urinate more often than usual.  Blurry vision. Other symptoms may develop if hyperglycemia gets worse, such as:  Dry mouth.  Loss of appetite.  Fruity-smelling breath.  Weakness.  Unexpected or rapid weight gain or weight loss.  Tingling or numbness in the hands or feet.  Headache.  Skin that does not quickly return to normal after being lightly pinched and released (poor skin turgor).  Abdominal pain.  Cuts or bruises that are slow to heal. How is this diagnosed? Hyperglycemia is diagnosed with a blood test to measure your blood glucose level. This blood test is usually done while you are having symptoms. Your health care provider may also do a physical exam and review your medical history. You may have more tests to determine the cause of your hyperglycemia, such as:  A fasting blood glucose (FBG) test. You will not be allowed to eat (you will fast) for at least 8 hours before a blood sample is taken.  An A1c (hemoglobin A1c) blood test. This provides information about blood glucose control over the previous 2-3 months.  An oral glucose tolerance test (OGTT). This measures your blood glucose at two times: ? After fasting. This is your baseline blood glucose level. ? Two hours after drinking a beverage that contains glucose. How is this treated? Treatment depends on the cause  of your hyperglycemia. Treatment may include:  Taking medicine to regulate your blood glucose levels. If you take insulin or  other diabetes medicines, your medicine or dosage may be adjusted.  Lifestyle changes, such as exercising more, eating healthier foods, or losing weight.  Treating an illness or infection, if this caused your hyperglycemia.  Checking your blood glucose more often.  Stopping or reducing steroid medicines, if these caused your hyperglycemia. If your hyperglycemia becomes severe and it results in hyperglycemic hyperosmolar state, you must be hospitalized and given IV fluids. Follow these instructions at home:  General instructions  Take over-the-counter and prescription medicines only as told by your health care provider.  Do not use any products that contain nicotine or tobacco, such as cigarettes and e-cigarettes. If you need help quitting, ask your health care provider.  Limit alcohol intake to no more than 1 drink per day for nonpregnant women and 2 drinks per day for men. One drink equals 12 oz of beer, 5 oz of wine, or 1 oz of hard liquor.  Learn to manage stress. If you need help with this, ask your health care provider.  Keep all follow-up visits as told by your health care provider. This is important. Eating and drinking   Maintain a healthy weight.  Exercise regularly, as directed by your health care provider.  Stay hydrated, especially when you exercise, get sick, or spend time in hot temperatures.  Eat healthy foods, such as: ? Lean proteins. ? Complex carbohydrates. ? Fresh fruits and vegetables. ? Low-fat dairy products. ? Healthy fats.  Drink enough fluid to keep your urine clear or pale yellow. If you have diabetes:  Make sure you know the symptoms of hyperglycemia.  Follow your diabetes management plan, as told by your health care provider. Make sure you: ? Take your insulin and medicines as directed. ? Follow your exercise plan. ? Follow your meal plan. Eat on time, and do not skip meals. ? Check your blood glucose as often as directed. Make sure to  check your blood glucose before and after exercise. If you exercise longer or in a different way than usual, check your blood glucose more often. ? Follow your sick day plan whenever you cannot eat or drink normally. Make this plan in advance with your health care provider.  Share your diabetes management plan with people in your workplace, school, and household.  Check your urine for ketones when you are ill and as told by your health care provider.  Carry a medical alert card or wear medical alert jewelry. Contact a health care provider if:  Your blood glucose is at or above 240 mg/dL (13.3 mmol/L) for 2 days in a row.  You have problems keeping your blood glucose in your target range.  You have frequent episodes of hyperglycemia. Get help right away if:  You have difficulty breathing.  You have a change in how you think, feel, or act (mental status).  You have nausea or vomiting that does not go away. These symptoms may represent a serious problem that is an emergency. Do not wait to see if the symptoms will go away. Get medical help right away. Call your local emergency services (911 in the [place] Do not drive yourself to the hospital. Summary  Hyperglycemia occurs when the level of sugar (glucose) in the blood is too high.  Hyperglycemia is diagnosed with a blood test to measure your blood glucose level. This blood test is usually done while you are  having symptoms. Your health care provider may also do a physical exam and review your medical history.  If you have diabetes, follow your diabetes management plan as told by your health care provider.  Contact your health care provider if you have problems keeping your blood glucose in your target range. This information is not intended to replace advice given to you by your health care provider. Make sure you discuss any questions you have with your health care provider. Document Released: 09/04/2000 Document Revised: 11/27/2015  Document Reviewed: 11/27/2015 Elsevier Patient Education  [place].  Hemoglobin A1c Test Why am I having this test? You may have the hemoglobin A1c test (HbA1c test) done to:  Evaluate your risk for developing diabetes (diabetes mellitus).  Diagnose diabetes.  Monitor long-term control of blood sugar (glucose) in people who have diabetes and help make treatment decisions. This test may be done with other blood glucose tests, such as fasting blood glucose and oral glucose tolerance tests. What is being tested? Hemoglobin is a type of protein in the blood that carries oxygen. Glucose attaches to hemoglobin to form glycated hemoglobin. This test checks the amount of glycated hemoglobin in your blood, which is a good indicator of the average amount of glucose in your blood during the past 2-3 months. What kind of sample is taken?  A blood sample is required for this test. It is usually collected by inserting a needle into a blood vessel. Tell a health care provider about:  All medicines you are taking, including vitamins, herbs, eye drops, creams, and over-the-counter medicines.  Any blood disorders you have.  Any surgeries you have had.  Any medical conditions you have.  Whether you are pregnant or may be pregnant. How are the results reported? Your results will be reported as a percentage that indicates how much of your hemoglobin has glucose attached to it (is glycated). Your health care provider will compare your results to normal ranges that were established after testing a large group of people (reference ranges). Reference ranges may vary among labs and hospitals. For this test, common reference ranges are:  Adult or child without diabetes: 4-5.6%.  Adult or child with diabetes and good blood glucose control: less than 7%. What do the results mean? If you have diabetes:  A result of less than 7% is considered normal, meaning that your blood glucose is well  controlled.  A result higher than 7% means that your blood glucose is not well controlled, and your treatment plan may need to be adjusted. If you do not have diabetes:  A result within the reference range is considered normal, meaning that you are not at high risk for diabetes.  A result of 5.7-6.4% means that you have a high risk of developing diabetes, and you may have prediabetes. Prediabetes is the condition of having a blood glucose level that is higher than it should be, but not high enough for you to be diagnosed with diabetes. Having prediabetes puts you at risk for developing type 2 diabetes (type 2 diabetes mellitus). You may have more tests, including a repeat HbA1c test.  Results of 6.5% or higher on two separate HbA1c tests mean that you have diabetes. You may have more tests to confirm the diagnosis. Abnormally low HbA1c values may be caused by:  Pregnancy.  Severe blood loss.  Receiving donated blood (transfusions).  Low red blood cell count (anemia).  Long-term kidney failure.  Some unusual forms (variants) of hemoglobin. Talk with your  health care provider about what your results mean. Questions to ask your health care provider Ask your health care provider, or the department that is doing the test:  When will my results be ready?  How will I get my results?  What are my treatment options?  What other tests do I need?  What are my next steps? Summary  The hemoglobin A1c test (HbA1c test) may be done to evaluate your risk for developing diabetes, to diagnose diabetes, and to monitor long-term control of blood sugar (glucose) in people who have diabetes and help make treatment decisions.  Hemoglobin is a type of protein in the blood that carries oxygen. Glucose attaches to hemoglobin to form glycated hemoglobin. This test checks the amount of glycated hemoglobin in your blood, which is a good indicator of the average amount of glucose in your blood during the  past 2-3 months.  Talk with your health care provider about what your results mean. This information is not intended to replace advice given to you by your health care provider. Make sure you discuss any questions you have with your health care provider. Document Released: 04/02/2004 Document Revised: 02/21/2017 Document Reviewed: 10/22/2016 Elsevier Patient Education  2020 [employer].

## 2019-02-24 NOTE — Progress Notes (Signed)
Inpatient Diabetes Program Recommendations  AACE/ADA: New Consensus Statement on Inpatient Glycemic Control (2015)  Target Ranges:  Prepandial:   less than 140 mg/dL      Peak postprandial:   less than 180 mg/dL (1-2 hours)      Critically ill patients:  140 - 180 mg/dL   Lab Results  Component Value Date   GLUCAP 182 (H) 02/24/2019   HGBA1C 8.7 (H) 02/23/2019    Review of Glycemic Control Results for KIERCE, CEFALO (MRN [id number]) as of 02/24/2019 12:08  Ref. Range 02/23/2019 17:51 02/23/2019 21:01 02/24/2019 06:03  Glucose-Capillary Latest Ref Range: 70 - 99 mg/dL 161 (H) 204 (H) 182 (H)   Diabetes history: Type 2 DM Outpatient Diabetes medications: Glipizide 10 mg BID, Metformin 1000 mg BID, Lantus 10 units QHS Current orders for Inpatient glycemic control: Novolog 0-15 units QHS, Novolog 0-5 units QHS  Inpatient Diabetes Program Recommendations:    Consider starting portion of patient's home basal: Levemir 8 units QD.   Spoke with patient regarding outpatient management of diabetes. Verified his home medications. Patient explains he has one vial of Lantus left, but after that he has no way of affording due to lack of insurance status. Reviewed patient's current A1c of 8.7%. Explained what a A1c is and what it measures. Also reviewed goal A1c with patient, importance of good glucose control @ home, and blood sugar goals. Reviewed patho of DM, need for PCP and follow up, role of pancreas, vascular changes and commorbidities. Patient has a meter and supplies at home, just picked them up the day before hospitalization. Encouraged to check atleast 2 times per day and take with him to the next appointment. Reviewed Relion products with patient and will attach to DC summary. 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   Denies drinking sugary beverages and he states, "I try to eat what I am supposed to." Consult for dietitian placed to answer further  questions.  CM consult placed.

## 2019-02-24 NOTE — Progress Notes (Signed)
Subjective: Christian [name] is feeling somewhat down today regarding his stroke.  He is not very spiritual or religious and does not wish to talk to anyone at this time.  He is not having any chest pain or shortness of breath.  We discussed the plan to medically optimize him prior to discharge.  We also discussed with him that he will need a primary care follow-up outpatient.  We will consult care management for assistance with this and with affording medications given he is uninsured.  Consults: neurology, PT/OT  Objective:  Vital signs in last 24 hours: Vitals:   02/23/19 2118 02/23/19 2257 02/24/19 0300 02/24/19 0336  BP: (!) 142/88 137/85  (!) 141/88  Pulse: 79 72  67  Resp: 19 15 19 15   Temp: 98.5 F (36.9 C) 98.8 F (37.1 C)  98.8 F (37.1 C)  TempSrc: Oral Oral  Oral  SpO2: 99% 95%  96%  Weight:      Height:       Physical Exam  Constitutional: No distress.  Cardiovascular: Normal rate, regular rhythm, normal heart sounds and intact distal pulses.  No murmur heard. Pulmonary/Chest: Effort normal and breath sounds normal. No respiratory distress. He exhibits no tenderness.  Abdominal: Soft. Bowel sounds are normal. He exhibits no distension. There is no abdominal tenderness.  Musculoskeletal: Normal range of motion.  Neurological: He is alert.  4/5 strength in right upper extremity, 3/5 strength in right lower extremity; 5/5 strength in left upper and lower extremities   Skin: Skin is warm and dry. He is not diaphoretic. No erythema.  Psychiatric:  Depressed mood  Nursing note and vitals reviewed.  I/Os:  Intake/Output Summary (Last 24 hours) at 02/24/2019 0616 Last data filed at 02/24/2019 [id number] Gross per 24 hour  Intake 1160.63 ml  Output 450 ml  Net 710.63 ml   Labs: Results for orders placed or performed during the hospital encounter of 02/23/19 (from the past 24 hour(s))  Protime-INR     Status: None   Collection Time: 02/23/19 11:27 AM  Result Value Ref Range   Prothrombin Time 13.0 11.4 - 15.2 seconds   INR 1.0 0.8 - 1.2  APTT     Status: None   Collection Time: 02/23/19 11:27 AM  Result Value Ref Range   aPTT 28 24 - 36 seconds  CBC     Status: Abnormal   Collection Time: 02/23/19 11:27 AM  Result Value Ref Range   WBC 5.8 4.0 - 10.5 K/uL   RBC 4.12 (L) 4.22 - 5.81 MIL/uL   Hemoglobin 13.6 13.0 - 17.0 g/dL   HCT 38.6 (L) 39.0 - 52.0 %   MCV 93.7 80.0 - 100.0 fL   MCH 33.0 26.0 - 34.0 pg   MCHC 35.2 30.0 - 36.0 g/dL   RDW 12.0 11.5 - 15.5 %   Platelets 198 150 - 400 K/uL   nRBC 0.0 0.0 - 0.2 %  Differential     Status: None   Collection Time: 02/23/19 11:27 AM  Result Value Ref Range   Neutrophils Relative % 63 %   Neutro Abs 3.6 1.7 - 7.7 K/uL   Lymphocytes Relative 26 %   Lymphs Abs 1.5 0.7 - 4.0 K/uL   Monocytes Relative 5 %   Monocytes Absolute 0.3 0.1 - 1.0 K/uL   Eosinophils Relative 4 %   Eosinophils Absolute 0.2 0.0 - 0.5 K/uL   Basophils Relative 1 %   Basophils Absolute 0.1 0.0 - 0.1 K/uL  Immature Granulocytes 1 %   Abs Immature Granulocytes 0.04 0.00 - 0.07 K/uL  Comprehensive metabolic panel     Status: Abnormal   Collection Time: 02/23/19 11:27 AM  Result Value Ref Range   Sodium 141 135 - 145 mmol/L   Potassium 3.9 3.5 - 5.1 mmol/L   Chloride 102 98 - 111 mmol/L   CO2 22 22 - 32 mmol/L   Glucose, Bld 248 (H) 70 - 99 mg/dL   BUN 15 6 - 20 mg/dL   Creatinine, Ser 1.49 (H) 0.61 - 1.24 mg/dL   Calcium 9.4 8.9 - 10.3 mg/dL   Total Protein 6.8 6.5 - 8.1 g/dL   Albumin 4.0 3.5 - 5.0 g/dL   AST 28 15 - 41 U/L   ALT 28 0 - 44 U/L   Alkaline Phosphatase 95 38 - 126 U/L   Total Bilirubin 1.0 0.3 - 1.2 mg/dL   GFR calc non Af Amer 54 (L) >60 mL/min   GFR calc Af Amer >60 >60 mL/min   Anion gap 17 (H) 5 - 15  CBG monitoring, ED     Status: Abnormal   Collection Time: 02/23/19 11:29 AM  Result Value Ref Range   Glucose-Capillary 247 (H) 70 - 99 mg/dL  I-stat chem 8, ED     Status: Abnormal   Collection Time:  02/23/19 11:57 AM  Result Value Ref Range   Sodium 140 135 - 145 mmol/L   Potassium 3.8 3.5 - 5.1 mmol/L   Chloride 103 98 - 111 mmol/L   BUN 17 6 - 20 mg/dL   Creatinine, Ser 1.20 0.61 - 1.24 mg/dL   Glucose, Bld 240 (H) 70 - 99 mg/dL   Calcium, Ion 1.22 1.15 - 1.40 mmol/L   TCO2 26 22 - 32 mmol/L   Hemoglobin 12.6 (L) 13.0 - 17.0 g/dL   HCT 37.0 (L) 39.0 - 52.0 %  SARS CORONAVIRUS 2 (TAT 6-24 HRS) Nasopharyngeal Nasopharyngeal Swab     Status: None   Collection Time: 02/23/19 12:13 PM   Specimen: Nasopharyngeal Swab  Result Value Ref Range   SARS Coronavirus 2 NEGATIVE NEGATIVE  HIV Antibody (routine testing w rflx)     Status: None   Collection Time: 02/23/19  1:50 PM  Result Value Ref Range   HIV Screen 4th Generation wRfx NON REACTIVE NON REACTIVE  Hemoglobin A1c     Status: Abnormal   Collection Time: 02/23/19  1:50 PM  Result Value Ref Range   Hgb A1c MFr Bld 8.7 (H) 4.8 - 5.6 %   Mean Plasma Glucose 202.99 mg/dL  Lipid panel     Status: Abnormal   Collection Time: 02/23/19  1:50 PM  Result Value Ref Range   Cholesterol 191 0 - 200 mg/dL   Triglycerides 393 (H) <150 mg/dL   HDL 44 >40 mg/dL   Total CHOL/HDL Ratio 4.3 RATIO   VLDL 79 (H) 0 - 40 mg/dL   LDL Cholesterol 68 0 - 99 mg/dL  Glucose, capillary     Status: Abnormal   Collection Time: 02/23/19  5:51 PM  Result Value Ref Range   Glucose-Capillary 161 (H) 70 - 99 mg/dL  Glucose, capillary     Status: Abnormal   Collection Time: 02/23/19  9:01 PM  Result Value Ref Range   Glucose-Capillary 204 (H) 70 - 99 mg/dL  Glucose, capillary     Status: Abnormal   Collection Time: 02/24/19  6:03 AM  Result Value Ref Range   Glucose-Capillary 182 (H)  70 - 99 mg/dL    Imaging:  MRI BRAIN WO: IMPRESSION: Acute infarction in the left basal ganglia and radiating white matter tracts consistent with lenticulostriate vessel insult. No significant swelling. No visible hemorrhage.  Old small vessel ischemic changes  elsewhere throughout the brain as outlined above.  Assessment/Plan:  Christian [name] is a 48 year old male w/ a PMHx of HTN, hyperlipidemia, DM, obesity, CAD, alcohol use disorder and severe depression with suicidal ideation presenting with progressive right-sided weakness and facial droop for to have an acute left basil ganglia infarct.  Plan by Problem:  Active Problems:   Acute CVA (cerebrovascular accident) ([place]) -presented w/ 4 day hx right sided weakness and facial droop concerning for stroke found to have acute infarct in the left basal ganglia and radiating white matter tracts consistent with lenticulostriate vessel insult -pt out of window for TPA or permissive htn -no significant stenoses of the head and neck on CTA -undergoing regular stroke work up -Echo scheduled for today -Lipid panel shows elevated triglycerides to almost 400, total cholesterol of 191 LDL of 68 and VLDL of 79; 2 weeks prior his total cholesterol was 243, triglycerides 234, VLDL 47 and LDL 141  -HIV nonreactive -Hemoglobin A1c 8.7 -Based on conversation with patient today, unclear if patient takes medications for blood pressure, diabetes and hyperlipidemia; sounds as if he was given samples after hospitalization last month for suicidal ideation and may have taken them for around a week -It does not sound as if he has a primary care physician secondary to lack of insurance and not liking going to the doctor  Plan: -medical optimization with aspirin 325 daily, atorvastatin 80 daily, tight glucose control -evaluation by speech/PT/OT -fu echo -Consult case management for assistance with health insurance, obtaining a PCP and obtaining affordable medications  AKI: -baseline creatinine of 0.8 with increase up to 1.5 likely in setting of poor po during subacute stroke -may have been slightly dehydrated after multiple episodes of diarrhea yesterday -Received fluids yesterday  Plan: -Follow-up BMP  HTN: -out of  window for permissive hypertension -Patient on carvedilol and losartan at home, unclear if patient was taking these -BP 154/94 most recently on carvedilol 25 twice daily  Plan: -continue carvedilol -Consider restarting losartan pending BMP  Hyperlipidemia: -previously prescribed atorvastatin 10 mg, although unclear patient is taking this -Lipid panel shows elevated triglycerides to almost 400, total cholesterol of 191 LDL of 68 and VLDL of 79; 2 weeks prior his total cholesterol was 243, triglycerides 234, VLDL 47 and LDL 141  -increased statin dose to 80 daily   Plan: -Zetia added for triglycerides  DM: -Prescribed glipizide 10 mg BID with meals, metofrmin 1000 mg BID with meals and lantus 10 units before bed at home, unclear if patient was taking these -[facility] here -Diabetes counselor consulted  Severe depression w/ suicidal ideation: -pt denies mood changes -prescribed mirtazapine, gabapentin and trazodone, unclear if he was taking these -continue mirtazapine 30 mg nightly, gabapentin 200 mg TID  -holding trazodone   Dispo: Anticipated discharge pending clinical course.  Al Decant, MD 02/24/2019, 6:15 AM Pager: [phone]

## 2019-02-24 NOTE — Progress Notes (Signed)
Occupational Therapy Evaluation Patient Details Name: Christian [name] MRN: [id number] DOB: 1970/08/30 Today's Date: 02/24/2019    History of Present Illness Patient is a 48 year old male admitted with reported right side weakness, starting in LE and moving up to R UE. MRI shows small acute infarct in L basal ganglia. PMH includes: HTN, DM, HLD, obesity, CAD, depression with suicidal ideation   Clinical Impression   PTA, pt was living at home with his mom, who he was assisting physically, pt reports he was independent with ADL/IADL and functional mobiltiy. Pt currently requires mingaurd-minA with functional mobility during ADL. He required mingaurd-minA at times while sitting EOB for LB dressing. Pt with intermittent right lateral lean, able to self correct. Due to decline in current level of function, pt would benefit from acute OT to address established goals to facilitate safe D/C to venue listed below. At this time, recommend CIR follow-up. Will continue to follow acutely.     Follow Up Recommendations  CIR    Equipment Recommendations  None recommended by OT    Recommendations for Other Services       Precautions / Restrictions Precautions Precautions: Fall Restrictions Weight Bearing Restrictions: No      Mobility Bed Mobility Overal bed mobility: Needs Assistance Bed Mobility: Supine to Sit     Supine to sit: Min guard        Transfers Overall transfer level: Needs assistance Equipment used: None Transfers: Sit to/from Stand Sit to Stand: Min assist         General transfer comment: minA for powerup    Balance Overall balance assessment: Needs assistance Sitting-balance support: Feet supported Sitting balance-Leahy Scale: Fair Sitting balance - Comments: instability sitting EOB, intermittent right lateral lean, pt able to self correct Postural control: Right lateral lean Standing balance support: No upper extremity supported;During functional  activity Standing balance-Leahy Scale: Fair Standing balance comment: no LOB however decreased attention to right side                           ADL either performed or assessed with clinical judgement   ADL Overall ADL's : Needs assistance/impaired Eating/Feeding: Set up;Sitting Eating/Feeding Details (indicate cue type and reason): reports difficulty with manipulating utensils;would benefit from built up grip  Grooming: Min guard;Minimal assistance;Standing   Upper Body Bathing: Min guard;Sitting   Lower Body Bathing: Min guard;Minimal assistance;Sit to/from stand   Upper Body Dressing : Minimal assistance;Sitting   Lower Body Dressing: Minimal assistance;Sit to/from stand Lower Body Dressing Details (indicate cue type and reason): doffed/donned socks sitting EOB right lateral lean noted at times  Toilet Transfer: Minimal assistance;Ambulation Toilet Transfer Details (indicate cue type and reason): simulated Toileting- Clothing Manipulation and Hygiene: Minimal assistance;Sit to/from stand       Functional mobility during ADLs: Minimal assistance General ADL Comments: limited by decreased activity tolerance, decreased functional use of RUE, decreased stability      Vision Baseline Vision/History: Wears glasses Wears Glasses: Reading only Patient Visual Report: No change from baseline Vision Assessment?: Yes Eye Alignment: Within Functional Limits Ocular Range of Motion: Restricted on the left;Restricted looking up Alignment/Gaze Preference: Within Defined Limits Tracking/Visual Pursuits: Decreased smoothness of horizontal tracking;Requires cues, head turns, or add eye shifts to track;Unable to hold eye position out of midline Saccades: Undershoots;Decreased speed of saccadic movement Convergence: Impaired (comment)     Perception     Praxis      Pertinent Vitals/Pain Pain Assessment: No/denies  pain     Hand Dominance Right   Extremity/Trunk Assessment  Upper Extremity Assessment Upper Extremity Assessment: RUE deficits/detail RUE Deficits / Details: 3-/5 grossly;shoulder flexion AROM 150*;decreased speed with rapid alternating movements;reports difficulty holding utensils RUE Sensation: WNL RUE Coordination: decreased gross motor;decreased fine motor   Lower Extremity Assessment Lower Extremity Assessment: Defer to PT evaluation RLE Deficits / Details: 3+/5 generally throughout. RLE Sensation: WNL RLE Coordination: decreased gross motor;decreased fine motor   Cervical / Trunk Assessment Cervical / Trunk Assessment: Normal   Communication Communication Communication: Expressive difficulties   Cognition Arousal/Alertness: Awake/alert Behavior During Therapy: WFL for tasks assessed/performed Overall Cognitive Status: Within Functional Limits for tasks assessed                                     General Comments  vss;pt verbalized he would not get up alone, no chair alarms present on unit    Exercises     Shoulder Instructions      Home Living Family/patient expects to be discharged to:: Private residence Living Arrangements: Parent Available Help at Discharge: Available PRN/intermittently;Family Type of Home: House Home Access: Stairs to enter Technical brewer of Steps: 5 Entrance Stairs-Rails: Left Home Layout: One level     Bathroom Shower/Tub: Teacher, early years/pre: Standard Bathroom Accessibility: No   Home Equipment: Kasandra Knudsen - single point      Lives With: Family    Prior Functioning/Environment Level of Independence: Independent        Comments: pt was assisting his mother with early onset dementia;sister was managing pt's mom's medication and finances, pt was assisting mom physically and with home management        OT Problem List: Decreased strength;Decreased range of motion;Decreased activity tolerance;Impaired balance (sitting and/or standing);Decreased safety  awareness;Impaired UE functional use;Decreased coordination;Impaired vision/perception      OT Treatment/Interventions: Self-care/ADL training;Energy conservation;Therapeutic exercise;Therapeutic activities;Visual/perceptual remediation/compensation;Patient/family education;Balance training    OT Goals(Current goals can be found in the care plan section) Acute Rehab OT Goals Patient Stated Goal: to get back to baseline OT Goal Formulation: With patient Time For Goal Achievement: 03/10/19 Potential to Achieve Goals: Good ADL Goals Pt Will Perform Grooming: Independently Pt Will Perform Lower Body Dressing: Independently Pt Will Transfer to Toilet: Independently Pt Will Perform Tub/Shower Transfer: Independently  OT Frequency: Min 2X/week   Barriers to D/C: Decreased caregiver support  pt lives with his mom and assists her physically       Co-evaluation PT/OT/SLP Co-Evaluation/Treatment: Yes Reason for Co-Treatment: For patient/therapist safety;To address functional/ADL transfers PT goals addressed during session: Mobility/safety with mobility;Balance OT goals addressed during session: ADL's and self-care      AM-PAC OT "6 Clicks" Daily Activity     Outcome Measure Help from another person eating meals?: A Little Help from another person taking care of personal grooming?: A Little Help from another person toileting, which includes using toliet, bedpan, or urinal?: A Little Help from another person bathing (including washing, rinsing, drying)?: A Little Help from another person to put on and taking off regular upper body clothing?: A Little Help from another person to put on and taking off regular lower body clothing?: A Little 6 Click Score: 18   End of Session Equipment Utilized During Treatment: Gait belt Nurse Communication: Mobility status  Activity Tolerance: Patient tolerated treatment well Patient left: in chair;with call bell/phone within reach  OT Visit Diagnosis:  Unsteadiness  on feet (R26.81);Other abnormalities of gait and mobility (R26.89);Muscle weakness (generalized) (M62.81)                Time: [id number] OT Time Calculation (min): 23 min Charges:  OT General Charges $OT Visit: 1 Visit OT Evaluation $OT Eval Moderate Complexity: [place] OTR/L Acute Rehabilitation Services Office: [place] 02/24/2019, 1:01 PM

## 2019-02-24 NOTE — Plan of Care (Signed)
  Problem: Self-Care: Goal: Verbalization of feelings and concerns over difficulty with self-care will improve Outcome: Progressing   

## 2019-02-24 NOTE — Progress Notes (Signed)
Rehab Admissions Coordinator Note:  Patient was screened by Cleatrice Burke for appropriateness for an Inpatient Acute Rehab Consult per PT initial eval. I would like to see how pt progresses with therapy over th next 24 hrs and await OT eval. Patient likely to progress to outpatient level and not need an intensive inpt rehab admit. I will follow.  Cleatrice Burke RN MSN 02/24/2019, 11:48 AM  I can be reached at [phone].

## 2019-02-24 NOTE — Evaluation (Signed)
Speech Language Pathology Evaluation Patient Details Name: Christian [name] MRN: [id number] DOB: 07/14/1970 Today's Date: 02/24/2019 Time: 0920-1005 SLP Time Calculation (min) (ACUTE ONLY): 45 min  Problem List:  Patient Active Problem List   Diagnosis Date Noted  . Acute CVA (cerebrovascular accident) ([place]) 02/23/2019  . Major depression 02/05/2019  . Severe episode of recurrent major depressive disorder, without psychotic features ([place]) 05/29/2018  . Suicidal ideation 05/29/2018  . Alcohol use disorder, moderate, dependence ([place]) 05/29/2018  . Hyperosmolar non-ketotic state in patient with type 2 diabetes mellitus ([place]) 05/08/2018  . Chest pain 12/30/2017  . CAD (coronary artery disease)   . HTN (hypertension)   . Hyperlipidemia   . Diabetes mellitus, type II ([place])   . Morbid obesity ([place])   . History of tobacco abuse    Past Medical History:  Past Medical History:  Diagnosis Date  . CAD (coronary artery disease)    a. 07/2013 NSTEMI/PCI: LCX 7m (4.0x23 Xience DES), RCA 93m, EF > 55%;  b. 07/2013 Echo: EF 60-65%, diast dysfxn, mild LVH, mildly dil LA, nl RV size/fxn, nl RVSP.  Marland Kitchen History of tobacco abuse    a. quit ~ 2010  . Hyperlipidemia   . Morbid obesity ([place])    a. weighed 168 @ age 43.  Marland Kitchen Scoliosis    Past Surgical History:  Past Surgical History:  Procedure Laterality Date  . ADENOIDECTOMY    . CARDIAC CATHETERIZATION  07/2013   [facility]'x1 stent   HPI:  pt is a 48 yo male adm to [facility] with right sided weakness, dysarthria occuring over several days - Diagnosed with right basal ganglia CVA.  Pt with PMH + for dyslexia, stuttering, ETOH use - significant over the lat 4-5 years per pt, depression, scoliosis, suicidal ideation, HTN, and obesity.  He passed a Yale swallow screen, speech eval ordered.   Assessment / Plan / Recommendation Clinical Impression  MOCA administered to pt resulting in score of 21/30 indicative of a mild cognitive deficit = difficulties most notably in  areas of expressive fluency, serial subtraction = those items requiring higher level of cognitive organization. He admits to test being more difficult than it would be at baseline.  Expressive language deficits noted with word finding and stuttering results in decreased fluency.  In addition, pt is dysarthria with imprecise articulation of consonant clusters for which he is intentionally slowing his pace. Facial and hypoglossal nerve impairment present impacting his labial seal and lingual coordination.  Given pt is his mother's caregiver, his life requires high level of cognition, he will benefit from skilled SlP to maximize his rehab to return to as close to baseline function as possible.    SLP Assessment  SLP Recommendation/Assessment: Patient needs continued Speech Lanaguage Pathology Services SLP Visit Diagnosis: Attention and concentration deficit;Dysarthria and anarthria (R47.1) Attention and concentration deficit following: Other cerebrovascular disease    Follow Up Recommendations  Outpatient SLP    Frequency and Duration min 2x/week  2 weeks      SLP Evaluation Cognition  Overall Cognitive Status: Within Functional Limits for tasks assessed Orientation Level: Oriented X4 Attention: Sustained Sustained Attention: Appears intact Sustained Attention Impairment: Verbal basic;Verbal complex Memory: Impaired(recalled 3/5 words independently) Memory Impairment: Retrieval deficit Awareness: Appears intact(aware of need to call for help due to fall risk) Problem Solving: Impaired Problem Solving Impairment: (verbal and written subtraction impaired) Safety/Judgment: Impaired(denies benefit of help with his medication management, etc)       Comprehension  Auditory Comprehension Yes/No Questions: Not  tested Commands: Within Functional Limits Conversation: Complex Visual Recognition/Discrimination Discrimination: Not tested Reading Comprehension Reading Status: Not tested     Expression Expression Primary Mode of Expression: Verbal Verbal Expression Initiation: (initial sound repetition intermittently observed) Repetition: Impaired Level of Impairment: Sentence level(pt with incorrect order of 2 nouns in the sentence, he attributes to dyslexia) Naming: No impairment Pragmatics: No impairment Non-Verbal Means of Communication: Not applicable Written Expression Dominant Hand: Right Written Expression: Not tested   Oral / Motor  Motor Speech Overall Motor Speech: Impaired Respiration: Within functional limits Resonance: Within functional limits Articulation: Impaired Level of Impairment: Word Intelligibility: Intelligibility reduced(consonant clusters difficult, etc) Word: 50-74% accurate Phrase: 75-100% accurate Sentence: 75-100% accurate Conversation: 75-100% accurate Interfering Components: Premorbid status(premorbid stuttering as a child) Effective Techniques: Slow rate   GO                    Christian [name] 02/24/2019, 10:29 AM   Luanna Salk, MS [place] Pager [phone] Office [phone]

## 2019-02-25 ENCOUNTER — Telehealth: Payer: Self-pay | Admitting: General Practice

## 2019-02-25 DIAGNOSIS — Z7902 Long term (current) use of antithrombotics/antiplatelets: Secondary | ICD-10-CM

## 2019-02-25 DIAGNOSIS — R471 Dysarthria and anarthria: Secondary | ICD-10-CM

## 2019-02-25 DIAGNOSIS — E781 Pure hyperglyceridemia: Secondary | ICD-10-CM

## 2019-02-25 DIAGNOSIS — Z7984 Long term (current) use of oral hypoglycemic drugs: Secondary | ICD-10-CM

## 2019-02-25 DIAGNOSIS — E7800 Pure hypercholesterolemia, unspecified: Secondary | ICD-10-CM

## 2019-02-25 DIAGNOSIS — Z7982 Long term (current) use of aspirin: Secondary | ICD-10-CM

## 2019-02-25 LAB — BASIC METABOLIC PANEL
Anion gap: 9 (ref 5–15)
BUN: 10 mg/dL (ref 6–20)
CO2: 29 mmol/L (ref 22–32)
Calcium: 8.8 mg/dL — ABNORMAL LOW (ref 8.9–10.3)
Chloride: 101 mmol/L (ref 98–111)
Creatinine, Ser: 0.97 mg/dL (ref 0.61–1.24)
GFR calc Af Amer: >60 mL/min (ref >60)
GFR calc non Af Amer: >60 mL/min (ref >60)
Glucose, Bld: 188 mg/dL — ABNORMAL HIGH (ref 70–99)
Potassium: 4 mmol/L (ref 3.5–5.1)
Sodium: 139 mmol/L (ref 135–145)

## 2019-02-25 LAB — GLUCOSE, CAPILLARY
Glucose-Capillary: 163 mg/dL — ABNORMAL HIGH (ref 70–99)
Glucose-Capillary: 196 mg/dL — ABNORMAL HIGH (ref 70–99)

## 2019-02-25 LAB — CBC
HCT: 35.4 % — ABNORMAL LOW (ref 39.0–52.0)
Hemoglobin: 12.3 g/dL — ABNORMAL LOW (ref 13.0–17.0)
MCH: 32 pg (ref 26.0–34.0)
MCHC: 34.7 g/dL (ref 30.0–36.0)
MCV: 92.2 fL (ref 80.0–100.0)
Platelets: 160 10*3/uL (ref 150–400)
RBC: 3.84 MIL/uL — ABNORMAL LOW (ref 4.22–5.81)
RDW: 12.2 % (ref 11.5–15.5)
WBC: 4.7 10*3/uL (ref 4.0–10.5)
nRBC: 0 % (ref 0.0–0.2)

## 2019-02-25 MED ORDER — CANAGLIFLOZIN 100 MG PO TABS: 100.0000 mg | ORAL_TABLET | Freq: Every day | ORAL | 0 refills | Status: DC

## 2019-02-25 MED ORDER — CLOPIDOGREL BISULFATE 75 MG PO TABS: 75.0000 mg | ORAL_TABLET | Freq: Every day | ORAL | 0 refills | Status: DC

## 2019-02-25 MED ORDER — ATORVASTATIN CALCIUM 80 MG PO TABS: 80.0000 mg | ORAL_TABLET | Freq: Every day | ORAL | 0 refills | Status: DC

## 2019-02-25 NOTE — Plan of Care (Signed)
Nutrition Education Note  RD consulted for nutrition education regarding a Heart Healthy/Carbohydrate Modified diet.   Lipid Panel     Component Value Date/Time   CHOL 191 02/23/2019 1350   CHOL 180 09/30/2013 1447   CHOL 160 08/11/2013 0340   TRIG 393 (H) 02/23/2019 1350   TRIG 213 (H) 08/11/2013 0340   HDL 44 02/23/2019 1350   HDL 35 (L) 09/30/2013 1447   HDL 25 (L) 08/11/2013 0340   CHOLHDL 4.3 02/23/2019 1350   VLDL 79 (H) 02/23/2019 1350   VLDL 43 (H) 08/11/2013 0340   LDLCALC 68 02/23/2019 1350   LDLCALC 117 (H) 09/30/2013 1447   LDLCALC 92 08/11/2013 0340    RD provided "Heart Healthy Nutrition Therapy" handout from the Academy of Nutrition and Dietetics. Reviewed patient's dietary recall. Provided examples on ways to decrease sodium and fat intake in diet. Discouraged intake of processed foods and use of salt shaker. Encouraged fresh fruits and vegetables as well as whole grain sources of carbohydrates to maximize fiber intake. Discussed diabetic drink options. Teach back method used.  Expect good compliance.  Body mass index is 26.58 kg/m. Pt meets criteria for overweight based on current BMI.  Current diet order is heart healthy/carbohydrate modified, patient is consuming approximately 100% of meals at this time. Labs and medications reviewed. No further nutrition interventions warranted at this time. RD contact information provided. If additional nutrition issues arise, please re-consult RD.  Corrin Parker, MS, RD, LDN Pager # [phone] After hours/ weekend pager # [phone]

## 2019-02-25 NOTE — Progress Notes (Signed)
Inpatient Rehabilitation Admissions Coordinator  I met with patient at bedside to discuss rehab venue options. Noted by therapist today that he is now 300 feet supervision without AD. I clarified with Dr. Naaman Plummer and pt no longer in need of an inpt rehab admit at this level. We recommend home with outpatient or HH if transportation not available. I have contacted attending service and we will sign off. RN CM and SW made aware.  Danne Baxter, RN, MSN Rehab Admissions Coordinator [phone] 02/25/2019 10:39 AM

## 2019-02-25 NOTE — Progress Notes (Signed)
Physical Therapy Treatment Patient Details Name: Christian [name] MRN: [id number] DOB: 05/27/1970 Today's Date: 02/25/2019    History of Present Illness Patient is a 48 year old male admitted with reported right side weakness, starting in LE and moving up to R UE. MRI shows small acute infarct in L basal ganglia. PMH includes: HTN, DM, HLD, obesity, CAD, depression with suicidal ideation    PT Comments    Patient received in bed, reports it took him a while to eat breakfast due to right UE weakness. Patient agrees to PT. Requires increased time/effort to exit bed on his right side. Transfers with supervision. He is able to progress ambulation to 300 feet this day and up/down 3 steps with rail and supervision. Performed standing LE exercises with supervision. Patient will continue to benefit from skilled PT while here to improve strength in R LE and increase independence.     Follow Up Recommendations  Outpatient PT;Home health PT(depending on transportation)     Equipment Recommendations  None recommended by PT    Recommendations for Other Services       Precautions / Restrictions Precautions Precautions: Fall Precaution Comments: low fall Restrictions Weight Bearing Restrictions: No    Mobility  Bed Mobility Overal bed mobility: Modified Independent Bed Mobility: Supine to Sit;Sit to Supine     Supine to sit: Modified independent (Device/Increase time)     General bed mobility comments: use of rail, increased time  Transfers Overall transfer level: Needs assistance Equipment used: None Transfers: Sit to/from Stand Sit to Stand: Supervision            Ambulation/Gait Ambulation/Gait assistance: Supervision Gait Distance (Feet): 300 Feet Assistive device: None Gait Pattern/deviations: Step-through pattern;Decreased stride length Gait velocity: WNL   General Gait Details: improved step length today and increased arm swing, head turning   Stairs Stairs:  Yes Stairs assistance: Supervision Stair Management: Two rails;Step to pattern Number of Stairs: 3     Wheelchair Mobility    Modified Rankin (Stroke Patients Only) Modified Rankin (Stroke Patients Only) Pre-Morbid Rankin Score: No symptoms Modified Rankin: Moderate disability     Balance Overall balance assessment: Needs assistance Sitting-balance support: Feet supported Sitting balance-Leahy Scale: Good     Standing balance support: No upper extremity supported Standing balance-Leahy Scale: Good Standing balance comment: continues to demonstrate decreased awareness of right side, nearly bumping into objects in hall.                            Cognition Arousal/Alertness: Awake/alert Behavior During Therapy: WFL for tasks assessed/performed Overall Cognitive Status: Within Functional Limits for tasks assessed                                        Exercises Other Exercises Other Exercises: Standing LE exercises: heel raises, hip abduction, marching x 10 reps bilaterally. UE assist on counter for support. STS x 5 reps    General Comments        Pertinent Vitals/Pain Pain Assessment: No/denies pain    Home Living                      Prior Function            PT Goals (current goals can now be found in the care plan section) Acute Rehab PT Goals Patient Stated Goal: to  get back to baseline Progress towards PT goals: Progressing toward goals    Frequency    Min 4X/week      PT Plan Discharge plan needs to be updated    Co-evaluation              AM-PAC PT "6 Clicks" Mobility   Outcome Measure  Help needed turning from your back to your side while in a flat bed without using bedrails?: None Help needed moving from lying on your back to sitting on the side of a flat bed without using bedrails?: A Little Help needed moving to and from a bed to a chair (including a wheelchair)?: A Little Help needed standing  up from a chair using your arms (e.g., wheelchair or bedside chair)?: None Help needed to walk in hospital room?: A Little Help needed climbing 3-5 steps with a railing? : A Little 6 Click Score: 20    End of Session Equipment Utilized During Treatment: Gait belt Activity Tolerance: Patient tolerated treatment well Patient left: in bed;with call bell/phone within reach;with bed alarm set Nurse Communication: Mobility status PT Visit Diagnosis: Muscle weakness (generalized) (M62.81);Hemiplegia and hemiparesis Hemiplegia - Right/Left: Right Hemiplegia - dominant/non-dominant: Dominant Hemiplegia - caused by: Cerebral infarction     Time: 0915-0930 PT Time Calculation (min) (ACUTE ONLY): 15 min  Charges:  $Therapeutic Exercise: 8-22 mins                     Raeleigh Guinn, PT, GCS 02/25/19,9:44 AM

## 2019-02-25 NOTE — TOC Transition Note (Signed)
Transition of Care [facility]) - CM/SW Discharge Note   Patient Details  Name: Christian [name] MRN: [id number] Date of Birth: May 28, 1970  [facility] CM/SW Contact:  Pollie Friar, RN Phone Number: 02/25/2019, 12:31 PM   Clinical Narrative:    Pt discharging home with [facility] services through [facility] ([facility] services). They do not have ST in the [place] area but will see him for PT/OT.  Cm provided him Good Rx card for his d/c meds. Per his sister he can afford the meds with the card.  Sister to provide transport home.    Final next level of care: [place] Barriers to Discharge: Inadequate or no insurance, Barriers Unresolved (comment)   Patient Goals and CMS Choice        Discharge Placement                       Discharge Plan and Services   Discharge Planning Services: CM Consult Post Acute Care Choice: IP Rehab                    HH Arranged: PT, OT [place] Agency: [place] ([facility]) Date HH Agency Contacted: 02/25/19   Representative spoke with at [place]: Donna---[facility] [place] services  Social Determinants of Health ([place]) Interventions     Readmission Risk Interventions No flowsheet data found.

## 2019-02-25 NOTE — Progress Notes (Signed)
NURSING PROGRESS NOTE  Christian [name] [id number] Discharge Data: 02/25/2019 2:22 PM Attending Provider: Bartholomew Crews, MD [id number], No Pcp Per     Christian [name] to be D/C'd Home per MD order.  Discussed with the patient the After Visit Summary and all questions fully answered. All IV's discontinued with no bleeding noted. All belongings returned to patient for patient to take home.   Last Vital Signs:  Blood pressure 127/89, pulse 74, temperature 98.5 F (36.9 C), temperature source Oral, resp. rate 18, height 5\' 6"  (1.676 m), weight 74.7 kg, SpO2 99 %.  Discharge Medication List Allergies as of 02/25/2019   No Known Allergies     Medication List    STOP taking these medications   glipiZIDE 10 MG tablet Commonly known as: GLUCOTROL   insulin glargine 100 UNIT/ML injection Commonly known as: LANTUS     TAKE these medications   aspirin 81 MG chewable tablet Chew 1 tablet (81 mg total) by mouth daily. Notes to patient: 02/26/2019   atorvastatin 80 MG tablet Commonly known as: LIPITOR Take 1 tablet (80 mg total) by mouth daily at 6 PM. What changed:   medication strength  how much to take Notes to patient: 02/25/2019   canagliflozin 100 MG Tabs tablet Commonly known as: Invokana Take 1 tablet (100 mg total) by mouth daily before breakfast. Notes to patient: 02/26/2019   carvedilol 25 MG tablet Commonly known as: COREG Take 1 tablet (25 mg total) by mouth 2 (two) times daily with a meal. Notes to patient: 02/25/2019   clopidogrel 75 MG tablet Commonly known as: PLAVIX Take 1 tablet (75 mg total) by mouth daily. Start taking on: February 26, 2019   gabapentin 100 MG capsule Commonly known as: NEURONTIN Take 2 capsules (200 mg total) by mouth 3 (three) times daily. Notes to patient: 02/25/2019   losartan 100 MG tablet Commonly known as: COZAAR Take 1 tablet (100 mg total) by mouth daily. Notes to patient: 02/25/2019   metFORMIN 1000 MG tablet Commonly known  as: GLUCOPHAGE Take 1 tablet (1,000 mg total) by mouth 2 (two) times daily with a meal. Notes to patient: 02/25/2019   mirtazapine 30 MG tablet Commonly known as: REMERON Take 1 tablet (30 mg total) by mouth at bedtime. Notes to patient: 02/25/2019   traZODone 50 MG tablet Commonly known as: DESYREL Take 1 tablet (50 mg total) by mouth at bedtime as needed for sleep. What changed: when to take this

## 2019-02-25 NOTE — Progress Notes (Signed)
Subjective: HD#2 Overnight, no acute events reported. This morning, [name] was evaluated at bedside. He mentions that this morning he feels well and was able to walk around with PT without difficulty. He mentions having continued difficulty with fine motor skills and speech but feels improved compared to yesterday.   Objective:  Vital signs in last 24 hours: Vitals:   02/24/19 1530 02/24/19 1944 02/24/19 2334 02/25/19 0345  BP: 134/87 (!) 150/94 (!) 146/91 140/84  Pulse: 70 80 77 69  Resp: 18 18 18 18   Temp: 98.7 F (37.1 C) 98.9 F (37.2 C) 98.6 F (37 C) 99.7 F (37.6 C)  TempSrc: Oral Oral Oral Oral  SpO2: 97% 95% 96% 96%  Weight:      Height:       Physical Exam  Constitutional: He is oriented to person, place, and time. No distress.  Musculoskeletal: Normal range of motion.  Neurological: He is alert and oriented to person, place, and time.  Continues to have mildly dysarthric speech but still intelligible; spontaneously moving all extremities without any obvious deficits noted   Skin: Skin is warm and dry. He is not diaphoretic.  Psychiatric: Mood, affect and judgment normal.  Nursing note and vitals reviewed.  I/Os:  Intake/Output Summary (Last 24 hours) at 02/25/2019 0631 Last data filed at 02/24/2019 2340 Gross per 24 hour  Intake 720 ml  Output 650 ml  Net 70 ml   Labs: Results for orders placed or performed during the hospital encounter of 02/23/19 (from the past 24 hour(s))  Basic metabolic panel     Status: Abnormal   Collection Time: 02/24/19 12:05 PM  Result Value Ref Range   Sodium 141 135 - 145 mmol/L   Potassium 4.1 3.5 - 5.1 mmol/L   Chloride 102 98 - 111 mmol/L   CO2 28 22 - 32 mmol/L   Glucose, Bld 206 (H) 70 - 99 mg/dL   BUN 10 6 - 20 mg/dL   Creatinine, Ser 1.05 0.61 - 1.24 mg/dL   Calcium 9.0 8.9 - 10.3 mg/dL   GFR calc non Af Amer >60 >60 mL/min   GFR calc Af Amer >60 >60 mL/min   Anion gap 11 5 - 15  Urine rapid drug screen  (hosp performed)not at [facility]     Status: None   Collection Time: 02/24/19 12:26 PM  Result Value Ref Range   Opiates NONE DETECTED NONE DETECTED   Cocaine NONE DETECTED NONE DETECTED   Benzodiazepines NONE DETECTED NONE DETECTED   Amphetamines NONE DETECTED NONE DETECTED   Tetrahydrocannabinol NONE DETECTED NONE DETECTED   Barbiturates NONE DETECTED NONE DETECTED  Glucose, capillary     Status: Abnormal   Collection Time: 02/24/19 12:26 PM  Result Value Ref Range   Glucose-Capillary 204 (H) 70 - 99 mg/dL  Glucose, capillary     Status: Abnormal   Collection Time: 02/24/19  4:38 PM  Result Value Ref Range   Glucose-Capillary 179 (H) 70 - 99 mg/dL   Comment 1 Notify RN    Comment 2 Document in Chart   Glucose, capillary     Status: Abnormal   Collection Time: 02/24/19  9:18 PM  Result Value Ref Range   Glucose-Capillary 162 (H) 70 - 99 mg/dL  Glucose, capillary     Status: Abnormal   Collection Time: 02/25/19  5:55 AM  Result Value Ref Range   Glucose-Capillary 163 (H) 70 - 99 mg/dL    Imaging:  ECHO:  IMPRESSIONS  1.  Left ventricular ejection fraction, by visual estimation, is 65 to 70%. The left ventricle has hyperdynamic function. There is moderately increased left ventricular hypertrophy.  2. Global right ventricle has normal systolic function.The right ventricular size is normal.  3. Left atrial size was mildly dilated.  4. Right atrial size was normal.  5. The mitral valve is normal in structure. No evidence of mitral valve regurgitation.  6. The tricuspid valve is normal in structure. Tricuspid valve regurgitation is trivial.  7. The aortic valve is tricuspid. Aortic valve regurgitation is not visualized. No evidence of aortic valve stenosis.  8. The pulmonic valve was not well visualized. Pulmonic valve regurgitation is not visualized.  9. There is mild dilatation of the ascending aorta measuring 37 mm. 10. The inferior vena cava is normal in size with greater than 50%  respiratory variability, suggesting right atrial pressure of 3 mmHg. 11. The interatrial septum was not well visualized.  Assessment/Plan: [name] is a 48 year old male w/ a PMHx of HTN, hyperlipidemia, DM, obesity, CAD, alcohol use disorder and severe depression with suicidal ideation presenting with progressive right-sided weakness and facial droop for to have an acute left basil ganglia infarct.  Acute CVA (cerebrovascular accident) [facility]) [name] presented w/ 4 day hx right sided weakness and facial droop concerning for stroke found to have acute infarct in the left basal ganglia and radiating white matter tracts consistent with lenticulostriate vessel insult. He was out of the window for TPA or permissive hypertension. No significant stenosis of head or neck vessels noted on CTA. Echo with LVEF 65-70% without any structural defects noted.  Lipid panel with hypercholesterolemia and hypertriglyceridemia. At this time, will increase his atorvastatin to 80mg  and if still has uncontrolled hypertriglyceridemia, will add on fenofibrate or omega-3's. Will hold off on adding zetia on discharge.  Patient initially recommended for CIR but on re-evaluation and due to improvement in his strength, he is recommended for home health PT.  - Aspirin 81mg  + Plavix 75mg  daily  - Atorvastatin 80mg  daily - Resume home DM medications and f/u with PCP - CM to assist with health insurance and home health PT/OT/SLP  - Patient stable for discharge today  AKI: improved Patient's baseline sCr 0.8. On admission, sCr 1.2, likely pre-renal. This improved with fluid resuscitation and sCr this AM is 0.97.  -Follow-up BMP  HTN: SBP 140s. Home medications include carvedilol and losartan; however questionable medication compliance.  -Continue carvedilol 25mg  bid  Hyperlipidemia: Lipid panel shows elevated triglycerides to almost 400, total cholesterol of 191 LDL of 68 and VLDL of 79; 2 weeks prior his total cholesterol  was 243, triglycerides 234, VLDL 47 and LDL 141  - Atorvastatin 80mg  qd  DM: HbA1c 8.7. Prescribed glipizide 10 mg BID with meals, metofrmin 1000 mg BID with meals and lantus 10 units before bed at home, unclear if patient was taking these. Patient on SSI with Lantus 7U qHS. CBG's in 160-180s. Patient would benefit from tight glycemic controls in setting of recent acute CVA. He needs a PCP to follow up on his medication regimen.   Severe depression w/ suicidal ideation: Patient appears to be in good spirits this morning. He reports he has outpatient follow up set for his mental health issues.   Dispo: Anticipated discharge today.   Harvie Heck, MD  Internal Medicine PGY-1 02/25/2019, 6:31 AM Pager: [phone]

## 2019-02-25 NOTE — Discharge Summary (Signed)
Name: Christian [name] MRN: [id number] DOB: 01/31/1971 48 y.o. PCP: Patient, No Pcp Per  Date of Admission: 02/23/2019 11:26 AM Date of Discharge:  02/25/2019 Attending Physician: Christian Crews, MD  Discharge Diagnosis: 1. Acute left basal ganglia stroke 2. Acute kidney injury 3. Hypertension 4. Diabetes mellitus   Discharge Medications: Allergies as of 02/25/2019   No Known Allergies     Medication List    STOP taking these medications   glipiZIDE 10 MG tablet Commonly known as: GLUCOTROL   insulin glargine 100 UNIT/ML injection Commonly known as: LANTUS     TAKE these medications   aspirin 81 MG chewable tablet Chew 1 tablet (81 mg total) by mouth daily.   atorvastatin 80 MG tablet Commonly known as: LIPITOR Take 1 tablet (80 mg total) by mouth daily at 6 PM. What changed:   medication strength  how much to take   canagliflozin 100 MG Tabs tablet Commonly known as: Invokana Take 1 tablet (100 mg total) by mouth daily before breakfast.   carvedilol 25 MG tablet Commonly known as: COREG Take 1 tablet (25 mg total) by mouth 2 (two) times daily with a meal.   clopidogrel 75 MG tablet Commonly known as: PLAVIX Take 1 tablet (75 mg total) by mouth daily. Start taking on: February 26, 2019   gabapentin 100 MG capsule Commonly known as: NEURONTIN Take 2 capsules (200 mg total) by mouth 3 (three) times daily.   losartan 100 MG tablet Commonly known as: COZAAR Take 1 tablet (100 mg total) by mouth daily.   metFORMIN 1000 MG tablet Commonly known as: GLUCOPHAGE Take 1 tablet (1,000 mg total) by mouth 2 (two) times daily with a meal.   mirtazapine 30 MG tablet Commonly known as: REMERON Take 1 tablet (30 mg total) by mouth at bedtime.   traZODone 50 MG tablet Commonly known as: DESYREL Take 1 tablet (50 mg total) by mouth at bedtime as needed for sleep. What changed: when to take this       Disposition and follow-up:   [name] was  discharged from [facility] in Stable condition.  At the hospital follow up visit please address:  1. Acute left basal ganglia stroke:  Patient presented with 4 day history of right sided weakness and facial stroke deemed to have acute basal ganglia stroke 2/2 chronic microvascular disease. Stroke work up complete. Pt discharged on DAPT x3 wks, high intensity statin, carvedilol and losartan for HTN, and metformin and invokana for DM control. Please make sure patient is taking as prescribed.   AKI:  Patient presented with elevated sCr 1.5 (baseline 0.7), downtrending with fluids on discharge. sCr 0.97 prior to discharge. Please f/u with BMP.   DMII:  HbA1c 8.7 on metformin 1000mg  bid, glipizide 10mg  bid and lantus 10U qHS with questionable compliance. Patient discharged with metformin 1000mg  bid and invokana 100mg  qAM. Please ensure patient is taking. F/u HbA1c in 3 mo.   2.  Labs / imaging needed at time of follow-up: BMP, HbA1c in 3 mo.   3.  Pending labs/ test needing follow-up: none  Follow-up Appointments: Follow-up [place], [facility] Follow up.   Why: You will need to call and schedule an appointment.  Contact information: [place] [address] [phone]        [facility]. Schedule an appointment as soon as possible for a visit in 4 week(s).   Specialty: Neurology Contact information:  [place]. Go on 03/11/2019.   Why: at 10:45AM Contact information: [address] [place] Follow up.   Why: The home health will contact you for the first home visit. Contact information: [place] by problem list: 1. Acute left basal ganglia stroke:  Patient presented with four days of right  sided weakness and facial droop concerning for stroke found to have acute infarct in the left basal ganglia and radiating white matter tracts consistent with lenticulostriate vessel insult. He was out of the window for tPA or permissive HTN. CT Head negative for acute intracranial bleed. MRI with acute infarction of left basal ganglia and radiating white matter tracts. Echo with LVEF 65-70% without any structural abnormalities noted. His CVA is likely secondary to chronic microvascular disease in setting of hypertension, hyperlipidemia and diabetes mellitus with history of medication noncompliance. Patient worked with PT/OT to regain strength. He was initially recommended for CIR but due to significant progress, he was recommended for home health PT/OT. Patient discharged home with home health PT/OT/SLP, aspirin and plavix x3 weeks followed by aspirin indefinitely, atorvastatin 80mg  daily, losartan and carvedilol for his hypertension, and metformin with invokana for his diabetes. He is to follow up with [facility] and then will need to establish care with a PCP for regular follow ups.   2. AKI:  Patient without any history of renal disease with baseline Cr of 0.7 presented with elevated sCr of 1.5 in setting of one day of diarrhea. Likely pre-renal and it improved with fluid resuscitation.   3. Hypertension: Patient with history of hypertension but history of medication noncompliance. Discharged with carvedilol 25mg  bid and losartan 100mg  daily.   4. Diabetes mellitus II:  Patient with history of DMII and prescribed metformin 1000mg  bid with meals, glipizide 10mg  bid with meals and lantus 10U qHS. However, unclear if patient was compliant with the medications. HbA1c 8.7 this admission. Patient was on SSI with Lantus 7U qHS during admission with CBG in 150-160 range. UA in February 2020 with proteinuria (30). Patient started on Invokana (SLGT-2 inhibitor) on discharge to decrease renal outcomes and nonfatal stroke  events. He is to continue with the metformin 100mg  bid.   Discharge Vitals:   BP 127/89 (BP Location: Right Arm)   Pulse 74   Temp 98.5 F (36.9 C) (Oral)   Resp 18   Ht 5\' 6"  (1.676 m)   Wt 74.7 kg   SpO2 99%   BMI 26.58 kg/m   Pertinent Labs, Studies, and Procedures:  CBC Latest Ref Rng & Units 02/25/2019 02/23/2019 02/23/2019  WBC 4.0 - 10.5 K/uL 4.7 - 5.8  Hemoglobin 13.0 - 17.0 g/dL 12.3(L) 12.6(L) 13.6  Hematocrit 39.0 - 52.0 % 35.4(L) 37.0(L) 38.6(L)  Platelets 150 - 400 K/uL 160 - 198   BMP Latest Ref Rng & Units 02/25/2019 02/24/2019 02/23/2019  Glucose 70 - 99 mg/dL 188(H) 206(H) 240(H)  BUN 6 - 20 mg/dL 10 10 17   Creatinine 0.61 - 1.24 mg/dL 0.97 1.05 1.20  Sodium 135 - 145 mmol/L 139 141 140  Potassium 3.5 - 5.1 mmol/L 4.0 4.1 3.8  Chloride 98 - 111 mmol/L 101 102 103  CO2 22 - 32 mmol/L 29 28 -  Calcium 8.9 - 10.3 mg/dL 8.8(L) 9.0 -  HbA1c 8.7  Lipid Panel     Component Value Date/Time   CHOL 191 02/23/2019 1350   CHOL 180 09/30/2013 1447   CHOL 160 08/11/2013 0340   TRIG 393 (H) 02/23/2019 1350   TRIG 213 (H) 08/11/2013 0340   HDL 44 02/23/2019 1350   HDL 35 (L) 09/30/2013 1447   HDL 25 (L) 08/11/2013 0340   CHOLHDL 4.3 02/23/2019 1350   VLDL 79 (H) 02/23/2019 1350   VLDL 43 (H) 08/11/2013 0340   LDLCALC 68 02/23/2019 1350   LDLCALC 117 (H) 09/30/2013 1447   LDLCALC 92 08/11/2013 0340   LABVLDL 28 09/30/2013 1447    CT HEAD WO CONTRAST 02/23/2019: IMPRESSION: 1. No acute intracranial hemorrhage or definite acute infarction. ASPECTS is 10. 2. New age-indeterminate small vessel infarcts of bilateral basal ganglia and adjacent white matter as well as the right thalamus. 3. Progression of mild chronic microvascular ischemic changes and minor parenchymal volume loss since 2015.  CT ANGIO HEAD AND NECK W WO CONTRAST 02/23/2019: IMPRESSION: CTA neck: The common carotid, internal carotid and vertebral arteries are patent within the neck without  significant stenosis. No significant atherosclerotic disease. CTA head: No intracranial large vessel occlusion or proximal high-grade arterial stenosis.  MR BRAIN WO CONTRAST 02/23/2019: IMPRESSION: Acute infarction in the left basal ganglia and radiating white matter tracts consistent with lenticulostriate vessel insult. No significant swelling. No visible hemorrhage. Old small vessel ischemic changes elsewhere throughout the brain as outlined above.  ECHO 02/24/2019:  IMPRESSIONS  1. Left ventricular ejection fraction, by visual estimation, is 65 to 70%. The left ventricle has hyperdynamic function. There is moderately increased left ventricular hypertrophy.  2. Global right ventricle has normal systolic function.The right ventricular size is normal.  3. Left atrial size was mildly dilated.  4. Right atrial size was normal.  5. The mitral valve is normal in structure. No evidence of mitral valve regurgitation.  6. The tricuspid valve is normal in structure. Tricuspid valve regurgitation is trivial.  7. The aortic valve is tricuspid. Aortic valve regurgitation is not visualized. No evidence of aortic valve stenosis.  8. The pulmonic valve was not well visualized. Pulmonic valve regurgitation is not visualized.  9. There is mild dilatation of the ascending aorta measuring 37 mm. 10. The inferior vena cava is normal in size with greater than 50% respiratory variability, suggesting right atrial pressure of 3 mmHg. 11. The interatrial septum was not well visualized.  Discharge Instructions: Discharge Instructions    Ambulatory referral to Neurology   Complete by: As directed    Follow up with stroke clinic NP (Jessica Vanschaick or Cecille Rubin, if both not available, consider Zachery Dauer, or Ahern) at [facility] in about 4 weeks. Thanks.   Ambulatory referral to Physical Medicine Rehab   Complete by: As directed    Seen by Dr. Ranell Patrick on consults. Impairments include right sided  weakness and depressed mood. Would benefit from outpatient physiatry follow-up to maximize function and safety.   Call MD for:  difficulty breathing, headache or visual disturbances   Complete by: As directed    Call MD for:  extreme fatigue   Complete by: As directed    Call MD for:  persistant dizziness or light-headedness   Complete by: As directed    Call MD for:  persistant nausea and vomiting   Complete by: As directed    Call MD for:  redness, tenderness, or signs of infection (pain, swelling, redness, odor or green/yellow discharge around incision site)  Complete by: As directed    Call MD for:  severe uncontrolled pain   Complete by: As directed    Call MD for:  temperature >100.4   Complete by: As directed    Diet - low sodium heart healthy   Complete by: As directed    Diet - low sodium heart healthy   Complete by: As directed    Discharge instructions   Complete by: As directed    Mr. Ramere, Manella were admitted with an acute stroke. You worked with physical therapy during your stay with improvement in your strength and speech. On discharge, you will continue to have home health physical therapy to visit you and continue to work with you.  On discharge, please take Aspirin 81mg  and Plavix 75mg  daily for 3 weeks. Your atorvastatin was also increased during your stay to 80mg . Please continue to take Atorvastatin 80mg  daily. Please continue taking your blood pressure medications and diabetes medications as prescribed.  You have been scheduled for a hospital follow up visit at the Internal Medicine Clinic at [facility] on 03/11/2019 at 10:45am.   Take care!   Discharge instructions   Complete by: As directed    Mr. Keanu, Viteri were admitted with an acute stroke. You worked with physical therapy during your stay with improvement in your strength and speech. On discharge, you will continue to have home health physical therapy to visit you and continue to work with you.   On discharge, please take Aspirin 81mg  and Plavix 75mg  daily for 3 weeks. Your atorvastatin was also increased during your stay to 80mg . Please continue to take Atorvastatin 80mg  daily. Please continue taking your blood pressure medications as prescribed. For your diabetes medications, continue taking metformin as prescribed. Please discontinue your glipizide and night time insulin injection. Please start Invokana 100mg  daily before breakfast.   You have been scheduled for a hospital follow up visit at the Internal Medicine Clinic at [facility] on 03/11/2019 at 10:45am.   Increase activity slowly   Complete by: As directed    Increase activity slowly   Complete by: As directed       Signed: Harvie Heck, MD  Internal Medicine, PGY-1 02/25/2019, 1:16 PM   Pager: [phone]

## 2019-02-25 NOTE — Telephone Encounter (Signed)
HFU per Dr Marva Panda; pt appt 12/17 1045am/NW

## 2019-02-26 ENCOUNTER — Other Ambulatory Visit: Payer: Self-pay | Admitting: Internal Medicine

## 2019-02-26 MED ORDER — ASPIRIN 81 MG PO CHEW: 81.0000 mg | CHEWABLE_TABLET | Freq: Every day | ORAL | 1 refills | Status: DC

## 2019-02-26 MED ORDER — LOSARTAN POTASSIUM 100 MG PO TABS: 100.0000 mg | ORAL_TABLET | Freq: Every day | ORAL | 1 refills | Status: DC

## 2019-02-26 MED ORDER — CARVEDILOL 25 MG PO TABS: 25.0000 mg | ORAL_TABLET | Freq: Two times a day (BID) | ORAL | 1 refills | Status: DC

## 2019-02-26 MED ORDER — CANAGLIFLOZIN 100 MG PO TABS: 100.0000 mg | ORAL_TABLET | Freq: Every day | ORAL | 0 refills | Status: DC

## 2019-02-26 MED ORDER — METFORMIN HCL 1000 MG PO TABS: 1000.0000 mg | ORAL_TABLET | Freq: Two times a day (BID) | ORAL | 1 refills | Status: DC

## 2019-02-26 MED ORDER — CLOPIDOGREL BISULFATE 75 MG PO TABS: 75.0000 mg | ORAL_TABLET | Freq: Every day | ORAL | 0 refills | Status: AC

## 2019-02-26 MED ORDER — ATORVASTATIN CALCIUM 80 MG PO TABS: 80.0000 mg | ORAL_TABLET | Freq: Every day | ORAL | 0 refills | Status: DC

## 2019-02-26 MED FILL — LOSARTAN POTASSIUM 100 MG T: 100 | 30 days supply | Qty: 30 | Fill #0

## 2019-02-26 MED FILL — CLOPIDOGREL 75 MG TABLET: 75 | 30 days supply | Qty: 30 | Fill #0

## 2019-02-26 MED FILL — CARVEDILOL 25 MG TABLET: 25 | 30 days supply | Qty: 60 | Fill #0

## 2019-02-26 MED FILL — INVOKANA 100 MG TABLET: 100 | 30 days supply | Qty: 30 | Fill #0

## 2019-02-26 MED FILL — metFORMIN HCL 1000 MG TABS: 1000 | 30 days supply | Qty: 60 | Fill #0

## 2019-02-26 MED FILL — ASPIRIN CHILD 81 MG TAB CHE: 81 | 30 days supply | Qty: 30 | Fill #0

## 2019-02-26 MED FILL — ATORVASTATIN 80 MG TABLET: 80 | 30 days supply | Qty: 30 | Fill #0

## 2019-02-26 NOTE — Progress Notes (Signed)
Called patient to follow up on discharge medications. Patient's medications were sent to [facility]; however, the medications will be quite expensive through [facility]. Patient is a Medicaid potential candidate. Discussed with patient about getting medications through the [facility] through the IM program. He is agreeable with picking up medications from the [facility] and will follow up in [facility] clinic on 12/17 at 10:45AM.

## 2019-03-02 NOTE — Telephone Encounter (Signed)
Transition Care Management Follow-up Telephone Call   Date discharged? 02/25/19.   How have you been since you were released from the hospital?  "Doing ok".  Do you understand why you were in the hospital? "Yes, I had a stroke".  Do you understand the discharge instructions? yes   Where were you discharged to? Home   Items Reviewed:  Medications reviewed: yes  Allergies reviewed: yes-stated he does not have any allergies.   Dietary changes reviewed: yes -stated he put himself on a low sodium, high protein diet.  Referrals reviewed: yes - stated home health suppose to come; has not came yet.   Functional Questionnaire:  Activities of Daily Living (ADLs):     Stated he's able to do everything at home for himself. He even takes care of his mother.   Any transportation issues/concerns?: no   Any patient concerns? no   Confirmed importance and date/time of follow-up visits scheduled - Yes; reminded of appt 12/127 @ 1045 AM.    Confirmed with patient if condition begins to worsen call PCP or go to the ER.  Patient was given the office number and encouraged to call back with question or concerns.  : yes

## 2019-03-03 IMAGING — DX DG CHEST 1V PORT
1 series · 1 of 1 positions shown · non-contrast
Comparison: [DATE] [DATE], [DATE] [DATE] p.m.

CLINICAL DATA: Increased shortness of breath and confusion.

EXAM:
PORTABLE CHEST 1 VIEW

[chest ap]
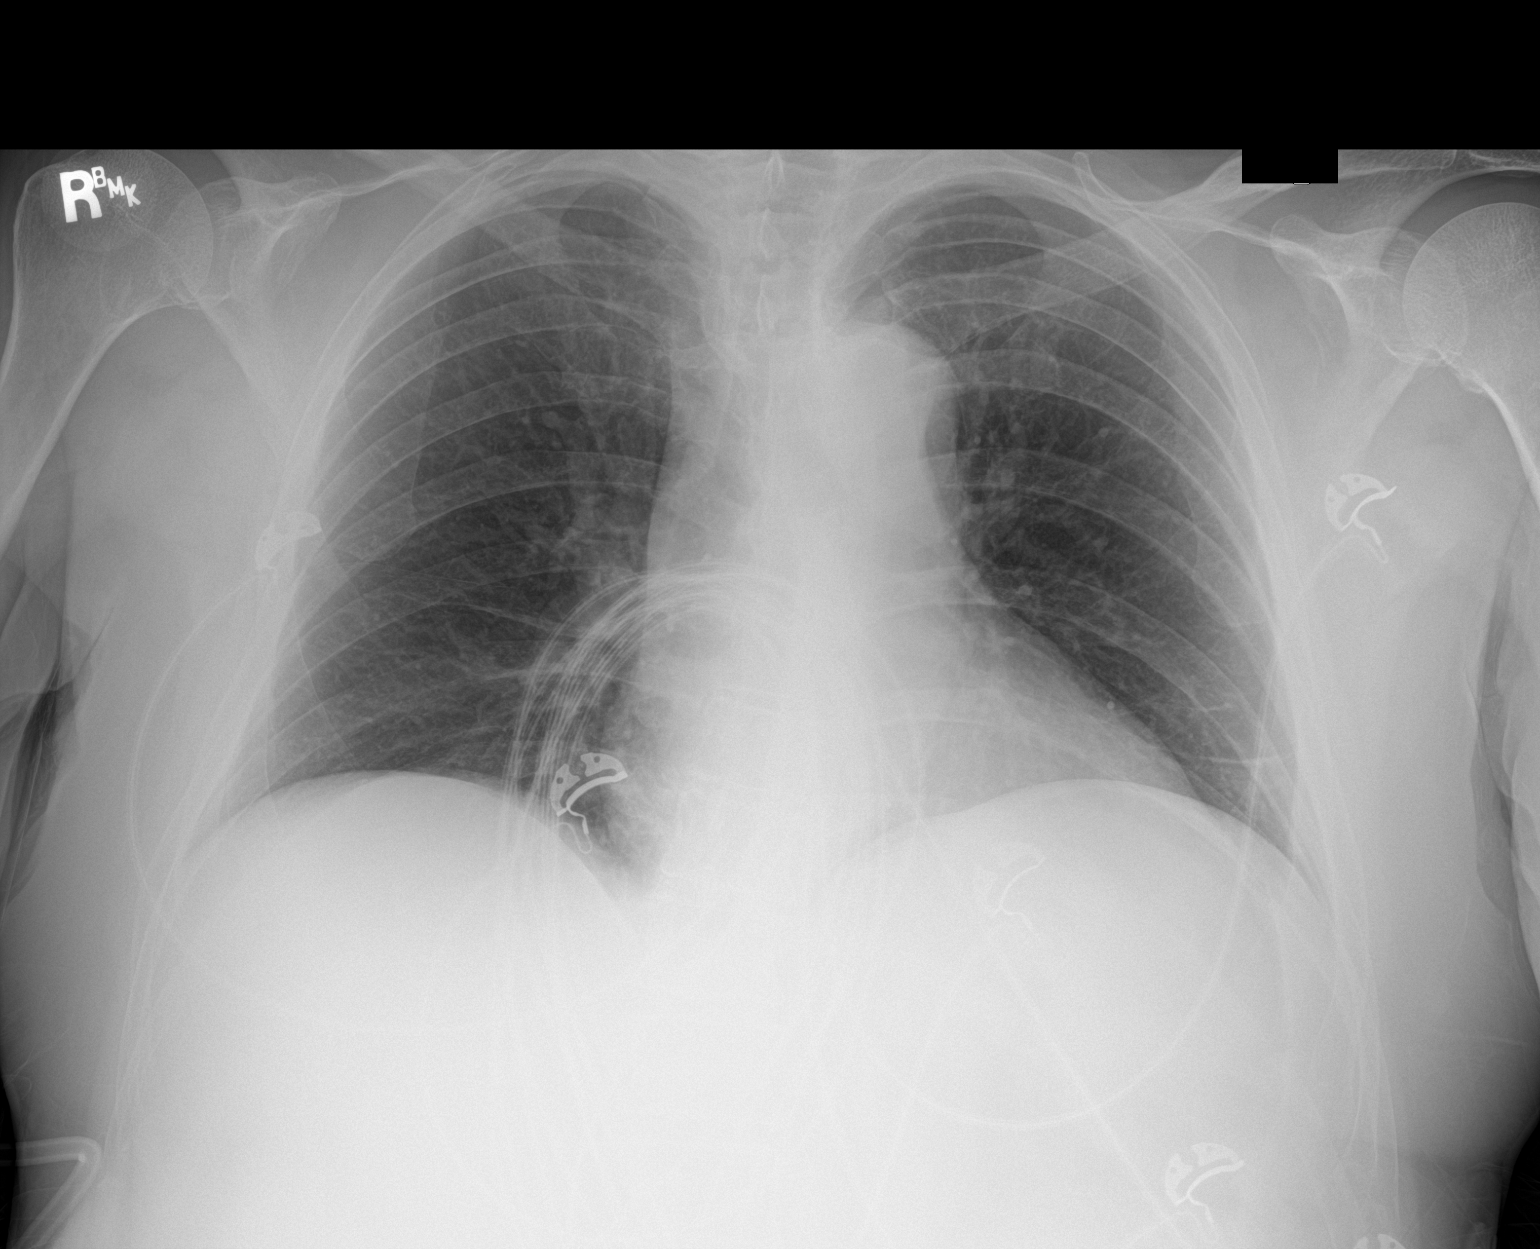

[1 of 1 positions shown; findings below may reference images not displayed]

FINDINGS: The heart size and mediastinal contours are within normal limits.
Both lungs are clear. The visualized skeletal structures are
unremarkable.
IMPRESSION: No active cardiopulmonary disease.

## 2019-03-03 IMAGING — CR DG CHEST 2V
1 series · 2 of 2 positions shown · non-contrast
Comparison: Radiographs 03/26/2018 and 12/30/2017.  CT 12/30/2017.

CLINICAL DATA: Shortness of breath starting around 3 o'clock today.

EXAM:
CHEST - 2 VIEW

[Series 1: dg chest 2 view · 0.14mm/px · 2 of 2 slices shown]
[im 1/2]
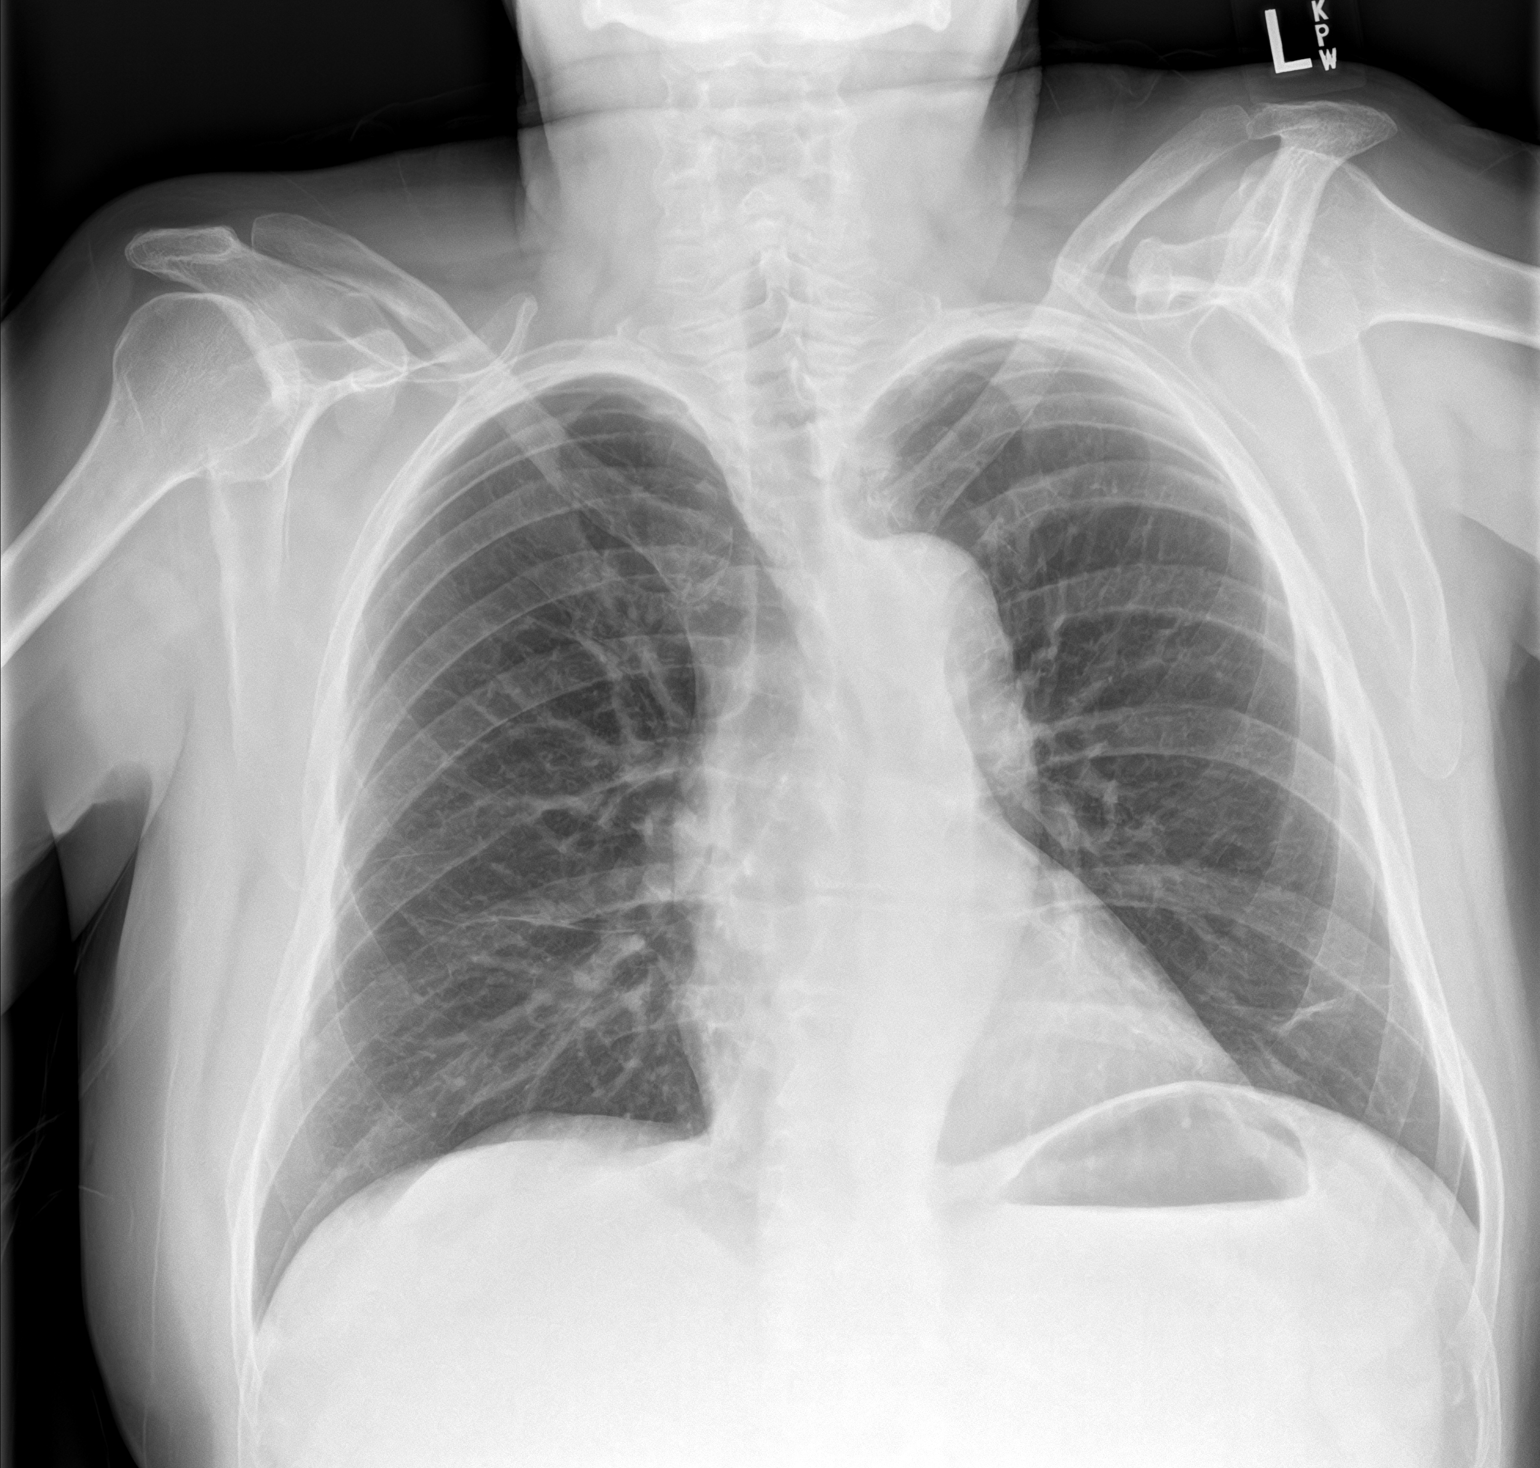
[im 2/2]
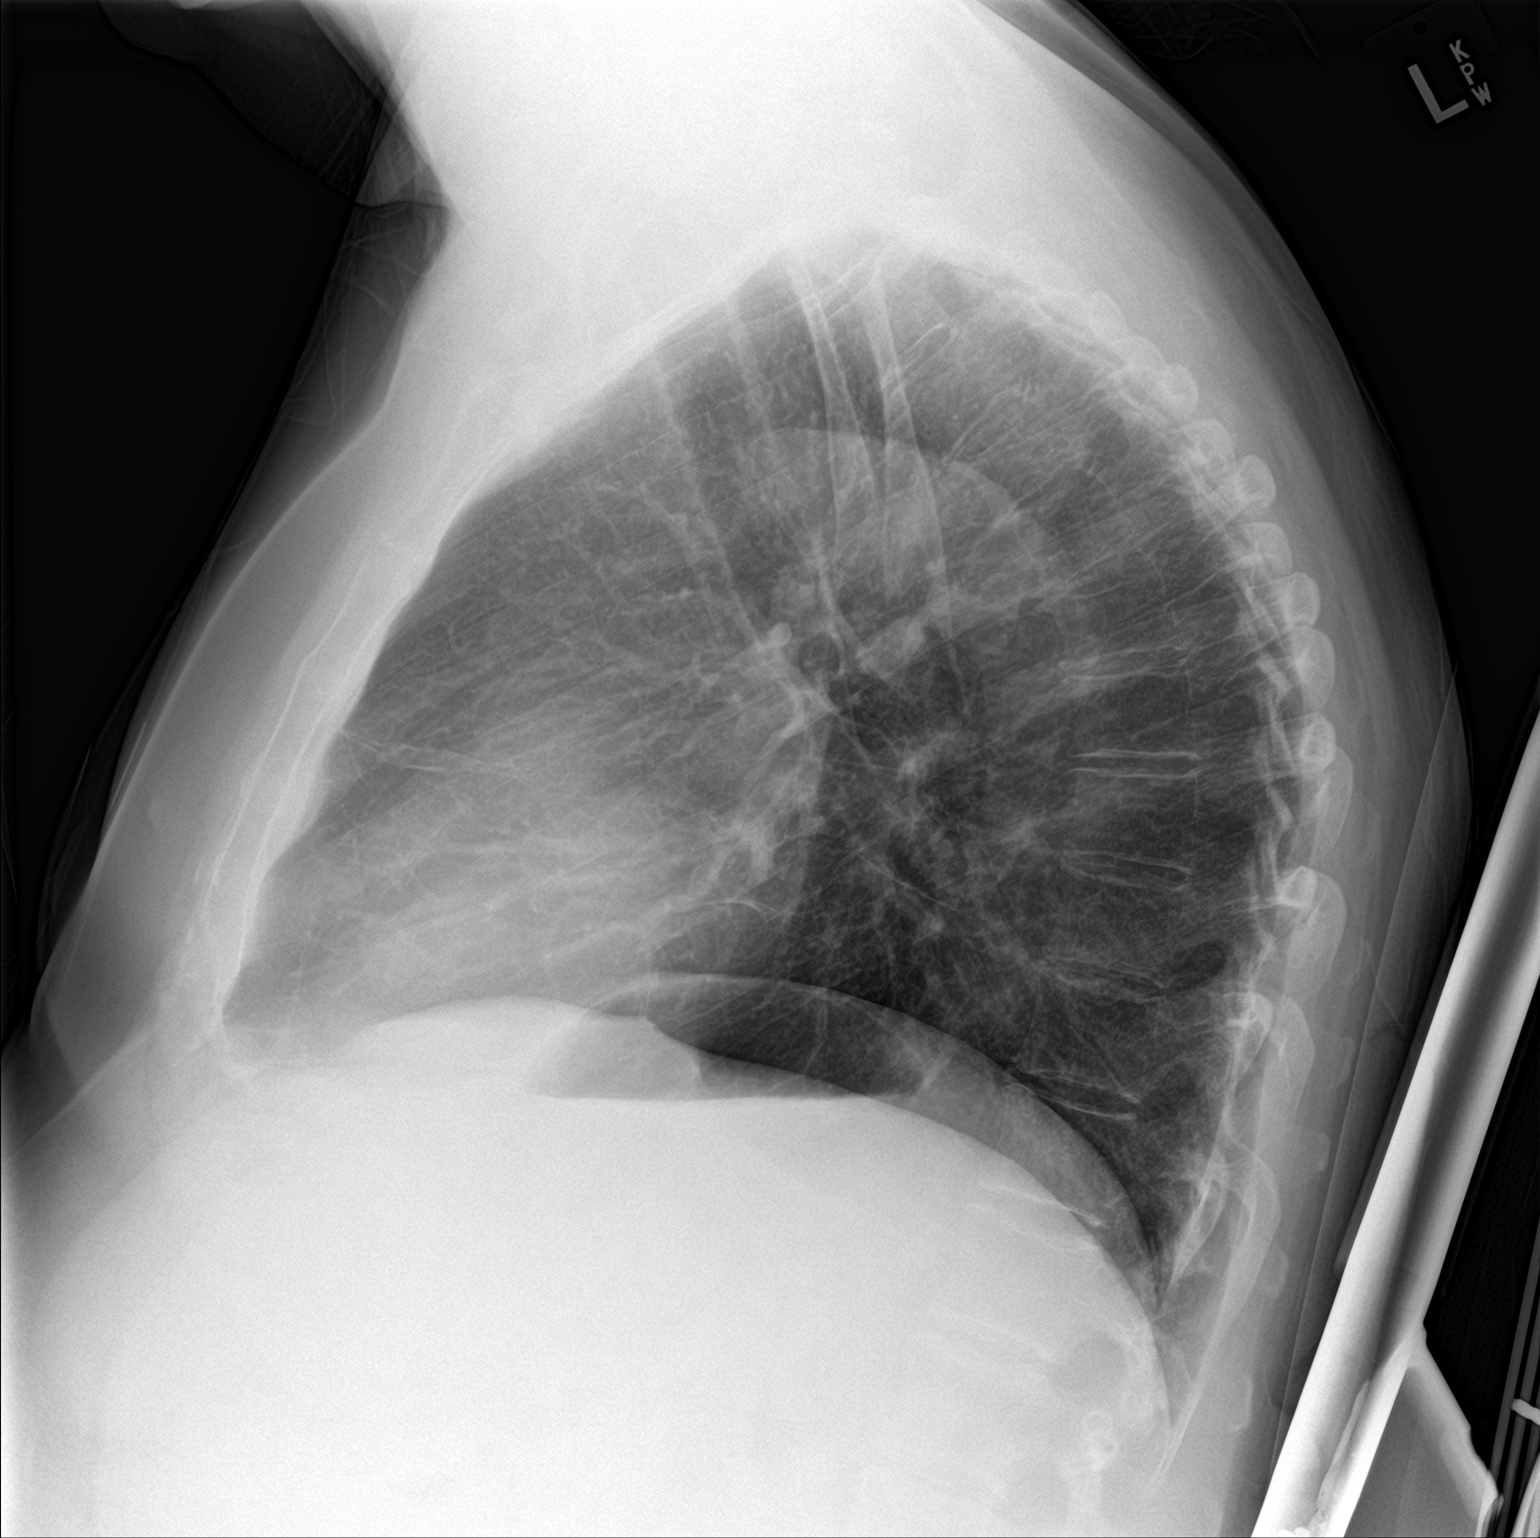

[2 of 2 positions shown; findings below may reference images not displayed]

FINDINGS: The heart size and mediastinal contours are stable. There is stable
linear atelectasis or scarring at both lung bases. No edema,
confluent airspace opacity, pleural effusion or pneumothorax. No
acute osseous findings.
IMPRESSION: Stable chest without evidence of acute process.

## 2019-03-04 IMAGING — US US RENAL
1 series · 13 of 25 positions shown · non-contrast
Comparison: CTA abdomen and pelvis 12/30/2017.

CLINICAL DATA: Acute renal insufficiency/acute kidney injury.
Current history of diabetes.

EXAM:
RENAL / URINARY TRACT ULTRASOUND COMPLETE

[Series 1: us renal · 13 of 43 slices shown]
[im 1/43]
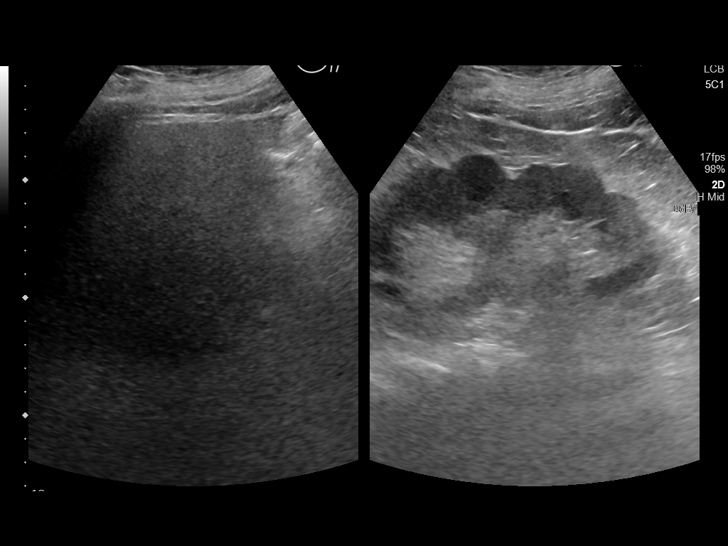
[im 4/43]
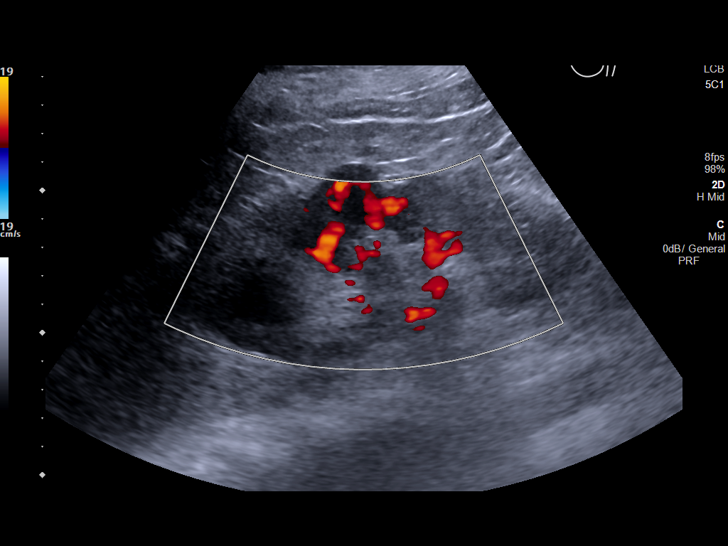
[im 8/43]
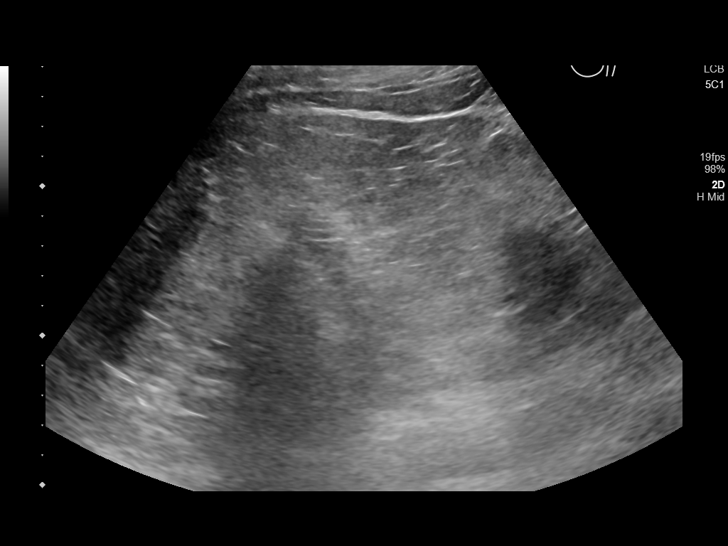
[im 11/43]
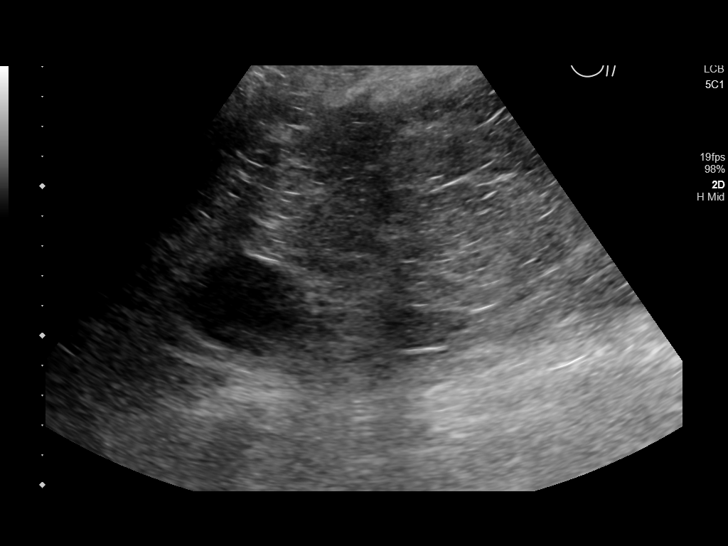
[im 15/43]
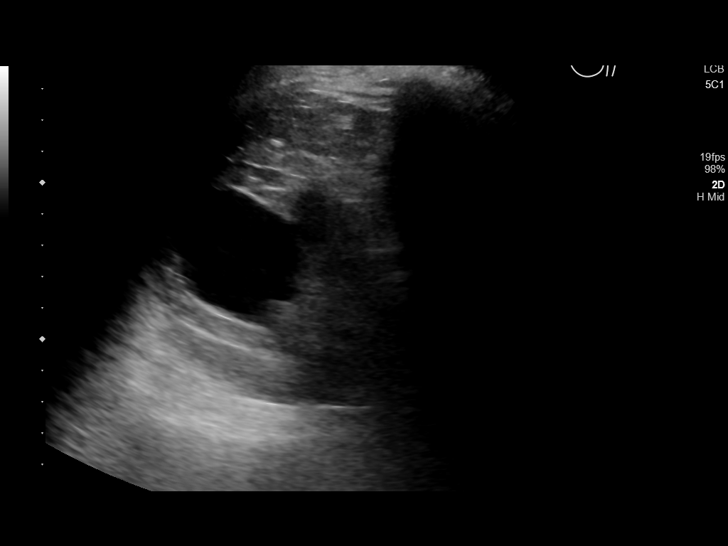
[im 18/43]
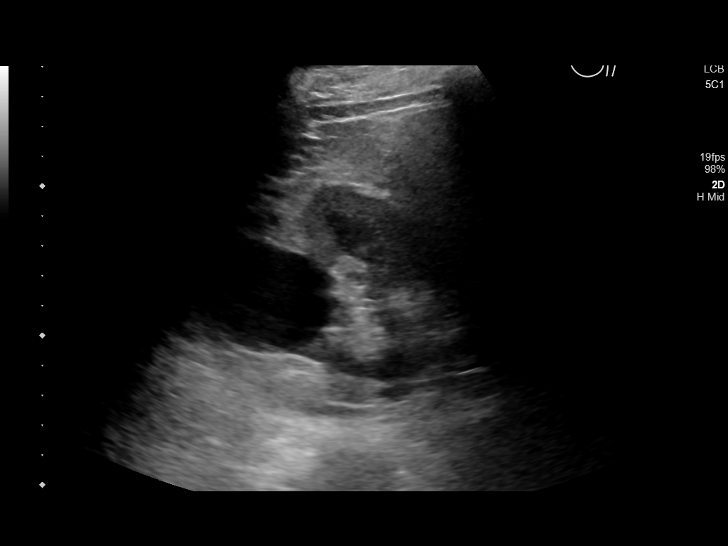
[im 22/43]
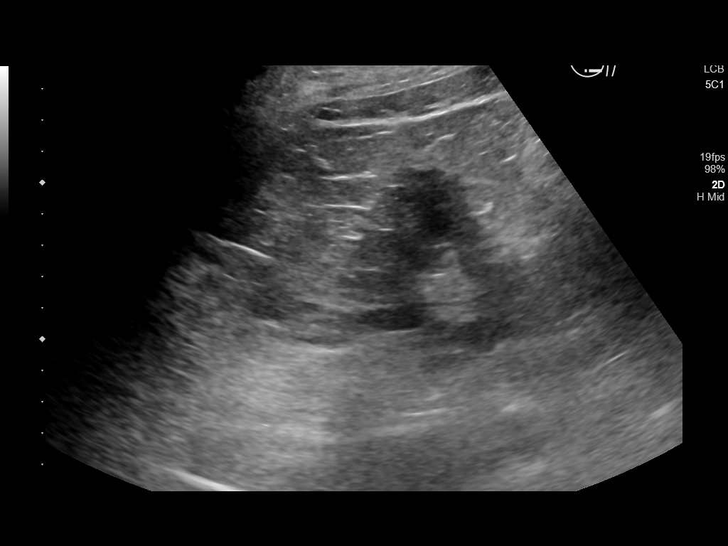
[im 25/43]
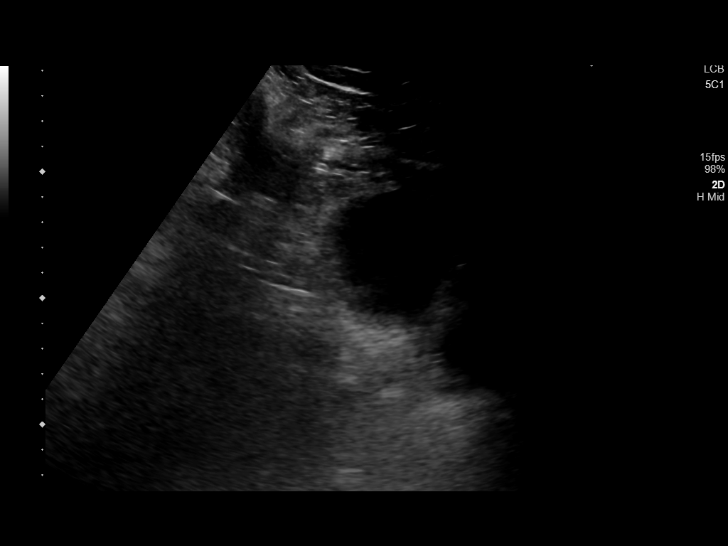
[im 29/43]
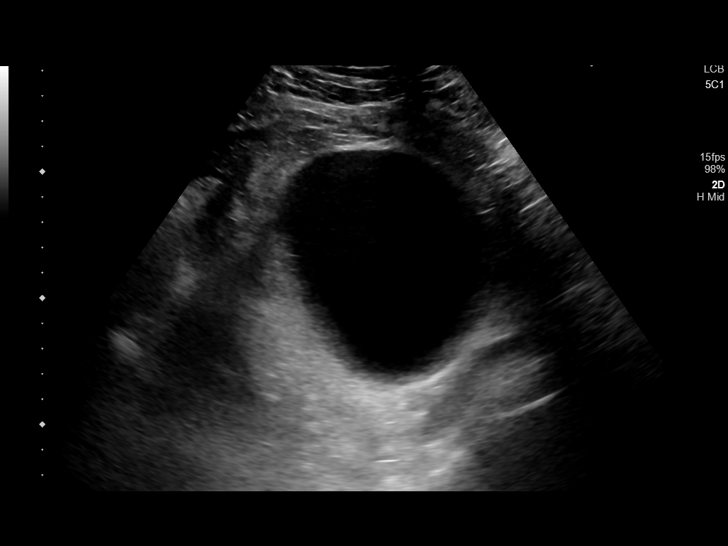
[im 32/43]
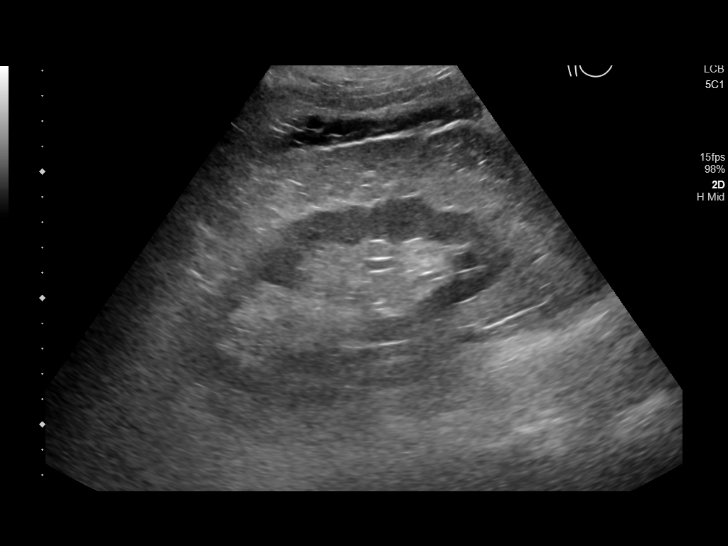
[im 36/43]
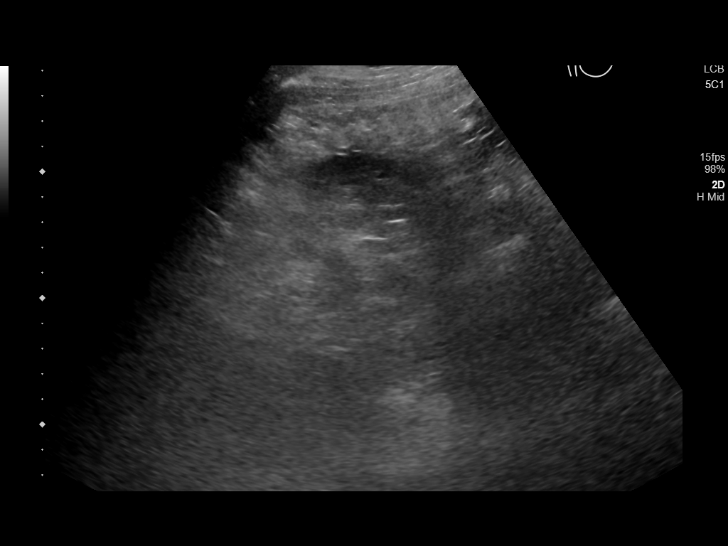
[im 39/43]
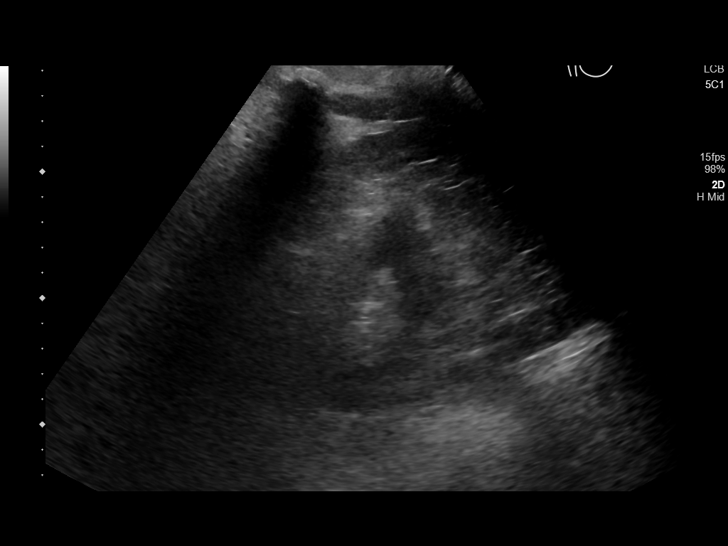
[im 43/43]
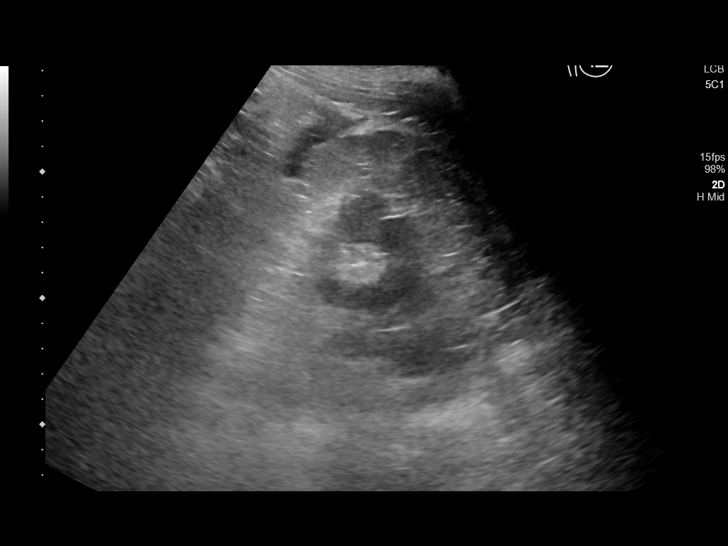

[13 of 25 positions shown; findings below may reference images not displayed]

FINDINGS: Right Kidney:

Renal measurements: Approximately 13.7 x 7.5 x 4.9 cm = volume: 268
mL. Lobular contour as noted on the prior CT. Approximate 3.7 x
x 4.2 cm benign simple cyst arising from the UPPER pole, unchanged.
No convincing solid renal mass. No hydronephrosis. Well-preserved
cortex. Normal parenchymal echotexture.

Left Kidney:

Renal measurements: Approximately 14.2 x 5.8 x 5.2 cm = volume: 228
mL. Lobular contour as noted on the prior CT. Mild diffuse cortical
thinning. Echogenic parenchyma. No focal parenchymal abnormality. No
hydronephrosis.

Bladder:

Normal for degree of bladder distention.
IMPRESSION: 1. No evidence of hydronephrosis involving either kidney to suggest
urinary tract obstruction as the cause of acute renal insufficiency.
2. Mild diffuse cortical thinning involving the LEFT kidney and
echogenic LEFT renal parenchyma as can be seen in patients with
kidney disease.
3. Lobular contour to both kidneys as noted on a prior CTA in
December 2017.
4. Benign approximate 4 cm simple cyst arising from the UPPER pole
of the RIGHT kidney.

## 2019-03-11 ENCOUNTER — Ambulatory Visit: Payer: Self-pay

## 2019-04-01 ENCOUNTER — Telehealth: Payer: Self-pay | Admitting: Pharmacy Technician

## 2019-04-01 NOTE — Telephone Encounter (Signed)
Patient failed to provide requested 2020 financial documentation. No additional medication assistance will be provided by [facility] without the required proof of income documentation. Patient notified by letter  Leniyah Martell CPhT [facility] 

## 2019-04-20 ENCOUNTER — Emergency Department
Admission: EM | Admit: 2019-04-20 | Discharge: 2019-04-21 | Disposition: A | Discharge status: Other Acute Inpatient Hospital | Payer: Medicaid Other | Attending: Emergency Medicine | Admitting: Emergency Medicine

## 2019-04-20 ENCOUNTER — Encounter: Payer: Self-pay | Admitting: Emergency Medicine

## 2019-04-20 ENCOUNTER — Other Ambulatory Visit: Payer: Self-pay

## 2019-04-20 DIAGNOSIS — T43022A Poisoning by tetracyclic antidepressants, intentional self-harm, initial encounter: Secondary | ICD-10-CM | POA: Insufficient documentation

## 2019-04-20 DIAGNOSIS — R45851 Suicidal ideations: Secondary | ICD-10-CM | POA: Insufficient documentation

## 2019-04-20 DIAGNOSIS — Z7984 Long term (current) use of oral hypoglycemic drugs: Secondary | ICD-10-CM | POA: Diagnosis not present

## 2019-04-20 DIAGNOSIS — F332 Major depressive disorder, recurrent severe without psychotic features: Secondary | ICD-10-CM | POA: Insufficient documentation

## 2019-04-20 DIAGNOSIS — T50902A Poisoning by unspecified drugs, medicaments and biological substances, intentional self-harm, initial encounter: Secondary | ICD-10-CM

## 2019-04-20 DIAGNOSIS — Z20822 Contact with and (suspected) exposure to covid-19: Secondary | ICD-10-CM | POA: Insufficient documentation

## 2019-04-20 DIAGNOSIS — Z7982 Long term (current) use of aspirin: Secondary | ICD-10-CM | POA: Insufficient documentation

## 2019-04-20 DIAGNOSIS — Z87891 Personal history of nicotine dependence: Secondary | ICD-10-CM | POA: Diagnosis not present

## 2019-04-20 DIAGNOSIS — I10 Essential (primary) hypertension: Secondary | ICD-10-CM | POA: Diagnosis not present

## 2019-04-20 DIAGNOSIS — Z79899 Other long term (current) drug therapy: Secondary | ICD-10-CM | POA: Diagnosis not present

## 2019-04-20 DIAGNOSIS — E119 Type 2 diabetes mellitus without complications: Secondary | ICD-10-CM | POA: Diagnosis not present

## 2019-04-20 DIAGNOSIS — I2510 Atherosclerotic heart disease of native coronary artery without angina pectoris: Secondary | ICD-10-CM | POA: Diagnosis not present

## 2019-04-20 DIAGNOSIS — T43212A Poisoning by selective serotonin and norepinephrine reuptake inhibitors, intentional self-harm, initial encounter: Secondary | ICD-10-CM | POA: Diagnosis not present

## 2019-04-20 LAB — SALICYLATE LEVEL: Salicylate Lvl: <7 mg/dL — ABNORMAL LOW (ref 7.0–30.0)

## 2019-04-20 LAB — CBC
HCT: 41.7 % (ref 39.0–52.0)
Hemoglobin: 14.9 g/dL (ref 13.0–17.0)
MCH: 31.1 pg (ref 26.0–34.0)
MCHC: 35.7 g/dL (ref 30.0–36.0)
MCV: 87.1 fL (ref 80.0–100.0)
Platelets: 295 10*3/uL (ref 150–400)
RBC: 4.79 MIL/uL (ref 4.22–5.81)
RDW: 12.6 % (ref 11.5–15.5)
WBC: 8.4 10*3/uL (ref 4.0–10.5)
nRBC: 0 % (ref 0.0–0.2)

## 2019-04-20 LAB — COMPREHENSIVE METABOLIC PANEL
ALT: 26 U/L (ref 0–44)
AST: 23 U/L (ref 15–41)
Albumin: 4.9 g/dL (ref 3.5–5.0)
Alkaline Phosphatase: 71 U/L (ref 38–126)
Anion gap: 11 (ref 5–15)
BUN: 12 mg/dL (ref 6–20)
CO2: 26 mmol/L (ref 22–32)
Calcium: 10.2 mg/dL (ref 8.9–10.3)
Chloride: 100 mmol/L (ref 98–111)
Creatinine, Ser: 0.95 mg/dL (ref 0.61–1.24)
GFR calc Af Amer: >60 mL/min (ref >60)
GFR calc non Af Amer: >60 mL/min (ref >60)
Glucose, Bld: 283 mg/dL — ABNORMAL HIGH (ref 70–99)
Potassium: 4.1 mmol/L (ref 3.5–5.1)
Sodium: 137 mmol/L (ref 135–145)
Total Bilirubin: 1.3 mg/dL — ABNORMAL HIGH (ref 0.3–1.2)
Total Protein: 7.9 g/dL (ref 6.5–8.1)

## 2019-04-20 LAB — RESPIRATORY PANEL BY RT PCR (FLU A&B, COVID)
Influenza A by PCR: NEGATIVE
Influenza B by PCR: NEGATIVE
SARS Coronavirus 2 by RT PCR: NEGATIVE

## 2019-04-20 LAB — ETHANOL: Alcohol, Ethyl (B): <10 mg/dL (ref <10)

## 2019-04-20 LAB — ACETAMINOPHEN LEVEL: Acetaminophen (Tylenol), Serum: <10 ug/mL — ABNORMAL LOW (ref 10–30)

## 2019-04-20 NOTE — ED Notes (Signed)
Dr. Funke at bedside. 

## 2019-04-20 NOTE — ED Notes (Signed)
Reported medications taken to poison control. Per poison control pt may experience drowsiness but is our of the 6 hour window of concern as long as EKG is non concerning. Dr. Jari Pigg informed.

## 2019-04-20 NOTE — ED Provider Notes (Signed)
[facility] Emergency Department Provider Note  ____________________________________________   First MD Initiated Contact with Patient 04/20/19 1956     (approximate)  I have reviewed the triage vital signs and the nursing notes.   HISTORY  Chief Complaint Mental Health Problem and Ingestion    HPI Christian [name] is a 49 y.o. male who lives with his mother who comes in for overdose.  Patient stated "I got tired of my mom's mouth today and took all my pills".  Patient reports that he took 8 trazodone of 50mg  and 8 mirtazapine 30mg  around 10:30 (half of them) and then the other half at 2:30pm.   Patient did admit that it was for SI.  Patient states his SI has been severe, constant, nothing makes better, nothing makes it worse.  Denies any hallucinations.  Denies any alcohol or drug use.  Denies taking any ibuprofen, Tylenol, aspirin.  States he did not take any other pills after 2:30 PM.  Denies any other infectious symptoms or any other concerns.  States he just feels a little fatigued right now.     Past Medical History:  Diagnosis Date  . CAD (coronary artery disease)    a. 07/2013 NSTEMI/PCI: LCX 33m (4.0x23 Xience DES), RCA 23m, EF > 55%;  b. 07/2013 Echo: EF 60-65%, diast dysfxn, mild LVH, mildly dil LA, nl RV size/fxn, nl RVSP.  Marland Kitchen History of tobacco abuse    a. quit ~ 2010  . Hyperlipidemia   . Morbid obesity ([place])    a. weighed 168 @ age 62.  . Scoliosis     Patient Active Problem List   Diagnosis Date Noted  . Acute CVA (cerebrovascular accident) ([place]) 02/23/2019  . Major depression 02/05/2019  . Severe episode of recurrent major depressive disorder, without psychotic features ([place]) 05/29/2018  . Suicidal ideation 05/29/2018  . Alcohol use disorder, moderate, dependence ([place]) 05/29/2018  . Hyperosmolar non-ketotic state in patient with type 2 diabetes mellitus ([place]) 05/08/2018  . Chest pain 12/30/2017  . CAD (coronary artery disease)   . HTN  (hypertension)   . Hyperlipidemia   . Diabetes mellitus, type II ([place])   . Morbid obesity ([place])   . History of tobacco abuse     Past Surgical History:  Procedure Laterality Date  . ADENOIDECTOMY    . CARDIAC CATHETERIZATION  07/2013   [facility]'x1 stent    Prior to Admission medications   Medication Sig Start Date End Date Taking? Authorizing Provider  aspirin 81 MG chewable tablet Chew 1 tablet (81 mg total) by mouth daily. 02/26/19   Harvie Heck, MD  atorvastatin (LIPITOR) 80 MG tablet Take 1 tablet (80 mg total) by mouth daily at 6 PM. 02/26/19 03/28/19  Harvie Heck, MD  canagliflozin (INVOKANA) 100 MG TABS tablet Take 1 tablet (100 mg total) by mouth daily before breakfast. 02/26/19   Harvie Heck, MD  carvedilol (COREG) 25 MG tablet Take 1 tablet (25 mg total) by mouth 2 (two) times daily with a meal. 02/26/19   Aslam, Loralyn Freshwater, MD  gabapentin (NEURONTIN) 100 MG capsule Take 2 capsules (200 mg total) by mouth 3 (three) times daily. 02/10/19   Money, Lowry Ram, FNP  losartan (COZAAR) 100 MG tablet Take 1 tablet (100 mg total) by mouth daily. 02/26/19   Harvie Heck, MD  metFORMIN (GLUCOPHAGE) 1000 MG tablet Take 1 tablet (1,000 mg total) by mouth 2 (two) times daily with a meal. 02/26/19   Harvie Heck, MD  mirtazapine (REMERON) 30  MG tablet Take 1 tablet (30 mg total) by mouth at bedtime. 02/10/19   Money, Lowry Ram, FNP  traZODone (DESYREL) 50 MG tablet Take 1 tablet (50 mg total) by mouth at bedtime as needed for sleep. Patient taking differently: Take 50 mg by mouth at bedtime.  02/10/19   Money, Lowry Ram, FNP    Allergies Patient has no known allergies.  Family History  Problem Relation Age of Onset  . Heart disease Mother   . Esophageal cancer Father     Social History Social History   Tobacco Use  . Smoking status: Former Smoker    Packs/day: 0.50    Years: 20.00    Pack years: 10.00    Types: Cigarettes  . Smokeless tobacco: Never Used  Substance Use Topics  . Alcohol use:  Yes  . Drug use: No    Types: Marijuana    Comment: past      Review of Systems Constitutional: No fever/chills, + fatigue  Eyes: No visual changes. ENT: No sore throat. Cardiovascular: Denies chest pain. Respiratory: Denies shortness of breath. Gastrointestinal: No abdominal pain.  No nausea, no vomiting.  No diarrhea.  No constipation. Genitourinary: Negative for dysuria. Musculoskeletal: Negative for back pain. Skin: Negative for rash. Neurological: Negative for headaches, focal weakness or numbness. All other ROS negative ____________________________________________   PHYSICAL EXAM:  VITAL SIGNS: ED Triage Vitals [04/20/19 1936]  Enc Vitals Group     BP (!) 159/110     Pulse Rate (!) 115     Resp 16     Temp 98.8 F (37.1 C)     Temp Source Oral     SpO2 99 %     Weight      Height      Head Circumference      Peak Flow      Pain Score      Pain Loc      Pain Edu?      Excl. in [place]?     Constitutional: Alert and oriented. Well appearing and in no acute distress. Eyes: Conjunctivae are normal. EOMI. Head: Atraumatic. Nose: No congestion/rhinnorhea. Mouth/Throat: Mucous membranes are moist.   Neck: No stridor. Trachea Midline. FROM Cardiovascular: Normal rate, regular rhythm. Grossly normal heart sounds.  Good peripheral circulation. Respiratory: Normal respiratory effort.  No retractions. Lungs CTAB. Gastrointestinal: Soft and nontender. No distention. No abdominal bruits.  Musculoskeletal: No lower extremity tenderness nor edema.  No joint effusions. Neurologic:  Normal speech and language. No gross focal neurologic deficits are appreciated.  Skin:  Skin is warm, dry and intact. No rash noted. Psychiatric: Mood and affect are normal. Speech and behavior are normal. + SI  GU: Deferred   ____________________________________________   LABS (all labs ordered are listed, but only abnormal results are displayed)  Labs Reviewed  COMPREHENSIVE METABOLIC  PANEL - Abnormal; Notable for the following components:      Result Value   Glucose, Bld 283 (*)    Total Bilirubin 1.3 (*)    All other components within normal limits  SALICYLATE LEVEL - Abnormal; Notable for the following components:   Salicylate Lvl [id number] (*)    All other components within normal limits  ACETAMINOPHEN LEVEL - Abnormal; Notable for the following components:   Acetaminophen (Tylenol), Serum <10 (*)    All other components within normal limits  ETHANOL  CBC  URINE DRUG SCREEN, QUALITATIVE ([facility] ONLY)   ____________________________________________   ED ECG REPORT I, Vanessa Dorchester, the  attending physician, personally viewed and interpreted this ECG.  EKG is sinus tachycardia rate of 104, no ST elevation, does have a T wave inversion in lead III and aVF.  Does have some Q waves in the inferior leads.  Reviewed prior EKGs and he has had these Q waves previously.  ____________________________________________   INITIAL IMPRESSION / ASSESSMENT AND PLAN / ED COURSE  SRUJAN VALERA was evaluated in Emergency Department on 04/20/2019 for the symptoms described in the history of present illness. He was evaluated in the context of the global COVID-19 pandemic, which necessitated consideration that the patient might be at risk for infection with the SARS-CoV-2 virus that causes COVID-19. Institutional protocols and algorithms that pertain to the evaluation of patients at risk for COVID-19 are in a state of rapid change based on information released by regulatory bodies including the CDC and federal and state organizations. These policies and algorithms were followed during the patient's care in the ED.    Pt is without any acute medical complaints.  Patient did overdose they are mirtazapine and trazodone.  EKG was sinus tachycardia but otherwise normal.  Patient denied chest pain.  Poison control was called and patient was cleared given the overdose occurred greater than 6 hours ago.   Patient is willing to stay voluntary but understands that if he is to leave that we would IVC him.  No exam findings to suggest medical cause of current presentation. Will order psychiatric screening labs and discuss further w/ psychiatric service.  D/d includes but is not limited to psychiatric disease, behavioral/personality disorder, inadequate socioeconomic support, medical.  Based on HPI, exam, unremarkable labs, no concern for acute medical problem at this time. No rigidity, clonus, hyperthermia, focal neurologic deficit, diaphoresis, tachycardia, meningismus, ataxia, gait abnormality or other finding to suggest this visit represents a non-psychiatric problem. Screening labs reviewed.    Given this, pt medically cleared, to be dispositioned per Psych.    ____________________________________________   FINAL CLINICAL IMPRESSION(S) / ED DIAGNOSES   Final diagnoses:  Suicide ideation  Intentional drug overdose, initial encounter ([place])      MEDICATIONS GIVEN DURING THIS VISIT:  Medications - No data to display   ED Discharge Orders    None       Note:  This document was prepared using Dragon voice recognition software and may include unintentional dictation errors.   Vanessa Elmer, MD 04/20/19 2144

## 2019-04-20 NOTE — ED Notes (Signed)
Pt speaking with this RN in NAD, reports taking 2 or 3 trazodone and/or mirtazapine around 10:30 am and then again around 2:30 pm. Pt states "I tried to overdose on sleeping pills". Reports "my mom is driving me crazy". Speech clear, pt cooperative at this time.

## 2019-04-20 NOTE — ED Notes (Signed)
VOL/ SOC ordered & called waiting on callback

## 2019-04-20 NOTE — ED Triage Notes (Signed)
Pt lives with mother and sts, "I got tired of my moms mouth today and took all of my pills." Pt reports he has taken 6-8 Trazodone and 6-8 Mirtazapine since 10:30 this AM. Pt admits to SI and denies HI. Pt sts, "I came here cause I ran out of pills to take." Pt is calm and cooperative in triage.

## 2019-04-20 NOTE — ED Notes (Signed)
Per sister, she is at the home of mother and is taking care of her. Christian [name] reports to this RN that she is able to take care of mother with no concern of safety.

## 2019-04-20 NOTE — ED Notes (Addendum)
Pt reports that he lives with his elderly mother who suffers from dementia and that he is her sole care taker. Pt in triage sts, "She is crazy and I cant take it anymore." Pt reports that he drove self here to ED. Concern for mother at home with dementia. Pt gives this RN permission to call sister.

## 2019-04-20 NOTE — ED Notes (Signed)
Pt dressed into purple scrubs. Pt belongings; black tennis shoes, black shoes, black socks, brown leather belt, grey tshirt, purple beanie hat, black jacket, and military tag necklace.  -[place]

## 2019-04-21 ENCOUNTER — Inpatient Hospital Stay
Admission: RE | Admit: 2019-04-21 | Discharge: 2019-04-27 | DRG: 885 | Disposition: A | Discharge status: Home / Self Care | Payer: No Typology Code available for payment source | Source: Intra-hospital | Attending: Psychiatry | Admitting: Psychiatry

## 2019-04-21 ENCOUNTER — Encounter: Payer: Self-pay | Admitting: Psychiatry

## 2019-04-21 DIAGNOSIS — Z79899 Other long term (current) drug therapy: Secondary | ICD-10-CM

## 2019-04-21 DIAGNOSIS — F1021 Alcohol dependence, in remission: Secondary | ICD-10-CM | POA: Diagnosis present

## 2019-04-21 DIAGNOSIS — I2510 Atherosclerotic heart disease of native coronary artery without angina pectoris: Secondary | ICD-10-CM | POA: Diagnosis present

## 2019-04-21 DIAGNOSIS — E119 Type 2 diabetes mellitus without complications: Secondary | ICD-10-CM | POA: Diagnosis present

## 2019-04-21 DIAGNOSIS — I252 Old myocardial infarction: Secondary | ICD-10-CM | POA: Diagnosis not present

## 2019-04-21 DIAGNOSIS — Z951 Presence of aortocoronary bypass graft: Secondary | ICD-10-CM

## 2019-04-21 DIAGNOSIS — I10 Essential (primary) hypertension: Secondary | ICD-10-CM | POA: Diagnosis present

## 2019-04-21 DIAGNOSIS — E785 Hyperlipidemia, unspecified: Secondary | ICD-10-CM | POA: Diagnosis present

## 2019-04-21 DIAGNOSIS — Z955 Presence of coronary angioplasty implant and graft: Secondary | ICD-10-CM

## 2019-04-21 DIAGNOSIS — Z7984 Long term (current) use of oral hypoglycemic drugs: Secondary | ICD-10-CM | POA: Diagnosis not present

## 2019-04-21 DIAGNOSIS — Z8249 Family history of ischemic heart disease and other diseases of the circulatory system: Secondary | ICD-10-CM

## 2019-04-21 DIAGNOSIS — F419 Anxiety disorder, unspecified: Secondary | ICD-10-CM | POA: Diagnosis present

## 2019-04-21 DIAGNOSIS — F339 Major depressive disorder, recurrent, unspecified: Secondary | ICD-10-CM | POA: Diagnosis present

## 2019-04-21 DIAGNOSIS — Z87891 Personal history of nicotine dependence: Secondary | ICD-10-CM | POA: Diagnosis not present

## 2019-04-21 DIAGNOSIS — Z7982 Long term (current) use of aspirin: Secondary | ICD-10-CM | POA: Diagnosis not present

## 2019-04-21 DIAGNOSIS — F332 Major depressive disorder, recurrent severe without psychotic features: Secondary | ICD-10-CM | POA: Diagnosis not present

## 2019-04-21 DIAGNOSIS — F329 Major depressive disorder, single episode, unspecified: Secondary | ICD-10-CM | POA: Diagnosis present

## 2019-04-21 LAB — URINE DRUG SCREEN, QUALITATIVE (ARMC ONLY)
Amphetamines, Ur Screen: NOT DETECTED
Barbiturates, Ur Screen: NOT DETECTED
Benzodiazepine, Ur Scrn: NOT DETECTED
Cannabinoid 50 Ng, Ur ~~LOC~~: NOT DETECTED
Cocaine Metabolite,Ur ~~LOC~~: NOT DETECTED
MDMA (Ecstasy)Ur Screen: NOT DETECTED
Methadone Scn, Ur: NOT DETECTED
Opiate, Ur Screen: NOT DETECTED
Phencyclidine (PCP) Ur S: NOT DETECTED
Tricyclic, Ur Screen: NOT DETECTED

## 2019-04-21 MED ORDER — CARVEDILOL 12.5 MG PO TABS
25.0000 mg | ORAL_TABLET | Freq: Two times a day (BID) | ORAL | Status: DC
  Administered 2019-04-21 – 2019-04-26 (×8): 25 mg via ORAL
  Filled 2019-04-21 (×12): qty 2

## 2019-04-21 MED ORDER — ALUM & MAG HYDROXIDE-SIMETH 200-200-20 MG/5ML PO SUSP: 30.0000 mL | ORAL | Status: DC | PRN

## 2019-04-21 MED ORDER — ACETAMINOPHEN 325 MG PO TABS: 650.0000 mg | ORAL_TABLET | Freq: Four times a day (QID) | ORAL | Status: DC | PRN

## 2019-04-21 MED ORDER — LOSARTAN POTASSIUM 50 MG PO TABS
100.0000 mg | ORAL_TABLET | Freq: Every day | ORAL | Status: DC
  Administered 2019-04-22 – 2019-04-26 (×5): 100 mg via ORAL
  Filled 2019-04-21 (×6): qty 2

## 2019-04-21 MED ORDER — ATORVASTATIN CALCIUM 20 MG PO TABS
80.0000 mg | ORAL_TABLET | Freq: Every day | ORAL | Status: DC
  Administered 2019-04-21 – 2019-04-26 (×6): 80 mg via ORAL
  Filled 2019-04-21 (×6): qty 4

## 2019-04-21 MED ORDER — METFORMIN HCL 500 MG PO TABS
1000.0000 mg | ORAL_TABLET | Freq: Two times a day (BID) | ORAL | Status: DC
  Administered 2019-04-21 – 2019-04-27 (×12): 1000 mg via ORAL
  Filled 2019-04-21 (×12): qty 2

## 2019-04-21 MED ORDER — MAGNESIUM HYDROXIDE 400 MG/5ML PO SUSP: 30.0000 mL | Freq: Every day | ORAL | Status: DC | PRN

## 2019-04-21 MED ORDER — ASPIRIN 81 MG PO CHEW
81.0000 mg | CHEWABLE_TABLET | Freq: Every day | ORAL | Status: DC
  Administered 2019-04-21 – 2019-04-27 (×7): 81 mg via ORAL
  Filled 2019-04-21 (×7): qty 1

## 2019-04-21 NOTE — Tx Team (Signed)
Initial Treatment Plan 04/21/2019 6:09 PM Christian [name] [id number]    PATIENT STRESSORS: Financial difficulties Health problems Marital or family conflict   PATIENT STRENGTHS: Ability for insight Active sense of humor Average or above average intelligence Capable of independent living Communication skills   PATIENT IDENTIFIED PROBLEMS: Suicidal 04/21/19  Mother  Sick  Having to care for her  04/21/19  No job 127/21  Medical Issues Stroke  In December   04/21/19               DISCHARGE CRITERIA:  Ability to meet basic life and health needs Adequate post-discharge living arrangements Improved stabilization in mood, thinking, and/or behavior  PRELIMINARY DISCHARGE PLAN: Outpatient therapy Return to previous living arrangement  PATIENT/FAMILY INVOLVEMENT: This treatment plan has been presented to and reviewed with the patient, Christian [name], and/or family member,  .  The patient and family have been given the opportunity to ask questions and make suggestions.  Leodis Liverpool, RN 04/21/2019, 6:09 PM

## 2019-04-21 NOTE — ED Notes (Signed)
VOl/ SOC completed/ Recommend Inpt Admit

## 2019-04-21 NOTE — BH Assessment (Addendum)
Assessment Note  Christian [name] is an 49 y.o. male presenting to [facility] ED voluntarily. Per triage note Pt lives with mother and sts, "I got tired of my moms mouth today and took all of my pills." Pt reports he has taken 6-8 Trazodone and 6-8 Mirtazapine since 10:30 this AM. Pt admits to SI and denies HI. Pt sts, "I came here cause I ran out of pills to take." During assessment patient was alert and oriented x4, pleasant and cooperative. Patient reported why he was presenting to the ED "I couldn't take my mom's mouth anymore, I haven't been able to find a job because I can't stand on my feet for too long, I took a bunch of sleeping pills hoping to not wake up." Patient reported that he has attempted 1 other time in October of 2020, patient reported "I wanted to hurt myself with a gun last time." Patient reported he was admitted to [facility] BMU due to his attempt. 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 Patient reports current SI with a plan and denies HI/AH/VH, patient does not appear to be responding to any external or internal stimuli.  Patient will be seen by [facility] to determine disposition.   Per [facility] Dr. Roxy Manns patient is recommended for Inpatient Hospitalization  Diagnosis: Major Depressive Disorder, recurrent episode, severe  Past Medical History:  Past Medical History:  Diagnosis Date  . CAD (coronary artery disease)    a. 07/2013 NSTEMI/PCI: LCX 16m (4.0x23 Xience DES), RCA 69m, EF > 55%;  b.  07/2013 Echo: EF 60-65%, diast dysfxn, mild LVH, mildly dil LA, nl RV size/fxn, nl RVSP.  Marland Kitchen History of tobacco abuse    a. quit ~ 2010  . Hyperlipidemia   . Morbid obesity ([place])    a. weighed 168 @ age 51.  . Scoliosis     Past Surgical History:  Procedure Laterality Date  . ADENOIDECTOMY    . CARDIAC CATHETERIZATION  07/2013   [facility]'x1 stent    Family History:  Family History  Problem Relation Age of Onset  . Heart disease Mother   . Esophageal cancer Father     Social History:  reports that he has quit smoking. His smoking use included cigarettes. He has a 10.00 pack-year smoking history. He has never used smokeless tobacco. He reports current alcohol use. He reports that he does not use drugs.  Additional Social History:  Alcohol / Drug Use Pain Medications: see MAR Prescriptions: see MAR Over the Counter: see MAR History of alcohol / drug use?: No history of alcohol / drug abuse  CIWA: CIWA-Ar BP: (!) 153/102 Pulse Rate: (!) 113 COWS:    Allergies: No Known Allergies  Home Medications: (Not in a hospital admission)   OB/GYN Status:  No LMP for male patient.  General Assessment Data Location of Assessment: [facility] ED TTS Assessment: In system Is this a Tele or Face-to-Face Assessment?: Face-to-Face Is this an Initial Assessment or a Re-assessment  for this encounter?: Initial Assessment Patient Accompanied by:: N/A Language Other than English: No Living Arrangements: Other (Comment)(Private Residence with mother) What gender do you identify as?: Male Marital status: Single Living Arrangements: Parent Can pt return to current living arrangement?: Yes Admission Status: Voluntary Is patient capable of signing voluntary admission?: Yes Referral Source: Self/Family/Friend Insurance type: None  Medical Screening Exam ([place]) Medical Exam completed: Yes  Crisis Care Plan Living Arrangements: Parent Legal Guardian: Other:(Self) Name of Psychiatrist:  None Name of Therapist: None  Education Status Is patient currently in school?: No Is the patient employed, unemployed or receiving disability?: Unemployed  Risk to self with the past 6 months Suicidal Ideation: Yes-Currently Present Has patient been a risk to self within the past 6 months prior to admission? : Yes Suicidal Intent: Yes-Currently Present Has patient had any suicidal intent within the past 6 months prior to admission? : Yes Is patient at risk for suicide?: Yes Suicidal Plan?: Yes-Currently Present Has patient had any suicidal plan within the past 6 months prior to admission? : Yes Specify Current Suicidal Plan: "to jump off a bridge" Access to Means: Yes Specify Access to Suicidal Means: Access to a highway What has been your use of drugs/alcohol within the last 12 months?: None Previous Attempts/Gestures: Yes How many times?: 1 Triggers for Past Attempts: Other (Comment)(Family conflict) Intentional Self Injurious Behavior: None Family Suicide History: No Recent stressful life event(s): Other (Comment)(Current caregiver of mother) Persecutory voices/beliefs?: No Depression: Yes Depression Symptoms: Isolating, Loss of interest in usual pleasures, Feeling worthless/self pity Substance abuse history and/or treatment for substance abuse?: No Suicide prevention information given to non-admitted patients: Not applicable  Risk to Others within the past 6 months Homicidal Ideation: No Does patient have any lifetime risk of violence toward others beyond the six months prior to admission? : No Thoughts of Harm to Others: No Current Homicidal Intent: No Current Homicidal Plan: No Access to Homicidal Means: No History of harm to others?: No Assessment of Violence: None Noted Does patient have access to weapons?: No Criminal Charges Pending?: No Does patient have a court date: No Is patient on probation?: No  Psychosis Hallucinations: None noted Delusions: None  noted  Mental Status Report Appearance/Hygiene: In scrubs Eye Contact: Good Motor Activity: Freedom of movement Speech: Logical/coherent Level of Consciousness: Alert Mood: Depressed, Sad, Irritable Affect: Depressed, Sad Anxiety Level: Minimal Thought Processes: Coherent Judgement: Unimpaired Orientation: Person, Place, Time, Situation, Appropriate for developmental age Obsessive Compulsive Thoughts/Behaviors: None  Cognitive Functioning Concentration: Normal Memory: Recent Intact, Remote Intact Is patient IDD: No Insight: Good Impulse Control: Fair Appetite: Poor Have you had any weight changes? : No Change Sleep: Decreased Total Hours of Sleep: 0 Vegetative Symptoms: None  ADLScreening [facility] Assessment Services) Patient's cognitive ability adequate to safely complete daily activities?: Yes Patient able to express need for assistance with ADLs?: Yes Independently performs ADLs?: Yes (appropriate for developmental age)  Prior Inpatient Therapy Prior Inpatient Therapy: Yes Prior Therapy Dates: 12/2018 Prior Therapy Facilty/Provider(s): [facility] BMU Reason for Treatment: Suicide attempt  Prior Outpatient Therapy Prior Outpatient Therapy: No Does patient have an ACCT team?: No Does patient have Intensive In-House Services?  : No Does patient have [facility] services? : No Does patient have P4CC services?: No  ADL Screening (condition at time of admission) Patient's cognitive ability adequate to safely complete daily activities?: Yes Is the patient deaf or have difficulty hearing?: No Does the patient have difficulty seeing, even when wearing glasses/contacts?: No Does the  patient have difficulty concentrating, remembering, or making decisions?: No Patient able to express need for assistance with ADLs?: Yes Does the patient have difficulty dressing or bathing?: No Independently performs ADLs?: Yes (appropriate for developmental age) Does the patient have difficulty walking  or climbing stairs?: No Weakness of Legs: None Weakness of Arms/Hands: None  Home Assistive Devices/Equipment Home Assistive Devices/Equipment: None  Therapy Consults (therapy consults require a physician order) PT Evaluation Needed: No OT Evalulation Needed: No SLP Evaluation Needed: No Abuse/Neglect Assessment (Assessment to be complete while patient is alone) Abuse/Neglect Assessment Can Be Completed: Yes Physical Abuse: Denies Verbal Abuse: Denies Sexual Abuse: Denies Exploitation of patient/patient's resources: Denies Self-Neglect: Denies Values / Beliefs Cultural Requests During Hospitalization: None Spiritual Requests During Hospitalization: None Consults Spiritual Care Consult Needed: No Transition of Care Team Consult Needed: No            Disposition:  Per SOC Dr. Roxy Manns patient is recommended for Inpatient Hospitalization Disposition Initial Assessment Completed for this Encounter: Yes  On Site Evaluation by:   Reviewed with Physician:    Leonie Douglas MS LCAS-A 04/21/2019 12:23 AM

## 2019-04-21 NOTE — BH Assessment (Signed)
Patient is to be admitted to [facility] BMU by Dr. Ainsley Spinner.  Attending Physician will be Dr. Weber Cooks.   Patient has been assigned to room 305, by [place].   Intake Paper Work has been signed and placed on patient chart.  ER staff is aware of the admission:  Sherry Ruffing, ER Secretary    Dr. Archie Balboa, ER MD   Judson Roch, Patient's Nurse   Lidia Collum, Patient Access

## 2019-04-21 NOTE — Progress Notes (Signed)
Admission Note  Report from [facility] ER   D: Pt appeared depressed  With  a flat affect.  Pt  denies SI / AVH at this time. Patient presented to emergency room  With overdose . Patient had calculated taking sleeping medication to overdose throughout the day . Patient attempted to overdose again  In October 2020 and jump off a bridge . Periods of tearfulness.patient has not been sleeping and appetite has decreased. Patient stated he was tired of taking care of his mother of  Five years. . Stated things had gotten progressively worse . Patient mother has dementia . Patient voice of his mother having mood swings  From nice  To  Mean. " Im tired of my mother's lip" Patient  recently had a stroke in December . Voice he cant get a job . 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    Pt is redirectable and cooperative with assessment.      A: Pt admitted to unit per protocol, skin assessment and search done and no contraband found with [place] representative.  Pt  educated on therapeutic milieu rules. Pt was introduced to milieu by nursing staff.    R: Pt was receptive to education about the milieu .  15 min safety checks started. Probation officer offered support

## 2019-04-21 NOTE — ED Notes (Signed)
This RN updated [place] Poison Control who states they will close out patients case at this time.

## 2019-04-21 NOTE — ED Notes (Signed)
Report given to [facility] Dr Ricard Dillon. Pt taken to consult room for interview.

## 2019-04-21 NOTE — Plan of Care (Signed)
New admission  not progressing as yet  Problem: Education: Goal: Knowledge of [facility] Education information/materials will improve 04/21/2019 1718 by Leodis Liverpool, RN Outcome: Not Progressing 04/21/2019 1718 by Leodis Liverpool, RN Outcome:   Problem: Coping: Goal: Ability to verbalize frustrations and anger appropriately will improve 04/21/2019 1718 by Leodis Liverpool, RN Outcome: Not Progressing 04/21/2019 1718 by Leodis Liverpool, RN Outcome: Goal: Ability to demonstrate self-control will improve 04/21/2019 1718 by Leodis Liverpool, RN Outcome: Not Progressing 04/21/2019 1718 by Leodis Liverpool, RN Outcome:   Problem: Safety: Goal: Periods of time without injury will increase 04/21/2019 1718 by Leodis Liverpool, RN Outcome: Not Progressing 04/21/2019 1718 by Leodis Liverpool, RN Outcome:   Problem: Activity: Goal: Imbalance in normal sleep/wake cycle will improve 04/21/2019 1718 by Leodis Liverpool, RN Outcome: Not Progressing 04/21/2019 1718 by Leodis Liverpool, RN     Problem: Safety: Goal: Periods of time without injury will increase 04/21/2019 1718 by Leodis Liverpool, RN Outcome: Not Progressing 04/21/2019 1718 by Leodis Liverpool, RN Outcome: Progressing   Problem: Health Behavior/Discharge Planning: Goal: Ability to make decisions will improve 04/21/2019 1718 by Leodis Liverpool, RN Outcome: Not Progressing 04/21/2019 1718 by Leodis Liverpool, RN    Problem: Self-Concept: Goal: Ability to disclose and discuss suicidal ideas will improve 04/21/2019 1718 by Leodis Liverpool, RN Outcome: Not Progressing 04/21/2019 1718 by Leodis Liverpool, RN Outcome: Goal: Will verbalize positive feelings about self 04/21/2019 1718 by Leodis Liverpool, RN Outcome: Not Progressing 04/21/2019 1718 by Leodis Liverpool, RN Outcome: Problem: Education: Goal: Ability to state activities that reduce stress will improve 04/21/2019 1718 by Leodis Liverpool, RN Outcome: Not Progressing 04/21/2019  1718 by Leodis Liverpool, RN e:   Problem: Self-Concept: Goal: Ability to modify response to factors that promote anxiety will improve 04/21/2019 1718 by Leodis Liverpool, RN Outcome: Not Progressing 04/21/2019 1718 by Leodis Liverpool, RN

## 2019-04-21 NOTE — ED Notes (Signed)
Meal tray given 

## 2019-04-22 DIAGNOSIS — F332 Major depressive disorder, recurrent severe without psychotic features: Secondary | ICD-10-CM

## 2019-04-22 DIAGNOSIS — F1021 Alcohol dependence, in remission: Secondary | ICD-10-CM

## 2019-04-22 LAB — HEPATIC FUNCTION PANEL
ALT: 21 U/L (ref 0–44)
AST: 20 U/L (ref 15–41)
Albumin: 4.8 g/dL (ref 3.5–5.0)
Alkaline Phosphatase: 70 U/L (ref 38–126)
Bilirubin, Direct: 0.2 mg/dL (ref 0.0–0.2)
Indirect Bilirubin: 0.8 mg/dL (ref 0.3–0.9)
Total Bilirubin: 1 mg/dL (ref 0.3–1.2)
Total Protein: 7.9 g/dL (ref 6.5–8.1)

## 2019-04-22 LAB — HEMOGLOBIN A1C
Hgb A1c MFr Bld: 9.6 % — ABNORMAL HIGH (ref 4.8–5.6)
Mean Plasma Glucose: 228.82 mg/dL

## 2019-04-22 LAB — LIPID PANEL
Cholesterol: 218 mg/dL — ABNORMAL HIGH (ref 0–200)
HDL: 38 mg/dL — ABNORMAL LOW (ref >40)
LDL Cholesterol: 110 mg/dL — ABNORMAL HIGH (ref 0–99)
Total CHOL/HDL Ratio: 5.7 RATIO
Triglycerides: 352 mg/dL — ABNORMAL HIGH (ref <150)
VLDL: 70 mg/dL — ABNORMAL HIGH (ref 0–40)

## 2019-04-22 LAB — TSH: TSH: 2.72 u[IU]/mL (ref 0.350–4.500)

## 2019-04-22 MED ORDER — GABAPENTIN 100 MG PO CAPS
100.0000 mg | ORAL_CAPSULE | Freq: Three times a day (TID) | ORAL | Status: DC
  Administered 2019-04-22 – 2019-04-25 (×9): 100 mg via ORAL
  Filled 2019-04-22 (×9): qty 1

## 2019-04-22 NOTE — Progress Notes (Signed)
Recreation Therapy Notes   Date: 04/22/2019  Time: 9:30 am  Location: Craft room  Behavioral response: Appropriate  Intervention Topic: Relaxation   Discussion/Intervention:  Group content today was focused on relaxation. The group defined relaxation and identified healthy ways to relax. Individuals expressed how much time they spend relaxing. Patients expressed how much their life would be if they did not make time for themselves to relax. The group stated ways they could improve their relaxation techniques in the future.  Individuals participated in the intervention "Time to Relax" where they had a chance to experience different relaxation techniques.  Clinical Observations/Feedback:  Patient came to group late due to unknown reasons. Individual was social with peers and staff while participating in the intervention during group. Participant was pulled from group by peer support and did not returned.  Olivette Beckmann LRT/CTRS         Marquasia Schmieder 04/22/2019 12:17 PM

## 2019-04-22 NOTE — BHH Suicide Risk Assessment (Signed)
[place] INPATIENT:  Family/Significant Other Suicide Prevention Education  Suicide Prevention Education:  Patient Refusal for Family/Significant Other Suicide Prevention Education: The patient Christian [name] has refused to provide written consent for family/significant other to be provided Family/Significant Other Suicide Prevention Education during admission and/or prior to discharge.  Physician notified.  Aireanna Luellen T Coralee Edberg 04/22/2019, 10:46 AM

## 2019-04-22 NOTE — BHH Suicide Risk Assessment (Signed)
[facility] Admission Suicide Risk Assessment   Nursing information obtained from:  Patient Demographic factors:  Male Current Mental Status:  Suicidal ideation indicated by patient Loss Factors:  Financial problems / change in socioeconomic status Historical Factors:  NA Risk Reduction Factors:  Sense of responsibility to family, Positive coping skills or problem solving skills  Total Time spent with patient: 1 hour Principal Problem: MDD (major depressive disorder) Diagnosis:  Principal Problem:   MDD (major depressive disorder) Active Problems:   CAD (coronary artery disease)   HTN (hypertension)   Hyperlipidemia   Diabetes mellitus, type II (HCC)   Alcohol dependence in early, early partial, sustained full, or sustained partial remission ([place])  Subjective Data: Patient seen chart reviewed.  Patient familiar from previous encounters.  49 year old man with chronic depression and alcohol abuse in remission who came to the emergency room after taking an overdose of some of his prescription medicine.  Patient currently denies any acute intent to kill himself or wish to be dead but has chronic depressed mood.  Cooperative with treatment.  Continued Clinical Symptoms:  Alcohol Use Disorder Identification Test Final Score (AUDIT): 1 The "Alcohol Use Disorders Identification Test", Guidelines for Use in Primary Care, Second Edition.  World Pharmacologist [facility]). Score between 0-7:  no or low risk or alcohol related problems. Score between 8-15:  moderate risk of alcohol related problems. Score between 16-19:  high risk of alcohol related problems. Score 20 or above:  warrants further diagnostic evaluation for alcohol dependence and treatment.   CLINICAL FACTORS:   Depression:   Impulsivity Medical Diagnoses and Treatments/Surgeries   Musculoskeletal: Strength & Muscle Tone: within normal limits Gait & Station: normal Patient leans: N/A  Psychiatric Specialty Exam: Physical Exam   Nursing note and vitals reviewed. Constitutional: He appears well-developed and well-nourished.  HENT:  Head: Normocephalic and atraumatic.  Eyes: Pupils are equal, round, and reactive to light. Conjunctivae are normal.  Cardiovascular: Regular rhythm and normal heart sounds.  Respiratory: Effort normal. No respiratory distress.  GI: Soft.  Musculoskeletal:        General: Normal range of motion.     Cervical back: Normal range of motion.  Neurological: He is alert.  Skin: Skin is warm and dry.  Psychiatric: His speech is normal and behavior is normal. Thought content normal. Cognition and memory are normal. He expresses impulsivity. He exhibits a depressed mood.    Review of Systems  Constitutional: Negative.   HENT: Negative.   Eyes: Negative.   Respiratory: Negative.   Cardiovascular: Negative.   Gastrointestinal: Negative.   Musculoskeletal: Negative.   Skin: Negative.   Neurological: Negative.   Psychiatric/Behavioral: Positive for dysphoric mood.    Blood pressure (!) 141/95, pulse 75, temperature 99 F (37.2 C), temperature source Oral, resp. rate 18, height 5\' 6"  (1.676 m), weight 77.6 kg, SpO2 100 %.Body mass index is 27.6 kg/m.  General Appearance: Casual  Eye Contact:  Good  Speech:  Clear and Coherent  Volume:  Normal  Mood:  Dysphoric  Affect:  Congruent  Thought Process:  Goal Directed  Orientation:  Full (Time, Place, and Person)  Thought Content:  Logical  Suicidal Thoughts:  No  Homicidal Thoughts:  No  Memory:  Immediate;   Fair Recent;   Fair Remote;   Fair  Judgement:  Fair  Insight:  Fair  Psychomotor Activity:  Decreased  Concentration:  Concentration: Fair  Recall:  [employer] of Knowledge:  Fair  Language:  Fair  Akathisia:  No  Handed:  Right  AIMS (if indicated):     Assets:  Desire for Improvement Housing Resilience Social Support  ADL's:  Intact  Cognition:  WNL  Sleep:  Number of Hours: 7.45      COGNITIVE FEATURES THAT  CONTRIBUTE TO RISK:  Loss of executive function    SUICIDE RISK:   Mild:  Suicidal ideation of limited frequency, intensity, duration, and specificity.  There are no identifiable plans, no associated intent, mild dysphoria and related symptoms, good self-control (both objective and subjective assessment), few other risk factors, and identifiable protective factors, including available and accessible social support.  PLAN OF CARE: Continue 15-minute checks.  Continue treatment for depression.  Stabilize medical conditions.  Engage in groups.  Reassess suicidality before arranging for discharge  I certify that inpatient services furnished can reasonably be expected to improve the patient's condition.   Alethia Berthold, MD 04/22/2019, 10:45 AM

## 2019-04-22 NOTE — Plan of Care (Signed)
Patient compliant with procedures on the unit.   Problem: Education: Goal: Knowledge of [facility] Education information/materials will improve Outcome: Progressing

## 2019-04-22 NOTE — Progress Notes (Signed)
Patient was in the day room upon arrival to the unit. Patient presents with sad and depressed affect. Patient denies SI/HI/AVH and pain. Patient endorses anxiety and depression rating them 7/10. Patient given support and encouragement to be active in his treatment plan. Patient observed interacting appropriately with staff and peers on the unit. Patient being monitored Q 15 minutes for safety per unit protocol. Patient remains safe on the unit.

## 2019-04-22 NOTE — BHH Group Notes (Signed)
Balance In Life 04/22/2019 1PM  Type of Therapy/Topic:  Group Therapy:  Balance in Life  Participation Level:  Minimal  Description of Group:   This group will address the concept of balance and how it feels and looks when one is unbalanced. Patients will be encouraged to process areas in their lives that are out of balance and identify reasons for remaining unbalanced. Facilitators will guide patients in utilizing problem-solving interventions to address and correct the stressor making their life unbalanced. Understanding and applying boundaries will be explored and addressed for obtaining and maintaining a balanced life. Patients will be encouraged to explore ways to assertively make their unbalanced needs known to significant others in their lives, using other group members and facilitator for support and feedback.  Therapeutic Goals: 1. Patient will identify two or more emotions or situations they have that consume much of in their lives. 2. Patient will identify signs/triggers that life has become out of balance:  3. Patient will identify two ways to set boundaries in order to achieve balance in their lives:  4. Patient will demonstrate ability to communicate their needs through discussion and/or role plays  Summary of Patient Progress: Minimal participation, no input provider. However, pt did work on completing group activity. Pt respected boundaries during session.   Therapeutic Modalities:   Cognitive Behavioral Therapy Solution-Focused Therapy Assertiveness Training  Mahalia Dykes Lynelle Smoke, LCSW

## 2019-04-22 NOTE — Tx Team (Addendum)
Interdisciplinary Treatment and Diagnostic Plan Update  04/22/2019 Time of Session: 9am Christian [name] MRN: [id number]  Principal Diagnosis: <principal problem not specified>  Secondary Diagnoses: Active Problems:   MDD (major depressive disorder)   Current Medications:  Current Facility-Administered Medications  Medication Dose Route Frequency Provider Last Rate Last Admin  . acetaminophen (TYLENOL) tablet 650 mg  650 mg Oral Q6H PRN Cristofano, Dorene Ar, MD      . alum & mag hydroxide-simeth (MAALOX/MYLANTA) 200-200-20 MG/5ML suspension 30 mL  30 mL Oral Q4H PRN Cristofano, Paul A, MD      . aspirin chewable tablet 81 mg  81 mg Oral Daily Cristofano, Dorene Ar, MD   81 mg at 04/22/19 0828  . atorvastatin (LIPITOR) tablet 80 mg  80 mg Oral q1800 Cristofano, Dorene Ar, MD   80 mg at 04/21/19 1804  . carvedilol (COREG) tablet 25 mg  25 mg Oral BID WC Cristofano, Dorene Ar, MD   25 mg at 04/22/19 0828  . losartan (COZAAR) tablet 100 mg  100 mg Oral Daily Cristofano, Dorene Ar, MD   100 mg at 04/22/19 0828  . magnesium hydroxide (MILK OF MAGNESIA) suspension 30 mL  30 mL Oral Daily PRN Cristofano, Paul A, MD      . metFORMIN (GLUCOPHAGE) tablet 1,000 mg  1,000 mg Oral BID WC Cristofano, Dorene Ar, MD   1,000 mg at 04/22/19 9233   PTA Medications: Medications Prior to Admission  Medication Sig Dispense Refill Last Dose  . aspirin 81 MG chewable tablet Chew 1 tablet (81 mg total) by mouth daily. 30 tablet 1   . atorvastatin (LIPITOR) 80 MG tablet Take 1 tablet (80 mg total) by mouth daily at 6 PM. 30 tablet 0   . canagliflozin (INVOKANA) 100 MG TABS tablet Take 1 tablet (100 mg total) by mouth daily before breakfast. 30 tablet 0   . carvedilol (COREG) 25 MG tablet Take 1 tablet (25 mg total) by mouth 2 (two) times daily with a meal. 60 tablet 1   . gabapentin (NEURONTIN) 100 MG capsule Take 2 capsules (200 mg total) by mouth 3 (three) times daily. 180 capsule 1   . losartan (COZAAR) 100 MG tablet Take 1  tablet (100 mg total) by mouth daily. 30 tablet 1   . metFORMIN (GLUCOPHAGE) 1000 MG tablet Take 1 tablet (1,000 mg total) by mouth 2 (two) times daily with a meal. 60 tablet 1   . mirtazapine (REMERON) 30 MG tablet Take 1 tablet (30 mg total) by mouth at bedtime. 30 tablet 1   . traZODone (DESYREL) 50 MG tablet Take 1 tablet (50 mg total) by mouth at bedtime as needed for sleep. (Patient taking differently: Take 50 mg by mouth at bedtime. ) 30 tablet 1     Patient Stressors: Financial difficulties Health problems Marital or family conflict  Patient Strengths: Ability for insight Active sense of humor Average or above average intelligence Capable of independent living Communication skills  Treatment Modalities: Medication Management, Group therapy, Case management,  1 to 1 session with clinician, Psychoeducation, Recreational therapy.   Physician Treatment Plan for Primary Diagnosis: <principal problem not specified> Long Term Goal(s):     Short Term Goals:    Medication Management: Evaluate patient's response, side effects, and tolerance of medication regimen.  Therapeutic Interventions: 1 to 1 sessions, Unit Group sessions and Medication administration.  Evaluation of Outcomes: Not Met  Physician Treatment Plan for Secondary Diagnosis: Active Problems:   MDD (major depressive disorder)  Long Term Goal(s):     Short Term Goals:       Medication Management: Evaluate patient's response, side effects, and tolerance of medication regimen.  Therapeutic Interventions: 1 to 1 sessions, Unit Group sessions and Medication administration.  Evaluation of Outcomes: Not Met   RN Treatment Plan for Primary Diagnosis: <principal problem not specified> Long Term Goal(s): Knowledge of disease and therapeutic regimen to maintain health will improve  Short Term Goals: Ability to participate in decision making will improve, Ability to verbalize feelings will improve, Ability to disclose  and discuss suicidal ideas, Ability to identify and develop effective coping behaviors will improve and Compliance with prescribed medications will improve  Medication Management: RN will administer medications as ordered by provider, will assess and evaluate patient's response and provide education to patient for prescribed medication. RN will report any adverse and/or side effects to prescribing provider.  Therapeutic Interventions: 1 on 1 counseling sessions, Psychoeducation, Medication administration, Evaluate responses to treatment, Monitor vital signs and CBGs as ordered, Perform/monitor CIWA, COWS, AIMS and Fall Risk screenings as ordered, Perform wound care treatments as ordered.  Evaluation of Outcomes: Not Met   LCSW Treatment Plan for Primary Diagnosis: <principal problem not specified> Long Term Goal(s): Safe transition to appropriate next level of care at discharge, Engage patient in therapeutic group addressing interpersonal concerns.  Short Term Goals: Engage patient in aftercare planning with referrals and resources  Therapeutic Interventions: Assess for all discharge needs, 1 to 1 time with Social worker, Explore available resources and support systems, Assess for adequacy in community support network, Educate family and significant other(s) on suicide prevention, Complete Psychosocial Assessment, Interpersonal group therapy.  Evaluation of Outcomes: Not Met   Progress in Treatment: Attending groups: No. Participating in groups: No. Taking medication as prescribed: Yes. Toleration medication: Yes. Family/Significant other contact made: Yes, individual(s) contacted:  pt declined family contact Patient understands diagnosis: Yes. Discussing patient identified problems/goals with staff: Yes. Medical problems stabilized or resolved: No. Denies suicidal/homicidal ideation: No. Issues/concerns per patient self-inventory: No. Other: NA  New problem(s) identified: No,  Describe:  None reported  New Short Term/Long Term Goal(s):Attend outpatient treatment,take medication as prescribed, develop and implement healthy coping methods  Patient Goals:  "Get over the thought of wanting to kill myself'  Discharge Plan or Barriers: Pt will return home and follow up at [facility] where he is an existing patient  Reason for Continuation of Hospitalization: Medication stabilization Suicidal ideation  Estimated Length of Stay:1-7 days   Recreational Therapy: Patient: N/A Patient Goal: Patient will engage in groups without prompting or encouragement from LRT x3 group sessions within 5 recreation therapy group sessions.  Attendees: Patient:Christian [name] 04/22/2019 9:53 AM  Physician: Alethia Berthold 04/22/2019 9:53 AM  Nursing: Marisue Brooklyn 04/22/2019 9:53 AM  RN Care Manager: 04/22/2019 9:53 AM  Social Worker: Sanjuana Kava [facility] [place] 04/22/2019 9:53 AM  Recreational Therapist: Roanna Epley 04/22/2019 9:53 AM  Other:  04/22/2019 9:53 AM  Other:  04/22/2019 9:53 AM  Other: 04/22/2019 9:53 AM    Scribe for Treatment Team: Yvette Rack, LCSW 04/22/2019 9:53 AM

## 2019-04-22 NOTE — H&P (Signed)
Psychiatric Admission Assessment Adult  Patient Identification: Christian [name] MRN:  [id number] Date of Evaluation:  04/22/2019 Chief Complaint:  MDD (major depressive disorder) [F32.9] Principal Diagnosis: MDD (major depressive disorder) Diagnosis:  Principal Problem:   MDD (major depressive disorder) Active Problems:   CAD (coronary artery disease)   HTN (hypertension)   Hyperlipidemia   Diabetes mellitus, type II ([place])   Alcohol dependence in early, early partial, sustained full, or sustained partial remission ([place])  History of Present Illness: Patient seen and chart reviewed.  Patient known from previous encounters.  49 year old man with a history of depression and alcohol abuse.  Came to the emergency room a couple days ago after overdosing on some of his prescription medicine.  Patient says that he and his mother got into a verbal confrontation and he felt that she was rude and hurtful to him.  This is a frequent occurrence in his situation at home.  He felt frustrated and took a few of his pills including trazodone and mirtazapine.  He tells me that he wanted to "just go to sleep" but admits that to some extent he did not want to wake up again.  He of course did wake up took some more pills but felt unsteady on his feet.  Apparently then his mother insulted him for being unsteady on his feet and that made him even more unhappy so he came to the emergency room.  Patient has chronic low mood depression and dissatisfaction with his life.  Usually sleeps adequately with his medicine.  Appetite okay.  Denies psychotic symptoms.  He is denying any acute suicidal ideation or intent right now denies homicidal ideation.  He reports that he has been compliant with his prescribed medicine and it is confirmed that he does go to his outpatient therapy.  As is typical for him Christian [name] focuses almost all of his complaints on his mother.  He lives with her in what can probably be accurately described as a  codependent situation that keeps him in a bad mood most of the time.  He denies that he has been drinking recently saying his last alcohol was 3 weeks ago when he had a relapse and drank a whole pint but since then has not had any more to drink. Associated Signs/Symptoms: Depression Symptoms:  depressed mood, fatigue, feelings of worthlessness/guilt, difficulty concentrating, hopelessness, suicidal attempt, (Hypo) Manic Symptoms:  Irritable Mood, Anxiety Symptoms:  Excessive Worry, Social Anxiety, Psychotic Symptoms:  None reported PTSD Symptoms: Negative Total Time spent with patient: 1 hour  Past Psychiatric History: Patient has a long history of depression.  He has had several hospitalizations including 1 as recently as November 2020 for almost identical circumstances.  He used to have a significant active alcohol problem but seems to have gotten his drinking under pretty good control.  He has been treated with antidepressants with only partial response.  No history of mania and no history of psychosis.  Has had several overdoses in another unsuccessful suicide attempts  Is the patient at risk to self? Yes.    Has the patient been a risk to self in the past 6 months? Yes.    Has the patient been a risk to self within the distant past? Yes.    Is the patient a risk to others? No.  Has the patient been a risk to others in the past 6 months? No.  Has the patient been a risk to others within the distant past? No.   Prior  Inpatient Therapy:   Prior Outpatient Therapy:    Alcohol Screening: 1. How often do you have a drink containing alcohol?: Monthly or less 2. How many drinks containing alcohol do you have on a typical day when you are drinking?: 1 or 2 3. How often do you have six or more drinks on one occasion?: Never AUDIT-C Score: 1 4. How often during the last year have you found that you were not able to stop drinking once you had started?: Never 5. How often during the last  year have you failed to do what was normally expected from you becasue of drinking?: Never 6. How often during the last year have you needed a first drink in the morning to get yourself going after a heavy drinking session?: Never 7. How often during the last year have you had a feeling of guilt of remorse after drinking?: Never 8. How often during the last year have you been unable to remember what happened the night before because you had been drinking?: Never 9. Have you or someone else been injured as a result of your drinking?: No 10. Has a relative or friend or a doctor or another health worker been concerned about your drinking or suggested you cut down?: No Alcohol Use Disorder Identification Test Final Score (AUDIT): 1 Alcohol Brief Interventions/Follow-up: AUDIT Score <7 follow-up not indicated Substance Abuse History in the last 12 months:  Yes.   Consequences of Substance Abuse: Medical Consequences:  Worsening of his mood and general self-care Previous Psychotropic Medications: Yes  Psychological Evaluations: Yes  Past Medical History:  Past Medical History:  Diagnosis Date  . CAD (coronary artery disease)    a. 07/2013 NSTEMI/PCI: LCX 74m (4.0x23 Xience DES), RCA 43m, EF > 55%;  b. 07/2013 Echo: EF 60-65%, diast dysfxn, mild LVH, mildly dil LA, nl RV size/fxn, nl RVSP.  Marland Kitchen History of tobacco abuse    a. quit ~ 2010  . Hyperlipidemia   . Morbid obesity ([place])    a. weighed 168 @ age 39.  . Scoliosis     Past Surgical History:  Procedure Laterality Date  . ADENOIDECTOMY    . CARDIAC CATHETERIZATION  07/2013   [facility]'x1 stent   Family History:  Family History  Problem Relation Age of Onset  . Heart disease Mother   . Esophageal cancer Father    Family Psychiatric  History: None reported Tobacco Screening:   Social History:  Social History   Substance and Sexual Activity  Alcohol Use Yes     Social History   Substance and Sexual Activity  Drug Use No  . Types:  Marijuana   Comment: past    Additional Social History:                           Allergies:  No Known Allergies Lab Results:  Results for orders placed or performed during the hospital encounter of 04/21/19 (from the past 48 hour(s))  Hepatic function panel     Status: None   Collection Time: 04/22/19  7:00 AM  Result Value Ref Range   Total Protein 7.9 6.5 - 8.1 g/dL   Albumin 4.8 3.5 - 5.0 g/dL   AST 20 15 - 41 U/L   ALT 21 0 - 44 U/L   Alkaline Phosphatase 70 38 - 126 U/L   Total Bilirubin 1.0 0.3 - 1.2 mg/dL   Bilirubin, Direct 0.2 0.0 - 0.2 mg/dL   Indirect Bilirubin  0.8 0.3 - 0.9 mg/dL    Comment: Performed at [facility], [address] [place], [place] [address]  Lipid panel     Status: Abnormal   Collection Time: 04/22/19  7:00 AM  Result Value Ref Range   Cholesterol 218 (H) 0 - 200 mg/dL   Triglycerides 352 (H) <150 mg/dL   HDL 38 (L) >40 mg/dL   Total CHOL/HDL Ratio 5.7 RATIO   VLDL 70 (H) 0 - 40 mg/dL   LDL Cholesterol 110 (H) 0 - 99 mg/dL    Comment:        Total Cholesterol/HDL:CHD Risk Coronary Heart Disease Risk Table                     Men   Women  1/2 Average Risk   3.4   3.3  Average Risk       5.0   4.4  2 X Average Risk   9.6   7.1  3 X Average Risk  23.4   11.0        Use the calculated Patient Ratio above and the CHD Risk Table to determine the patient's CHD Risk.        ATP III CLASSIFICATION (LDL):  <100     mg/dL   Optimal  100-129  mg/dL   Near or Above                    Optimal  130-159  mg/dL   Borderline  160-189  mg/dL   High  >190     mg/dL   Very High Performed at [facility], [address] [place], [place] [address]   TSH     Status: None   Collection Time: 04/22/19  7:00 AM  Result Value Ref Range   TSH 2.720 0.350 - 4.500 uIU/mL    Comment: Performed by a 3rd Generation assay with a functional sensitivity of <=0.01 uIU/mL. Performed at [facility], [address]  [place], [place] [address]     Blood Alcohol level:  Lab Results  Component Value Date   [facility] <10 04/20/2019   ETH <10 [id number]    Metabolic Disorder Labs:  Lab Results  Component Value Date   HGBA1C 8.7 (H) 02/23/2019   MPG 202.99 02/23/2019   MPG 223.08 02/05/2019   No results found for: PROLACTIN Lab Results  Component Value Date   CHOL 218 (H) 04/22/2019   TRIG 352 (H) 04/22/2019   HDL 38 (L) 04/22/2019   CHOLHDL 5.7 04/22/2019   VLDL 70 (H) 04/22/2019   LDLCALC 110 (H) 04/22/2019   LDLCALC 68 02/23/2019    Current Medications: Current Facility-Administered Medications  Medication Dose Route Frequency Provider Last Rate Last Admin  . acetaminophen (TYLENOL) tablet 650 mg  650 mg Oral Q6H PRN Cristofano, Dorene Ar, MD      . alum & mag hydroxide-simeth (MAALOX/MYLANTA) 200-200-20 MG/5ML suspension 30 mL  30 mL Oral Q4H PRN Cristofano, Paul A, MD      . aspirin chewable tablet 81 mg  81 mg Oral Daily Cristofano, Dorene Ar, MD   81 mg at 04/22/19 0828  . atorvastatin (LIPITOR) tablet 80 mg  80 mg Oral q1800 Cristofano, Dorene Ar, MD   80 mg at 04/21/19 1804  . carvedilol (COREG) tablet 25 mg  25 mg Oral BID WC Cristofano, Dorene Ar, MD   25 mg at 04/22/19 0828  . losartan (COZAAR) tablet 100 mg  100  mg Oral Daily Cristofano, Dorene Ar, MD   100 mg at 04/22/19 0828  . magnesium hydroxide (MILK OF MAGNESIA) suspension 30 mL  30 mL Oral Daily PRN Cristofano, Paul A, MD      . metFORMIN (GLUCOPHAGE) tablet 1,000 mg  1,000 mg Oral BID WC Cristofano, Dorene Ar, MD   1,000 mg at 04/22/19 [id number]   PTA Medications: Medications Prior to Admission  Medication Sig Dispense Refill Last Dose  . aspirin 81 MG chewable tablet Chew 1 tablet (81 mg total) by mouth daily. 30 tablet 1   . atorvastatin (LIPITOR) 80 MG tablet Take 1 tablet (80 mg total) by mouth daily at 6 PM. 30 tablet 0   . canagliflozin (INVOKANA) 100 MG TABS tablet Take 1 tablet (100 mg total) by mouth daily before breakfast. 30 tablet 0   .  carvedilol (COREG) 25 MG tablet Take 1 tablet (25 mg total) by mouth 2 (two) times daily with a meal. 60 tablet 1   . gabapentin (NEURONTIN) 100 MG capsule Take 2 capsules (200 mg total) by mouth 3 (three) times daily. 180 capsule 1   . losartan (COZAAR) 100 MG tablet Take 1 tablet (100 mg total) by mouth daily. 30 tablet 1   . metFORMIN (GLUCOPHAGE) 1000 MG tablet Take 1 tablet (1,000 mg total) by mouth 2 (two) times daily with a meal. 60 tablet 1   . mirtazapine (REMERON) 30 MG tablet Take 1 tablet (30 mg total) by mouth at bedtime. 30 tablet 1   . traZODone (DESYREL) 50 MG tablet Take 1 tablet (50 mg total) by mouth at bedtime as needed for sleep. (Patient taking differently: Take 50 mg by mouth at bedtime. ) 30 tablet 1     Musculoskeletal: Strength & Muscle Tone: within normal limits Gait & Station: normal Patient leans: N/A  Psychiatric Specialty Exam: Physical Exam  Nursing note and vitals reviewed. Constitutional: He appears well-developed and well-nourished.  HENT:  Head: Normocephalic and atraumatic.  Eyes: Pupils are equal, round, and reactive to light. Conjunctivae are normal.  Cardiovascular: Regular rhythm and normal heart sounds.  Respiratory: Effort normal. No respiratory distress.  GI: Soft.  Musculoskeletal:        General: Normal range of motion.     Cervical back: Normal range of motion.  Neurological: He is alert.  Skin: Skin is warm and dry.  Psychiatric: His speech is normal and behavior is normal. Thought content normal. His affect is blunt. Cognition and memory are normal. He expresses impulsivity.    Review of Systems  Constitutional: Negative.   HENT: Negative.   Eyes: Negative.   Respiratory: Negative.   Cardiovascular: Negative.   Gastrointestinal: Negative.   Musculoskeletal: Negative.   Skin: Negative.   Neurological: Negative.   Psychiatric/Behavioral: Positive for dysphoric mood.    Blood pressure (!) 141/95, pulse 75, temperature 99 F (37.2  C), temperature source Oral, resp. rate 18, height 5\' 6"  (1.676 m), weight 77.6 kg, SpO2 100 %.Body mass index is 27.6 kg/m.  General Appearance: Casual  Eye Contact:  Good  Speech:  Slow  Volume:  Decreased  Mood:  Depressed  Affect:  Constricted  Thought Process:  Goal Directed  Orientation:  Full (Time, Place, and Person)  Thought Content:  Logical  Suicidal Thoughts:  No  Homicidal Thoughts:  No  Memory:  Immediate;   Fair Recent;   Fair Remote;   Fair  Judgement:  Fair  Insight:  Fair  Psychomotor Activity:  Normal  Concentration:  Concentration: Fair  Recall:  [employer] of Knowledge:  Fair  Language:  Fair  Akathisia:  No  Handed:  Right  AIMS (if indicated):     Assets:  Communication Skills Desire for Improvement Housing Resilience  ADL's:  Intact  Cognition:  WNL  Sleep:  Number of Hours: 7.45    Treatment Plan Summary: Daily contact with patient to assess and evaluate symptoms and progress in treatment, Medication management and Plan As far as his depression he will be continued on his usual outpatient medications.  He will be involved in groups.  He is receiving individual and group counseling and we are trying to focus on supporting his thoughts of moving out of the house.  Patient's blood pressure and diabetes are not under very good control and I will try to address those for better care given that he had a stroke last month.  Continue 15-minute checks.  Reassess dangerousness prior to discharge.  He has good outpatient treatment in place.  Observation Level/Precautions:  15 minute checks  Laboratory:  Chemistry Profile  Psychotherapy:    Medications:    Consultations:    Discharge Concerns:    Estimated LOS:  Other:     Physician Treatment Plan for Primary Diagnosis: MDD (major depressive disorder) Long Term Goal(s): Improvement in symptoms so as ready for discharge  Short Term Goals: Ability to verbalize feelings will improve and Ability to  demonstrate self-control will improve  Physician Treatment Plan for Secondary Diagnosis: Principal Problem:   MDD (major depressive disorder) Active Problems:   CAD (coronary artery disease)   HTN (hypertension)   Hyperlipidemia   Diabetes mellitus, type II ([facility])   Alcohol dependence in early, early partial, sustained full, or sustained partial remission ([place])  Long Term Goal(s): Improvement in symptoms so as ready for discharge  Short Term Goals: Ability to disclose and discuss suicidal ideas, Ability to maintain clinical measurements within normal limits will improve and Compliance with prescribed medications will improve  I certify that inpatient services furnished can reasonably be expected to improve the patient's condition.    Alethia Berthold, MD 1/28/202110:52 AM

## 2019-04-22 NOTE — Progress Notes (Signed)
Patient was in the day room upon arrival to the unit. Patient presents with sad and depressed affect. Patient denies SI/HI/AVH and pain. Patient endorses anxiety and depression rating them 6/10. Patient given support and encouragement to be active in his treatment plan. Patient observed interacting appropriately with staff and peers on the unit. Patient being monitored Q 15 minutes for safety per unit protocol. Patient remains safe on the unit.

## 2019-04-22 NOTE — BHH Counselor (Signed)
Adult Comprehensive Assessment  Patient ID: Christian [name], male   DOB: 02/18/1971, 49 y.o.   MRN: [id number]  Information Source:   Information source: Patient. Update-last seen at [facility] November 2020   Current Stressors:  Patient states their primary concerns and needs for treatment are:: Pt reports his mother has dementia. Pt states "Im overwhelmed by momma running her mouth" Pt report overdose attempt on rx medication. Patient states their goals for this hospitalization and ongoing recovery are:: "Get over the thought of killing myself" Educational / Learning stressors: Pt denies. Employment / Job issues: Unemployed, reports he is not able to stand for long periods of time Family Relationships: Pt reports a stressful relationship with his mother. Financial / Lack of resources (include bankruptcy): Pt reports no income. Housing / Lack of housing: Pt reports that he lives with his mother, and is her caretaker. Physical health (include injuries & life threatening diseases): Pt reports scoliosis, diabetes and neuropathy in feet. Pt states having had a stroke in November 2020. Social relationships: Pt reports he does not have any friends Substance abuse: Pt denies use and reports he has not had alcohol in 3 weeks Bereavement / Loss: Pt denies.   Living/Environment/Situation:  Living Arrangements: Parent Living conditions (as described by patient or guardian): Pt reports frequent arguments between him and his mother. Who else lives in the home?: Mother How long has patient lived in current situation?: 7 years What is atmosphere in current home: Chaotic   Family History:  Marital status: Single Are you sexually active?: No What is your sexual orientation?: heterosexual Has your sexual activity been affected by drugs, alcohol, medication, or emotional stress?: no Does patient have children?: Yes How many children?: 2 How is patient's relationship with their children?: Pt reports 6 yr  old daughter, 43 yr old son; states he does not talk to his children often   Childhood History:  By whom was/is the patient raised?: Both parents Additional childhood history information: Father deceased. Description of patient's relationship with caregiver when they were a child: Pt reports "good". Patient's description of current relationship with people who raised him/her: Pt reports conflict with his mother. How were you disciplined when you got in trouble as a child/adolescent?: Pt reports that there wasn't any. Does patient have siblings?: Yes Number of Siblings: 2 Description of patient's current relationship with siblings: Pt reports "good". Did patient suffer any verbal/emotional/physical/sexual abuse as a child?: No Did patient suffer from severe childhood neglect?: No Has patient ever been sexually abused/assaulted/raped as an adolescent or adult?: No Was the patient ever a victim of a crime or a disaster?: No Witnessed domestic violence?: No Has patient been effected by domestic violence as an adult?: No   Education:  Highest grade of school patient has completed: 12th Currently a student?: No Learning disability?: No   Employment/Work Situation:   Employment situation: Unemployed What is the longest time patient has a held a job?: 6 years 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 Are These Weapons Safely Secured?: N/A   Financial Resources:   Financial resources: No income Does patient have a Programmer, applications or guardian?: No   Alcohol/Substance Abuse:   What has been your use of drugs/alcohol within the last 12 months?: Pt denies use If attempted suicide, did drugs/alcohol  play a role in this?: No Alcohol/Substance Abuse Treatment Hx: Past Tx,  Outpatient Has alcohol/substance abuse ever caused legal problems?: No   Social Support System:   Pensions consultant Support System: Poor Describe Community Support System: Sister Type of faith/religion: Doctor, general practice:   Leisure and Hobbies: "I don't have any"   Strengths/Needs:   What is the patient's perception of their strengths?: "I don't know"   Discharge Plan:   Currently receiving community mental health services: Yes, [facility](individual, SA group, peer support) for 2.5 months Patient states concerns and preferences for aftercare planning are: Pt reports he would like to resume treatment with his current provider. Patient states they will know when they are safe and ready for discharge when: "Im worried about my dog" Does patient have access to transportation?: Yes, pt reports his car is located on campus Does patient have financial barriers related to discharge medications?: Yes, pt says he has Medicaid but no insurance listed Patient description of barriers related to discharge medications: No insurance listed.    Summary/Recommendations:   Summary and Recommendations (to be completed by the evaluator): ): Patient is a 49 yr old male with a history of major depressive disorder. Pt reports voluntarily coming into the hospital after overdose attempt on rx medication and ongoing conflict with his mother. Pt discussed feeling overwhelmed by taking care of his mother who has dementia. Pt denies any current drug or alcohol use. Pt endorsed SI, denies AH/VH or HI. Pt reports being followed by [facility] where he is receiving individual and group therapy and peer support service. Pt states he would like to resume treatment with his current provider. Recommendations include crisis stabilization, therapeutic milieu, encourage group attendance and participation, medication management for detox/mood stabilization and development of comprehensive mental wellness/sobriety plan.  Wen Munford Lynelle Smoke. 04/22/2019

## 2019-04-22 NOTE — Plan of Care (Signed)
D- Patient alert and oriented. Patient presents in a sad, but pleasant mood on assessment stating that he couldn't sleep last night because his mind was racing. Patient endorsed depression and anxiety, rating them both an "8/10", stating that "my momma and worried about my dog", is the reason why he's feeling this way. Patient denies SI, HI, AVH, and pain at this time. Patient's goal for today is "my thoughts in my mind", in which he will attend groups, in order to accomplish his goal.  A- Scheduled medications administered to patient, per MD orders. Support and encouragement provided.  Routine safety checks conducted every 15 minutes.  Patient informed to notify staff with problems or concerns.  R- No adverse drug reactions noted. Patient contracts for safety at this time. Patient compliant with medications and treatment plan. Patient receptive, calm, and cooperative. Patient interacts well with others on the unit.  Patient remains safe at this time.  Problem: Education: Goal: Knowledge of [place] [facility] Education information/materials will improve Outcome: Progressing   Problem: Coping: Goal: Ability to verbalize frustrations and anger appropriately will improve Outcome: Progressing Goal: Ability to demonstrate self-control will improve Outcome: Progressing   Problem: Safety: Goal: Periods of time without injury will increase Outcome: Progressing   Problem: Activity: Goal: Imbalance in normal sleep/wake cycle will improve Outcome: Progressing   Problem: Health Behavior/Discharge Planning: Goal: Ability to make decisions will improve Outcome: Progressing   Problem: Medication: Goal: Compliance with prescribed medication regimen will improve Outcome: Progressing   Problem: Self-Concept: Goal: Ability to disclose and discuss suicidal ideas will improve Outcome: Progressing Goal: Will verbalize positive feelings about self Outcome: Progressing   Problem: Education: Goal:  Ability to state activities that reduce stress will improve Outcome: Progressing   Problem: Self-Concept: Goal: Ability to modify response to factors that promote anxiety will improve Outcome: Progressing

## 2019-04-22 NOTE — Plan of Care (Signed)
Patient new to the unit. Hasn't had time to progress  Problem: Education: Goal: Knowledge of [place] [facility] Education information/materials will improve Outcome: Not Progressing   Problem: Coping: Goal: Ability to verbalize frustrations and anger appropriately will improve Outcome: Not Progressing Goal: Ability to demonstrate self-control will improve Outcome: Not Progressing   Problem: Safety: Goal: Periods of time without injury will increase Outcome: Not Progressing   Problem: Activity: Goal: Imbalance in normal sleep/wake cycle will improve Outcome: Not Progressing   Problem: Health Behavior/Discharge Planning: Goal: Ability to make decisions will improve Outcome: Not Progressing

## 2019-04-23 NOTE — Progress Notes (Signed)
[facility] MD Progress Note  04/23/2019 11:42 AM Christian [name]  MRN:  [id number]   Subjective: Follow-up for this 49 year old male diagnosed with MDD.  Patient reports that his depression is lessening today.  He denies having any suicidal thoughts or intent.  He denies having any homicidal ideations or any hallucinations.  Patient reports that he felt as though he slept better last night and his appetite is improving but is still only fair.  Patient states that he is trying to find another place to live once he discharges because he does not want to return to live with his mother.  He states that 2 possibilities that his has is 1 was with his sister that lives in town and the others with some friends that live at the [facility].  He reports that he has transportation to get him to either place and does not need assistance with transportation at this time.  Patient continues to state that he does not feel completely stable enough to discharge as of yet.  Principal Problem: MDD (major depressive disorder) Diagnosis: Principal Problem:   MDD (major depressive disorder) Active Problems:   CAD (coronary artery disease)   HTN (hypertension)   Hyperlipidemia   Diabetes mellitus, type II (HCC)   Alcohol dependence in early, early partial, sustained full, or sustained partial remission ([place])  Total Time spent with patient: 20 minutes  Past Psychiatric History: Patient has a long history of depression.  He has had several hospitalizations including 1 as recently as November 2020 for almost identical circumstances.  He used to have a significant active alcohol problem but seems to have gotten his drinking under pretty good control.  He has been treated with antidepressants with only partial response.  No history of mania and no history of psychosis.  Has had several overdoses in another unsuccessful suicide attempts  Past Medical History:  Past Medical History:  Diagnosis Date  . CAD (coronary artery  disease)    a. 07/2013 NSTEMI/PCI: LCX 39m (4.0x23 Xience DES), RCA 29m, EF > 55%;  b. 07/2013 Echo: EF 60-65%, diast dysfxn, mild LVH, mildly dil LA, nl RV size/fxn, nl RVSP.  Marland Kitchen History of tobacco abuse    a. quit ~ 2010  . Hyperlipidemia   . Morbid obesity ([place])    a. weighed 168 @ age 36.  . Scoliosis     Past Surgical History:  Procedure Laterality Date  . ADENOIDECTOMY    . CARDIAC CATHETERIZATION  07/2013   [facility]'x1 stent   Family History:  Family History  Problem Relation Age of Onset  . Heart disease Mother   . Esophageal cancer Father    Family Psychiatric  History: None reported Social History:  Social History   Substance and Sexual Activity  Alcohol Use Yes     Social History   Substance and Sexual Activity  Drug Use No  . Types: Marijuana   Comment: past    Social History   Socioeconomic History  . Marital status: Single    Spouse name: Not on file  . Number of children: Not on file  . Years of education: Not on file  . Highest education level: Not on file  Occupational History  . Not on file  Tobacco Use  . Smoking status: Former Smoker    Packs/day: 0.50    Years: 20.00    Pack years: 10.00    Types: Cigarettes  . Smokeless tobacco: Never Used  Substance and Sexual Activity  .  Alcohol use: Yes  . Drug use: No    Types: Marijuana    Comment: past  . Sexual activity: Not on file  Other Topics Concern  . Not on file  Social History Narrative  . Not on file   Social Determinants of Health   Financial Resource Strain:   . Difficulty of Paying Living Expenses: Not on file  Food Insecurity:   . Worried About Charity fundraiser in the Last Year: Not on file  . Ran Out of Food in the Last Year: Not on file  Transportation Needs:   . Lack of Transportation (Medical): Not on file  . Lack of Transportation (Non-Medical): Not on file  Physical Activity:   . Days of Exercise per Week: Not on file  . Minutes of Exercise per Session: Not on file   Stress:   . Feeling of Stress : Not on file  Social Connections:   . Frequency of Communication with Friends and Family: Not on file  . Frequency of Social Gatherings with Friends and Family: Not on file  . Attends Religious Services: Not on file  . Active Member of Clubs or Organizations: Not on file  . Attends Archivist Meetings: Not on file  . Marital Status: Not on file   Additional Social History:                         Sleep: Good  Appetite:  Fair  Current Medications: Current Facility-Administered Medications  Medication Dose Route Frequency Provider Last Rate Last Admin  . acetaminophen (TYLENOL) tablet 650 mg  650 mg Oral Q6H PRN Cristofano, Dorene Ar, MD      . alum & mag hydroxide-simeth (MAALOX/MYLANTA) 200-200-20 MG/5ML suspension 30 mL  30 mL Oral Q4H PRN Cristofano, Paul A, MD      . aspirin chewable tablet 81 mg  81 mg Oral Daily Cristofano, Dorene Ar, MD   81 mg at 04/23/19 0814  . atorvastatin (LIPITOR) tablet 80 mg  80 mg Oral q1800 Cristofano, Dorene Ar, MD   80 mg at 04/22/19 1710  . carvedilol (COREG) tablet 25 mg  25 mg Oral BID WC Cristofano, Dorene Ar, MD   25 mg at 04/23/19 0814  . gabapentin (NEURONTIN) capsule 100 mg  100 mg Oral TID Clapacs, Madie Reno, MD   100 mg at 04/23/19 0814  . losartan (COZAAR) tablet 100 mg  100 mg Oral Daily Cristofano, Dorene Ar, MD   100 mg at 04/23/19 0813  . magnesium hydroxide (MILK OF MAGNESIA) suspension 30 mL  30 mL Oral Daily PRN Cristofano, Dorene Ar, MD      . metFORMIN (GLUCOPHAGE) tablet 1,000 mg  1,000 mg Oral BID WC Cristofano, Dorene Ar, MD   1,000 mg at 04/23/19 [id number]    Lab Results:  Results for orders placed or performed during the hospital encounter of 04/21/19 (from the past 48 hour(s))  Hemoglobin A1c     Status: Abnormal   Collection Time: 04/22/19  7:00 AM  Result Value Ref Range   Hgb A1c MFr Bld 9.6 (H) 4.8 - 5.6 %    Comment: (NOTE) Pre diabetes:          5.7%-6.4% Diabetes:               >6.4% Glycemic control for   <7.0% adults with diabetes    Mean Plasma Glucose 228.82 mg/dL    Comment: Performed at [facility],  [address] [place], [place] [address]  Hepatic function panel     Status: None   Collection Time: 04/22/19  7:00 AM  Result Value Ref Range   Total Protein 7.9 6.5 - 8.1 g/dL   Albumin 4.8 3.5 - 5.0 g/dL   AST 20 15 - 41 U/L   ALT 21 0 - 44 U/L   Alkaline Phosphatase 70 38 - 126 U/L   Total Bilirubin 1.0 0.3 - 1.2 mg/dL   Bilirubin, Direct 0.2 0.0 - 0.2 mg/dL   Indirect Bilirubin 0.8 0.3 - 0.9 mg/dL    Comment: Performed at [facility], [address] [place], [place] [address]  Lipid panel     Status: Abnormal   Collection Time: 04/22/19  7:00 AM  Result Value Ref Range   Cholesterol 218 (H) 0 - 200 mg/dL   Triglycerides 352 (H) <150 mg/dL   HDL 38 (L) >40 mg/dL   Total CHOL/HDL Ratio 5.7 RATIO   VLDL 70 (H) 0 - 40 mg/dL   LDL Cholesterol 110 (H) 0 - 99 mg/dL    Comment:        Total Cholesterol/HDL:CHD Risk Coronary Heart Disease Risk Table                     Men   Women  1/2 Average Risk   3.4   3.3  Average Risk       5.0   4.4  2 X Average Risk   9.6   7.1  3 X Average Risk  23.4   11.0        Use the calculated Patient Ratio above and the CHD Risk Table to determine the patient's CHD Risk.        ATP III CLASSIFICATION (LDL):  <100     mg/dL   Optimal  100-129  mg/dL   Near or Above                    Optimal  130-159  mg/dL   Borderline  160-189  mg/dL   High  >190     mg/dL   Very High Performed at [facility], [address] [place], [place] [address]   TSH     Status: None   Collection Time: 04/22/19  7:00 AM  Result Value Ref Range   TSH 2.720 0.350 - 4.500 uIU/mL    Comment: Performed by a 3rd Generation assay with a functional sensitivity of <=0.01 uIU/mL. Performed at [facility], [address] [place],  [address]     Blood Alcohol level:  Lab Results   Component Value Date   [facility] <10 04/20/2019   ETH <10 [id number]    Metabolic Disorder Labs: Lab Results  Component Value Date   HGBA1C 9.6 (H) 04/22/2019   MPG 228.82 04/22/2019   MPG 202.99 02/23/2019   No results found for: PROLACTIN Lab Results  Component Value Date   CHOL 218 (H) 04/22/2019   TRIG 352 (H) 04/22/2019   HDL 38 (L) 04/22/2019   CHOLHDL 5.7 04/22/2019   VLDL 70 (H) 04/22/2019   LDLCALC 110 (H) 04/22/2019   LDLCALC 68 02/23/2019    Physical Findings: AIMS:  , ,  ,  ,    CIWA:    COWS:     Musculoskeletal: Strength & Muscle Tone: within normal limits Gait & Station: normal Patient leans: N/A  Psychiatric Specialty Exam: Physical Exam  Nursing note and vitals  reviewed. Constitutional: He is oriented to person, place, and time. He appears well-developed and well-nourished.  Cardiovascular: Normal rate.  Respiratory: Effort normal.  Musculoskeletal:        General: Normal range of motion.  Neurological: He is alert and oriented to person, place, and time.  Skin: Skin is warm.  Psychiatric: He exhibits a depressed mood.    Review of Systems  Constitutional: Negative.   HENT: Negative.   Eyes: Negative.   Respiratory: Negative.   Cardiovascular: Negative.   Gastrointestinal: Negative.   Genitourinary: Negative.   Musculoskeletal: Negative.   Skin: Negative.   Neurological: Negative.   Psychiatric/Behavioral: Positive for dysphoric mood.    Blood pressure 113/73, pulse 70, temperature 98.2 F (36.8 C), temperature source Oral, resp. rate 18, height 5\' 6"  (1.676 m), weight 77.6 kg, SpO2 100 %.Body mass index is 27.6 kg/m.  General Appearance: Casual  Eye Contact:  Good  Speech:  Clear and Coherent and Normal Rate  Volume:  Decreased  Mood:  Depressed  Affect:  Congruent and Depressed  Thought Process:  Coherent and Descriptions of Associations: Intact  Orientation:  Full (Time, Place, and Person)  Thought Content:  WDL  Suicidal  Thoughts:  No  Homicidal Thoughts:  No  Memory:  Immediate;   Fair Recent;   Fair Remote;   Fair  Judgement:  Fair  Insight:  Fair  Psychomotor Activity:  Normal  Concentration:  Concentration: Fair  Recall:  [employer] of Knowledge:  Fair  Language:  Fair  Akathisia:  No  Handed:  Right  AIMS (if indicated):     Assets:  Communication Skills Desire for Improvement Housing Resilience Transportation  ADL's:  Intact  Cognition:  WNL  Sleep:  Number of Hours: 8.5   Assessment: Patient presents in his room and is pleasant, calm, cooperative.  Patient appears to be depressed and has flat affect.  Patient has continued to deny any suicidal or homicidal ideations.  Patient states that he is taking him with a place to stay other than his mom's.  He is coming up with other possibilities and also reports having transportation to get to the other places he wishes to stay.  He reported that he would use the wrist today to contact family and friends to find a new place to stay after discharge.  He does report that he is feeling somewhat better and his depression is decreasing.  He does report that he feels that once he has a known place that he is going to stay that that will assist his depression more knowing that he does not have to return to live with his mother.  Reviewed patient's labs and his A1c is at 9.6.  Patient's TSH is at 2.72.  Patient's lipid panel shows total cholesterol of 218, HDL of 38, LDL of 110, triglycerides of 352 and VLDL at 70.  Treatment Plan Summary: Daily contact with patient to assess and evaluate symptoms and progress in treatment and Medication management Continue aspirin 81 mg p.o. daily Continue Lipitor 80 mg p.o. daily for hyperlipidemia Continue Coreg 25 mg p.o. twice daily with meals for hypertension Continue Neurontin 100 mg p.o. 3 times daily for alcohol use Continue Cozaar 100 mg p.o. daily for hypertension Continue metformin 1000 mg p.o. twice daily with  meals for diabetes Encourage group therapy participation Continue every 15 minute safety checks  Lewis Shock, FNP 04/23/2019, 11:42 AM

## 2019-04-23 NOTE — Progress Notes (Signed)
Recreation Therapy Notes  INPATIENT RECREATION THERAPY ASSESSMENT  Patient Details Name: Christian [name] MRN: [id number] DOB: [birth year] Today's Date: 04/23/2019       Information Obtained From: Patient  Able to Participate in Assessment/Interview: Yes  Patient Presentation: Responsive  Reason for Admission (Per Patient): Active Symptoms  Patient Stressors:    Coping Skills:   (None)  Leisure Interests (2+):  (None)  Frequency of Recreation/Participation: Monthly  Awareness of Community Resources:  Yes  Community Resources:  Other (Comment)([facility])  Current Use:    If no, Barriers?:    Expressed Interest in [employer] Information:    [place] of Residence:  Insurance underwriter  Patient Main Form of Transportation: Musician  Patient Strengths:  N/A  Patient Identified Areas of Improvement:  Not being so overwhelmed  Patient Goal for Hospitalization:  Get over this thought of killing myself  Current SI (including self-harm):  No  Current HI:  No  Current AVH: No  Staff Intervention Plan: Group Attendance, Collaborate with Interdisciplinary Treatment Team  Consent to Intern Participation: N/A  Macon Lesesne 04/23/2019, 3:23 PM

## 2019-04-23 NOTE — Plan of Care (Signed)
Patient compliant with procedures on the unit this evening   Problem: Education: Goal: Knowledge of [facility] Education information/materials will improve Outcome: Progressing

## 2019-04-23 NOTE — Progress Notes (Signed)
Patient was in the day room upon arrival to the unit. Patient presents with sad and depressed affect. Patient denies SI/HI/AVH and pain. Patient endorses anxiety and depression rating them 6/10. Patient given support and encouragement to be active in his treatment plan. Patient observed interacting appropriately with staff and peers on the unit. Patient more isolative to this room this evening. Patient being monitored Q 15 minutes for safety per unit protocol. Patient remains safe on the unit.

## 2019-04-23 NOTE — Progress Notes (Signed)
Recreation Therapy Notes    Date: 04/23/2019  Time: 9:30 am  Location: Craft room  Behavioral response: Appropriate  Intervention Topic: Self-esteem   Discussion/Intervention:  Group content today was focused on self-esteem. Patient defined self-esteem and where it comes form. The group described reasons self-esteem is important. Individuals stated things that impact self-esteem and positive ways to improve self-esteem. The group participated in the intervention "Getting to know me" where patients were able to think of positive things that makes them who they are. Clinical Observations/Feedback:  Patient came to group late due to unknown reasons. Individual was social with peers and staff while participating in the intervention during group.  Kelcy Laible LRT/CTRS         Miracle Mongillo 04/23/2019 12:06 PM

## 2019-04-23 NOTE — Plan of Care (Signed)
Patient rated his depression and anxiety 8/10.Patient stated that I am working on my depression and his goal for today is stop thinking so much.Patient denies SI,HI and AVH.Attended groups.Visible in the milieu.Minimal interactions with staff & peers.Compliant with medications.Appetite and energy level good.Support and encouragement given.

## 2019-04-23 NOTE — BHH Group Notes (Signed)
LCSW Group Therapy Note  04/23/2019 1:00 PM  Type of Therapy and Topic:  Group Therapy:  Feelings around Relapse and Recovery  Participation Level:  None   Description of Group:    Patients in this group will discuss emotions they experience before and after a relapse. They will process how experiencing these feelings, or avoidance of experiencing them, relates to having a relapse. Facilitator will guide patients to explore emotions they have related to recovery. Patients will be encouraged to process which emotions are more powerful. They will be guided to discuss the emotional reaction significant others in their lives may have to their relapse or recovery. Patients will be assisted in exploring ways to respond to the emotions of others without this contributing to a relapse.  Therapeutic Goals: 1. Patient will identify two or more emotions that lead to a relapse for them 2. Patient will identify two emotions that result when they relapse 3. Patient will identify two emotions related to recovery 4. Patient will demonstrate ability to communicate their needs through discussion and/or role plays   Summary of Patient Progress: Patient attended group, however did not engage in group discussion.    Therapeutic Modalities:   Cognitive Behavioral Therapy Solution-Focused Therapy Assertiveness Training Relapse Prevention Therapy   Assunta Curtis, MSW, LCSW 04/23/2019 2:30 PM

## 2019-04-24 DIAGNOSIS — F332 Major depressive disorder, recurrent severe without psychotic features: Secondary | ICD-10-CM

## 2019-04-24 NOTE — Progress Notes (Signed)
Isolative to self and room. Forwards minimal. Denies SI, HI, AVH. Endorses depression at a 5. No interaction with peers. Minimal interaction with staff. Flat, sad affect.  Encouragement and support offered. Safety checks maintained. Pt receptive and remains safe on unit with q 15 min checks.

## 2019-04-24 NOTE — Progress Notes (Signed)
[facility] MD Progress Note  04/24/2019 10:42 AM Christian [name]  MRN:  [id number] Subjective:    Patient well known to the staff multiple admissions and encounters, 49 years of age history of depression and alcoholism, states he is here "the same thing, my mama was just running her mouth" but he states that he is feeling a little better and he denies wanting to harm himself at the present time only passive thoughts of not wanting to be here  Affect is flat and constricted but again he reports no psychosis no cravings tremors or withdrawal symptoms. Principal Problem: MDD (major depressive disorder) Diagnosis: Principal Problem:   MDD (major depressive disorder) Active Problems:   CAD (coronary artery disease)   HTN (hypertension)   Hyperlipidemia   Diabetes mellitus, type II (HCC)   Alcohol dependence in early, early partial, sustained full, or sustained partial remission ([place])  Total Time spent with patient: 20 minutes  Past Psychiatric History: Prior similar encounters  Past Medical History:  Past Medical History:  Diagnosis Date  . CAD (coronary artery disease)    a. 07/2013 NSTEMI/PCI: LCX 72m (4.0x23 Xience DES), RCA 37m, EF > 55%;  b. 07/2013 Echo: EF 60-65%, diast dysfxn, mild LVH, mildly dil LA, nl RV size/fxn, nl RVSP.  Marland Kitchen History of tobacco abuse    a. quit ~ 2010  . Hyperlipidemia   . Morbid obesity ([place])    a. weighed 168 @ age 56.  . Scoliosis     Past Surgical History:  Procedure Laterality Date  . ADENOIDECTOMY    . CARDIAC CATHETERIZATION  07/2013   [facility]'x1 stent   Family History:  Family History  Problem Relation Age of Onset  . Heart disease Mother   . Esophageal cancer Father    Family Psychiatric  History: See eval Social History:  Social History   Substance and Sexual Activity  Alcohol Use Yes     Social History   Substance and Sexual Activity  Drug Use No  . Types: Marijuana   Comment: past    Social History   Socioeconomic History  . Marital  status: Single    Spouse name: Not on file  . Number of children: Not on file  . Years of education: Not on file  . Highest education level: Not on file  Occupational History  . Not on file  Tobacco Use  . Smoking status: Former Smoker    Packs/day: 0.50    Years: 20.00    Pack years: 10.00    Types: Cigarettes  . Smokeless tobacco: Never Used  Substance and Sexual Activity  . Alcohol use: Yes  . Drug use: No    Types: Marijuana    Comment: past  . Sexual activity: Not on file  Other Topics Concern  . Not on file  Social History Narrative  . Not on file   Social Determinants of Health   Financial Resource Strain:   . Difficulty of Paying Living Expenses: Not on file  Food Insecurity:   . Worried About Charity fundraiser in the Last Year: Not on file  . Ran Out of Food in the Last Year: Not on file  Transportation Needs:   . Lack of Transportation (Medical): Not on file  . Lack of Transportation (Non-Medical): Not on file  Physical Activity:   . Days of Exercise per Week: Not on file  . Minutes of Exercise per Session: Not on file  Stress:   . Feeling of Stress :  Not on file  Social Connections:   . Frequency of Communication with Friends and Family: Not on file  . Frequency of Social Gatherings with Friends and Family: Not on file  . Attends Religious Services: Not on file  . Active Member of Clubs or Organizations: Not on file  . Attends Archivist Meetings: Not on file  . Marital Status: Not on file   Additional Social History:                         Sleep: Good  Appetite:  Good  Current Medications: Current Facility-Administered Medications  Medication Dose Route Frequency Provider Last Rate Last Admin  . acetaminophen (TYLENOL) tablet 650 mg  650 mg Oral Q6H PRN Cristofano, Dorene Ar, MD      . alum & mag hydroxide-simeth (MAALOX/MYLANTA) 200-200-20 MG/5ML suspension 30 mL  30 mL Oral Q4H PRN Cristofano, Paul A, MD      . aspirin  chewable tablet 81 mg  81 mg Oral Daily Cristofano, Dorene Ar, MD   81 mg at 04/24/19 0808  . atorvastatin (LIPITOR) tablet 80 mg  80 mg Oral q1800 Cristofano, Dorene Ar, MD   80 mg at 04/23/19 1637  . carvedilol (COREG) tablet 25 mg  25 mg Oral BID WC Cristofano, Dorene Ar, MD   25 mg at 04/24/19 0808  . gabapentin (NEURONTIN) capsule 100 mg  100 mg Oral TID Clapacs, Madie Reno, MD   100 mg at 04/24/19 0808  . losartan (COZAAR) tablet 100 mg  100 mg Oral Daily Cristofano, Dorene Ar, MD   100 mg at 04/24/19 0808  . magnesium hydroxide (MILK OF MAGNESIA) suspension 30 mL  30 mL Oral Daily PRN Cristofano, Paul A, MD      . metFORMIN (GLUCOPHAGE) tablet 1,000 mg  1,000 mg Oral BID WC Cristofano, Dorene Ar, MD   1,000 mg at 04/24/19 0808    Lab Results: No results found for this or any previous visit (from the past 48 hour(s)).  Blood Alcohol level:  Lab Results  Component Value Date   ETH <10 04/20/2019   ETH <10 [id number]    Metabolic Disorder Labs: Lab Results  Component Value Date   HGBA1C 9.6 (H) 04/22/2019   MPG 228.82 04/22/2019   MPG 202.99 02/23/2019   No results found for: PROLACTIN Lab Results  Component Value Date   CHOL 218 (H) 04/22/2019   TRIG 352 (H) 04/22/2019   HDL 38 (L) 04/22/2019   CHOLHDL 5.7 04/22/2019   VLDL 70 (H) 04/22/2019   LDLCALC 110 (H) 04/22/2019   LDLCALC 68 02/23/2019    Physical Findings: AIMS:  , ,  ,  ,    CIWA:    COWS:     Musculoskeletal: Strength & Muscle Tone: within normal limits Gait & Station: normal Patient leans: N/A and But did not get out of bed  Psychiatric Specialty Exam: Physical Exam  Review of Systems  Blood pressure 113/73, pulse 70, temperature 98.2 F (36.8 C), temperature source Oral, resp. rate 18, height 5\' 6"  (1.676 m), weight 77.6 kg, SpO2 100 %.Body mass index is 27.6 kg/m.  General Appearance: Disheveled  Eye Contact:  Fair  Speech:  Slow  Volume:  Decreased  Mood:  Anxious and Depressed  Affect:  Blunt and Congruent   Thought Process:  Coherent and Goal Directed  Orientation:  Full (Time, Place, and Person)  Thought Content:  Logical  Suicidal Thoughts:  No  Homicidal Thoughts:  No  Memory:  Immediate;   Fair Recent;   Fair Remote;   Good  Judgement:  Good  Insight:  Good  Psychomotor Activity:  Normal  Concentration:  Concentration: Good and Attention Span: Good  Recall:  Good  Fund of Knowledge:  Good  Language:  Good  Akathisia:  Negative  Handed:  Right  AIMS (if indicated):     Assets:  Physical Health Resilience  ADL's:  Intact  Cognition:  WNL  Sleep:  Number of Hours: 7.5     Treatment Plan Summary: Daily contact with patient to assess and evaluate symptoms and progress in treatment and Medication management  Continue cognitive therapy no change in medications or precautions continue to monitor for safety. Continue to monitor for withdrawal  Johnn Hai, MD 04/24/2019, 10:42 AM

## 2019-04-24 NOTE — BHH Group Notes (Signed)
[facility] Group Notes:  (Nursing/MHT/Case Management/Adjunct)  Date:  04/24/2019  Time:  9:14 PM  Type of Therapy:  Group Therapy  Participation Level:  Active  Participation Quality:  Appropriate  Affect:  Appropriate  Cognitive:  Alert  Insight:  Good  Engagement in Group:  Engaged  Modes of Intervention:  Support  Summary of Progress/Problems:  Christian [name] 04/24/2019, 9:14 PM

## 2019-04-24 NOTE — [facility] Group Notes (Signed)
04/24/2019 1:00pm   Type of Therapy and Topic:  Group Therapy:  Change and Accountability  Participation Level:  Active  Description of Group In this group, patients discussed power and accountability for change.  The group identified the challenges related to accountability and the difficulty of accepting the outcomes of negative behaviors.  Patients were encouraged to openly discuss a challenge/change they could take responsibility for.  Patients discussed the use of "change talk" and positive thinking as ways to support achievement of personal goals.  The group discussed ways to give support and empowerment to peers.  Therapeutic Goals: 1. Patients will state the relationship between personal power and accountability in the change process 2. Patients will identify the positive and negative consequences of a personal choice they have made 3. Patients will identify one challenge/choice they will take responsibility for making 4. Patients will discuss the role of "change talk" and the impact of positive thinking as it supports successful personal change 5. Patients will verbalize support and affirmation of change efforts in peers  Summary of Patient Progress:  Pt described his current feeling as "anxious". When asked what was making him feel that way, pt reported "because I got put here and I can't take anything from my mother. I can't take anymore of her alzheimer's". Pt was able to identify one thing he is committed to working on going forward, which was starting the application process for disability assistance.    Therapeutic Modalities Solution Focused Brief Therapy Motivational Interviewing Cognitive Behavioral Therapy    Juanetta Beets, MSW, [facility] Clinical Social Worker  04/24/2019 10:50 AM

## 2019-04-24 NOTE — Plan of Care (Signed)
Patient was in room awake then came to the dayroom for group and snack. Alert and oriented. Cooperative  but expressing sadness and depression. Denied suicidal thoughts.  Patient attended group and maintained expected behavior. Support and encouragements provided. Safety precautions maintained.

## 2019-04-25 DIAGNOSIS — F332 Major depressive disorder, recurrent severe without psychotic features: Secondary | ICD-10-CM

## 2019-04-25 MED ORDER — GABAPENTIN 300 MG PO CAPS
300.0000 mg | ORAL_CAPSULE | Freq: Three times a day (TID) | ORAL | Status: DC
  Administered 2019-04-25 – 2019-04-27 (×6): 300 mg via ORAL
  Filled 2019-04-25 (×6): qty 1

## 2019-04-25 NOTE — Plan of Care (Signed)
D- Patient alert and oriented. Patient presented in a depressed, but pleasant mood on assessment stating that he was tried this morning. Patient endorsed passive SI, however, he stated he feels safe on the unit. Patient also endorsed depression/anxiety, rating them an "8/10" and "9/10", reporting that "all the shit I'm going back to when I get out of here", is why he's feeling this way. Patient denied HI, AVH, and pain at this time. Patient's goal for today is "group", in which he did attend social work group, however, his participation level was minimal.  A- Scheduled medications administered to patient, per MD orders. Support and encouragement provided.  Routine safety checks conducted every 15 minutes.  Patient informed to notify staff with problems or concerns.  R- No adverse drug reactions noted. Patient contracts for safety at this time. Patient compliant with medications and treatment plan. Patient receptive, calm, and cooperative. Patient interacts well with others on the unit.  Patient remains safe at this time.  Problem: Education: Goal: Knowledge of [facility] Education information/materials will improve Outcome: Progressing   Problem: Coping: Goal: Ability to verbalize frustrations and anger appropriately will improve Outcome: Progressing Goal: Ability to demonstrate self-control will improve Outcome: Progressing   Problem: Safety: Goal: Periods of time without injury will increase Outcome: Progressing   Problem: Activity: Goal: Imbalance in normal sleep/wake cycle will improve Outcome: Progressing   Problem: Health Behavior/Discharge Planning: Goal: Ability to make decisions will improve Outcome: Progressing   Problem: Medication: Goal: Compliance with prescribed medication regimen will improve Outcome: Progressing   Problem: Self-Concept: Goal: Ability to disclose and discuss suicidal ideas will improve Outcome: Progressing Goal: Will verbalize positive  feelings about self Outcome: Progressing   Problem: Education: Goal: Ability to state activities that reduce stress will improve Outcome: Progressing   Problem: Self-Concept: Goal: Ability to modify response to factors that promote anxiety will improve Outcome: Progressing

## 2019-04-25 NOTE — [facility] Group Notes (Signed)
04/25/2019 10:30 am   Type of Therapy and Topic:  Group Therapy:  Change and Accountability  Participation Level:  Minimal  Description of Group In this group, patients discussed power and accountability for change.  The group identified the challenges related to accountability and the difficulty of accepting the outcomes of negative behaviors.  Patients were encouraged to openly discuss a challenge/change they could take responsibility for.  Patients discussed the use of "change talk" and positive thinking as ways to support achievement of personal goals.  The group discussed ways to give support and empowerment to peers.  Therapeutic Goals: 1. Patients will state the relationship between personal power and accountability in the change process 2. Patients will identify the positive and negative consequences of a personal choice they have made 3. Patients will identify one challenge/choice they will take responsibility for making 4. Patients will discuss the role of "change talk" and the impact of positive thinking as it supports successful personal change 5. Patients will verbalize support and affirmation of change efforts in peers  Summary of Patient Progress:  CSW discussed Stages of Change with the group. The pt attended the group 30 minutes after the group started. The pt was able to identify which stage of change he was in, by discussing his plan to attending an upcoming court date.   Therapeutic Modalities Solution Focused Brief Therapy Motivational Interviewing Cognitive Behavioral Therapy    Christian [name], MSW, [facility] Clinical Social Worker  04/25/2019 11:36 AM

## 2019-04-25 NOTE — Progress Notes (Signed)
This Probation officer held patient's BP medication because his BP was 97/74. Patient was given Gatorade.

## 2019-04-25 NOTE — Progress Notes (Signed)
[facility] MD Progress Note  04/25/2019 10:07 AM Christian [name]  MRN:  [id number] Subjective:    Patient in bed complaining of back pain from "scoliosis" he denies wanting to harm himself but states he still "not ready to go" Denies any auditory or visual hallucination somewhat passive and poorly engaged in attempts at cognitive therapy so we engage in some supportive therapy discussed options for his back pain such as escalation of gabapentin Principal Problem: MDD (major depressive disorder) Diagnosis: Principal Problem:   MDD (major depressive disorder) Active Problems:   CAD (coronary artery disease)   HTN (hypertension)   Hyperlipidemia   Diabetes mellitus, type II ([place])   Alcohol dependence in early, early partial, sustained full, or sustained partial remission ([place])  Total Time spent with patient: 20 minutes  Past Psychiatric History: No new data  Past Medical History:  Past Medical History:  Diagnosis Date  . CAD (coronary artery disease)    a. 07/2013 NSTEMI/PCI: LCX 38m (4.0x23 Xience DES), RCA 109m, EF > 55%;  b. 07/2013 Echo: EF 60-65%, diast dysfxn, mild LVH, mildly dil LA, nl RV size/fxn, nl RVSP.  Marland Kitchen History of tobacco abuse    a. quit ~ 2010  . Hyperlipidemia   . Morbid obesity ([place])    a. weighed 168 @ age 80.  . Scoliosis     Past Surgical History:  Procedure Laterality Date  . ADENOIDECTOMY    . CARDIAC CATHETERIZATION  07/2013   [facility]'x1 stent   Family History:  Family History  Problem Relation Age of Onset  . Heart disease Mother   . Esophageal cancer Father    Family Psychiatric  History: No new data Social History:  Social History   Substance and Sexual Activity  Alcohol Use Yes     Social History   Substance and Sexual Activity  Drug Use No  . Types: Marijuana   Comment: past    Social History   Socioeconomic History  . Marital status: Single    Spouse name: Not on file  . Number of children: Not on file  . Years of education: Not on file  .  Highest education level: Not on file  Occupational History  . Not on file  Tobacco Use  . Smoking status: Former Smoker    Packs/day: 0.50    Years: 20.00    Pack years: 10.00    Types: Cigarettes  . Smokeless tobacco: Never Used  Substance and Sexual Activity  . Alcohol use: Yes  . Drug use: No    Types: Marijuana    Comment: past  . Sexual activity: Not on file  Other Topics Concern  . Not on file  Social History Narrative  . Not on file   Social Determinants of Health   Financial Resource Strain:   . Difficulty of Paying Living Expenses: Not on file  Food Insecurity:   . Worried About Charity fundraiser in the Last Year: Not on file  . Ran Out of Food in the Last Year: Not on file  Transportation Needs:   . Lack of Transportation (Medical): Not on file  . Lack of Transportation (Non-Medical): Not on file  Physical Activity:   . Days of Exercise per Week: Not on file  . Minutes of Exercise per Session: Not on file  Stress:   . Feeling of Stress : Not on file  Social Connections:   . Frequency of Communication with Friends and Family: Not on file  . Frequency of  Social Gatherings with Friends and Family: Not on file  . Attends Religious Services: Not on file  . Active Member of Clubs or Organizations: Not on file  . Attends Archivist Meetings: Not on file  . Marital Status: Not on file   Additional Social History:                         Sleep: Fair  Appetite:  Fair  Current Medications: Current Facility-Administered Medications  Medication Dose Route Frequency Provider Last Rate Last Admin  . acetaminophen (TYLENOL) tablet 650 mg  650 mg Oral Q6H PRN Cristofano, Dorene Ar, MD      . alum & mag hydroxide-simeth (MAALOX/MYLANTA) 200-200-20 MG/5ML suspension 30 mL  30 mL Oral Q4H PRN Cristofano, Paul A, MD      . aspirin chewable tablet 81 mg  81 mg Oral Daily Cristofano, Dorene Ar, MD   81 mg at 04/25/19 [id number]  . atorvastatin (LIPITOR) tablet 80  mg  80 mg Oral q1800 Cristofano, Dorene Ar, MD   80 mg at 04/24/19 1651  . carvedilol (COREG) tablet 25 mg  25 mg Oral BID WC Cristofano, Dorene Ar, MD   25 mg at 04/25/19 [id number]  . gabapentin (NEURONTIN) capsule 300 mg  300 mg Oral TID Johnn Hai, MD      . losartan (COZAAR) tablet 100 mg  100 mg Oral Daily Cristofano, Dorene Ar, MD   100 mg at 04/25/19 [id number]  . magnesium hydroxide (MILK OF MAGNESIA) suspension 30 mL  30 mL Oral Daily PRN Cristofano, Paul A, MD      . metFORMIN (GLUCOPHAGE) tablet 1,000 mg  1,000 mg Oral BID WC Cristofano, Dorene Ar, MD   1,000 mg at 04/25/19 [id number]    Lab Results: No results found for this or any previous visit (from the past 48 hour(s)).  Blood Alcohol level:  Lab Results  Component Value Date   ETH <10 04/20/2019   ETH <10 [id number]    Metabolic Disorder Labs: Lab Results  Component Value Date   HGBA1C 9.6 (H) 04/22/2019   MPG 228.82 04/22/2019   MPG 202.99 02/23/2019   No results found for: PROLACTIN Lab Results  Component Value Date   CHOL 218 (H) 04/22/2019   TRIG 352 (H) 04/22/2019   HDL 38 (L) 04/22/2019   CHOLHDL 5.7 04/22/2019   VLDL 70 (H) 04/22/2019   LDLCALC 110 (H) 04/22/2019   LDLCALC 68 02/23/2019    Physical Findings: AIMS:  , ,  ,  ,    CIWA:    COWS:     Musculoskeletal: Strength & Muscle Tone: within normal limits Gait & Station: normal Patient leans: N/A  Psychiatric Specialty Exam: Physical Exam  Review of Systems  Blood pressure 123/79, pulse 79, temperature 98.6 F (37 C), temperature source Oral, resp. rate 18, height 5\' 6"  (1.676 m), weight 77.6 kg, SpO2 100 %.Body mass index is 27.6 kg/m.  General Appearance: Disheveled  Eye Contact:  Fair  Speech:  Clear and Coherent  Volume:  Decreased  Mood:  Dysphoric  Affect:  Congruent  Thought Process:  Coherent and Goal Directed  Orientation:  Full (Time, Place, and Person)  Thought Content: Denies psychotic symptoms  Suicidal Thoughts:  No  Homicidal Thoughts:  No   Memory:  Immediate;   Fair Recent;   Fair Remote;   Fair  Judgement:  Fair  Insight:  Fair  Psychomotor Activity:  Normal  Concentration:  Concentration:  Fair and Attention Span: Fair  Recall:  [employer] of Knowledge:  Fair  Language:  Fair  Akathisia:  Negative  Handed:  Right  AIMS (if indicated):     Assets:  Housing Leisure Time Physical Health Resilience Social Support  ADL's:  Intact  Cognition:  WNL  Sleep:  Number of Hours: 7.25     Treatment Plan Summary: Daily contact with patient to assess and evaluate symptoms and progress in treatment and Medication management no change in current meds or precautions except for the escalation of gabapentin for complaints of back pain.  Continue supportive therapy  Veronnica Hennings, MD 04/25/2019, 10:07 AM

## 2019-04-26 MED ORDER — TRAZODONE HCL 50 MG PO TABS: 50.0000 mg | ORAL_TABLET | Freq: Every evening | ORAL | 0 refills | Status: DC | PRN

## 2019-04-26 MED ORDER — METFORMIN HCL 1000 MG PO TABS: 1000.0000 mg | ORAL_TABLET | Freq: Two times a day (BID) | ORAL | 0 refills | Status: DC

## 2019-04-26 MED ORDER — CARVEDILOL 25 MG PO TABS: 25.0000 mg | ORAL_TABLET | Freq: Two times a day (BID) | ORAL | 0 refills | Status: DC

## 2019-04-26 MED ORDER — MIRTAZAPINE 30 MG PO TABS: 30.0000 mg | ORAL_TABLET | Freq: Every day | ORAL | 0 refills | Status: DC

## 2019-04-26 MED ORDER — ATORVASTATIN CALCIUM 80 MG PO TABS: 80.0000 mg | ORAL_TABLET | Freq: Every day | ORAL | 0 refills | Status: DC

## 2019-04-26 MED ORDER — ASPIRIN 81 MG PO CHEW: 81.0000 mg | CHEWABLE_TABLET | Freq: Every day | ORAL | 0 refills | Status: DC

## 2019-04-26 MED ORDER — LOSARTAN POTASSIUM 100 MG PO TABS: 100.0000 mg | ORAL_TABLET | Freq: Every day | ORAL | 0 refills | Status: DC

## 2019-04-26 MED ORDER — GABAPENTIN 300 MG PO CAPS: 300.0000 mg | ORAL_CAPSULE | Freq: Three times a day (TID) | ORAL | 0 refills | Status: DC

## 2019-04-26 NOTE — Plan of Care (Signed)
Patient rated his depression and anxiety 5/10.Patient stated that he is anxious about going to jail from here.Patient appears with a low energy level.Minimal interactions with peers.Stayed in bed most of the day.Denies SI,HI and AVH.Compliant with medications.Attended groups.Support and encouragement given.

## 2019-04-26 NOTE — Progress Notes (Signed)
Recreation Therapy Notes  Date: 04/26/2019  Time: 9:30 am  Location: Craft room  Behavioral response: Appropriate  Intervention Topic: Necessities   Discussion/Intervention:  Group content on today was focused on necessities. The group defined necessities and how they determine their necessities. Individuals expressed how many necessities they have and if it changes from day to day. Patients described the difference between wants and needs. The group explained how they have overspent on wants in the past. Individuals described a reoccurring necessity for them. The intervention "What I need" helped patients differentiate between wants and needs.  Clinical Observations/Feedback:  Patient came to group late due to unknown reasons. Individual was social with peers and staff while participating in the intervention during group.  Gay Rape LRT/CTRS         [name] 04/26/2019 11:57 AM

## 2019-04-26 NOTE — Progress Notes (Signed)
[facility] MD Progress Note  04/26/2019 6:28 PM Christian [name]  MRN:  [id number] Subjective: Patient seen chart reviewed.  Follow-up for this gentleman with recurrent depression.  Patient says he is still feeling a little down but better than he was even a day ago.  He has resigned himself that he will go home to see his mother for a day or so but then still thinks he is going to try to move to a friend's house or down to the beach.  Patient is denying any suicidal ideation.  Affect still blunted but he is taking care of his hygiene okay comes out of his room to interact.  Has interacted appropriately and showing good insight. Principal Problem: MDD (major depressive disorder) Diagnosis: Principal Problem:   MDD (major depressive disorder) Active Problems:   CAD (coronary artery disease)   HTN (hypertension)   Hyperlipidemia   Diabetes mellitus, type II ([facility])   Alcohol dependence in early, early partial, sustained full, or sustained partial remission ([place])  Total Time spent with patient: 30 minutes  Past Psychiatric History: History of chronic recurrent depression and situational sadness.  History of alcohol abuse currently under pretty good remission  Past Medical History:  Past Medical History:  Diagnosis Date  . CAD (coronary artery disease)    a. 07/2013 NSTEMI/PCI: LCX 44m (4.0x23 Xience DES), RCA 14m, EF > 55%;  b. 07/2013 Echo: EF 60-65%, diast dysfxn, mild LVH, mildly dil LA, nl RV size/fxn, nl RVSP.  Marland Kitchen History of tobacco abuse    a. quit ~ 2010  . Hyperlipidemia   . Morbid obesity ([place])    a. weighed 168 @ age 21.  . Scoliosis     Past Surgical History:  Procedure Laterality Date  . ADENOIDECTOMY    . CARDIAC CATHETERIZATION  07/2013   [facility]'x1 stent   Family History:  Family History  Problem Relation Age of Onset  . Heart disease Mother   . Esophageal cancer Father    Family Psychiatric  History: See previous Social History:  Social History   Substance and Sexual Activity   Alcohol Use Yes     Social History   Substance and Sexual Activity  Drug Use No  . Types: Marijuana   Comment: past    Social History   Socioeconomic History  . Marital status: Single    Spouse name: Not on file  . Number of children: Not on file  . Years of education: Not on file  . Highest education level: Not on file  Occupational History  . Not on file  Tobacco Use  . Smoking status: Former Smoker    Packs/day: 0.50    Years: 20.00    Pack years: 10.00    Types: Cigarettes  . Smokeless tobacco: Never Used  Substance and Sexual Activity  . Alcohol use: Yes  . Drug use: No    Types: Marijuana    Comment: past  . Sexual activity: Not on file  Other Topics Concern  . Not on file  Social History Narrative  . Not on file   Social Determinants of Health   Financial Resource Strain:   . Difficulty of Paying Living Expenses: Not on file  Food Insecurity:   . Worried About Charity fundraiser in the Last Year: Not on file  . Ran Out of Food in the Last Year: Not on file  Transportation Needs:   . Lack of Transportation (Medical): Not on file  . Lack of Transportation (Non-Medical):  Not on file  Physical Activity:   . Days of Exercise per Week: Not on file  . Minutes of Exercise per Session: Not on file  Stress:   . Feeling of Stress : Not on file  Social Connections:   . Frequency of Communication with Friends and Family: Not on file  . Frequency of Social Gatherings with Friends and Family: Not on file  . Attends Religious Services: Not on file  . Active Member of Clubs or Organizations: Not on file  . Attends Archivist Meetings: Not on file  . Marital Status: Not on file   Additional Social History:                         Sleep: Fair  Appetite:  Fair  Current Medications: Current Facility-Administered Medications  Medication Dose Route Frequency Provider Last Rate Last Admin  . acetaminophen (TYLENOL) tablet 650 mg  650 mg Oral  Q6H PRN Cristofano, Dorene Ar, MD      . alum & mag hydroxide-simeth (MAALOX/MYLANTA) 200-200-20 MG/5ML suspension 30 mL  30 mL Oral Q4H PRN Cristofano, Paul A, MD      . aspirin chewable tablet 81 mg  81 mg Oral Daily Cristofano, Dorene Ar, MD   81 mg at 04/26/19 0747  . atorvastatin (LIPITOR) tablet 80 mg  80 mg Oral q1800 Cristofano, Dorene Ar, MD   80 mg at 04/26/19 1653  . carvedilol (COREG) tablet 25 mg  25 mg Oral BID WC Cristofano, Dorene Ar, MD   25 mg at 04/26/19 0747  . gabapentin (NEURONTIN) capsule 300 mg  300 mg Oral TID Johnn Hai, MD   300 mg at 04/26/19 1653  . losartan (COZAAR) tablet 100 mg  100 mg Oral Daily Cristofano, Dorene Ar, MD   100 mg at 04/26/19 0747  . magnesium hydroxide (MILK OF MAGNESIA) suspension 30 mL  30 mL Oral Daily PRN Cristofano, Paul A, MD      . metFORMIN (GLUCOPHAGE) tablet 1,000 mg  1,000 mg Oral BID WC Cristofano, Paul A, MD   1,000 mg at 04/26/19 1653    Lab Results: No results found for this or any previous visit (from the past 48 hour(s)).  Blood Alcohol level:  Lab Results  Component Value Date   ETH <10 04/20/2019   ETH <10 [id number]    Metabolic Disorder Labs: Lab Results  Component Value Date   HGBA1C 9.6 (H) 04/22/2019   MPG 228.82 04/22/2019   MPG 202.99 02/23/2019   No results found for: PROLACTIN Lab Results  Component Value Date   CHOL 218 (H) 04/22/2019   TRIG 352 (H) 04/22/2019   HDL 38 (L) 04/22/2019   CHOLHDL 5.7 04/22/2019   VLDL 70 (H) 04/22/2019   LDLCALC 110 (H) 04/22/2019   LDLCALC 68 02/23/2019    Physical Findings: AIMS: Facial and Oral Movements Muscles of Facial Expression: None, normal Lips and Perioral Area: None, normal Jaw: None, normal Tongue: None, normal,Extremity Movements Upper (arms, wrists, hands, fingers): None, normal Lower (legs, knees, ankles, toes): None, normal, Trunk Movements Neck, shoulders, hips: None, normal, Overall Severity Severity of abnormal movements (highest score from questions  above): None, normal Incapacitation due to abnormal movements: None, normal Patient's awareness of abnormal movements (rate only patient's report): No Awareness, Dental Status Current problems with teeth and/or dentures?: No Does patient usually wear dentures?: No  CIWA:    COWS:     Musculoskeletal: Strength & Muscle Tone: within  normal limits Gait & Station: normal Patient leans: N/A  Psychiatric Specialty Exam: Physical Exam  Nursing note and vitals reviewed. Constitutional: He appears well-developed and well-nourished.  HENT:  Head: Normocephalic and atraumatic.  Eyes: Pupils are equal, round, and reactive to light. Conjunctivae are normal.  Cardiovascular: Regular rhythm and normal heart sounds.  Respiratory: Effort normal.  GI: Soft.  Musculoskeletal:        General: Normal range of motion.     Cervical back: Normal range of motion.  Neurological: He is alert.  Skin: Skin is warm and dry.  Psychiatric: Judgment normal. His affect is blunt. His speech is delayed. He is slowed. Cognition and memory are normal. He expresses no homicidal and no suicidal ideation.    Review of Systems  Constitutional: Negative.   HENT: Negative.   Eyes: Negative.   Respiratory: Negative.   Cardiovascular: Negative.   Gastrointestinal: Negative.   Musculoskeletal: Negative.   Skin: Negative.   Neurological: Negative.   Psychiatric/Behavioral: Negative.     Blood pressure 94/76, pulse 72, temperature 98.9 F (37.2 C), temperature source Oral, resp. rate 18, height 5\' 6"  (1.676 m), weight 77.6 kg, SpO2 98 %.Body mass index is 27.6 kg/m.  General Appearance: Casual  Eye Contact:  Fair  Speech:  Clear and Coherent  Volume:  Normal  Mood:  Euthymic  Affect:  Constricted  Thought Process:  Coherent  Orientation:  Full (Time, Place, and Person)  Thought Content:  Logical  Suicidal Thoughts:  No  Homicidal Thoughts:  No  Memory:  Immediate;   Fair Recent;   Fair Remote;   Fair   Judgement:  Fair  Insight:  Fair  Psychomotor Activity:  Normal  Concentration:  Concentration: Fair  Recall:  [employer] of Knowledge:  Fair  Language:  Fair  Akathisia:  No  Handed:  Right  AIMS (if indicated):     Assets:  Desire for Improvement Physical Health Social Support  ADL's:  Intact  Cognition:  WNL  Sleep:  Number of Hours: 7.25     Treatment Plan Summary: Daily contact with patient to assess and evaluate symptoms and progress in treatment, Medication management and Plan Supportive therapy.  Encouragement.  Patient is feeling more upbeat and like he has more options.  We discussed discharge planning and will probably look at discharge by tomorrow  Alethia Berthold, MD 04/26/2019, 6:28 PM

## 2019-04-26 NOTE — Progress Notes (Signed)
Slept 7.30 hours

## 2019-04-26 NOTE — [facility] Group Notes (Signed)
Overcoming Obstacles  04/26/2019 1PM  Type of Therapy and Topic:  Group Therapy:  Overcoming Obstacles  Participation Level:  None    Description of Group:    In this group patients will be encouraged to explore what they see as obstacles to their own wellness and recovery. They will be guided to discuss their thoughts, feelings, and behaviors related to these obstacles. The group will process together ways to cope with barriers, with attention given to specific choices patients can make. Each patient will be challenged to identify changes they are motivated to make in order to overcome their obstacles. This group will be process-oriented, with patients participating in exploration of their own experiences as well as giving and receiving support and challenge from other group members.   Therapeutic Goals: 1. Patient will identify personal and current obstacles as they relate to admission. 2. Patient will identify barriers that currently interfere with their wellness or overcoming obstacles.  3. Patient will identify feelings, thought process and behaviors related to these barriers. 4. Patient will identify two changes they are willing to make to overcome these obstacles:      Summary of Patient Progress No participation or input provided during session. Pt sat quietly and respected boundaries during session.    Therapeutic Modalities:   Cognitive Behavioral Therapy Solution Focused Therapy Motivational Interviewing Relapse Prevention Therapy    Sanjuana Kava, MSW, LCSW 04/26/2019 1:49 PM

## 2019-04-26 NOTE — Progress Notes (Signed)
D- Patient alert and oriented. Patient was in a depressed mood with a flat affect.  Patient endorses passive SI.  Patient contracts for safety.  Denies HI, AVH, and pain.  Patient is anxious about discharge.  Patient reports that he missed his court date because he was admitted to the hospital and states "I'm probably going straight to jail when I leave here".  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  Patient could not identify any coping skills that he could utilize to help with his stressors upon discharge.  Patient has no other complaints at this time.     A- Support and encouragement provided.  Routine safety checks conducted every 15 minutes.  Patient informed to notify staff with problems or concerns. R- Patient calm and cooperative. Patient remains safe at this time.

## 2019-04-26 NOTE — [facility] Counselor (Signed)
CSW checked in with [facility] in [place].  Per them, the shelter is currently filled.  Patient reports that he does not want to return to the home with his mother and plans on staying with a friend or his sister.  Assunta Curtis, MSW, LCSW 04/26/2019 9:09 AM

## 2019-04-27 MED ORDER — CARVEDILOL 25 MG PO TABS: 25.0000 mg | ORAL_TABLET | Freq: Two times a day (BID) | ORAL | 1 refills | Status: DC

## 2019-04-27 MED ORDER — LOSARTAN POTASSIUM 100 MG PO TABS: 100.0000 mg | ORAL_TABLET | Freq: Every day | ORAL | 1 refills | Status: DC

## 2019-04-27 MED ORDER — TRAZODONE HCL 50 MG PO TABS: 50.0000 mg | ORAL_TABLET | Freq: Every evening | ORAL | 1 refills | Status: DC | PRN

## 2019-04-27 MED ORDER — METFORMIN HCL 1000 MG PO TABS: 1000.0000 mg | ORAL_TABLET | Freq: Two times a day (BID) | ORAL | 1 refills | Status: DC

## 2019-04-27 MED ORDER — ASPIRIN 81 MG PO CHEW: 81.0000 mg | CHEWABLE_TABLET | Freq: Every day | ORAL | 1 refills | Status: DC

## 2019-04-27 MED ORDER — MIRTAZAPINE 30 MG PO TABS: 30.0000 mg | ORAL_TABLET | Freq: Every day | ORAL | 1 refills | Status: DC

## 2019-04-27 MED ORDER — ATORVASTATIN CALCIUM 80 MG PO TABS: 80.0000 mg | ORAL_TABLET | Freq: Every day | ORAL | 1 refills | Status: DC

## 2019-04-27 MED ORDER — GABAPENTIN 300 MG PO CAPS: 300.0000 mg | ORAL_CAPSULE | Freq: Three times a day (TID) | ORAL | 1 refills | Status: DC

## 2019-04-27 NOTE — Progress Notes (Signed)
  [facility] Adult Case Management Discharge Plan :  Will you be returning to the same living situation after discharge:  Yes,  pt reports that he is returning to the home with his mother then going to beach with some friends. At discharge, do you have transportation home?: Yes,  pt reports that his car is on campus. Do you have the ability to pay for your medications: No.  Release of information consent forms completed and in the chart;  Patient's signature needed at discharge.  Patient to Follow up at: Follow-up Information    [place] Follow up.   Why: You are scheduled for SAIOP on Wednesday, February 1st at 930am. Please follow up with Theodosia Blender. Thank you. Contact information: [place] [address] [phone]           Next level of care provider has access to [place] and Suicide Prevention discussed: No.  Pt declined.     Has patient been referred to the Quitline?: Patient refused referral  Patient has been referred for addiction treatment: Pt. refused referral  Rozann Lesches, LCSW 04/27/2019, 9:39 AM

## 2019-04-27 NOTE — Progress Notes (Signed)
Recreation Therapy Notes  INPATIENT RECREATION TR PLAN  Patient Details Name: JACQUEL MCCAMISH MRN: [id number] DOB: Feb 02, 1971 Today's Date: 04/27/2019  Rec Therapy Plan Is patient appropriate for Therapeutic Recreation?: Yes Treatment times per week: at least 3 Estimated Length of Stay: 5-7 days TR Treatment/Interventions: Group participation (Comment)  Discharge Criteria Pt will be discharged from therapy if:: Discharged Treatment plan/goals/alternatives discussed and agreed upon by:: Patient/family  Discharge Summary Short term goals set: Patient will engage in groups without prompting or encouragement from LRT x3 group sessions within 5 recreation therapy group sessions Short term goals met: Complete Progress toward goals comments: Groups attended Which groups?: Other (Comment), Self-esteem(Necessities, Relaxation) Reason patient discharged from therapy: Discharge from hospital Pt/family agrees with progress & goals achieved: Yes Date patient discharged from therapy: 04/27/19   Damek Ende 04/27/2019, 4:20 PM

## 2019-04-27 NOTE — Tx Team (Signed)
Interdisciplinary Treatment and Diagnostic Plan Update  04/27/2019 Time of Session: 830am Christian [name] MRN: [id number]  Principal Diagnosis: MDD (major depressive disorder)  Secondary Diagnoses: Principal Problem:   MDD (major depressive disorder) Active Problems:   CAD (coronary artery disease)   HTN (hypertension)   Hyperlipidemia   Diabetes mellitus, type II ([place])   Alcohol dependence in early, early partial, sustained full, or sustained partial remission ([place])   Current Medications:  Current Facility-Administered Medications  Medication Dose Route Frequency Provider Last Rate Last Admin  . acetaminophen (TYLENOL) tablet 650 mg  650 mg Oral Q6H PRN Cristofano, Dorene Ar, MD      . alum & mag hydroxide-simeth (MAALOX/MYLANTA) 200-200-20 MG/5ML suspension 30 mL  30 mL Oral Q4H PRN Cristofano, Paul A, MD      . aspirin chewable tablet 81 mg  81 mg Oral Daily Cristofano, Dorene Ar, MD   81 mg at 04/27/19 0815  . atorvastatin (LIPITOR) tablet 80 mg  80 mg Oral q1800 Cristofano, Dorene Ar, MD   80 mg at 04/26/19 1653  . carvedilol (COREG) tablet 25 mg  25 mg Oral BID WC Cristofano, Dorene Ar, MD   25 mg at 04/26/19 0747  . gabapentin (NEURONTIN) capsule 300 mg  300 mg Oral TID Johnn Hai, MD   300 mg at 04/27/19 0815  . losartan (COZAAR) tablet 100 mg  100 mg Oral Daily Cristofano, Dorene Ar, MD   100 mg at 04/26/19 0747  . magnesium hydroxide (MILK OF MAGNESIA) suspension 30 mL  30 mL Oral Daily PRN Cristofano, Dorene Ar, MD      . metFORMIN (GLUCOPHAGE) tablet 1,000 mg  1,000 mg Oral BID WC Cristofano, Dorene Ar, MD   1,000 mg at 04/27/19 0815   PTA Medications: Medications Prior to Admission  Medication Sig Dispense Refill Last Dose  . canagliflozin (INVOKANA) 100 MG TABS tablet Take 1 tablet (100 mg total) by mouth daily before breakfast. 30 tablet 0 Past Week at Unknown time  . gabapentin (NEURONTIN) 100 MG capsule Take 2 capsules (200 mg total) by mouth 3 (three) times daily. 180 capsule 1 Past Week  at Unknown time  . [DISCONTINUED] aspirin 81 MG chewable tablet Chew 1 tablet (81 mg total) by mouth daily. 30 tablet 1 Past Week at Unknown time  . [DISCONTINUED] atorvastatin (LIPITOR) 80 MG tablet Take 1 tablet (80 mg total) by mouth daily at 6 PM. 30 tablet 0 Past Week at Unknown time  . [DISCONTINUED] carvedilol (COREG) 25 MG tablet Take 1 tablet (25 mg total) by mouth 2 (two) times daily with a meal. 60 tablet 1 Past Week at Unknown time  . [DISCONTINUED] losartan (COZAAR) 100 MG tablet Take 1 tablet (100 mg total) by mouth daily. 30 tablet 1 Past Week at Unknown time  . [DISCONTINUED] metFORMIN (GLUCOPHAGE) 1000 MG tablet Take 1 tablet (1,000 mg total) by mouth 2 (two) times daily with a meal. 60 tablet 1 Past Week at Unknown time  . [DISCONTINUED] mirtazapine (REMERON) 30 MG tablet Take 1 tablet (30 mg total) by mouth at bedtime. 30 tablet 1 Past Week at Unknown time  . [DISCONTINUED] traZODone (DESYREL) 50 MG tablet Take 1 tablet (50 mg total) by mouth at bedtime as needed for sleep. (Patient taking differently: Take 50 mg by mouth at bedtime. ) 30 tablet 1 Past Week at Unknown time    Patient Stressors: Financial difficulties Health problems Marital or family conflict  Patient Strengths: Ability for insight Active sense of humor  Average or above average intelligence Capable of independent living Communication skills  Treatment Modalities: Medication Management, Group therapy, Case management,  1 to 1 session with clinician, Psychoeducation, Recreational therapy.   Physician Treatment Plan for Primary Diagnosis: MDD (major depressive disorder) Long Term Goal(s): Improvement in symptoms so as ready for discharge Improvement in symptoms so as ready for discharge   Short Term Goals: Ability to verbalize feelings will improve Ability to demonstrate self-control will improve Ability to disclose and discuss suicidal ideas Ability to maintain clinical measurements within normal  limits will improve Compliance with prescribed medications will improve  Medication Management: Evaluate patient's response, side effects, and tolerance of medication regimen.  Therapeutic Interventions: 1 to 1 sessions, Unit Group sessions and Medication administration.  Evaluation of Outcomes: Adequate for Discharge  Physician Treatment Plan for Secondary Diagnosis: Principal Problem:   MDD (major depressive disorder) Active Problems:   CAD (coronary artery disease)   HTN (hypertension)   Hyperlipidemia   Diabetes mellitus, type II ([facility])   Alcohol dependence in early, early partial, sustained full, or sustained partial remission ([place])  Long Term Goal(s): Improvement in symptoms so as ready for discharge Improvement in symptoms so as ready for discharge   Short Term Goals: Ability to verbalize feelings will improve Ability to demonstrate self-control will improve Ability to disclose and discuss suicidal ideas Ability to maintain clinical measurements within normal limits will improve Compliance with prescribed medications will improve     Medication Management: Evaluate patient's response, side effects, and tolerance of medication regimen.  Therapeutic Interventions: 1 to 1 sessions, Unit Group sessions and Medication administration.  Evaluation of Outcomes: Adequate for Discharge   RN Treatment Plan for Primary Diagnosis: MDD (major depressive disorder) Long Term Goal(s): Knowledge of disease and therapeutic regimen to maintain health will improve  Short Term Goals: Ability to participate in decision making will improve, Ability to verbalize feelings will improve, Ability to disclose and discuss suicidal ideas, Ability to identify and develop effective coping behaviors will improve and Compliance with prescribed medications will improve  Medication Management: RN will administer medications as ordered by provider, will assess and evaluate patient's response and provide  education to patient for prescribed medication. RN will report any adverse and/or side effects to prescribing provider.  Therapeutic Interventions: 1 on 1 counseling sessions, Psychoeducation, Medication administration, Evaluate responses to treatment, Monitor vital signs and CBGs as ordered, Perform/monitor CIWA, COWS, AIMS and Fall Risk screenings as ordered, Perform wound care treatments as ordered.  Evaluation of Outcomes: Adequate for Discharge   LCSW Treatment Plan for Primary Diagnosis: MDD (major depressive disorder) Long Term Goal(s): Safe transition to appropriate next level of care at discharge, Engage patient in therapeutic group addressing interpersonal concerns.  Short Term Goals: Engage patient in aftercare planning with referrals and resources  Therapeutic Interventions: Assess for all discharge needs, 1 to 1 time with Social worker, Explore available resources and support systems, Assess for adequacy in community support network, Educate family and significant other(s) on suicide prevention, Complete Psychosocial Assessment, Interpersonal group therapy.  Evaluation of Outcomes: Adequate for Discharge   Progress in Treatment: Attending groups: Yes. Participating in groups: No. Taking medication as prescribed: Yes. Toleration medication: Yes. Family/Significant other contact made: Yes, individual(s) contacted:  pt declined family contact Patient understands diagnosis: Yes. Discussing patient identified problems/goals with staff: Yes. Medical problems stabilized or resolved: Yes. Denies suicidal/homicidal ideation: Yes. Issues/concerns per patient self-inventory: No. Other: NA  New problem(s) identified: No, Describe:  None reported  New Short  Term/Long Term Goal(s):Attend outpatient treatment,take medication as prescribed, develop and implement healthy coping methods  Patient Goals:  "Get over the thought of wanting to kill myself'  Discharge Plan or Barriers: Pt  will return home and follow up at [facility] where he is an existing patient. Update 04/27/19- Pt is scheduled for discharge today and will follow up at [facility] where he is an existing patient.  Reason for Continuation of Hospitalization: Medication stabilization Suicidal ideation  Estimated Length of Stay: D/C 04/27/19   Recreational Therapy: Patient: N/A Patient Goal: Patient will engage in groups without prompting or encouragement from LRT x3 group sessions within 5 recreation therapy group sessions.  Attendees: Patient: 04/27/2019 11:36 AM  Physician: Alethia Berthold 04/27/2019 11:36 AM  Nursing: Collier Bullock 04/27/2019 11:36 AM  RN Care Manager: 04/27/2019 11:36 AM  Social Worker: Sanjuana Kava [facility] 04/27/2019 11:36 AM  Recreational Therapist:  04/27/2019 11:36 AM  Other:  04/27/2019 11:36 AM  Other:  04/27/2019 11:36 AM  Other: 04/27/2019 11:36 AM    Scribe for Treatment Team: Yvette Rack, LCSW 04/27/2019 11:36 AM

## 2019-04-27 NOTE — Discharge Summary (Signed)
Physician Discharge Summary Note  Patient:  Christian [name] is an 49 y.o., male MRN:  [id number] DOB:  29-Jun-1970 Patient phone:  [phone] (home)  Patient address:   [address] [place] [address],  Total Time spent with patient: 30 minutes  Date of Admission:  04/21/2019 Date of Discharge: April 27, 2019  Reason for Admission: Admitted through the emergency room where he presented after an overdose on trazodone.  Patient became acutely upset and depressed after having an argument with his mother.  Principal Problem: MDD (major depressive disorder) Discharge Diagnoses: Principal Problem:   MDD (major depressive disorder) Active Problems:   CAD (coronary artery disease)   HTN (hypertension)   Hyperlipidemia   Diabetes mellitus, type II (HCC)   Alcohol dependence in early, early partial, sustained full, or sustained partial remission ([place])   Past Psychiatric History: Patient has a long history of depression and situational stress.  History of alcohol abuse currently under pretty good control.  Has several medical problems and also has some ongoing stress related to legal issues.  (Child-support) patient does have a past history of suicidality but also a past history of compliance with outpatient treatment  Past Medical History:  Past Medical History:  Diagnosis Date  . CAD (coronary artery disease)    a. 07/2013 NSTEMI/PCI: LCX 20m(4.0x23 Xience DES), RCA 429mEF > 55%;  b. 07/2013 Echo: EF 60-65%, diast dysfxn, mild LVH, mildly dil LA, nl RV size/fxn, nl RVSP.  . Marland Kitchenistory of tobacco abuse    a. quit ~ 2010  . Hyperlipidemia   . Morbid obesity (HCFriendly   a. weighed 168 @ age 49 . Scoliosis     Past Surgical History:  Procedure Laterality Date  . ADENOIDECTOMY    . CARDIAC CATHETERIZATION  07/2013   [facility]'x1 stent   Family History:  Family History  Problem Relation Age of Onset  . Heart disease Mother   . Esophageal cancer Father    Family Psychiatric  History: See  previous.  Nothing specific Social History:  Social History   Substance and Sexual Activity  Alcohol Use Yes     Social History   Substance and Sexual Activity  Drug Use No  . Types: Marijuana   Comment: past    Social History   Socioeconomic History  . Marital status: Single    Spouse name: Not on file  . Number of children: Not on file  . Years of education: Not on file  . Highest education level: Not on file  Occupational History  . Not on file  Tobacco Use  . Smoking status: Former Smoker    Packs/day: 0.50    Years: 20.00    Pack years: 10.00    Types: Cigarettes  . Smokeless tobacco: Never Used  Substance and Sexual Activity  . Alcohol use: Yes  . Drug use: No    Types: Marijuana    Comment: past  . Sexual activity: Not on file  Other Topics Concern  . Not on file  Social History Narrative  . Not on file   Social Determinants of Health   Financial Resource Strain:   . Difficulty of Paying Living Expenses: Not on file  Food Insecurity:   . Worried About RuCharity fundraisern the Last Year: Not on file  . Ran Out of Food in the Last Year: Not on file  Transportation Needs:   . Lack of Transportation (Medical): Not on file  . Lack of  Transportation (Non-Medical): Not on file  Physical Activity:   . Days of Exercise per Week: Not on file  . Minutes of Exercise per Session: Not on file  Stress:   . Feeling of Stress : Not on file  Social Connections:   . Frequency of Communication with Friends and Family: Not on file  . Frequency of Social Gatherings with Friends and Family: Not on file  . Attends Religious Services: Not on file  . Active Member of Clubs or Organizations: Not on file  . Attends Archivist Meetings: Not on file  . Marital Status: Not on file    Hospital Course: 15-minute checks maintained.  Patient was engaged in usual assessment in individual and group therapy.  Medications continued including medication for depression and  anxiety.  Patient did not have any behavior problems on the unit.  No self-harm no aggression no violence.  He was cooperative with treatment and showed good insight.  He met with the representative from [facility] who is very familiar with him because of the patient's participation in in the substance abuse outpatient program.  Patient has formed a plan for managing his stress in the near term.  Denies suicidal ideation.  Agrees to continued outpatient follow-up.  Physical Findings: AIMS: Facial and Oral Movements Muscles of Facial Expression: None, normal Lips and Perioral Area: None, normal Jaw: None, normal Tongue: None, normal,Extremity Movements Upper (arms, wrists, hands, fingers): None, normal Lower (legs, knees, ankles, toes): None, normal, Trunk Movements Neck, shoulders, hips: None, normal, Overall Severity Severity of abnormal movements (highest score from questions above): None, normal Incapacitation due to abnormal movements: None, normal Patient's awareness of abnormal movements (rate only patient's report): No Awareness, Dental Status Current problems with teeth and/or dentures?: No Does patient usually wear dentures?: No  CIWA:    COWS:     Musculoskeletal: Strength & Muscle Tone: within normal limits Gait & Station: normal Patient leans: N/A  Psychiatric Specialty Exam: Physical Exam  Nursing note and vitals reviewed. Constitutional: He appears well-developed and well-nourished.  HENT:  Head: Normocephalic and atraumatic.  Eyes: Pupils are equal, round, and reactive to light. Conjunctivae are normal.  Cardiovascular: Regular rhythm and normal heart sounds.  Respiratory: Effort normal. No respiratory distress.  GI: Soft.  Musculoskeletal:        General: Normal range of motion.     Cervical back: Normal range of motion.  Neurological: He is alert.  Skin: Skin is warm and dry.  Psychiatric: He has a normal mood and affect. His speech is normal and behavior is normal.  Judgment and thought content normal. Cognition and memory are normal.    Review of Systems  Constitutional: Negative.   HENT: Negative.   Eyes: Negative.   Respiratory: Negative.   Cardiovascular: Negative.   Gastrointestinal: Negative.   Musculoskeletal: Negative.   Skin: Negative.   Neurological: Negative.   Psychiatric/Behavioral: Positive for dysphoric mood. Negative for behavioral problems, confusion, decreased concentration, hallucinations and suicidal ideas. The patient is nervous/anxious. The patient is not hyperactive.     Blood pressure 103/70, pulse 78, temperature 98.1 F (36.7 C), temperature source Oral, resp. rate 18, height '5\' 6"'$  (1.676 m), weight 77.6 kg, SpO2 100 %.Body mass index is 27.6 kg/m.  General Appearance: Casual  Eye Contact:  Good  Speech:  Clear and Coherent  Volume:  Normal  Mood:  Euthymic  Affect:  Congruent  Thought Process:  Goal Directed  Orientation:  Full (Time, Place, and Person)  Thought  Content:  Logical  Suicidal Thoughts:  No  Homicidal Thoughts:  No  Memory:  Immediate;   Fair Recent;   Fair Remote;   Fair  Judgement:  Fair  Insight:  Fair  Psychomotor Activity:  Normal  Concentration:  Concentration: Fair  Recall:  [place] of Knowledge:  Fair  Language:  Fair  Akathisia:  No  Handed:  Right  AIMS (if indicated):     Assets:  Desire for Improvement Housing Resilience  ADL's:  Intact  Cognition:  WNL  Sleep:  Number of Hours: 7.25        Has this patient used any form of tobacco in the last 30 days? (Cigarettes, Smokeless Tobacco, Cigars, and/or Pipes) Yes, Yes, A prescription for an FDA-approved tobacco cessation medication was offered at discharge and the patient refused  Blood Alcohol level:  Lab Results  Component Value Date   [facility] <10 04/20/2019   ETH <10 26/71/2458    Metabolic Disorder Labs:  Lab Results  Component Value Date   HGBA1C 9.6 (H) 04/22/2019   MPG 228.82 04/22/2019   MPG 202.99 02/23/2019    No results found for: PROLACTIN Lab Results  Component Value Date   CHOL 218 (H) 04/22/2019   TRIG 352 (H) 04/22/2019   HDL 38 (L) 04/22/2019   CHOLHDL 5.7 04/22/2019   VLDL 70 (H) 04/22/2019   LDLCALC 110 (H) 04/22/2019   [place] 68 02/23/2019    See Psychiatric Specialty Exam and Suicide Risk Assessment completed by Attending Physician prior to discharge.  Discharge destination:  Home  Is patient on multiple antipsychotic therapies at discharge:  No   Has Patient had three or more failed trials of antipsychotic monotherapy by history:  No  Recommended Plan for Multiple Antipsychotic Therapies: NA  Discharge Instructions    Diet - low sodium heart healthy   Complete by: As directed    Increase activity slowly   Complete by: As directed      Allergies as of 04/27/2019   No Known Allergies     Medication List    TAKE these medications     Indication  aspirin 81 MG chewable tablet Chew 1 tablet (81 mg total) by mouth daily.  Indication: Stable Angina Pectoris, Coronary artery disease   atorvastatin 80 MG tablet Commonly known as: LIPITOR Take 1 tablet (80 mg total) by mouth daily at 6 PM.  Indication: High Amount of Fats in the Blood   canagliflozin 100 MG Tabs tablet Commonly known as: Invokana Take 1 tablet (100 mg total) by mouth daily before breakfast.  Indication: Diabetes with Kidney Disease   carvedilol 25 MG tablet Commonly known as: COREG Take 1 tablet (25 mg total) by mouth 2 (two) times daily with a meal.  Indication: High Blood Pressure Disorder   gabapentin 300 MG capsule Commonly known as: NEURONTIN Take 1 capsule (300 mg total) by mouth 3 (three) times daily. What changed:   medication strength  how much to take  Indication: Fibromyalgia Syndrome   losartan 100 MG tablet Commonly known as: COZAAR Take 1 tablet (100 mg total) by mouth daily.  Indication: High Blood Pressure Disorder   metFORMIN 1000 MG tablet Commonly known as:  GLUCOPHAGE Take 1 tablet (1,000 mg total) by mouth 2 (two) times daily with a meal.  Indication: Type 2 Diabetes   mirtazapine 30 MG tablet Commonly known as: REMERON Take 1 tablet (30 mg total) by mouth at bedtime.  Indication: Major Depressive Disorder   traZODone  50 MG tablet Commonly known as: DESYREL Take 1 tablet (50 mg total) by mouth at bedtime as needed for sleep. What changed:   when to take this  reasons to take this  Indication: [place] Follow up.   Why: You are scheduled for SAIOP on Wednesday, February 1st at 930am. Please follow up with Theodosia Blender. Thank you. Contact information: [place] [address] [phone]           Follow-up recommendations:  Activity:  Activity as tolerated Diet:  Diabetic diet Other:  Follow-up with outpatient treatment through [facility]  Comments: Supportive counseling and encouragement.  7-day supply and prescriptions given at discharge  Signed: Alethia Berthold, MD 04/27/2019, 10:35 AM

## 2019-04-27 NOTE — Progress Notes (Signed)
D- Patient alert and oriented. Patient was in a depressed mood with a flat affect.  Patient endorses passive SI.  Patient contracts for safety.  Denies HI, AVH, and pain.  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  Patient could not identify any coping skills that he could utilize to help with his stressors upon discharge.  Patient has no other complaints at this time.     A- Support and encouragement provided.  Routine safety checks conducted every 15 minutes.  Patient informed to notify staff with problems or concerns. R- Patient calm and cooperative. Patient remains safe at this time.Patient has been sleep during this shift.

## 2019-04-27 NOTE — Progress Notes (Signed)
Recreation Therapy Notes   Date: 04/27/2019  Time: 9:30 am   Location: Craft room   Behavioral response: N/A   Intervention Topic: Self-care   Discussion/Intervention: Patient did not attend group.   Clinical Observations/Feedback:  Patient did not attend group.   Challen Spainhour LRT/CTRS        Jakita Dutkiewicz 04/27/2019 11:39 AM

## 2019-04-27 NOTE — Plan of Care (Signed)
  Problem: Group Participation Goal: STG - Patient will engage in groups without prompting or encouragement from LRT x3 group sessions within 5 recreation therapy group sessions Description: STG - Patient will engage in groups without prompting or encouragement from LRT x3 group sessions within 5 recreation therapy group sessions Outcome: Completed/Met

## 2019-04-27 NOTE — Plan of Care (Signed)
Pt rates depression, anxiety and hopelessness all 5/10. Pt denies  SI, HI and AVH. Pt was educated on care plan and verbalizes understanding. Collier Bullock RN Problem: Education: Goal: Knowledge of [place] [facility] Education information/materials will improve Outcome: Adequate for Discharge   Problem: Coping: Goal: Ability to verbalize frustrations and anger appropriately will improve Outcome: Adequate for Discharge Goal: Ability to demonstrate self-control will improve Outcome: Adequate for Discharge   Problem: Safety: Goal: Periods of time without injury will increase Outcome: Adequate for Discharge   Problem: Activity: Goal: Imbalance in normal sleep/wake cycle will improve Outcome: Adequate for Discharge   Problem: Health Behavior/Discharge Planning: Goal: Ability to make decisions will improve Outcome: Adequate for Discharge   Problem: Medication: Goal: Compliance with prescribed medication regimen will improve Outcome: Adequate for Discharge   Problem: Self-Concept: Goal: Ability to disclose and discuss suicidal ideas will improve Outcome: Adequate for Discharge Goal: Will verbalize positive feelings about self Outcome: Adequate for Discharge   Problem: Education: Goal: Ability to state activities that reduce stress will improve Outcome: Adequate for Discharge   Problem: Self-Concept: Goal: Ability to modify response to factors that promote anxiety will improve Outcome: Adequate for Discharge

## 2019-04-27 NOTE — Progress Notes (Signed)
Pt denies depression, anxiety , SI, HI and AVH. Pt was educated on dc plan and verbalizes understanding. Pt received belongings, dc packet , prescriptions and medication supply. Pt was dc though ED to his personal car to go home. Collier Bullock RN

## 2019-04-27 NOTE — [facility] Suicide Risk Assessment (Signed)
[facility] Discharge Suicide Risk Assessment   Principal Problem: MDD (major depressive disorder) Discharge Diagnoses: Principal Problem:   MDD (major depressive disorder) Active Problems:   CAD (coronary artery disease)   HTN (hypertension)   Hyperlipidemia   Diabetes mellitus, type II ([place])   Alcohol dependence in early, early partial, sustained full, or sustained partial remission ([place])   Total Time spent with patient: 30 minutes  Musculoskeletal: Strength & Muscle Tone: within normal limits Gait & Station: normal Patient leans: N/A  Psychiatric Specialty Exam: Review of Systems  Constitutional: Negative.   HENT: Negative.   Eyes: Negative.   Respiratory: Negative.   Cardiovascular: Negative.   Gastrointestinal: Negative.   Musculoskeletal: Negative.   Skin: Negative.   Neurological: Negative.   Psychiatric/Behavioral: Positive for dysphoric mood. Negative for hallucinations, self-injury and suicidal ideas. The patient is not nervous/anxious.     Blood pressure 103/70, pulse 78, temperature 98.1 F (36.7 C), temperature source Oral, resp. rate 18, height 5\' 6"  (1.676 m), weight 77.6 kg, SpO2 100 %.Body mass index is 27.6 kg/m.  General Appearance: Casual  Eye Contact::  Good  Speech:  Clear and [id number]  Volume:  Normal  Mood:  Euthymic  Affect:  Constricted  Thought Process:  Goal Directed  Orientation:  Full (Time, Place, and Person)  Thought Content:  Logical  Suicidal Thoughts:  No  Homicidal Thoughts:  No  Memory:  Immediate;   Fair Recent;   Fair Remote;   Fair  Judgement:  Fair  Insight:  Fair  Psychomotor Activity:  Normal  Concentration:  Fair  Recall:  [employer] of Knowledge:Fair  Language: Fair  Akathisia:  No  Handed:  Right  AIMS (if indicated):     Assets:  Desire for Improvement Housing Physical Health Resilience  Sleep:  Number of Hours: 7.25  Cognition: WNL  ADL's:  Intact   Mental Status Per Nursing Assessment::   On Admission:   Suicidal ideation indicated by patient  Demographic Factors:  Male, Divorced or widowed, Caucasian and Low socioeconomic status  Loss Factors: Financial problems/change in socioeconomic status  Historical Factors: Impulsivity  Risk Reduction Factors:   Responsible for children under 50 years of age, Sense of responsibility to family, Living with another person, especially a relative, Positive social support and Positive therapeutic relationship  Continued Clinical Symptoms:  Depression:   Insomnia  Cognitive Features That Contribute To Risk:  Polarized thinking    Suicide Risk:  Minimal: No identifiable suicidal ideation.  Patients presenting with no risk factors but with morbid ruminations; may be classified as minimal risk based on the severity of the depressive symptoms  Follow-up Information    [place] Follow up.   Why: You are scheduled for SAIOP on Wednesday, February 1st at 930am. Please follow up with Theodosia Blender. Thank you. Contact information: [place] [address] [phone]           Plan Of Care/Follow-up recommendations:  Activity:  Activity as tolerated Diet:  Regular diet Other:  Follow-up with [facility]  Alethia Berthold, MD 04/27/2019, 10:29 AM

## 2019-04-27 NOTE — Progress Notes (Signed)
9 hours of sleep

## 2019-08-12 DIAGNOSIS — Z7984 Long term (current) use of oral hypoglycemic drugs: Secondary | ICD-10-CM | POA: Diagnosis not present

## 2019-08-12 DIAGNOSIS — Z7982 Long term (current) use of aspirin: Secondary | ICD-10-CM | POA: Diagnosis not present

## 2019-08-12 DIAGNOSIS — Z8673 Personal history of transient ischemic attack (TIA), and cerebral infarction without residual deficits: Secondary | ICD-10-CM | POA: Diagnosis not present

## 2019-08-12 DIAGNOSIS — I2510 Atherosclerotic heart disease of native coronary artery without angina pectoris: Secondary | ICD-10-CM | POA: Diagnosis not present

## 2019-08-12 DIAGNOSIS — F10230 Alcohol dependence with withdrawal, uncomplicated: Secondary | ICD-10-CM | POA: Insufficient documentation

## 2019-08-12 DIAGNOSIS — E1165 Type 2 diabetes mellitus with hyperglycemia: Secondary | ICD-10-CM | POA: Diagnosis not present

## 2019-08-12 DIAGNOSIS — Z79899 Other long term (current) drug therapy: Secondary | ICD-10-CM | POA: Insufficient documentation

## 2019-08-12 DIAGNOSIS — Z87891 Personal history of nicotine dependence: Secondary | ICD-10-CM | POA: Diagnosis not present

## 2019-08-12 DIAGNOSIS — R112 Nausea with vomiting, unspecified: Secondary | ICD-10-CM | POA: Insufficient documentation

## 2019-08-12 DIAGNOSIS — I10 Essential (primary) hypertension: Secondary | ICD-10-CM | POA: Insufficient documentation

## 2019-08-12 DIAGNOSIS — R531 Weakness: Secondary | ICD-10-CM | POA: Diagnosis present

## 2019-08-13 ENCOUNTER — Emergency Department: Payer: Medicaid Other

## 2019-08-13 ENCOUNTER — Other Ambulatory Visit: Payer: Self-pay

## 2019-08-13 ENCOUNTER — Encounter: Payer: Self-pay | Admitting: Emergency Medicine

## 2019-08-13 ENCOUNTER — Emergency Department
Admission: EM | Admit: 2019-08-13 | Discharge: 2019-08-13 | Disposition: A | Discharge status: Home / Self Care | Payer: Medicaid Other | Attending: Emergency Medicine | Admitting: Emergency Medicine

## 2019-08-13 DIAGNOSIS — F10930 Alcohol use, unspecified with withdrawal, uncomplicated: Secondary | ICD-10-CM

## 2019-08-13 DIAGNOSIS — I10 Essential (primary) hypertension: Secondary | ICD-10-CM

## 2019-08-13 DIAGNOSIS — R739 Hyperglycemia, unspecified: Secondary | ICD-10-CM

## 2019-08-13 LAB — URINALYSIS, COMPLETE (UACMP) WITH MICROSCOPIC
Bacteria, UA: NONE SEEN
Bilirubin Urine: NEGATIVE
Glucose, UA: >=500 mg/dL — AB
Hgb urine dipstick: NEGATIVE
Ketones, ur: 20 mg/dL — AB
Leukocytes,Ua: NEGATIVE
Nitrite: NEGATIVE
Protein, ur: NEGATIVE mg/dL
Specific Gravity, Urine: 1.028 (ref 1.005–1.030)
Squamous Epithelial / HPF: NONE SEEN (ref 0–5)
WBC, UA: NONE SEEN WBC/hpf (ref 0–5)
pH: 5 (ref 5.0–8.0)

## 2019-08-13 LAB — COMPREHENSIVE METABOLIC PANEL
ALT: 19 U/L (ref 0–44)
AST: 19 U/L (ref 15–41)
Albumin: 4.6 g/dL (ref 3.5–5.0)
Alkaline Phosphatase: 69 U/L (ref 38–126)
Anion gap: 13 (ref 5–15)
BUN: 14 mg/dL (ref 6–20)
CO2: 24 mmol/L (ref 22–32)
Calcium: 9.5 mg/dL (ref 8.9–10.3)
Chloride: 100 mmol/L (ref 98–111)
Creatinine, Ser: 0.73 mg/dL (ref 0.61–1.24)
GFR calc Af Amer: >60 mL/min (ref >60)
GFR calc non Af Amer: >60 mL/min (ref >60)
Glucose, Bld: 498 mg/dL — ABNORMAL HIGH (ref 70–99)
Potassium: 3.6 mmol/L (ref 3.5–5.1)
Sodium: 137 mmol/L (ref 135–145)
Total Bilirubin: 1.2 mg/dL (ref 0.3–1.2)
Total Protein: 7.6 g/dL (ref 6.5–8.1)

## 2019-08-13 LAB — CBC WITH DIFFERENTIAL/PLATELET
Abs Immature Granulocytes: 0.12 10*3/uL — ABNORMAL HIGH (ref 0.00–0.07)
Basophils Absolute: 0.1 10*3/uL (ref 0.0–0.1)
Basophils Relative: 1 %
Eosinophils Absolute: 0.1 10*3/uL (ref 0.0–0.5)
Eosinophils Relative: 1 %
HCT: 43.7 % (ref 39.0–52.0)
Hemoglobin: 15.7 g/dL (ref 13.0–17.0)
Immature Granulocytes: 1 %
Lymphocytes Relative: 15 %
Lymphs Abs: 2.2 10*3/uL (ref 0.7–4.0)
MCH: 31.3 pg (ref 26.0–34.0)
MCHC: 35.9 g/dL (ref 30.0–36.0)
MCV: 87.2 fL (ref 80.0–100.0)
Monocytes Absolute: 0.6 10*3/uL (ref 0.1–1.0)
Monocytes Relative: 4 %
Neutro Abs: 11.8 10*3/uL — ABNORMAL HIGH (ref 1.7–7.7)
Neutrophils Relative %: 78 %
Platelets: 255 10*3/uL (ref 150–400)
RBC: 5.01 MIL/uL (ref 4.22–5.81)
RDW: 14.7 % (ref 11.5–15.5)
WBC: 15 10*3/uL — ABNORMAL HIGH (ref 4.0–10.5)
nRBC: 0 % (ref 0.0–0.2)

## 2019-08-13 LAB — TROPONIN I (HIGH SENSITIVITY): Troponin I (High Sensitivity): 14 ng/L (ref <18)

## 2019-08-13 LAB — GLUCOSE, CAPILLARY
Glucose-Capillary: 378 mg/dL — ABNORMAL HIGH (ref 70–99)
Glucose-Capillary: 425 mg/dL — ABNORMAL HIGH (ref 70–99)
Glucose-Capillary: 331 mg/dL — ABNORMAL HIGH (ref 70–99)

## 2019-08-13 LAB — LIPASE, BLOOD: Lipase: 171 U/L — ABNORMAL HIGH (ref 11–51)

## 2019-08-13 MED ORDER — SODIUM CHLORIDE 0.9 % IV BOLUS
1000.0000 mL | Freq: Once | INTRAVENOUS | Status: AC
  Administered 2019-08-13: 1000 mL via INTRAVENOUS

## 2019-08-13 MED ORDER — CANAGLIFLOZIN 100 MG PO TABS: 100.0000 mg | ORAL_TABLET | Freq: Every day | ORAL | 0 refills | Status: DC

## 2019-08-13 MED ORDER — LOSARTAN POTASSIUM 100 MG PO TABS: 100.0000 mg | ORAL_TABLET | Freq: Every day | ORAL | 1 refills | Status: DC

## 2019-08-13 MED ORDER — AMLODIPINE BESYLATE 5 MG PO TABS
10.0000 mg | ORAL_TABLET | Freq: Once | ORAL | Status: AC
  Administered 2019-08-13: 10 mg via ORAL
  Filled 2019-08-13: qty 2

## 2019-08-13 MED ORDER — LORAZEPAM 2 MG/ML IJ SOLN
1.0000 mg | Freq: Once | INTRAMUSCULAR | Status: AC
  Administered 2019-08-13: 1 mg via INTRAVENOUS
  Filled 2019-08-13: qty 1

## 2019-08-13 MED ORDER — CARVEDILOL 25 MG PO TABS: 25.0000 mg | ORAL_TABLET | Freq: Two times a day (BID) | ORAL | 1 refills | Status: DC

## 2019-08-13 MED ORDER — INSULIN ASPART 100 UNIT/ML ~~LOC~~ SOLN
8.0000 [IU] | Freq: Once | SUBCUTANEOUS | Status: AC
  Administered 2019-08-13: 8 [IU] via INTRAVENOUS
  Filled 2019-08-13: qty 1

## 2019-08-13 MED ORDER — ATORVASTATIN CALCIUM 80 MG PO TABS: 80.0000 mg | ORAL_TABLET | Freq: Every day | ORAL | 1 refills | Status: DC

## 2019-08-13 MED ORDER — METFORMIN HCL 1000 MG PO TABS: 1000.0000 mg | ORAL_TABLET | Freq: Two times a day (BID) | ORAL | 1 refills | Status: DC

## 2019-08-13 MED ORDER — LORAZEPAM 2 MG/ML IJ SOLN
1.0000 mg | Freq: Once | INTRAMUSCULAR | Status: AC
  Administered 2019-08-13: 1 mg via INTRAVENOUS

## 2019-08-13 MED ORDER — METFORMIN HCL 500 MG PO TABS
1000.0000 mg | ORAL_TABLET | Freq: Once | ORAL | Status: AC
  Administered 2019-08-13: 1000 mg via ORAL
  Filled 2019-08-13: qty 2

## 2019-08-13 NOTE — ED Triage Notes (Signed)
Pt to triage via w/c with no distress noted, mask in place; pt reports weakness, N/V since 10pm; denies any pain at present

## 2019-08-13 NOTE — ED Notes (Signed)
Pt changed into paper pants and assisted to the lobby. RN showed patient how to use the phone and pt reports he will call his son.

## 2019-08-13 NOTE — ED Notes (Signed)
Pt reports last drink was on Saturday but does admit to drinking heavily.

## 2019-08-13 NOTE — ED Provider Notes (Signed)
[facility] Emergency Department Provider Note  ____________________________________________   First MD Initiated Contact with Patient 08/13/19 [phone]     (approximate)  I have reviewed the triage vital signs and the nursing notes.   HISTORY  Chief Complaint Weakness    HPI Christian [name] is a 49 y.o. male with below list of previous medical conditions including alcohol abuse hypertension diabetes presents to the emergency department secondary to elevated blood pressure at home with systolic blood pressure exceeding 200 per the patient.  Patient also admits to weakness nausea and vomiting since 10 PM.  On arrival to the emergency department the patient's blood pressure was 229/117 and tachycardic to 102.  Patient states that he has been unable to obtain his prescribed medications secondary to not having any money.  Patient also states that his last EtOH ingestion was on Saturday secondary to the same.  Patient states that he normally drinks daily.       Past Medical History:  Diagnosis Date  . CAD (coronary artery disease)    a. 07/2013 NSTEMI/PCI: LCX 39m (4.0x23 Xience DES), RCA 58m, EF > 55%;  b. 07/2013 Echo: EF 60-65%, diast dysfxn, mild LVH, mildly dil LA, nl RV size/fxn, nl RVSP.  Marland Kitchen History of tobacco abuse    a. quit ~ 2010  . Hyperlipidemia   . Morbid obesity ([place])    a. weighed 168 @ age 27.  . Scoliosis     Patient Active Problem List   Diagnosis Date Noted  . Alcohol dependence in early, early partial, sustained full, or sustained partial remission ([place]) 04/22/2019  . MDD (major depressive disorder) 04/21/2019  . Acute CVA (cerebrovascular accident) ([place]) 02/23/2019  . Major depression 02/05/2019  . Severe episode of recurrent major depressive disorder, without psychotic features ([place]) 05/29/2018  . Suicidal ideation 05/29/2018  . Alcohol use disorder, moderate, dependence ([place]) 05/29/2018  . Hyperosmolar non-ketotic state in patient with  type 2 diabetes mellitus ([place]) 05/08/2018  . Chest pain 12/30/2017  . CAD (coronary artery disease)   . HTN (hypertension)   . Hyperlipidemia   . Diabetes mellitus, type II ([place])   . Morbid obesity ([place])   . History of tobacco abuse     Past Surgical History:  Procedure Laterality Date  . ADENOIDECTOMY    . CARDIAC CATHETERIZATION  07/2013   [facility]'x1 stent    Prior to Admission medications   Medication Sig Start Date End Date Taking? Authorizing Provider  aspirin 81 MG chewable tablet Chew 1 tablet (81 mg total) by mouth daily. 04/27/19   Clapacs, Madie Reno, MD  atorvastatin (LIPITOR) 80 MG tablet Take 1 tablet (80 mg total) by mouth daily at 6 PM. 04/27/19 05/27/19  Clapacs, Madie Reno, MD  canagliflozin (INVOKANA) 100 MG TABS tablet Take 1 tablet (100 mg total) by mouth daily before breakfast. 02/26/19   Harvie Heck, MD  carvedilol (COREG) 25 MG tablet Take 1 tablet (25 mg total) by mouth 2 (two) times daily with a meal. 04/27/19   Clapacs, Madie Reno, MD  gabapentin (NEURONTIN) 300 MG capsule Take 1 capsule (300 mg total) by mouth 3 (three) times daily. 04/27/19   Clapacs, Madie Reno, MD  losartan (COZAAR) 100 MG tablet Take 1 tablet (100 mg total) by mouth daily. 04/27/19   Clapacs, Madie Reno, MD  metFORMIN (GLUCOPHAGE) 1000 MG tablet Take 1 tablet (1,000 mg total) by mouth 2 (two) times daily with a meal. 04/27/19   Clapacs, Madie Reno, MD  mirtazapine (REMERON) 30 MG tablet Take 1 tablet (30 mg total) by mouth at bedtime. 04/27/19   Clapacs, Madie Reno, MD  traZODone (DESYREL) 50 MG tablet Take 1 tablet (50 mg total) by mouth at bedtime as needed for sleep. 04/27/19   Clapacs, Madie Reno, MD    Allergies Patient has no known allergies.  Family History  Problem Relation Age of Onset  . Heart disease Mother   . Esophageal cancer Father     Social History Social History   Tobacco Use  . Smoking status: Former Smoker    Packs/day: 0.50    Years: 20.00    Pack years: 10.00    Types: Cigarettes  . Smokeless tobacco:  Never Used  Substance Use Topics  . Alcohol use: Yes  . Drug use: No    Types: Marijuana    Comment: past    Review of Systems Constitutional: No fever/chills Eyes: No visual changes. ENT: No sore throat. Cardiovascular: Denies chest pain. Respiratory: Denies shortness of breath. Gastrointestinal: No abdominal pain.  No nausea, no vomiting.  No diarrhea.  No constipation. Genitourinary: Negative for dysuria. Musculoskeletal: Negative for neck pain.  Negative for back pain. Integumentary: Negative for rash. Neurological: Negative for headaches, focal weakness or numbness.    ____________________________________________   PHYSICAL EXAM:  VITAL SIGNS: ED Triage Vitals [08/13/19 0022]  Enc Vitals Group     BP (!) 229/117     Pulse Rate (!) 102     Resp 18     Temp 97.8 F (36.6 C)     Temp Source Oral     SpO2 96 %     Weight 68 kg (150 lb)     Height 1.676 m (5\' 6" )     Head Circumference      Peak Flow      Pain Score 0     Pain Loc      Pain Edu?      Excl. in [place]?     Constitutional: Alert and oriented.  Eyes: Conjunctivae are normal.  Head: Atraumatic. Mouth/Throat: Patient is wearing a mask. Neck: No stridor.  No meningeal signs.   Cardiovascular: Normal rate, regular rhythm. Good peripheral circulation. Grossly normal heart sounds. Respiratory: Normal respiratory effort.  No retractions. Gastrointestinal: Soft and nontender. No distention.  Musculoskeletal: No lower extremity tenderness nor edema. No gross deformities of extremities. Neurologic:  Normal speech and language. No gross focal neurologic deficits are appreciated.  Skin:  Skin is warm, dry and intact. Psychiatric: Mood and affect are normal. Speech and behavior are normal.  ____________________________________________   LABS (all labs ordered are listed, but only abnormal results are displayed)  Labs Reviewed  CBC WITH DIFFERENTIAL/PLATELET - Abnormal; Notable for the following  components:      Result Value   WBC 15.0 (*)    Neutro Abs 11.8 (*)    Abs Immature Granulocytes 0.12 (*)    All other components within normal limits  COMPREHENSIVE METABOLIC PANEL - Abnormal; Notable for the following components:   Glucose, Bld 498 (*)    All other components within normal limits  LIPASE, BLOOD - Abnormal; Notable for the following components:   Lipase 171 (*)    All other components within normal limits  URINALYSIS, COMPLETE (UACMP) WITH MICROSCOPIC - Abnormal; Notable for the following components:   Color, Urine STRAW (*)    APPearance CLEAR (*)    Glucose, UA >=500 (*)    Ketones, ur 20 (*)  All other components within normal limits  GLUCOSE, CAPILLARY - Abnormal; Notable for the following components:   Glucose-Capillary 425 (*)    All other components within normal limits  GLUCOSE, CAPILLARY - Abnormal; Notable for the following components:   Glucose-Capillary 378 (*)    All other components within normal limits  TROPONIN I (HIGH SENSITIVITY)   ____________________________________________  EKG  ED ECG REPORT I, [place] N Kael Keetch, the attending physician, personally viewed and interpreted this ECG.   Date: 08/13/2019  EKG Time: 12:24 AM  Rate: 95  Rhythm: Normal sinus rhythm  Axis: Normal  Intervals: Normal  ST&T Change: None   RADIOLOGY I, [place] N Tanner Vigna, personally viewed and evaluated these images (plain radiographs) as part of my medical decision making, as well as reviewing the written report by the radiologist.  ED MD interpretation:    Official radiology report(s): CT Head Wo Contrast  Result Date: 08/13/2019 CLINICAL DATA:  Initial evaluation for acute headache, normal neuro exam. EXAM: CT HEAD WITHOUT CONTRAST TECHNIQUE: Contiguous axial images were obtained from the base of the skull through the vertex without intravenous contrast. COMPARISON:  Prior CT and MRI from 02/23/2019. FINDINGS: Brain: Generalized age-related cerebral  atrophy with chronic small vessel ischemic disease. There has been interval evolution of previously identified infarct involving the left basal ganglia, now largely chronic in appearance. Additional chronic lacunar infarcts involving the right basal ganglia and thalamus noted. No acute large vessel territory infarct. No acute intracranial hemorrhage. No mass lesion or midline shift. No mass effect. No hydrocephalus or extra-axial fluid collection. Vascular: No hyperdense vessel. Scattered vascular calcifications noted within the carotid siphons. Skull: Scalp soft tissues and calvarium within normal limits. Sinuses/Orbits: Globes and orbital soft tissues within normal limits. Paranasal sinuses are clear. No mastoid effusion. Other: None. IMPRESSION: 1. No acute intracranial abnormality. 2. Interval evolution of previously identified left basal ganglia infarct, now largely chronic in appearance. Additional chronic lacunar infarcts involving the right basal ganglia and thalamus. 3. Generalized age-related cerebral atrophy with chronic small vessel ischemic disease. Electronically Signed   By: Jeannine Boga M.D.   On: 08/13/2019 01:43     Procedures   ____________________________________________   INITIAL IMPRESSION / MDM / ASSESSMENT AND PLAN / ED COURSE  As part of my medical decision making, I reviewed the following data within the electronic MEDICAL RECORD NUMBER   49 year old male presented with above-stated history and physical exam a differential diagnosis including but not limited to alcohol withdrawal, hypertension and hyperglycemia.  Patient was given 2 mg of IV Ativan with marked improvement of blood pressure.  Initial pressure 229/117 current pressure 155/100.  Patient also given 2 L of IV normal saline secondary to blood glucose of 498  ____________________________________________  FINAL CLINICAL IMPRESSION(S) / ED DIAGNOSES  Final diagnoses:  Alcohol withdrawal syndrome without  complication (HCC)  Essential hypertension  Hyperglycemia     MEDICATIONS GIVEN DURING THIS VISIT:  Medications  LORazepam (ATIVAN) injection 1 mg (1 mg Intravenous Given 08/13/19 [id number])  sodium chloride 0.9 % bolus 1,000 mL (0 mLs Intravenous Stopped 08/13/19 0423)  sodium chloride 0.9 % bolus 1,000 mL (0 mLs Intravenous Stopped 08/13/19 0354)  LORazepam (ATIVAN) injection 1 mg (1 mg Intravenous Given 08/13/19 0246)  insulin aspart (novoLOG) injection 8 Units (8 Units Intravenous Given 08/13/19 0353)     ED Discharge Orders    None      *Please note:  Christian [name] was evaluated in Emergency Department on 08/13/2019 for the symptoms  described in the history of present illness. He was evaluated in the context of the global COVID-19 pandemic, which necessitated consideration that the patient might be at risk for infection with the SARS-CoV-2 virus that causes COVID-19. Institutional protocols and algorithms that pertain to the evaluation of patients at risk for COVID-19 are in a state of rapid change based on information released by regulatory bodies including the CDC and federal and state organizations. These policies and algorithms were followed during the patient's care in the ED.  Some ED evaluations and interventions may be delayed as a result of limited staffing during the pandemic.*  Note:  This document was prepared using Dragon voice recognition software and may include unintentional dictation errors.   Gregor Hams, MD 08/13/19 [phone]

## 2019-08-13 NOTE — ED Notes (Signed)
Pt reports "hearing a pop" and then had an episode of emesis with two more episodes in route to the ED. Pt arrived to ED treatment room diaphoretic. No neurologic deficits but continues weakness and headache.   Pt reports he has not been able to buy his medication due to financial difficulties.

## 2019-08-13 NOTE — ED Notes (Signed)
Pt reports now with HA to forehead and top of head; denies hx of HA; st has been off BP meds "for months"; Dr Owens Shark notified and CT ordered

## 2019-08-19 ENCOUNTER — Emergency Department: Payer: Medicaid Other

## 2019-08-19 ENCOUNTER — Other Ambulatory Visit: Payer: Self-pay

## 2019-08-19 ENCOUNTER — Encounter: Payer: Self-pay | Admitting: Emergency Medicine

## 2019-08-19 LAB — URINALYSIS, COMPLETE (UACMP) WITH MICROSCOPIC
Bacteria, UA: NONE SEEN
Bilirubin Urine: NEGATIVE
Glucose, UA: >=500 mg/dL — AB
Hgb urine dipstick: NEGATIVE
Ketones, ur: 20 mg/dL — AB
Leukocytes,Ua: NEGATIVE
Nitrite: NEGATIVE
Protein, ur: NEGATIVE mg/dL
Specific Gravity, Urine: 1.032 — ABNORMAL HIGH (ref 1.005–1.030)
Squamous Epithelial / HPF: NONE SEEN (ref 0–5)
pH: 5 (ref 5.0–8.0)

## 2019-08-19 LAB — URINE DRUG SCREEN, QUALITATIVE (ARMC ONLY)
Amphetamines, Ur Screen: NOT DETECTED
Barbiturates, Ur Screen: NOT DETECTED
Benzodiazepine, Ur Scrn: NOT DETECTED
Cannabinoid 50 Ng, Ur ~~LOC~~: NOT DETECTED
Cocaine Metabolite,Ur ~~LOC~~: NOT DETECTED
MDMA (Ecstasy)Ur Screen: NOT DETECTED
Methadone Scn, Ur: NOT DETECTED
Opiate, Ur Screen: NOT DETECTED
Phencyclidine (PCP) Ur S: NOT DETECTED
Tricyclic, Ur Screen: NOT DETECTED

## 2019-08-19 LAB — CBC WITH DIFFERENTIAL/PLATELET
Abs Immature Granulocytes: 0.15 10*3/uL — ABNORMAL HIGH (ref 0.00–0.07)
Basophils Absolute: 0.1 10*3/uL (ref 0.0–0.1)
Basophils Relative: 1 %
Eosinophils Absolute: 0.1 10*3/uL (ref 0.0–0.5)
Eosinophils Relative: 1 %
HCT: 42.1 % (ref 39.0–52.0)
Hemoglobin: 15 g/dL (ref 13.0–17.0)
Immature Granulocytes: 1 %
Lymphocytes Relative: 17 %
Lymphs Abs: 2.2 10*3/uL (ref 0.7–4.0)
MCH: 31.1 pg (ref 26.0–34.0)
MCHC: 35.6 g/dL (ref 30.0–36.0)
MCV: 87.2 fL (ref 80.0–100.0)
Monocytes Absolute: 0.7 10*3/uL (ref 0.1–1.0)
Monocytes Relative: 6 %
Neutro Abs: 9.5 10*3/uL — ABNORMAL HIGH (ref 1.7–7.7)
Neutrophils Relative %: 74 %
Platelets: 276 10*3/uL (ref 150–400)
RBC: 4.83 MIL/uL (ref 4.22–5.81)
RDW: 14.4 % (ref 11.5–15.5)
WBC: 12.7 10*3/uL — ABNORMAL HIGH (ref 4.0–10.5)
nRBC: 0 % (ref 0.0–0.2)

## 2019-08-19 LAB — COMPREHENSIVE METABOLIC PANEL
ALT: 40 U/L (ref 0–44)
AST: 44 U/L — ABNORMAL HIGH (ref 15–41)
Albumin: 4.9 g/dL (ref 3.5–5.0)
Alkaline Phosphatase: 65 U/L (ref 38–126)
Anion gap: 12 (ref 5–15)
BUN: 13 mg/dL (ref 6–20)
CO2: 27 mmol/L (ref 22–32)
Calcium: 10.1 mg/dL (ref 8.9–10.3)
Chloride: 102 mmol/L (ref 98–111)
Creatinine, Ser: 0.82 mg/dL (ref 0.61–1.24)
GFR calc Af Amer: >60 mL/min (ref >60)
GFR calc non Af Amer: >60 mL/min (ref >60)
Glucose, Bld: 215 mg/dL — ABNORMAL HIGH (ref 70–99)
Potassium: 3.9 mmol/L (ref 3.5–5.1)
Sodium: 141 mmol/L (ref 135–145)
Total Bilirubin: 1.6 mg/dL — ABNORMAL HIGH (ref 0.3–1.2)
Total Protein: 7.8 g/dL (ref 6.5–8.1)

## 2019-08-19 LAB — GLUCOSE, CAPILLARY: Glucose-Capillary: 318 mg/dL — ABNORMAL HIGH (ref 70–99)

## 2019-08-19 LAB — ETHANOL: Alcohol, Ethyl (B): <10 mg/dL (ref <10)

## 2019-08-19 LAB — TROPONIN I (HIGH SENSITIVITY): Troponin I (High Sensitivity): 19 ng/L — ABNORMAL HIGH (ref <18)
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
## 2019-08-19 NOTE — ED Notes (Signed)
Full rainbow drawn in triage 
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
## 2019-08-20 ENCOUNTER — Emergency Department
Admission: EM | Admit: 2019-08-20 | Discharge: 2019-08-20 | Disposition: A | Discharge status: Home / Self Care | Payer: Medicaid Other | Source: Home / Self Care

## 2019-08-20 ENCOUNTER — Inpatient Hospital Stay
Admission: EM | Admit: 2019-08-20 | Discharge: 2019-08-23 | DRG: 065 | Disposition: A | Discharge status: Home / Self Care | Payer: Medicaid Other | Attending: Internal Medicine | Admitting: Internal Medicine

## 2019-08-20 ENCOUNTER — Other Ambulatory Visit: Payer: Self-pay

## 2019-08-20 ENCOUNTER — Encounter: Payer: Self-pay | Admitting: Radiology

## 2019-08-20 ENCOUNTER — Emergency Department: Payer: Medicaid Other

## 2019-08-20 DIAGNOSIS — Z7984 Long term (current) use of oral hypoglycemic drugs: Secondary | ICD-10-CM

## 2019-08-20 DIAGNOSIS — E785 Hyperlipidemia, unspecified: Secondary | ICD-10-CM | POA: Diagnosis present

## 2019-08-20 DIAGNOSIS — Z8249 Family history of ischemic heart disease and other diseases of the circulatory system: Secondary | ICD-10-CM

## 2019-08-20 DIAGNOSIS — Z7289 Other problems related to lifestyle: Secondary | ICD-10-CM

## 2019-08-20 DIAGNOSIS — T383X6A Underdosing of insulin and oral hypoglycemic [antidiabetic] drugs, initial encounter: Secondary | ICD-10-CM | POA: Diagnosis present

## 2019-08-20 DIAGNOSIS — S2241XA Multiple fractures of ribs, right side, initial encounter for closed fracture: Secondary | ICD-10-CM

## 2019-08-20 DIAGNOSIS — Z79899 Other long term (current) drug therapy: Secondary | ICD-10-CM

## 2019-08-20 DIAGNOSIS — I639 Cerebral infarction, unspecified: Principal | ICD-10-CM | POA: Diagnosis present

## 2019-08-20 DIAGNOSIS — Z66 Do not resuscitate: Secondary | ICD-10-CM | POA: Diagnosis present

## 2019-08-20 DIAGNOSIS — E1142 Type 2 diabetes mellitus with diabetic polyneuropathy: Secondary | ICD-10-CM | POA: Diagnosis present

## 2019-08-20 DIAGNOSIS — M419 Scoliosis, unspecified: Secondary | ICD-10-CM | POA: Diagnosis present

## 2019-08-20 DIAGNOSIS — E119 Type 2 diabetes mellitus without complications: Secondary | ICD-10-CM

## 2019-08-20 DIAGNOSIS — Z9119 Patient's noncompliance with other medical treatment and regimen: Secondary | ICD-10-CM

## 2019-08-20 DIAGNOSIS — Z6824 Body mass index (BMI) 24.0-24.9, adult: Secondary | ICD-10-CM

## 2019-08-20 DIAGNOSIS — E1165 Type 2 diabetes mellitus with hyperglycemia: Secondary | ICD-10-CM | POA: Diagnosis present

## 2019-08-20 DIAGNOSIS — I10 Essential (primary) hypertension: Secondary | ICD-10-CM | POA: Diagnosis present

## 2019-08-20 DIAGNOSIS — R29701 NIHSS score 1: Secondary | ICD-10-CM | POA: Diagnosis present

## 2019-08-20 DIAGNOSIS — Z800 Family history of malignant neoplasm of digestive organs: Secondary | ICD-10-CM

## 2019-08-20 DIAGNOSIS — Z87891 Personal history of nicotine dependence: Secondary | ICD-10-CM

## 2019-08-20 DIAGNOSIS — Z8673 Personal history of transient ischemic attack (TIA), and cerebral infarction without residual deficits: Secondary | ICD-10-CM

## 2019-08-20 DIAGNOSIS — I2510 Atherosclerotic heart disease of native coronary artery without angina pectoris: Secondary | ICD-10-CM | POA: Diagnosis present

## 2019-08-20 DIAGNOSIS — R1011 Right upper quadrant pain: Secondary | ICD-10-CM | POA: Diagnosis present

## 2019-08-20 DIAGNOSIS — I252 Old myocardial infarction: Secondary | ICD-10-CM

## 2019-08-20 DIAGNOSIS — S2249XA Multiple fractures of ribs, unspecified side, initial encounter for closed fracture: Secondary | ICD-10-CM

## 2019-08-20 DIAGNOSIS — R531 Weakness: Secondary | ICD-10-CM | POA: Diagnosis present

## 2019-08-20 DIAGNOSIS — Y92410 Unspecified street and highway as the place of occurrence of the external cause: Secondary | ICD-10-CM

## 2019-08-20 HISTORY — DX: Type 2 diabetes mellitus without complications: E11.9

## 2019-08-20 LAB — TROPONIN I (HIGH SENSITIVITY)
Troponin I (High Sensitivity): 15 ng/L (ref <18)
Troponin I (High Sensitivity): 13 ng/L (ref <18)
Troponin I (High Sensitivity): 12 ng/L (ref <18)

## 2019-08-20 LAB — CBC WITH DIFFERENTIAL/PLATELET
Abs Immature Granulocytes: 0.08 10*3/uL — ABNORMAL HIGH (ref 0.00–0.07)
Basophils Absolute: 0.1 10*3/uL (ref 0.0–0.1)
Basophils Relative: 1 %
Eosinophils Absolute: 0.2 10*3/uL (ref 0.0–0.5)
Eosinophils Relative: 2 %
HCT: 39.1 % (ref 39.0–52.0)
Hemoglobin: 14.1 g/dL (ref 13.0–17.0)
Immature Granulocytes: 1 %
Lymphocytes Relative: 26 %
Lymphs Abs: 2.6 10*3/uL (ref 0.7–4.0)
MCH: 31.1 pg (ref 26.0–34.0)
MCHC: 36.1 g/dL — ABNORMAL HIGH (ref 30.0–36.0)
MCV: 86.1 fL (ref 80.0–100.0)
Monocytes Absolute: 0.8 10*3/uL (ref 0.1–1.0)
Monocytes Relative: 8 %
Neutro Abs: 6.4 10*3/uL (ref 1.7–7.7)
Neutrophils Relative %: 62 %
Platelets: 286 10*3/uL (ref 150–400)
RBC: 4.54 MIL/uL (ref 4.22–5.81)
RDW: 14.2 % (ref 11.5–15.5)
WBC: 10.1 10*3/uL (ref 4.0–10.5)
nRBC: 0 % (ref 0.0–0.2)

## 2019-08-20 LAB — BASIC METABOLIC PANEL
Anion gap: 12 (ref 5–15)
BUN: 16 mg/dL (ref 6–20)
CO2: 22 mmol/L (ref 22–32)
Calcium: 9.7 mg/dL (ref 8.9–10.3)
Chloride: 98 mmol/L (ref 98–111)
Creatinine, Ser: 0.67 mg/dL (ref 0.61–1.24)
GFR calc Af Amer: >60 mL/min (ref >60)
GFR calc non Af Amer: >60 mL/min (ref >60)
Glucose, Bld: 253 mg/dL — ABNORMAL HIGH (ref 70–99)
Potassium: 3.7 mmol/L (ref 3.5–5.1)
Sodium: 132 mmol/L — ABNORMAL LOW (ref 135–145)

## 2019-08-20 MED ORDER — STROKE: EARLY STAGES OF RECOVERY BOOK: Freq: Once | Status: AC

## 2019-08-20 MED ORDER — ASPIRIN 81 MG PO CHEW
324.0000 mg | CHEWABLE_TABLET | Freq: Once | ORAL | Status: AC
  Administered 2019-08-20: 324 mg via ORAL
  Filled 2019-08-20: qty 4

## 2019-08-20 MED ORDER — ONDANSETRON HCL 4 MG/2ML IJ SOLN
4.0000 mg | Freq: Once | INTRAMUSCULAR | Status: AC
  Administered 2019-08-20: 4 mg via INTRAVENOUS
  Filled 2019-08-20: qty 2

## 2019-08-20 MED ORDER — MORPHINE SULFATE (PF) 4 MG/ML IV SOLN
4.0000 mg | INTRAVENOUS | Status: DC | PRN
  Administered 2019-08-20: 4 mg via INTRAVENOUS
  Filled 2019-08-20: qty 1

## 2019-08-20 MED ORDER — OXYCODONE HCL 5 MG PO TABS
5.0000 mg | ORAL_TABLET | ORAL | Status: DC | PRN
  Administered 2019-08-21 – 2019-08-22 (×6): 5 mg via ORAL
  Filled 2019-08-20 (×6): qty 1

## 2019-08-20 MED ORDER — INSULIN ASPART 100 UNIT/ML ~~LOC~~ SOLN
0.0000 [IU] | Freq: Three times a day (TID) | SUBCUTANEOUS | Status: DC
  Administered 2019-08-21: 1 [IU] via SUBCUTANEOUS
  Administered 2019-08-21 – 2019-08-22 (×2): 2 [IU] via SUBCUTANEOUS
  Administered 2019-08-22 – 2019-08-23 (×2): 5 [IU] via SUBCUTANEOUS
  Administered 2019-08-23: 1 [IU] via SUBCUTANEOUS
  Filled 2019-08-20 (×6): qty 1

## 2019-08-20 MED ORDER — ACETAMINOPHEN 650 MG RE SUPP: 650.0000 mg | RECTAL | Status: DC | PRN

## 2019-08-20 MED ORDER — ENOXAPARIN SODIUM 40 MG/0.4ML ~~LOC~~ SOLN
40.0000 mg | SUBCUTANEOUS | Status: DC
  Administered 2019-08-21 – 2019-08-23 (×3): 40 mg via SUBCUTANEOUS
  Filled 2019-08-20 (×3): qty 0.4

## 2019-08-20 MED ORDER — ATORVASTATIN CALCIUM 20 MG PO TABS
80.0000 mg | ORAL_TABLET | Freq: Every day | ORAL | Status: DC
  Administered 2019-08-21 – 2019-08-22 (×2): 80 mg via ORAL
  Filled 2019-08-20 (×2): qty 4

## 2019-08-20 MED ORDER — SODIUM CHLORIDE 0.9 % IV SOLN: INTRAVENOUS | Status: DC

## 2019-08-20 MED ORDER — ACETAMINOPHEN 325 MG PO TABS
650.0000 mg | ORAL_TABLET | ORAL | Status: DC | PRN
  Administered 2019-08-21 – 2019-08-22 (×2): 650 mg via ORAL
  Filled 2019-08-20 (×2): qty 2

## 2019-08-20 MED ORDER — IOHEXOL 300 MG/ML  SOLN
100.0000 mL | Freq: Once | INTRAMUSCULAR | Status: AC | PRN
  Administered 2019-08-20: 100 mL via INTRAVENOUS

## 2019-08-20 MED ORDER — SENNOSIDES-DOCUSATE SODIUM 8.6-50 MG PO TABS
1.0000 | ORAL_TABLET | Freq: Every evening | ORAL | Status: DC | PRN
  Administered 2019-08-21: 1 via ORAL
  Filled 2019-08-20: qty 1

## 2019-08-20 MED ORDER — ASPIRIN 300 MG RE SUPP
300.0000 mg | Freq: Every day | RECTAL | Status: DC
  Filled 2019-08-20 (×2): qty 1

## 2019-08-20 MED ORDER — ASPIRIN EC 325 MG PO TBEC
325.0000 mg | DELAYED_RELEASE_TABLET | Freq: Every day | ORAL | Status: DC
  Administered 2019-08-21 – 2019-08-23 (×3): 325 mg via ORAL
  Filled 2019-08-20 (×3): qty 1

## 2019-08-20 MED ORDER — SODIUM CHLORIDE 0.9 % IV BOLUS
500.0000 mL | Freq: Once | INTRAVENOUS | Status: AC
  Administered 2019-08-20: 500 mL via INTRAVENOUS

## 2019-08-20 MED ORDER — ACETAMINOPHEN 160 MG/5ML PO SOLN
650.0000 mg | ORAL | Status: DC | PRN
  Filled 2019-08-20: qty 20.3

## 2019-08-20 MED ORDER — HYDROCODONE-ACETAMINOPHEN 5-325 MG PO TABS
1.0000 | ORAL_TABLET | Freq: Once | ORAL | Status: AC
  Administered 2019-08-20: 1 via ORAL
  Filled 2019-08-20: qty 1

## 2019-08-20 NOTE — ED Notes (Signed)
Pt taken to CT.

## 2019-08-20 NOTE — ED Notes (Signed)
Pt back from MRI 
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
## 2019-08-20 NOTE — ED Notes (Signed)
Pt called from [facility] to treatment room, no response 

## 2019-08-20 NOTE — ED Notes (Signed)
Pt given sandwich tray 

## 2019-08-20 NOTE — ED Notes (Signed)
Pt ambulatory to bathroom with standy assist for safety.
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
## 2019-08-20 NOTE — ED Notes (Signed)
Pts son, Aithen Bein [phone] or daughter in law Daly Eoff [phone]   Can be contacted when he is discharged or in case of emergency

## 2019-08-20 NOTE — ED Notes (Signed)
No answer when called several times from lobby 

## 2019-08-20 NOTE — ED Notes (Signed)
Pt taken to MRI  

## 2019-08-20 NOTE — ED Triage Notes (Signed)
Pt called from [facility] to treatment room, no response 

## 2019-08-20 NOTE — ED Notes (Signed)
Pt updated on wait. VS reassessed.
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
## 2019-08-21 ENCOUNTER — Observation Stay: Payer: Medicaid Other

## 2019-08-21 DIAGNOSIS — I10 Essential (primary) hypertension: Secondary | ICD-10-CM

## 2019-08-21 DIAGNOSIS — I2510 Atherosclerotic heart disease of native coronary artery without angina pectoris: Secondary | ICD-10-CM

## 2019-08-21 DIAGNOSIS — E1165 Type 2 diabetes mellitus with hyperglycemia: Secondary | ICD-10-CM

## 2019-08-21 DIAGNOSIS — E785 Hyperlipidemia, unspecified: Secondary | ICD-10-CM

## 2019-08-21 DIAGNOSIS — S2241XA Multiple fractures of ribs, right side, initial encounter for closed fracture: Secondary | ICD-10-CM

## 2019-08-21 DIAGNOSIS — Z87891 Personal history of nicotine dependence: Secondary | ICD-10-CM

## 2019-08-21 LAB — LIPID PANEL
Cholesterol: 158 mg/dL (ref 0–200)
HDL: 31 mg/dL — ABNORMAL LOW (ref >40)
LDL Cholesterol: 85 mg/dL (ref 0–99)
Total CHOL/HDL Ratio: 5.1 RATIO
Triglycerides: 208 mg/dL — ABNORMAL HIGH (ref <150)
VLDL: 42 mg/dL — ABNORMAL HIGH (ref 0–40)

## 2019-08-21 LAB — GLUCOSE, CAPILLARY
Glucose-Capillary: 156 mg/dL — ABNORMAL HIGH (ref 70–99)
Glucose-Capillary: 143 mg/dL — ABNORMAL HIGH (ref 70–99)
Glucose-Capillary: 108 mg/dL — ABNORMAL HIGH (ref 70–99)
Glucose-Capillary: 174 mg/dL — ABNORMAL HIGH (ref 70–99)

## 2019-08-21 LAB — HEMOGLOBIN A1C
Hgb A1c MFr Bld: 9.9 % — ABNORMAL HIGH (ref 4.8–5.6)
Mean Plasma Glucose: 237.43 mg/dL

## 2019-08-21 NOTE — ED Notes (Signed)
Po fluids provided.  
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
## 2019-08-21 NOTE — ED Notes (Signed)
Pt requesting pain medication.  

## 2019-08-21 NOTE — ED Notes (Signed)
Pt sitting up in bed, watching tv. Denies needs at this time.
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
## 2019-08-21 NOTE — Progress Notes (Signed)
SLP Cancellation Note  Patient Details Name: Christian [name] MRN: [id number] DOB: 03-13-1971   Cancelled treatment:       Reason Eval/Treat Not Completed: SLP screened, no needs identified, will sign off   Pt is improved over previous cognitive assessment on 02/2019. As such, no acute deficits were identified.   Wise Fees B. Rutherford Nail M.S., CCC-SLP, [place] Office [phone]    Harriette Tovey Rutherford Nail 08/21/2019, 1:25 PM

## 2019-08-21 NOTE — ED Notes (Signed)
Pt up to restroom.
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
## 2019-08-21 NOTE — ED Notes (Signed)
Pt denies needs at this time.  
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
## 2019-08-21 NOTE — Social Work (Signed)
Social work consult received.   In progress. Attempted to reach pt.  Cell phone not working. No answer.   Berenice Bouton, MSW, LCSW  [phone] 8am-6pm (weekends) or CSW ED # [phone]

## 2019-08-21 NOTE — ED Notes (Signed)
Physical Therapy at patient bedside.

## 2019-08-21 NOTE — ED Notes (Signed)
Pt denies needs. Pt watching tv.
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
## 2019-08-21 NOTE — ED Notes (Signed)
Vascular Ultrasound at patient bedside. 

## 2019-08-21 NOTE — ED Notes (Signed)
Pt moved to room 7 to hospital bed, placed on cardiac monitor. Call bell at left side. Urinal provided. Pt states pain improved post oxycodone administration.

## 2019-08-22 ENCOUNTER — Observation Stay (HOSPITAL_BASED_OUTPATIENT_CLINIC_OR_DEPARTMENT_OTHER)
Admit: 2019-08-22 | Discharge: 2019-08-22 | Disposition: A | Discharge status: Home / Self Care | Payer: Medicaid Other | Attending: Internal Medicine | Admitting: Internal Medicine

## 2019-08-22 DIAGNOSIS — E1142 Type 2 diabetes mellitus with diabetic polyneuropathy: Secondary | ICD-10-CM

## 2019-08-22 DIAGNOSIS — I6389 Other cerebral infarction: Secondary | ICD-10-CM

## 2019-08-22 LAB — GLUCOSE, CAPILLARY
Glucose-Capillary: 118 mg/dL — ABNORMAL HIGH (ref 70–99)
Glucose-Capillary: 156 mg/dL — ABNORMAL HIGH (ref 70–99)
Glucose-Capillary: 258 mg/dL — ABNORMAL HIGH (ref 70–99)
Glucose-Capillary: 142 mg/dL — ABNORMAL HIGH (ref 70–99)

## 2019-08-22 LAB — ECHOCARDIOGRAM COMPLETE
Height: 66 in
Weight: 2432.01 oz

## 2019-08-22 MED ORDER — LOSARTAN POTASSIUM 100 MG PO TABS: 100.0000 mg | ORAL_TABLET | Freq: Every day | ORAL | 0 refills | Status: DC

## 2019-08-22 MED ORDER — OXYCODONE HCL 5 MG PO TABS: 5.0000 mg | ORAL_TABLET | ORAL | 0 refills | Status: DC | PRN

## 2019-08-22 MED ORDER — CLOPIDOGREL BISULFATE 75 MG PO TABS
75.0000 mg | ORAL_TABLET | Freq: Every day | ORAL | 0 refills | Status: DC
Start: 2019-08-22 — End: 2019-09-28

## 2019-08-22 MED ORDER — LOSARTAN POTASSIUM 50 MG PO TABS
100.0000 mg | ORAL_TABLET | Freq: Every day | ORAL | Status: DC
  Administered 2019-08-22 – 2019-08-23 (×2): 100 mg via ORAL
  Filled 2019-08-22 (×2): qty 2

## 2019-08-22 MED ORDER — ASPIRIN 325 MG PO TBEC: 325.0000 mg | DELAYED_RELEASE_TABLET | Freq: Every day | ORAL | 0 refills | Status: DC
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
## 2019-08-23 DIAGNOSIS — Z6824 Body mass index (BMI) 24.0-24.9, adult: Secondary | ICD-10-CM | POA: Diagnosis not present

## 2019-08-23 DIAGNOSIS — R079 Chest pain, unspecified: Secondary | ICD-10-CM | POA: Diagnosis not present

## 2019-08-23 DIAGNOSIS — S2241XA Multiple fractures of ribs, right side, initial encounter for closed fracture: Secondary | ICD-10-CM | POA: Diagnosis present

## 2019-08-23 DIAGNOSIS — Z8249 Family history of ischemic heart disease and other diseases of the circulatory system: Secondary | ICD-10-CM | POA: Diagnosis not present

## 2019-08-23 DIAGNOSIS — E1165 Type 2 diabetes mellitus with hyperglycemia: Secondary | ICD-10-CM | POA: Diagnosis present

## 2019-08-23 DIAGNOSIS — Z7984 Long term (current) use of oral hypoglycemic drugs: Secondary | ICD-10-CM | POA: Diagnosis not present

## 2019-08-23 DIAGNOSIS — I639 Cerebral infarction, unspecified: Secondary | ICD-10-CM | POA: Diagnosis present

## 2019-08-23 DIAGNOSIS — Z79899 Other long term (current) drug therapy: Secondary | ICD-10-CM | POA: Diagnosis not present

## 2019-08-23 DIAGNOSIS — M419 Scoliosis, unspecified: Secondary | ICD-10-CM | POA: Diagnosis present

## 2019-08-23 DIAGNOSIS — R1011 Right upper quadrant pain: Secondary | ICD-10-CM | POA: Diagnosis present

## 2019-08-23 DIAGNOSIS — E785 Hyperlipidemia, unspecified: Secondary | ICD-10-CM | POA: Diagnosis present

## 2019-08-23 DIAGNOSIS — Z8673 Personal history of transient ischemic attack (TIA), and cerebral infarction without residual deficits: Secondary | ICD-10-CM | POA: Diagnosis not present

## 2019-08-23 DIAGNOSIS — Z800 Family history of malignant neoplasm of digestive organs: Secondary | ICD-10-CM | POA: Diagnosis not present

## 2019-08-23 DIAGNOSIS — Z7289 Other problems related to lifestyle: Secondary | ICD-10-CM | POA: Diagnosis not present

## 2019-08-23 DIAGNOSIS — Z9119 Patient's noncompliance with other medical treatment and regimen: Secondary | ICD-10-CM | POA: Diagnosis not present

## 2019-08-23 DIAGNOSIS — Z66 Do not resuscitate: Secondary | ICD-10-CM | POA: Diagnosis present

## 2019-08-23 DIAGNOSIS — Z87891 Personal history of nicotine dependence: Secondary | ICD-10-CM | POA: Diagnosis not present

## 2019-08-23 DIAGNOSIS — I252 Old myocardial infarction: Secondary | ICD-10-CM | POA: Diagnosis not present

## 2019-08-23 DIAGNOSIS — Y92410 Unspecified street and highway as the place of occurrence of the external cause: Secondary | ICD-10-CM | POA: Diagnosis not present

## 2019-08-23 DIAGNOSIS — R531 Weakness: Secondary | ICD-10-CM | POA: Diagnosis present

## 2019-08-23 DIAGNOSIS — R29701 NIHSS score 1: Secondary | ICD-10-CM | POA: Diagnosis present

## 2019-08-23 DIAGNOSIS — I10 Essential (primary) hypertension: Secondary | ICD-10-CM | POA: Diagnosis present

## 2019-08-23 DIAGNOSIS — I2510 Atherosclerotic heart disease of native coronary artery without angina pectoris: Secondary | ICD-10-CM | POA: Diagnosis present

## 2019-08-23 DIAGNOSIS — E1142 Type 2 diabetes mellitus with diabetic polyneuropathy: Secondary | ICD-10-CM | POA: Diagnosis present

## 2019-08-23 DIAGNOSIS — T383X6A Underdosing of insulin and oral hypoglycemic [antidiabetic] drugs, initial encounter: Secondary | ICD-10-CM | POA: Diagnosis present

## 2019-08-23 LAB — BASIC METABOLIC PANEL
Anion gap: 11 (ref 5–15)
BUN: 7 mg/dL (ref 6–20)
CO2: 23 mmol/L (ref 22–32)
Calcium: 9 mg/dL (ref 8.9–10.3)
Chloride: 104 mmol/L (ref 98–111)
Creatinine, Ser: 0.57 mg/dL — ABNORMAL LOW (ref 0.61–1.24)
GFR calc Af Amer: >60 mL/min (ref >60)
GFR calc non Af Amer: >60 mL/min (ref >60)
Glucose, Bld: 181 mg/dL — ABNORMAL HIGH (ref 70–99)
Potassium: 3.8 mmol/L (ref 3.5–5.1)
Sodium: 138 mmol/L (ref 135–145)

## 2019-08-23 LAB — CBC
HCT: 32.9 % — ABNORMAL LOW (ref 39.0–52.0)
Hemoglobin: 12.1 g/dL — ABNORMAL LOW (ref 13.0–17.0)
MCH: 31.6 pg (ref 26.0–34.0)
MCHC: 36.8 g/dL — ABNORMAL HIGH (ref 30.0–36.0)
MCV: 85.9 fL (ref 80.0–100.0)
Platelets: 260 10*3/uL (ref 150–400)
RBC: 3.83 MIL/uL — ABNORMAL LOW (ref 4.22–5.81)
RDW: 13.6 % (ref 11.5–15.5)
WBC: 6.3 10*3/uL (ref 4.0–10.5)
nRBC: 0 % (ref 0.0–0.2)

## 2019-08-23 LAB — GLUCOSE, CAPILLARY
Glucose-Capillary: 147 mg/dL — ABNORMAL HIGH (ref 70–99)
Glucose-Capillary: 255 mg/dL — ABNORMAL HIGH (ref 70–99)

## 2019-08-23 MED ORDER — ASPIRIN 81 MG PO CHEW: 81.0000 mg | CHEWABLE_TABLET | Freq: Every day | ORAL | 0 refills | Status: AC

## 2019-08-23 MED ORDER — HYDRALAZINE HCL 10 MG PO TABS
10.0000 mg | ORAL_TABLET | Freq: Three times a day (TID) | ORAL | Status: DC | PRN
  Administered 2019-08-23: 10 mg via ORAL
  Filled 2019-08-23 (×3): qty 1

## 2019-08-23 NOTE — Plan of Care (Signed)
  Problem: Education: Goal: Knowledge of secondary prevention will improve Outcome: Progressing Goal: Knowledge of patient specific risk factors addressed and post discharge goals established will improve Outcome: Progressing   Problem: Coping: Goal: Will verbalize positive feelings about self Outcome: Progressing Goal: Will identify appropriate support needs Outcome: Progressing   Problem: Health Behavior/Discharge Planning: Goal: Ability to manage health-related needs will improve Outcome: Progressing   Problem: Self-Care: Goal: Ability to participate in self-care as condition permits will improve Outcome: Progressing Goal: Ability to communicate needs accurately will improve Outcome: Progressing   Problem: Nutrition: Goal: Risk of aspiration will decrease Outcome: Progressing Goal: Dietary intake will improve Outcome: Progressing   Problem: Ischemic Stroke/TIA Tissue Perfusion: Goal: Complications of ischemic stroke/TIA will be minimized Outcome: Progressing
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
## 2019-08-23 NOTE — TOC Progression Note (Signed)
Transition of Care [facility]) - Progression Note    Patient Details  Name: Christian [name] MRN: [id number] Date of Birth: 10-18-1970  Transition of Care [facility]) CM/SW [place], RN Phone Number: 08/23/2019, 10:56 AM  Clinical Narrative:    Met with the patient to discuss DC plan and needs, he lives home alone  He does have transportation He does not have a PCP I provided him with [facility] and emailed the referral to [employer] He will be utilizing [facility] to get medications I explained to him where it is and that they are closed today, he stated that he already had the application for [facility] He stated that he does not need DME I called Santiago Glad with [facility] and set' \\him'$  up with [facility] PT and OT,  The patient stated that he has no other needs    Expected Discharge Plan: [place] Barriers to Discharge: Continued Medical Work up  Expected Discharge Plan and Services Expected Discharge Plan: [place] In-house Referral: Clinical Social Work   Post Acute Care Choice: [place] arrangements for the past 2 months: Single Family Home Expected Discharge Date: 08/22/19                         [facility] Arranged: OT           Social Determinants of Health (SDOH) Interventions    Readmission Risk Interventions No flowsheet data found.

## 2019-08-25 ENCOUNTER — Telehealth: Payer: Self-pay | Admitting: General Practice

## 2019-08-25 NOTE — Telephone Encounter (Signed)
Received a referral from the hospital; reached out to patient to answer any questions. Was able to clarify a few things for him

## 2019-09-01 ENCOUNTER — Telehealth: Payer: Self-pay | Admitting: General Practice

## 2019-09-02 ENCOUNTER — Telehealth: Payer: Self-pay | Admitting: General Practice

## 2019-09-02 NOTE — Telephone Encounter (Signed)
Provided phone number of clinic if there were any further questions.

## 2019-09-17 ENCOUNTER — Ambulatory Visit: Payer: Medicaid Other | Admitting: Pharmacy Technician

## 2019-09-17 ENCOUNTER — Other Ambulatory Visit: Payer: Self-pay

## 2019-09-17 DIAGNOSIS — Z79899 Other long term (current) drug therapy: Secondary | ICD-10-CM

## 2019-09-17 NOTE — Progress Notes (Signed)
Met with patient completed financial assistance application for [facility] due to recent hospital visit.  Information forwarded to appropriate department in [facility].    Completed [facility] application and contract.  Patient agreed to all terms of the [facility] contract.    Patient approved to receive medication assistance at [facility] until time for re-certification in 3014 and as long as eligibility criteria continues to be met.    Provided patient with Civil engineer, contracting based on his particular needs.    [place] Prescription Application completed with patient.  Forwarded to [facility] for signature.  Upon receipt of signed application from provider,  [place] Prescription Application will be submitted to [facility].  [facility]

## 2019-09-28 ENCOUNTER — Encounter: Payer: Self-pay | Admitting: Gerontology

## 2019-09-28 ENCOUNTER — Other Ambulatory Visit: Payer: Self-pay

## 2019-09-28 ENCOUNTER — Ambulatory Visit: Payer: Medicaid Other | Admitting: Gerontology

## 2019-09-28 VITALS — BP 92/63 | HR 76 | Ht 66.0 in | Wt 162.7 lb

## 2019-09-28 DIAGNOSIS — Z7689 Persons encountering health services in other specified circumstances: Secondary | ICD-10-CM

## 2019-09-28 DIAGNOSIS — E785 Hyperlipidemia, unspecified: Secondary | ICD-10-CM

## 2019-09-28 DIAGNOSIS — G4700 Insomnia, unspecified: Secondary | ICD-10-CM

## 2019-09-28 DIAGNOSIS — I639 Cerebral infarction, unspecified: Secondary | ICD-10-CM

## 2019-09-28 DIAGNOSIS — E1142 Type 2 diabetes mellitus with diabetic polyneuropathy: Secondary | ICD-10-CM

## 2019-09-28 DIAGNOSIS — I10 Essential (primary) hypertension: Secondary | ICD-10-CM

## 2019-09-28 MED ORDER — ATORVASTATIN CALCIUM 80 MG PO TABS: 80.0000 mg | ORAL_TABLET | Freq: Every day | ORAL | 1 refills | Status: DC

## 2019-09-28 MED ORDER — TRAZODONE HCL 50 MG PO TABS: 50.0000 mg | ORAL_TABLET | Freq: Every evening | ORAL | 1 refills | Status: DC | PRN

## 2019-09-28 MED ORDER — CANAGLIFLOZIN 100 MG PO TABS: 100.0000 mg | ORAL_TABLET | Freq: Every day | ORAL | 1 refills | Status: DC

## 2019-09-28 MED ORDER — CARVEDILOL 25 MG PO TABS: 25.0000 mg | ORAL_TABLET | Freq: Two times a day (BID) | ORAL | 1 refills | Status: DC

## 2019-09-28 MED ORDER — METFORMIN HCL 1000 MG PO TABS: 1000.0000 mg | ORAL_TABLET | Freq: Two times a day (BID) | ORAL | 1 refills | Status: DC

## 2019-09-28 MED ORDER — LOSARTAN POTASSIUM 50 MG PO TABS: 50.0000 mg | ORAL_TABLET | Freq: Every day | ORAL | 0 refills | Status: DC

## 2019-09-28 MED ORDER — CLOPIDOGREL BISULFATE 75 MG PO TABS: 75.0000 mg | ORAL_TABLET | Freq: Every day | ORAL | 0 refills | Status: DC

## 2019-09-28 MED ORDER — BLOOD GLUCOSE MONITOR KIT: PACK | 0 refills | Status: DC

## 2019-09-28 NOTE — Patient Instructions (Signed)
Eating Plan After Stroke A stroke causes damage to the brain cells, which can affect your ability to walk, talk, and even eat. The impact of a stroke is different for everyone, and so is recovery. A good nutrition plan is important for your recovery. It can also lower your risk of another stroke. If you have difficulty chewing and swallowing your food, a dietitian or your stroke care team can help so that you can enjoy eating healthy foods. What are tips for following this plan?  Reading food labels  Choose foods that have less than 300 milligrams (mg) of sodium per serving. Limit your sodium intake to less than 1,500 mg per day.  Avoid foods that have saturated fat and trans fat.  Choose foods that are low in cholesterol. Limit the amount of cholesterol you eat each day to less than 200 mg.  Choose foods that are high in fiber. Eat 20-30 grams (g) of fiber each day.  Avoid foods with added sugar. Check the food label for ingredients such as sugar, corn syrup, honey, fructose, molasses, and cane juice. Shopping  At the grocery store, buy most of your food from areas near the walls of the store. This includes: ? Fresh fruits and vegetables. ? Dry grains, beans, nuts, and seeds. ? Fresh seafood, poultry, lean meats, and eggs. ? Low-fat dairy products.  Buy whole ingredients instead of prepackaged foods.  Buy fresh, in-season fruits and vegetables from local farmers markets.  Buy frozen fruits and vegetables in resealable bags. Cooking  Prepare foods with very little salt. Use herbs or salt-free spices instead.  Cook with heart-healthy oils, such as olive, avocado, canola, soybean, or sunflower oil.  Avoid frying foods. Bake, grill, or broil foods instead.  Remove visible fat and skin from meat and poultry before eating.  Modify food textures as told by your health care provider. Meal planning  Eat a wide variety of colorful fruits and vegetables. Make sure one-half of your  plate is filled with fruits and vegetables at each meal.  Eat fruits and vegetables that are high in potassium, such as: ? Apples, bananas, oranges, and melon. ? Sweet potatoes, spinach, zucchini, and tomatoes.  Eat fish that contain heart-healthy fats (omega-3 fats) at least twice a week. These include salmon, tuna, mackerel, and sardines.  Eat plant foods that are high in omega-3 fats, such as flaxseeds and walnuts. Add these to cereals, yogurt, or pasta dishes.  Eat several servings of high-fiber foods each day, such as fruits, vegetables, whole grains, and beans.  Do not put salt at the table for meals.  When eating out at restaurants: ? Ask the server about low-salt or salt-free food options. ? Avoid fried foods. Look for menu items that are grilled, steamed, broiled, or roasted. ? Ask if your food can be prepared without butter. ? Ask for condiments, such as salad dressings, gravy, or sauces to be served on the side.  If you have difficulty swallowing: ? Choose foods that are softer and easier to chew and swallow. ? Cut foods into small pieces and chew well before swallowing. ? Thicken liquids as told by your health care provider or dietitian. ? Let your health care provider know if your condition does not improve over time. You may need to work with a speech therapist to re-train the muscles that are used for eating. General recommendations  Involve your family and friends in your recovery, if possible. It may be helpful to have a slower meal time  and to plan meals that include foods everyone in the family can eat.  Brush your teeth with fluoride toothpaste twice a day, and floss once a day. Keeping a clean mouth can help you swallow and can also help your appetite.  Drink enough water each day to keep your urine pale yellow. If needed, set reminders or ask your family to help you remember to drink water.  Limit alcohol intake to no more than 1 drink a day for nonpregnant  women and 2 drinks a day for men. One drink equals 12 oz of beer, 5 oz of wine, or 1 oz of hard liquor. Summary  Following this eating plan can help in your stroke recovery and can decrease your risk for another stroke.  Let your health care provider know if you have problems with swallowing. You may need to work with a speech therapist. This information is not intended to replace advice given to you by your health care provider. Make sure you discuss any questions you have with your health care provider. Document Revised: 07/02/2018 Document Reviewed: 05/19/2017 Elsevier Patient Education  [place] DASH stands for "Dietary Approaches to Stop Hypertension." The DASH eating plan is a healthy eating plan that has been shown to reduce high blood pressure (hypertension). It may also reduce your risk for type 2 diabetes, heart disease, and stroke. The DASH eating plan may also help with weight loss. What are tips for following this plan?  General guidelines  Avoid eating more than 2,300 mg (milligrams) of salt (sodium) a day. If you have hypertension, you may need to reduce your sodium intake to 1,500 mg a day.  Limit alcohol intake to no more than 1 drink a day for nonpregnant women and 2 drinks a day for men. One drink equals 12 oz of beer, 5 oz of wine, or 1 oz of hard liquor.  Work with your health care provider to maintain a healthy body weight or to lose weight. Ask what an ideal weight is for you.  Get at least 30 minutes of exercise that causes your heart to beat faster (aerobic exercise) most days of the week. Activities may include walking, swimming, or biking.  Work with your health care provider or diet and nutrition specialist (dietitian) to adjust your eating plan to your individual calorie needs. Reading food labels   Check food labels for the amount of sodium per serving. Choose foods with less than 5 percent of the Daily Value of sodium. Generally,  foods with less than 300 mg of sodium per serving fit into this eating plan.  To find whole grains, look for the word "whole" as the first word in the ingredient list. Shopping  Buy products labeled as "low-sodium" or "no salt added."  Buy fresh foods. Avoid canned foods and premade or frozen meals. Cooking  Avoid adding salt when cooking. Use salt-free seasonings or herbs instead of table salt or sea salt. Check with your health care provider or pharmacist before using salt substitutes.  Do not fry foods. Cook foods using healthy methods such as baking, boiling, grilling, and broiling instead.  Cook with heart-healthy oils, such as olive, canola, soybean, or sunflower oil. Meal planning  Eat a balanced diet that includes: ? 5 or more servings of fruits and vegetables each day. At each meal, try to fill half of your plate with fruits and vegetables. ? Up to 6-8 servings of whole grains each day. ? Less than 6 oz  of lean meat, poultry, or fish each day. A 3-oz serving of meat is about the same size as a deck of cards. One egg equals 1 oz. ? 2 servings of low-fat dairy each day. ? A serving of nuts, seeds, or beans 5 times each week. ? Heart-healthy fats. Healthy fats called Omega-3 fatty acids are found in foods such as flaxseeds and coldwater fish, like sardines, salmon, and mackerel.  Limit how much you eat of the following: ? Canned or prepackaged foods. ? Food that is high in trans fat, such as fried foods. ? Food that is high in saturated fat, such as fatty meat. ? Sweets, desserts, sugary drinks, and other foods with added sugar. ? Full-fat dairy products.  Do not salt foods before eating.  Try to eat at least 2 vegetarian meals each week.  Eat more home-cooked food and less restaurant, buffet, and fast food.  When eating at a restaurant, ask that your food be prepared with less salt or no salt, if possible. What foods are recommended? The items listed may not be a  complete list. Talk with your dietitian about what dietary choices are best for you. Grains Whole-grain or whole-wheat bread. Whole-grain or whole-wheat pasta. Brown rice. [place]. Bulgur. Whole-grain and low-sodium cereals. Pita bread. Low-fat, low-sodium crackers. Whole-wheat flour tortillas. Vegetables Fresh or frozen vegetables (raw, steamed, roasted, or grilled). Low-sodium or reduced-sodium tomato and vegetable juice. Low-sodium or reduced-sodium tomato sauce and tomato paste. Low-sodium or reduced-sodium canned vegetables. Fruits All fresh, dried, or frozen fruit. Canned fruit in natural juice (without added sugar). Meat and other protein foods Skinless chicken or [place]. Ground chicken or [place]. Pork with fat trimmed off. Fish and seafood. Egg whites. Dried beans, peas, or lentils. Unsalted nuts, nut butters, and seeds. Unsalted canned beans. Lean cuts of beef with fat trimmed off. Low-sodium, lean deli meat. Dairy Low-fat (1%) or fat-free (skim) milk. Fat-free, low-fat, or reduced-fat cheeses. Nonfat, low-sodium ricotta or cottage cheese. Low-fat or nonfat yogurt. Low-fat, low-sodium cheese. Fats and oils Soft margarine without trans fats. Vegetable oil. Low-fat, reduced-fat, or light mayonnaise and salad dressings (reduced-sodium). Canola, safflower, olive, soybean, and sunflower oils. Avocado. Seasoning and other foods Herbs. Spices. Seasoning mixes without salt. Unsalted popcorn and pretzels. Fat-free sweets. What foods are not recommended? The items listed may not be a complete list. Talk with your dietitian about what dietary choices are best for you. Grains Baked goods made with fat, such as croissants, muffins, or some breads. Dry pasta or rice meal packs. Vegetables Creamed or fried vegetables. Vegetables in a cheese sauce. Regular canned vegetables (not low-sodium or reduced-sodium). Regular canned tomato sauce and paste (not low-sodium or reduced-sodium). Regular  tomato and vegetable juice (not low-sodium or reduced-sodium). Angie Fava. Olives. Fruits Canned fruit in a light or heavy syrup. Fried fruit. Fruit in cream or butter sauce. Meat and other protein foods Fatty cuts of meat. Ribs. Fried meat. Berniece Salines. Sausage. Bologna and other processed lunch meats. Salami. Fatback. Hotdogs. Bratwurst. Salted nuts and seeds. Canned beans with added salt. Canned or smoked fish. Whole eggs or egg yolks. Chicken or [place] with skin. Dairy Whole or 2% milk, cream, and half-and-half. Whole or full-fat cream cheese. Whole-fat or sweetened yogurt. Full-fat cheese. Nondairy creamers. Whipped toppings. Processed cheese and cheese spreads. Fats and oils Butter. Stick margarine. Lard. Shortening. Ghee. Bacon fat. Tropical oils, such as coconut, palm kernel, or palm oil. Seasoning and other foods Salted popcorn and pretzels. Onion salt, garlic salt, seasoned salt,  table salt, and sea salt. Worcestershire sauce. Tartar sauce. Barbecue sauce. Teriyaki sauce. Soy sauce, including reduced-sodium. Steak sauce. Canned and packaged gravies. Fish sauce. Oyster sauce. Cocktail sauce. Horseradish that you find on the shelf. Ketchup. Mustard. Meat flavorings and tenderizers. Bouillon cubes. Hot sauce and Tabasco sauce. Premade or packaged marinades. Premade or packaged taco seasonings. Relishes. Regular salad dressings. Where to find more information:  National Heart, Lung, and [place]: https://wilson-eaton.com/  American Heart Association: www.heart.org Summary  The DASH eating plan is a healthy eating plan that has been shown to reduce high blood pressure (hypertension). It may also reduce your risk for type 2 diabetes, heart disease, and stroke.  With the DASH eating plan, you should limit salt (sodium) intake to 2,300 mg a day. If you have hypertension, you may need to reduce your sodium intake to 1,500 mg a day.  When on the DASH eating plan, aim to eat more fresh fruits and  vegetables, whole grains, lean proteins, low-fat dairy, and heart-healthy fats.  Work with your health care provider or diet and nutrition specialist (dietitian) to adjust your eating plan to your individual calorie needs. This information is not intended to replace advice given to you by your health care provider. Make sure you discuss any questions you have with your health care provider. Document Revised: 02/21/2017 Document Reviewed: 03/04/2016 Elsevier Patient Education  2020 [employer].
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
## 2019-10-05 ENCOUNTER — Institutional Professional Consult (permissible substitution): Payer: Medicaid Other | Admitting: Licensed Clinical Social Worker

## 2019-10-14 ENCOUNTER — Ambulatory Visit: Payer: Medicaid Other | Admitting: Licensed Clinical Social Worker

## 2019-10-14 ENCOUNTER — Other Ambulatory Visit: Payer: Self-pay

## 2019-10-14 DIAGNOSIS — F1011 Alcohol abuse, in remission: Secondary | ICD-10-CM

## 2019-10-14 DIAGNOSIS — F341 Dysthymic disorder: Secondary | ICD-10-CM
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
## 2019-10-20 ENCOUNTER — Other Ambulatory Visit: Payer: Self-pay

## 2019-10-20 ENCOUNTER — Other Ambulatory Visit: Payer: Medicaid Other

## 2019-10-20 DIAGNOSIS — E1142 Type 2 diabetes mellitus with diabetic polyneuropathy: Secondary | ICD-10-CM

## 2019-10-20 DIAGNOSIS — Z7689 Persons encountering health services in other specified circumstances: Secondary | ICD-10-CM

## 2019-10-20 DIAGNOSIS — E785 Hyperlipidemia, unspecified: Secondary | ICD-10-CM

## 2019-10-21 ENCOUNTER — Ambulatory Visit: Payer: Medicaid Other | Admitting: Licensed Clinical Social Worker

## 2019-10-21 ENCOUNTER — Telehealth: Payer: Self-pay | Admitting: Licensed Clinical Social Worker

## 2019-10-21 DIAGNOSIS — F341 Dysthymic disorder: Secondary | ICD-10-CM

## 2019-10-21 DIAGNOSIS — F1011 Alcohol abuse, in remission: Secondary | ICD-10-CM

## 2019-10-21 LAB — URINALYSIS
Bilirubin, UA: NEGATIVE
Ketones, UA: NEGATIVE
Leukocytes,UA: NEGATIVE
Nitrite, UA: NEGATIVE
Protein,UA: NEGATIVE
RBC, UA: NEGATIVE
Specific Gravity, UA: >=1.03 — AB (ref 1.005–1.030)
Urobilinogen, Ur: 0.2 mg/dL (ref 0.2–1.0)
pH, UA: 5.5 (ref 5.0–7.5)

## 2019-10-21 LAB — LIPID PANEL
Chol/HDL Ratio: 3.4 ratio (ref 0.0–5.0)
Cholesterol, Total: 126 mg/dL (ref 100–199)
HDL: 37 mg/dL — ABNORMAL LOW (ref >39)
LDL Chol Calc (NIH): 60 mg/dL (ref 0–99)
Triglycerides: 175 mg/dL — ABNORMAL HIGH (ref 0–149)
VLDL Cholesterol Cal: 29 mg/dL (ref 5–40)

## 2019-10-21 LAB — HEMOGLOBIN A1C
Est. average glucose Bld gHb Est-mCnc: 174 mg/dL
Hgb A1c MFr Bld: 7.7 % — ABNORMAL HIGH (ref 4.8–5.6)

## 2019-10-21 NOTE — BH Specialist Note (Signed)
Integrated Behavioral Health Follow Up Visit Via Phone  MRN: [id number] Name: Christian [name]  Type of Service: [place] Interpretor:No. Interpretor Name and Language: not applicable.   SUBJECTIVE: Christian [name] is a 49 y.o. male accompanied by himself. Patient was referred by Carlyon Shadow NP for mental health.  Patient reports the following symptoms/concerns: Patient was agreeable to the plans and treatment recommendations.  Duration of problem: ; Severity of problem: severe  OBJECTIVE: Mood: Euthymic and Affect: Appropriate Risk of harm to self or others: No plan to harm self or others  LIFE CONTEXT: Family and Social: see above. School/Work: see above. Self-Care: see above. Life Changes: see above.   GOALS ADDRESSED: Patient will: 1.  Reduce symptoms of: depression and alcohol abuse  2.  Increase knowledge and/or ability of: coping skills, healthy habits and self-management skills  3.  Demonstrate ability to: Increase healthy adjustment to current life circumstances  INTERVENTIONS: Interventions utilized:  Solution-Focused Strategies was utilized by the clinician during today's follow up session. Clinician explained to the patient that she had a case consultation with Dr. Octavia Heir, MD, psychiatric consultant who recommended that he needs a higher level of care than [facility] is equipped to provide at this point in time and suggested that he be referred to [facility] services in [place]. Clinician asked the patient to come into the clinic to fill out a release of information to communicate with [facility] in regards to treatment recommendations and referral. Clinician informed the patient that until he can establish with another mental health provider that a 90 day supply of his Trazodone 50 mg at bedtime would be provide to him. Clinician also recommended that the patient join a local AA support group for his history of alcohol  abuse.  Standardized Assessments completed: Not Needed  ASSESSMENT: Patient currently experiencing   Patient may benefit from   PLAN: 1. Follow up with behavioral health clinician on :  2. Behavioral recommendations:  3. Referral(s): [place] (LME/Outside Clinic) Patient was given a handout regarding how to establish mental health care at [facility] behavioral health services. Clinician faxed over the release of information along with the assessment and treatment recommendations.  4. "From scale of 1-10, how likely are you to follow plan?": [place], LCSW

## 2019-10-21 NOTE — Telephone Encounter (Signed)
Clinician opened in error. HS

## 2019-10-26 ENCOUNTER — Encounter: Payer: Self-pay | Admitting: Gerontology

## 2019-10-26 ENCOUNTER — Ambulatory Visit: Payer: Medicaid Other | Admitting: Gerontology

## 2019-10-26 ENCOUNTER — Other Ambulatory Visit: Payer: Self-pay

## 2019-10-26 VITALS — BP 130/88 | HR 82 | Ht 66.0 in | Wt 158.0 lb

## 2019-10-26 DIAGNOSIS — E1142 Type 2 diabetes mellitus with diabetic polyneuropathy: Secondary | ICD-10-CM

## 2019-10-26 DIAGNOSIS — E785 Hyperlipidemia, unspecified: Secondary | ICD-10-CM

## 2019-10-26 DIAGNOSIS — I10 Essential (primary) hypertension: Secondary | ICD-10-CM

## 2019-10-26 MED ORDER — CANAGLIFLOZIN 100 MG PO TABS: 100.0000 mg | ORAL_TABLET | Freq: Every day | ORAL | 2 refills | Status: DC

## 2019-10-26 MED ORDER — CARVEDILOL 25 MG PO TABS: 25.0000 mg | ORAL_TABLET | Freq: Two times a day (BID) | ORAL | 2 refills | Status: DC

## 2019-10-26 MED ORDER — ATORVASTATIN CALCIUM 80 MG PO TABS: 80.0000 mg | ORAL_TABLET | Freq: Every day | ORAL | 2 refills | Status: DC

## 2019-10-26 MED ORDER — METFORMIN HCL 1000 MG PO TABS: 1000.0000 mg | ORAL_TABLET | Freq: Two times a day (BID) | ORAL | 2 refills | Status: DC

## 2019-10-26 MED ORDER — LOSARTAN POTASSIUM 25 MG PO TABS: 25.0000 mg | ORAL_TABLET | Freq: Every day | ORAL | 1 refills | Status: DC

## 2019-10-26 NOTE — Patient Instructions (Signed)
DASH Eating Plan DASH stands for "Dietary Approaches to Stop Hypertension." The DASH eating plan is a healthy eating plan that has been shown to reduce high blood pressure (hypertension). It may also reduce your risk for type 2 diabetes, heart disease, and stroke. The DASH eating plan may also help with weight loss. What are tips for following this plan?  General guidelines  Avoid eating more than 2,300 mg (milligrams) of salt (sodium) a day. If you have hypertension, you may need to reduce your sodium intake to 1,500 mg a day.  Limit alcohol intake to no more than 1 drink a day for nonpregnant women and 2 drinks a day for men. One drink equals 12 oz of beer, 5 oz of wine, or 1 oz of hard liquor.  Work with your health care provider to maintain a healthy body weight or to lose weight. Ask what an ideal weight is for you.  Get at least 30 minutes of exercise that causes your heart to beat faster (aerobic exercise) most days of the week. Activities may include walking, swimming, or biking.  Work with your health care provider or diet and nutrition specialist (dietitian) to adjust your eating plan to your individual calorie needs. Reading food labels   Check food labels for the amount of sodium per serving. Choose foods with less than 5 percent of the Daily Value of sodium. Generally, foods with less than 300 mg of sodium per serving fit into this eating plan.  To find whole grains, look for the word "whole" as the first word in the ingredient list. Shopping  Buy products labeled as "low-sodium" or "no salt added."  Buy fresh foods. Avoid canned foods and premade or frozen meals. Cooking  Avoid adding salt when cooking. Use salt-free seasonings or herbs instead of table salt or sea salt. Check with your health care provider or pharmacist before using salt substitutes.  Do not fry foods. Cook foods using healthy methods such as baking, boiling, grilling, and broiling instead.  Cook with  heart-healthy oils, such as olive, canola, soybean, or sunflower oil. Meal planning  Eat a balanced diet that includes: ? 5 or more servings of fruits and vegetables each day. At each meal, try to fill half of your plate with fruits and vegetables. ? Up to 6-8 servings of whole grains each day. ? Less than 6 oz of lean meat, poultry, or fish each day. A 3-oz serving of meat is about the same size as a deck of cards. One egg equals 1 oz. ? 2 servings of low-fat dairy each day. ? A serving of nuts, seeds, or beans 5 times each week. ? Heart-healthy fats. Healthy fats called Omega-3 fatty acids are found in foods such as flaxseeds and coldwater fish, like sardines, salmon, and mackerel.  Limit how much you eat of the following: ? Canned or prepackaged foods. ? Food that is high in trans fat, such as fried foods. ? Food that is high in saturated fat, such as fatty meat. ? Sweets, desserts, sugary drinks, and other foods with added sugar. ? Full-fat dairy products.  Do not salt foods before eating.  Try to eat at least 2 vegetarian meals each week.  Eat more home-cooked food and less restaurant, buffet, and fast food.  When eating at a restaurant, ask that your food be prepared with less salt or no salt, if possible. What foods are recommended? The items listed may not be a complete list. Talk with your dietitian about   what dietary choices are best for you. Grains Whole-grain or whole-wheat bread. Whole-grain or whole-wheat pasta. Brown rice. Oatmeal. Quinoa. Bulgur. Whole-grain and low-sodium cereals. Pita bread. Low-fat, low-sodium crackers. Whole-wheat flour tortillas. Vegetables Fresh or frozen vegetables (raw, steamed, roasted, or grilled). Low-sodium or reduced-sodium tomato and vegetable juice. Low-sodium or reduced-sodium tomato sauce and tomato paste. Low-sodium or reduced-sodium canned vegetables. Fruits All fresh, dried, or frozen fruit. Canned fruit in natural juice (without  added sugar). Meat and other protein foods Skinless chicken or turkey. Ground chicken or turkey. Pork with fat trimmed off. Fish and seafood. Egg whites. Dried beans, peas, or lentils. Unsalted nuts, nut butters, and seeds. Unsalted canned beans. Lean cuts of beef with fat trimmed off. Low-sodium, lean deli meat. Dairy Low-fat (1%) or fat-free (skim) milk. Fat-free, low-fat, or reduced-fat cheeses. Nonfat, low-sodium ricotta or cottage cheese. Low-fat or nonfat yogurt. Low-fat, low-sodium cheese. Fats and oils Soft margarine without trans fats. Vegetable oil. Low-fat, reduced-fat, or light mayonnaise and salad dressings (reduced-sodium). Canola, safflower, olive, soybean, and sunflower oils. Avocado. Seasoning and other foods Herbs. Spices. Seasoning mixes without salt. Unsalted popcorn and pretzels. Fat-free sweets. What foods are not recommended? The items listed may not be a complete list. Talk with your dietitian about what dietary choices are best for you. Grains Baked goods made with fat, such as croissants, muffins, or some breads. Dry pasta or rice meal packs. Vegetables Creamed or fried vegetables. Vegetables in a cheese sauce. Regular canned vegetables (not low-sodium or reduced-sodium). Regular canned tomato sauce and paste (not low-sodium or reduced-sodium). Regular tomato and vegetable juice (not low-sodium or reduced-sodium). Pickles. Olives. Fruits Canned fruit in a light or heavy syrup. Fried fruit. Fruit in cream or butter sauce. Meat and other protein foods Fatty cuts of meat. Ribs. Fried meat. Bacon. Sausage. Bologna and other processed lunch meats. Salami. Fatback. Hotdogs. Bratwurst. Salted nuts and seeds. Canned beans with added salt. Canned or smoked fish. Whole eggs or egg yolks. Chicken or turkey with skin. Dairy Whole or 2% milk, cream, and half-and-half. Whole or full-fat cream cheese. Whole-fat or sweetened yogurt. Full-fat cheese. Nondairy creamers. Whipped toppings.  Processed cheese and cheese spreads. Fats and oils Butter. Stick margarine. Lard. Shortening. Ghee. Bacon fat. Tropical oils, such as coconut, palm kernel, or palm oil. Seasoning and other foods Salted popcorn and pretzels. Onion salt, garlic salt, seasoned salt, table salt, and sea salt. Worcestershire sauce. Tartar sauce. Barbecue sauce. Teriyaki sauce. Soy sauce, including reduced-sodium. Steak sauce. Canned and packaged gravies. Fish sauce. Oyster sauce. Cocktail sauce. Horseradish that you find on the shelf. Ketchup. Mustard. Meat flavorings and tenderizers. Bouillon cubes. Hot sauce and Tabasco sauce. Premade or packaged marinades. Premade or packaged taco seasonings. Relishes. Regular salad dressings. Where to find more information:  National Heart, Lung, and Blood Institute: www.nhlbi.nih.gov  American Heart Association: www.heart.org Summary  The DASH eating plan is a healthy eating plan that has been shown to reduce high blood pressure (hypertension). It may also reduce your risk for type 2 diabetes, heart disease, and stroke.  With the DASH eating plan, you should limit salt (sodium) intake to 2,300 mg a day. If you have hypertension, you may need to reduce your sodium intake to 1,500 mg a day.  When on the DASH eating plan, aim to eat more fresh fruits and vegetables, whole grains, lean proteins, low-fat dairy, and heart-healthy fats.  Work with your health care provider or diet and nutrition specialist (dietitian) to adjust your eating plan to your   individual calorie needs. This information is not intended to replace advice given to you by your health care provider. Make sure you discuss any questions you have with your health care provider. Document Revised: 02/21/2017 Document Reviewed: 03/04/2016 Elsevier Patient Education  2020 Else[employer] Inc. Carbohydrate Counting for Diabetes Mellitus, Adult  Carbohydrate counting is a method of keeping track of how many carbohydrates you  eat. Eating carbohydrates naturally increases the amount of sugar (glucose) in the blood. Counting how many carbohydrates you eat helps keep your blood glucose within normal limits, which helps you manage your diabetes (diabetes mellitus). It is important to know how many carbohydrates you can safely have in each meal. This is different for every person. A diet and nutrition specialist (registered dietitian) can help you make a meal plan and calculate how many carbohydrates you should have at each meal and snack. Carbohydrates are found in the following foods:  Grains, such as breads and cereals.  Dried beans and soy products.  Starchy vegetables, such as potatoes, peas, and corn.  Fruit and fruit juices.  Milk and yogurt.  Sweets and snack foods, such as cake, cookies, candy, chips, and soft drinks. How do I count carbohydrates? There are two ways to count carbohydrates in food. You can use either of the methods or a combination of both. Reading "Nutrition Facts" on packaged food The "Nutrition Facts" list is included on the labels of almost all packaged foods and beverages in the [place] It includes:  The serving size.  Information about nutrients in each serving, including the grams (g) of carbohydrate per serving. To use the "Nutrition Facts":  Decide how many servings you will have.  Multiply the number of servings by the number of carbohydrates per serving.  The resulting number is the total amount of carbohydrates that you will be having. Learning standard serving sizes of other foods When you eat carbohydrate foods that are not packaged or do not include "Nutrition Facts" on the label, you need to measure the servings in order to count the amount of carbohydrates:  Measure the foods that you will eat with a food scale or measuring cup, if needed.  Decide how many standard-size servings you will eat.  Multiply the number of servings by 15. Most carbohydrate-rich foods have  about 15 g of carbohydrates per serving. ? For example, if you eat 8 oz (170 g) of strawberries, you will have eaten 2 servings and 30 g of carbohydrates (2 servings x 15 g = 30 g).  For foods that have more than one food mixed, such as soups and casseroles, you must count the carbohydrates in each food that is included. The following list contains standard serving sizes of common carbohydrate-rich foods. Each of these servings has about 15 g of carbohydrates:   hamburger bun or  English muffin.   oz (15 mL) syrup.   oz (14 g) jelly.  1 slice of bread.  1 six-inch tortilla.  3 oz (85 g) cooked rice or pasta.  4 oz (113 g) cooked dried beans.  4 oz (113 g) starchy vegetable, such as peas, corn, or potatoes.  4 oz (113 g) hot cereal.  4 oz (113 g) mashed potatoes or  of a large baked potato.  4 oz (113 g) canned or frozen fruit.  4 oz (120 mL) fruit juice.  4-6 crackers.  6 chicken nuggets.  6 oz (170 g) unsweetened dry cereal.  6 oz (170 g) plain fat-free yogurt or yogurt sweetened with   artificial sweeteners.  8 oz (240 mL) milk.  8 oz (170 g) fresh fruit or one small piece of fruit.  24 oz (680 g) popped popcorn. Example of carbohydrate counting Sample meal  3 oz (85 g) chicken breast.  6 oz (170 g) brown rice.  4 oz (113 g) corn.  8 oz (240 mL) milk.  8 oz (170 g) strawberries with sugar-free whipped topping. Carbohydrate calculation 1. Identify the foods that contain carbohydrates: ? Rice. ? Corn. ? Milk. ? Strawberries. 2. Calculate how many servings you have of each food: ? 2 servings rice. ? 1 serving corn. ? 1 serving milk. ? 1 serving strawberries. 3. Multiply each number of servings by 15 g: ? 2 servings rice x 15 g = 30 g. ? 1 serving corn x 15 g = 15 g. ? 1 serving milk x 15 g = 15 g. ? 1 serving strawberries x 15 g = 15 g. 4. Add together all of the amounts to find the total grams of carbohydrates eaten: ? 30 g + 15 g + 15 g + 15  g = 75 g of carbohydrates total. Summary  Carbohydrate counting is a method of keeping track of how many carbohydrates you eat.  Eating carbohydrates naturally increases the amount of sugar (glucose) in the blood.  Counting how many carbohydrates you eat helps keep your blood glucose within normal limits, which helps you manage your diabetes.  A diet and nutrition specialist (registered dietitian) can help you make a meal plan and calculate how many carbohydrates you should have at each meal and snack. This information is not intended to replace advice given to you by your health care provider. Make sure you discuss any questions you have with your health care provider. Document Revised: 10/03/2016 Document Reviewed: 08/23/2015 Elsevier Patient Education  2020 Elsevier Inc.  

## 2019-10-26 NOTE — Progress Notes (Signed)
Established Patient Office Visit  Subjective:  Patient ID: Christian [name], male    DOB: 29-Dec-1970  Age: 49 y.o. MRN: [id number]  CC:  Chief Complaint  Patient presents with  . Hypertension  . Diabetes    HPI Christian [name] presents for follow up of hypertension, type 2 diabetes mellitus and lab review. His HgbA1c done on 10/20/2019 decreased from 9.9% to 7.7% and he states that he's compliant with his medications and continues to make healthy lifestyle modifications. He checks his blood glucose twice daily and brought his log to appointment. His fasting blood glucose readings ranges between 125-170 mg/dl and his evening readings ranges between 160-200 mg/dl. His fasting blood glucose today was 170 mg/dl. He denies hypoglycemic symptoms, but endorses intermittent mild peripheral neuropathy. He checks his blood pressure twice daily and states that he takes 50 mg Losartan every other day. He states that he skips Losartan if his blood pressure is less than 120/80 because of light headedness. He will reschedule his Cardiology appointment. He states that his mood is good, he denies suicidal nor homicidal ideation. Overall, he states that he's doing well and offers no further complaint.  Past Medical History:  Diagnosis Date  . CAD (coronary artery disease)    a. 07/2013 NSTEMI/PCI: LCX 44m(4.0x23 Xience DES), RCA 410mEF > 55%;  b. 07/2013 Echo: EF 60-65%, diast dysfxn, mild LVH, mildly dil LA, nl RV size/fxn, nl RVSP.  . Diabetes mellitus without complication ([place]  . History of tobacco abuse    a. quit ~ 2010  . Hyperlipidemia   . Morbid obesity ([place]   a. weighed 168 @ age 49 . Scoliosis     Past Surgical History:  Procedure Laterality Date  . ADENOIDECTOMY    . CARDIAC CATHETERIZATION  07/2013   [facility]'x1 stent    Family History  Problem Relation Age of Onset  . Heart disease Mother   . Esophageal cancer Father     Social History   Socioeconomic History  . Marital status:  Single    Spouse name: Not on file  . Number of children: Not on file  . Years of education: Not on file  . Highest education level: Not on file  Occupational History  . Not on file  Tobacco Use  . Smoking status: Former Smoker    Packs/day: 0.50    Years: 20.00    Pack years: 10.00    Types: Cigarettes  . Smokeless tobacco: Never Used  Vaping Use  . Vaping Use: Never used  Substance and Sexual Activity  . Alcohol use: Not Currently  . Drug use: No    Types: Marijuana    Comment: past  . Sexual activity: Not on file  Other Topics Concern  . Not on file  Social History Narrative  . Not on file   Social Determinants of Health   Financial Resource Strain:   . Difficulty of Paying Living Expenses:   Food Insecurity:   . Worried About RuCharity fundraisern the Last Year:   . RaArboriculturistn the Last Year:   Transportation Needs:   . LaFilm/video editorMedical):   . Marland Kitchenack of Transportation (Non-Medical):   Physical Activity:   . Days of Exercise per Week:   . Minutes of Exercise per Session:   Stress:   . Feeling of Stress :   Social Connections:   . Frequency of Communication with Friends and Family:   .  Frequency of Social Gatherings with Friends and Family:   . Attends Religious Services:   . Active Member of Clubs or Organizations:   . Attends Archivist Meetings:   Marland Kitchen Marital Status:   Intimate Partner Violence:   . Fear of Current or Ex-Partner:   . Emotionally Abused:   Marland Kitchen Physically Abused:   . Sexually Abused:     Outpatient Medications Prior to Visit  Medication Sig Dispense Refill  . clopidogrel (PLAVIX) 75 MG tablet Take 1 tablet (75 mg total) by mouth daily. 30 tablet 0  . traZODone (DESYREL) 50 MG tablet Take 1 tablet (50 mg total) by mouth at bedtime as needed for sleep. 30 tablet 1  . atorvastatin (LIPITOR) 80 MG tablet Take 1 tablet (80 mg total) by mouth daily at 6 PM. 30 tablet 1  . canagliflozin (INVOKANA) 100 MG TABS tablet  Take 1 tablet (100 mg total) by mouth daily before breakfast. 30 tablet 1  . carvedilol (COREG) 25 MG tablet Take 1 tablet (25 mg total) by mouth 2 (two) times daily with a meal. 60 tablet 1  . losartan (COZAAR) 50 MG tablet Take 1 tablet (50 mg total) by mouth daily. 30 tablet 0  . metFORMIN (GLUCOPHAGE) 1000 MG tablet Take 1 tablet (1,000 mg total) by mouth 2 (two) times daily with a meal. 60 tablet 1  . blood glucose meter kit and supplies KIT Dispense based on patient and insurance preference. Use up to four times daily as directed. (FOR ICD-9 250.00, 250.01). 1 each 0   No facility-administered medications prior to visit.    No Known Allergies  ROS Review of Systems  Constitutional: Negative.   Eyes: Negative.   Respiratory: Negative.   Cardiovascular: Negative.   Endocrine: Negative.   Skin: Negative.   Neurological: Positive for numbness (intermittent peripheral neuropathy).  Psychiatric/Behavioral: Negative.       Objective:    Physical Exam HENT:     Head: Normocephalic and atraumatic.  Eyes:     Extraocular Movements: Extraocular movements intact.     Pupils: Pupils are equal, round, and reactive to light.  Cardiovascular:     Rate and Rhythm: Normal rate and regular rhythm.     Pulses: Normal pulses.     Heart sounds: Normal heart sounds.  Pulmonary:     Effort: Pulmonary effort is normal.     Breath sounds: Normal breath sounds.  Skin:    General: Skin is warm.  Neurological:     General: No focal deficit present.     Mental Status: He is alert and oriented to person, place, and time. Mental status is at baseline.  Psychiatric:        Mood and Affect: Mood normal.        Behavior: Behavior normal.        Thought Content: Thought content normal.        Judgment: Judgment normal.     BP 130/88 (BP Location: Right Arm, Patient Position: Sitting)   Pulse 82   Ht '5\' 6"'$  (1.676 m)   Wt 158 lb (71.7 kg)   SpO2 100%   BMI 25.50 kg/m  Wt Readings from Last  3 Encounters:  10/26/19 158 lb (71.7 kg)  09/28/19 162 lb 11.2 oz (73.8 kg)  08/20/19 152 lb (68.9 kg)     Health Maintenance Due  Topic Date Due  . Hepatitis C Screening  Never done  . OPHTHALMOLOGY EXAM  Never done  . COVID-19 Vaccine (  1) Never done  . TETANUS/TDAP  Never done  . COLONOSCOPY  Never done  . INFLUENZA VACCINE  10/24/2019    There are no preventive care reminders to display for this patient.  Lab Results  Component Value Date   TSH 2.720 04/22/2019   Lab Results  Component Value Date   WBC 6.3 08/23/2019   HGB 12.1 (L) 08/23/2019   HCT 32.9 (L) 08/23/2019   MCV 85.9 08/23/2019   PLT 260 08/23/2019   Lab Results  Component Value Date   NA 138 08/23/2019   K 3.8 08/23/2019   CO2 23 08/23/2019   GLUCOSE 181 (H) 08/23/2019   BUN 7 08/23/2019   CREATININE 0.57 (L) 08/23/2019   BILITOT 1.6 (H) 08/19/2019   ALKPHOS 65 08/19/2019   AST 44 (H) 08/19/2019   ALT 40 08/19/2019   PROT 7.8 08/19/2019   ALBUMIN 4.9 08/19/2019   CALCIUM 9.0 08/23/2019   ANIONGAP 11 08/23/2019   Lab Results  Component Value Date   CHOL 126 10/20/2019   Lab Results  Component Value Date   HDL 37 (L) 10/20/2019   Lab Results  Component Value Date   LDLCALC 60 10/20/2019   Lab Results  Component Value Date   TRIG 175 (H) 10/20/2019   Lab Results  Component Value Date   CHOLHDL 3.4 10/20/2019   Lab Results  Component Value Date   HGBA1C 7.7 (H) 10/20/2019      Assessment & Plan:    1. Type 2 diabetes mellitus with diabetic polyneuropathy, unspecified whether long term insulin use (HCC) - His HgbA1c was 7.7%, his goal should be less than 7%. His blood glucose is improving, he was encouraged to continue checking blood glucose bid, record and bring to follow up appointment. He was advised that his fasting blood glucose should be between 80-130 mg/dl. He will continue on current treatment regimen, low carb/ non concentrated sweet diet and exercise as tolerated. He  declines treatment for Peripheral neuropathy, was advised to notify clinic for worsening symptoms. - metFORMIN (GLUCOPHAGE) 1000 MG tablet; Take 1 tablet (1,000 mg total) by mouth 2 (two) times daily with a meal.  Dispense: 60 tablet; Refill: 2 - canagliflozin (INVOKANA) 100 MG TABS tablet; Take 1 tablet (100 mg total) by mouth daily before breakfast.  Dispense: 30 tablet; Refill: 2  2. Essential hypertension - His blood pressure is under control, his Losartan was decreased to 25 mg daily. He was encouraged to schedule an appointment with the Cardiologist. He will continue to check his blood pressure daily, record and notify clinic if less than 100/80 and if any light headedness or dizziness. He's to continue on DASH diet and exercise as tolerated. - carvedilol (COREG) 25 MG tablet; Take 1 tablet (25 mg total) by mouth 2 (two) times daily with a meal.  Dispense: 60 tablet; Refill: 2 - losartan (COZAAR) 25 MG tablet; Take 1 tablet (25 mg total) by mouth daily.  Dispense: 30 tablet; Refill: 1  3. Hyperlipidemia, unspecified hyperlipidemia type - He will continue on current treatment regimen, low fat/cholesterol diet and exercise as tolerated. - atorvastatin (LIPITOR) 80 MG tablet; Take 1 tablet (80 mg total) by mouth daily at 6 PM.  Dispense: 30 tablet; Refill: 2    Follow-up: Return in about 5 weeks (around 12/01/2019), or if symptoms worsen or fail to improve.    Christian [name] Jerold Coombe, NP

## 2019-11-03 ENCOUNTER — Other Ambulatory Visit: Payer: Self-pay

## 2019-11-03 DIAGNOSIS — I639 Cerebral infarction, unspecified: Secondary | ICD-10-CM

## 2019-11-03 MED ORDER — CLOPIDOGREL BISULFATE 75 MG PO TABS: 75.0000 mg | ORAL_TABLET | Freq: Every day | ORAL | 0 refills | Status: DC

## 2019-11-11 DIAGNOSIS — I2510 Atherosclerotic heart disease of native coronary artery without angina pectoris: Secondary | ICD-10-CM | POA: Insufficient documentation
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
## 2019-11-12 ENCOUNTER — Ambulatory Visit (INDEPENDENT_AMBULATORY_CARE_PROVIDER_SITE_OTHER): Payer: Self-pay | Admitting: Cardiovascular Disease

## 2019-11-12 ENCOUNTER — Encounter: Payer: Self-pay | Admitting: Cardiovascular Disease

## 2019-11-12 ENCOUNTER — Other Ambulatory Visit: Payer: Self-pay

## 2019-11-12 VITALS — BP 110/70 | HR 72 | Ht 66.0 in | Wt 158.0 lb

## 2019-11-12 DIAGNOSIS — I10 Essential (primary) hypertension: Secondary | ICD-10-CM

## 2019-11-12 DIAGNOSIS — E782 Mixed hyperlipidemia: Secondary | ICD-10-CM

## 2019-11-12 DIAGNOSIS — I639 Cerebral infarction, unspecified: Secondary | ICD-10-CM

## 2019-11-12 DIAGNOSIS — I25118 Atherosclerotic heart disease of native coronary artery with other forms of angina pectoris: Secondary | ICD-10-CM

## 2019-11-12 MED ORDER — CLOPIDOGREL BISULFATE 75 MG PO TABS: 75.0000 mg | ORAL_TABLET | Freq: Every day | ORAL | 2 refills | Status: DC

## 2019-11-12 MED ORDER — CLOPIDOGREL BISULFATE 75 MG PO TABS: 75.0000 mg | ORAL_TABLET | Freq: Every day | ORAL | 3 refills | Status: DC

## 2019-11-12 NOTE — Patient Instructions (Addendum)
Medication Instructions:  Stay on asa and plavix  If you need a refill on your cardiac medications before your next appointment, please call your pharmacy.    Lab work: No new labs needed   If you have labs (blood work) drawn today and your tests are completely normal, you will receive your results only by: Marland Kitchen MyChart Message (if you have MyChart) OR . A paper copy in the mail If you have any lab test that is abnormal or we need to change your treatment, we will call you to review the results.   Testing/Procedures: No new testing needed   Follow-Up: At [facility], you and your health needs are our priority.  As part of our continuing mission to provide you with exceptional heart care, we have created designated Provider Care Teams.  These Care Teams include your primary Cardiologist (physician) and Advanced Practice Providers (APPs -  Physician Assistants and Nurse Practitioners) who all work together to provide you with the care you need, when you need it.  . You will need a follow up appointment in 12 months   . Providers on your designated Care Team:   . Murray Hodgkins, NP . Christell Faith, PA-C . Marrianne Mood, PA-C  Any Other Special Instructions Will Be Listed Below (If Applicable).  COVID-19 Vaccine Information can be found at: ShippingScam.co.uk For questions related to vaccine distribution or appointments, please email [email or web address] or call [phone].

## 2019-12-01 ENCOUNTER — Telehealth: Payer: Self-pay | Admitting: Licensed Clinical Social Worker

## 2019-12-02 ENCOUNTER — Other Ambulatory Visit: Payer: Self-pay

## 2019-12-02 ENCOUNTER — Encounter: Payer: Self-pay | Admitting: Gerontology

## 2019-12-02 ENCOUNTER — Ambulatory Visit: Payer: Medicaid Other | Admitting: Gerontology

## 2019-12-02 VITALS — BP 94/63 | HR 76 | Wt 158.2 lb

## 2019-12-02 DIAGNOSIS — Z0000 Encounter for general adult medical examination without abnormal findings: Secondary | ICD-10-CM

## 2019-12-02 DIAGNOSIS — E1142 Type 2 diabetes mellitus with diabetic polyneuropathy: Secondary | ICD-10-CM

## 2019-12-02 DIAGNOSIS — I10 Essential (primary) hypertension: Secondary | ICD-10-CM

## 2019-12-02 DIAGNOSIS — R1011 Right upper quadrant pain: Secondary | ICD-10-CM | POA: Insufficient documentation

## 2019-12-02 MED ORDER — CARVEDILOL 25 MG PO TABS: 25.0000 mg | ORAL_TABLET | Freq: Every day | ORAL | 2 refills | Status: DC

## 2019-12-02 NOTE — Progress Notes (Signed)
Established Patient Office Visit  Subjective:  Patient ID: Christian [name], male    DOB: 09/01/1970  Age: 49 y.o. MRN: [id number]  CC:  Chief Complaint  Patient presents with  . Mass    bump near right rib, dull pain comes and goes  . Diabetes Mellitus    coming down, no 200 since august 15th    HPI Christian [name] presents for blood pressure follow up, type 2 diabetes mellitus and pain in right upper abdominal area. Patient checks his blood pressure twice daily and brought his log to appointment. His blood pressure ranges from 100-120/60-80 according to his log. He reports compliance with his blood pressure medication. He denies dizziness or lightheadedness. His A1C done on 7/28 was 7.7%. Patient also logs his blood glucose twice daily with no readings over 200 since 8/15. He reports compliance with his medications and making healthy lifestyle modifications. Denies any hypoglycemia episodes. He also presents today with right upper quadrant abdominal pain that he has been experiencing for 2 weeks. He states that the pain is intermittent and happens about 6 times per day. Pain radiated from the RUQ to his back. He describes the pain as dull and states that it always goes away with time within 1-2 minutes. He denies aggravating factors. He rates the pain as 6/10 when it occurs. He denies vomiting, nausea, diarrhea, fevers, or appetite changes. Reports that he is doing well overall and has no other complaints.   Past Medical History:  Diagnosis Date  . CAD (coronary artery disease)    a. 07/2013 NSTEMI/PCI: LCX 23m (4.0x23 Xience DES), RCA 46m, EF > 55%;  b. 07/2013 Echo: EF 60-65%, diast dysfxn, mild LVH, mildly dil LA, nl RV size/fxn, nl RVSP.  . Diabetes mellitus without complication ([place])   . History of tobacco abuse    a. quit ~ 2010  . Hyperlipidemia   . Morbid obesity ([place])    a. weighed 168 @ age 12.  . Scoliosis     Past Surgical History:  Procedure Laterality Date  . ADENOIDECTOMY     . CARDIAC CATHETERIZATION  07/2013   [facility]x1 stent    Family History  Problem Relation Age of Onset  . Heart disease Mother   . Esophageal cancer Father     Social History   Socioeconomic History  . Marital status: Single    Spouse name: Not on file  . Number of children: Not on file  . Years of education: Not on file  . Highest education level: Not on file  Occupational History  . Not on file  Tobacco Use  . Smoking status: Former Smoker    Types: Cigarettes  . Smokeless tobacco: Never Used  Vaping Use  . Vaping Use: Never used  Substance and Sexual Activity  . Alcohol use: Not Currently    Comment: Reports 3 months sober, previously drank about a fifth of liquor daily  . Drug use: No    Types: Marijuana    Comment: past  . Sexual activity: Not on file  Other Topics Concern  . Not on file  Social History Narrative  . Not on file   Social Determinants of Health   Financial Resource Strain:   . Difficulty of Paying Living Expenses: Not on file  Food Insecurity:   . Worried About Charity fundraiser in the Last Year: Not on file  . Ran Out of Food in the Last Year: Not on file  Transportation Needs:   .  Lack of Transportation (Medical): Not on file  . Lack of Transportation (Non-Medical): Not on file  Physical Activity:   . Days of Exercise per Week: Not on file  . Minutes of Exercise per Session: Not on file  Stress:   . Feeling of Stress : Not on file  Social Connections:   . Frequency of Communication with Friends and Family: Not on file  . Frequency of Social Gatherings with Friends and Family: Not on file  . Attends Religious Services: Not on file  . Active Member of Clubs or Organizations: Not on file  . Attends Archivist Meetings: Not on file  . Marital Status: Not on file  Intimate Partner Violence:   . Fear of Current or Ex-Partner: Not on file  . Emotionally Abused: Not on file  . Physically Abused: Not on file  . Sexually Abused:  Not on file    Outpatient Medications Prior to Visit  Medication Sig Dispense Refill  . aspirin EC 81 MG tablet Take 1 tablet (81 mg total) by mouth daily. Swallow whole. 90 tablet 3  . blood glucose meter kit and supplies KIT Dispense based on patient and insurance preference. Use up to four times daily as directed. (FOR ICD-9 250.00, 250.01). 1 each 0  . canagliflozin (INVOKANA) 100 MG TABS tablet Take 1 tablet (100 mg total) by mouth daily before breakfast. 30 tablet 2  . clopidogrel (PLAVIX) 75 MG tablet Take 1 tablet (75 mg total) by mouth daily. 90 tablet 3  . metFORMIN (GLUCOPHAGE) 1000 MG tablet Take 1 tablet (1,000 mg total) by mouth 2 (two) times daily with a meal. 60 tablet 2  . carvedilol (COREG) 25 MG tablet Take 1 tablet (25 mg total) by mouth 2 (two) times daily with a meal. 60 tablet 2  . losartan (COZAAR) 25 MG tablet Take 1 tablet (25 mg total) by mouth daily. 30 tablet 1  . atorvastatin (LIPITOR) 80 MG tablet Take 1 tablet (80 mg total) by mouth daily at 6 PM. 30 tablet 2  . traZODone (DESYREL) 50 MG tablet Take 1 tablet (50 mg total) by mouth at bedtime as needed for sleep. (Patient not taking: Reported on 11/12/2019) 30 tablet 1   No facility-administered medications prior to visit.    No Known Allergies  ROS Review of Systems  Constitutional: Negative for appetite change and fever.  Respiratory: Negative.   Cardiovascular: Negative.   Gastrointestinal: Positive for abdominal pain. Negative for blood in stool, constipation, diarrhea, nausea and vomiting.       Intermittent, dull RUQ abdominal pain; LBM 9/8  Skin: Negative.   Neurological: Negative.   Psychiatric/Behavioral: Negative.       Objective:    Physical Exam Constitutional:      Appearance: Normal appearance.  Cardiovascular:     Rate and Rhythm: Normal rate and regular rhythm.     Heart sounds: Normal heart sounds.  Pulmonary:     Effort: Pulmonary effort is normal.     Breath sounds: Normal  breath sounds.  Abdominal:     General: Bowel sounds are normal.     Tenderness: There is abdominal tenderness.     Comments: RUQ tenderness with palpation, Negative Murphy's sign, Negative Rovsing's sign  Skin:    General: Skin is warm and dry.     Capillary Refill: Capillary refill takes less than 2 seconds.  Neurological:     General: No focal deficit present.     Mental Status: He is alert.  Psychiatric:        Mood and Affect: Mood normal.     BP 94/63 (BP Location: Right Arm)   Pulse 76   Wt 158 lb 3.2 oz (71.8 kg)   SpO2 96%   BMI 25.53 kg/m  Wt Readings from Last 3 Encounters:  12/02/19 158 lb 3.2 oz (71.8 kg)  11/12/19 158 lb (71.7 kg)  10/26/19 158 lb (71.7 kg)     Health Maintenance Due  Topic Date Due  . Hepatitis C Screening  Never done  . OPHTHALMOLOGY EXAM  Never done  . URINE MICROALBUMIN  Never done  . COVID-19 Vaccine (1) Never done  . TETANUS/TDAP  Never done  . COLONOSCOPY  Never done  . INFLUENZA VACCINE  Never done    There are no preventive care reminders to display for this patient.  Lab Results  Component Value Date   TSH 2.720 04/22/2019   Lab Results  Component Value Date   WBC 6.3 08/23/2019   HGB 12.1 (L) 08/23/2019   HCT 32.9 (L) 08/23/2019   MCV 85.9 08/23/2019   PLT 260 08/23/2019   Lab Results  Component Value Date   NA 138 08/23/2019   K 3.8 08/23/2019   CO2 23 08/23/2019   GLUCOSE 181 (H) 08/23/2019   BUN 7 08/23/2019   CREATININE 0.57 (L) 08/23/2019   BILITOT 1.6 (H) 08/19/2019   ALKPHOS 65 08/19/2019   AST 44 (H) 08/19/2019   ALT 40 08/19/2019   PROT 7.8 08/19/2019   ALBUMIN 4.9 08/19/2019   CALCIUM 9.0 08/23/2019   ANIONGAP 11 08/23/2019   Lab Results  Component Value Date   CHOL 126 10/20/2019   Lab Results  Component Value Date   HDL 37 (L) 10/20/2019   Lab Results  Component Value Date   LDLCALC 60 10/20/2019   Lab Results  Component Value Date   TRIG 175 (H) 10/20/2019   Lab Results   Component Value Date   CHOLHDL 3.4 10/20/2019   Lab Results  Component Value Date   HGBA1C 7.7 (H) 10/20/2019      Assessment & Plan:    1. Type 2 diabetes mellitus with diabetic polyneuropathy, unspecified whether long term insulin use (HCC) His A1C was 7.7% and his goal should be under 7%. Fasting blood glucose should be between 70-130mg /dL. Continue your medications as prescribed. Continue checking and logging your blood sugar twice daily. Continue your low carb and low concentrated sugar diet and lifestyle modifications.   2. Essential hypertension Blood pressure at visit was 94/63. Discontinue Losartan. Take carvedilol (Coreg) 25mg  once daily instead of twice daily. Continue logging blood pressure twice daily and following the DASH diet.   - carvedilol (COREG) 25 MG tablet; Take 1 tablet (25 mg total) by mouth daily.  Dispense: 30 tablet; Refill: 2  3. Abdominal pain, acute, right upper quadrant Tenderness noted with palpation. Negative Murphy's sign, negative Rovsing's sign on assessment. No lab or imaging available in clinic. Patient was advised to go to the nearest emergency room with any worsening or new abdominal symptoms and verbalized understanding.   - CBC w/Diff; Future - Comp Met (CMET); Future - Lipase; Future - Urinalysis, Routine w reflex microscopic; Future - Ambulatory referral to Gastroenterology  4. Healthcare maintenance - Ambulatory referral to Gastroenterology for colonoscopy screening and intermittent RUQ pain.    Follow-up: Return in about 13 days (around 12/15/2019), or if symptoms worsen or fail to improve. Lab draw scheduled for 12/08/2019.   Mikale Silversmith T,  RN, NP Student

## 2019-12-02 NOTE — Patient Instructions (Addendum)
Losartan 25mg  discontinued. Take Coreg 25mg  daily instead of twice daily.   Please go to the nearest emergency department if your abdominal pain or abdominal symptoms worsen.

## 2019-12-02 NOTE — Assessment & Plan Note (Deleted)
Discontinue Losartan. Take carvedilol (Coreg) 25mg  once daily instead of twice daily. Continue logging blood pressure twice daily and following the DASH diet.

## 2019-12-02 NOTE — Assessment & Plan Note (Deleted)
Continue your medications as prescribed. Continue checking and logging your blood sugar twice daily. Continue your low carb diet and lifestyle modifications.

## 2019-12-08 ENCOUNTER — Other Ambulatory Visit: Payer: Medicaid Other

## 2019-12-08 ENCOUNTER — Other Ambulatory Visit: Payer: Self-pay

## 2019-12-08 DIAGNOSIS — R1011 Right upper quadrant pain: Secondary | ICD-10-CM

## 2019-12-09 ENCOUNTER — Ambulatory Visit: Payer: Medicaid Other

## 2019-12-09 DIAGNOSIS — Z79899 Other long term (current) drug therapy: Secondary | ICD-10-CM

## 2019-12-09 NOTE — Progress Notes (Cosign Needed)
[facility] Visit Note  Patient: FILIPE GREATHOUSE MRN: [id number] Date of Birth: 05-24-1970 PCP: Patient, No Pcp Per   [name] 49 y.o. male presents for a MTM visit today via telephone. Patient was identified by DOB.  There were no vitals taken for this visit.  Patient Information   Past Medical History:  Diagnosis Date  . CAD (coronary artery disease)    a. 07/2013 NSTEMI/PCI: LCX 43m(4.0x23 Xience DES), RCA 464mEF > 55%;  b. 07/2013 Echo: EF 60-65%, diast dysfxn, mild LVH, mildly dil LA, nl RV size/fxn, nl RVSP.  . [place]  . History of tobacco abuse    a. quit ~ 2010  . Hyperlipidemia   . [facility] [place] [place]   a. weighed 168 @ age 49 . Scoliosis   [facility]      Past Surgical History:  Procedure Laterality Date  . ADENOIDECTOMY    . CARDIAC CATHETERIZATION  07/2013   [facility]x1 stent     Family History  Problem Relation Age of Onset  . Heart disease Mother   . Esophageal cancer Father   . 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 Types: Cigarettes  Smokeless Tobacco Never Used      Health Maintenance  Topic Date Due  . Hepatitis C Screening  Never done  . OPHTHALMOLOGY EXAM  Never done  . URINE MICROALBUMIN  Never done  . COVID-19 Vaccine (1) Never done  . TETANUS/TDAP  Never done  . COLONOSCOPY  Never done  . INFLUENZA VACCINE  Never done  . PNEUMOCOCCAL POLYSACCHARIDE VACCINE AGE 16-64 HIGH RISK  10/25/2020 (Originally 03/23/1973)  . HEMOGLOBIN A1C  04/21/2020  . FOOT EXAM  10/25/2020  . HIV Screening  Completed   Health Maintenance/Date Completed  Last ED visit: 08/20/2019 Last Visit to PCP: 9/9 Next  Visit to PCP: 9/23 Specialist Visit: Does not currently have appointment. Per patient, f/u visit will be one year from 11/12/2019, and office will call patient 6 months ahead to schedule. Patient reports that he [facility], and plans to call them soon. Dental Exam: not recently Eye Exam: wears reading glasses but feels vision is fine; none recently Prostate Exam: ~2005 Colonoscopy: Appointment scheduled for 02/22/2020. Patient aware. Flu Vaccine: desires to receive flu vaccine from [facility] on 9/28. Pneumonia Vaccine: No. COVID-19 Vaccine: No.  Shingrix Vaccine: No.   Outpatient Encounter Medications as of 12/09/2019  Medication Sig  . aspirin EC 81 MG tablet Take 1 tablet (81 mg total) by mouth daily. Swallow whole.  . Marland Kitchentorvastatin (LIPITOR) 80 MG tablet Take 1 tablet (80 mg total) by mouth daily at 6 PM.  . blood glucose meter kit and supplies KIT Dispense based on patient and insurance preference. Use up to four times daily as directed. (FOR ICD-9 250.00, 250.01).  . canagliflozin (INVOKANA) 100 MG TABS tablet Take 1 tablet (100 mg total) by mouth daily before breakfast.  . carvedilol (COREG) 25 MG tablet Take 1 tablet (25 mg total) by mouth daily.  .Marland Kitchen  clopidogrel (PLAVIX) 75 MG tablet Take 1 tablet (75 mg total) by mouth daily.  Marland Kitchen gabapentin (NEURONTIN) 300 MG capsule Take 300 mg by mouth 3 (three) times daily. Take one capsule by mouth 3 times a day  . metFORMIN (GLUCOPHAGE) 1000 MG tablet Take 1 tablet (1,000 mg total) by mouth 2 (two) times daily with a meal.  . traZODone (DESYREL) 50 MG tablet Take 1 tablet (50 mg total) by mouth at bedtime as needed for sleep. (Patient not taking: Reported on 11/12/2019)   No facility-administered encounter medications on file as of 12/09/2019.     Assessment and Plan:   BP: Patient denies symptoms of orthostasis (lightheadedness). At last visit with [facility], losartan was discontinued and  carvedilol was decreased from twice daily to once daily. Patient is currently taking carvedilol in the morning, but is also still taking losartan 25 mg as needed. Patient is typically taking losartan 1-2 times / week, and takes a dose of losartan when he sees SBPs over 130. Patient is aware that [facility] discontinued losartan on September 9th. Currently taking blood pressure twice daily (8AM and 6PM). Asked patient to read his last 3 days blood pressures, which is provided below. In response, advised patient to bring his blood pressure log to appointment at [facility] on 9/23.   Patient's blood pressure readings over past 3 days, reported via telephone: 12/09/2019 (Today): 123/80 (8AM) 12/08/2019 (Yesterday): 138/90 (8AM-afterwards, patient took losartan 25 mg 1 tab) and 110/73 (6PM) 12/07/2019: 128/82 (8AM), 134/81 (6PM) -Patient did not take losartan on 12/09/2019 or 12/07/2019.  Sleep: Patient had been taking trazodone in the past, however is not currently taking. Patient reports no longer needing trazodone after gabapentin was started on 11/04/2019. Patient reports that once he lays down in bed, he falls asleep quickly.  Last dose traozodone  ~8/12.    T2DM: Diabetes currently uncontrolled with A1c>7.0. Patient reports checking BG twice daily with FBG typically ~130-140. Encouraged to continue checking BG and report results to PCP at [facility] during appointment next week.   Health maintenance: Asked patient if he would like to receive his flu vaccine on September 28th. Patient is agreeable, but would require a ride to the pharmacy. Will follow up with [employer] regarding transportation logistics. Patient expresses concern over a lump in his stomach. Encouraged patient to speak with his doctor about this during his next appointment at [facility] on September 23rd.      RTC: 12 months   Glean Salvo, Festus Aloe PharmD Student

## 2019-12-09 NOTE — Progress Notes (Incomplete Revision)
[facility] Clinic Visit Note  Patient: Christian [name] MRN: [id number] Date of Birth: 05-24-1970 PCP: Patient, No Pcp Per   [name] 49 y.o. male presents for a MTM visit today via telephone. Patient was identified by DOB.  There were no vitals taken for this visit.  Patient Information   Past Medical History:  Diagnosis Date  . CAD (coronary artery disease)    a. 07/2013 NSTEMI/PCI: LCX 43m(4.0x23 Xience DES), RCA 464mEF > 55%;  b. 07/2013 Echo: EF 60-65%, diast dysfxn, mild LVH, mildly dil LA, nl RV size/fxn, nl RVSP.  . [place]  . History of tobacco abuse    a. quit ~ 2010  . Hyperlipidemia   . [place] ([facility]   a. weighed 168 @ age 49 . Scoliosis   [facility]      Past Surgical History:  Procedure Laterality Date  . ADENOIDECTOMY    . CARDIAC CATHETERIZATION  07/2013   [facility] stent     Family History  Problem Relation Age of Onset  . Heart disease Mother   . Esophageal cancer Father   . 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 Types: Cigarettes  Smokeless Tobacco Never Used      Health Maintenance  Topic Date Due  . Hepatitis C Screening  Never done  . OPHTHALMOLOGY EXAM  Never done  . URINE MICROALBUMIN  Never done  . COVID-19 Vaccine (1) Never done  . TETANUS/TDAP  Never done  . COLONOSCOPY  Never done  . INFLUENZA VACCINE  Never done  . PNEUMOCOCCAL POLYSACCHARIDE VACCINE AGE 16-64 HIGH RISK  10/25/2020 (Originally 03/23/1971)  . HEMOGLOBIN A1C  04/21/2020  . FOOT EXAM  10/25/2020  . HIV Screening  Completed   Health Maintenance/Date Completed  Last ED visit: 08/20/2019 Last Visit to PCP: 9/9 Next  Visit to PCP: 9/23 Specialist Visit: Does not currently have appointment. Per patient, f/u visit will be one year from 11/12/2019, and office will call patient 6 months ahead to schedule. Patient reports that he needs to set up appointment with [facility], and plans to call them soon. Dental Exam: not recently Eye Exam: wears reading glasses but feels vision is fine; none recently Prostate Exam: ~2005 Colonoscopy: Appointment scheduled for 02/22/2020. Patient aware. Flu Vaccine: desires to receive flu vaccine from [facility] on 9/28. Pneumonia Vaccine: No. COVID-19 Vaccine: No.  Shingrix Vaccine: No.   Outpatient Encounter Medications as of 12/09/2019  Medication Sig  . aspirin EC 81 MG tablet Take 1 tablet (81 mg total) by mouth daily. Swallow whole.  . Marland Kitchentorvastatin (LIPITOR) 80 MG tablet Take 1 tablet (80 mg total) by mouth daily at 6 PM.  . blood glucose meter kit and supplies KIT Dispense based on patient and insurance preference. Use up to four times daily as directed. (FOR ICD-9 250.00, 250.01).  . canagliflozin (INVOKANA) 100 MG TABS tablet Take 1 tablet (100 mg total) by mouth daily before breakfast.  . carvedilol (COREG) 25 MG tablet Take 1 tablet (25 mg total) by mouth daily.  .Marland Kitchen  clopidogrel (PLAVIX) 75 MG tablet Take 1 tablet (75 mg total) by mouth daily.  Marland Kitchen gabapentin (NEURONTIN) 300 MG capsule Take 300 mg by mouth 3 (three) times daily. Take one capsule by mouth 3 times a day  . metFORMIN (GLUCOPHAGE) 1000 MG tablet Take 1 tablet (1,000 mg total) by mouth 2 (two) times daily with a meal.  . traZODone (DESYREL) 50 MG tablet Take 1 tablet (50 mg total) by mouth at bedtime as needed for sleep. (Patient not taking: Reported on 11/12/2019)   No facility-administered encounter medications on file as of 12/09/2019.     Assessment and Plan:   BP: Patient denies symptoms of orthostasis (lightheadedness). At last visit with [facility], losartan was discontinued and  carvedilol was decreased from twice daily to once daily. Patient is currently taking carvedilol in the morning, but is also still taking losartan 25 mg as needed. Patient is typically taking losartan 1-2 times / week, and takes a dose of losartan when he sees SBPs over 130. Patient is aware that [facility] discontinued losartan on September 9th. Currently taking blood pressure twice daily (8AM and 6PM). Asked patient to read his last 3 days worth of blood pressures, which is provided below. In response, advised patient to bring his blood pressure log to appointment at [facility] on 9/23.   Patient's blood pressure readings over past 3 days, reported via telephone: 12/09/2019 (Today): 123/80 (8AM) 12/08/2019 (Yesterday): 138/90 (8AM-afterwards, patient took losartan 25 mg 1 tab) and 110/73 (6PM) 12/07/2019: 128/82 (8AM), 134/81 (6PM) -Patient did not take losartan on 12/09/2019 or 12/07/2019.  Hyperlipidemia: Currently on atorvastatin 80 mg daily. TC = 126 mg/dl; TG = 175 mg/dl; HDL = 37 mg/dl; LDL = 60 g/dl (10/20/19)  CVA; 02/2019, 07/2019. On aspirin, clopidogrel..  CAD: 07/2013 NSTEMI/PCI. On aspirin, clopidogrel, carvedilol, atovastatin. ACE-inhibitor was discontinued due to low BP. However, patient is using losartan PRN when SBP > 130 mmHg.  Sleep: Patient had been taking trazodone in the past, however is not currently taking. Patient reports no longer needing trazodone after gabapentin was started on 11/04/2019. Patient reports that once he lays down in bed, he falls asleep quickly.  Last dose traozodone  ~8/12.    T2DM: Diabetes currently uncontrolled, A1c = 7.7%. Patient reports checking BG twice daily with FBG typically ~130-140. Encouraged to continue checking BG and report results to PCP at [facility] during appointment next week.   Health maintenance: Asked patient if he would like to receive his flu vaccine on September 28th. Patient is agreeable, but would require a ride to the pharmacy. Will follow up with  [employer] Bustard regarding transportation logistics. Patient expresses concern over a lump in his stomach. Encouraged patient to speak with his doctor about this during his next appointment at [facility] on September 23rd.      RTC: 1 year   Glean Salvo, Festus Aloe PharmD Student

## 2019-12-11 LAB — COMPREHENSIVE METABOLIC PANEL
ALT: 19 IU/L (ref 0–44)
AST: 16 IU/L (ref 0–40)
Albumin/Globulin Ratio: 2.6 — ABNORMAL HIGH (ref 1.2–2.2)
Albumin: 4.9 g/dL (ref 4.0–5.0)
Alkaline Phosphatase: 90 IU/L (ref 44–121)
BUN/Creatinine Ratio: 16 (ref 9–20)
BUN: 12 mg/dL (ref 6–24)
Bilirubin Total: 0.6 mg/dL (ref 0.0–1.2)
CO2: 24 mmol/L (ref 20–29)
Calcium: 9.9 mg/dL (ref 8.7–10.2)
Chloride: 102 mmol/L (ref 96–106)
Creatinine, Ser: 0.74 mg/dL — ABNORMAL LOW (ref 0.76–1.27)
GFR calc Af Amer: 124 mL/min/{1.73_m2} (ref >59)
GFR calc non Af Amer: 108 mL/min/{1.73_m2} (ref >59)
Globulin, Total: 1.9 g/dL (ref 1.5–4.5)
Glucose: 176 mg/dL — ABNORMAL HIGH (ref 65–99)
Potassium: 4.2 mmol/L (ref 3.5–5.2)
Sodium: 142 mmol/L (ref 134–144)
Total Protein: 6.8 g/dL (ref 6.0–8.5)

## 2019-12-11 LAB — CBC WITH DIFFERENTIAL/PLATELET
Basophils Absolute: 0.1 10*3/uL (ref 0.0–0.2)
Basos: 1 %
EOS (ABSOLUTE): 0.2 10*3/uL (ref 0.0–0.4)
Eos: 3 %
Hematocrit: 39.2 % (ref 37.5–51.0)
Hemoglobin: 13.4 g/dL (ref 13.0–17.7)
Immature Grans (Abs): 0 10*3/uL (ref 0.0–0.1)
Immature Granulocytes: 0 %
Lymphocytes Absolute: 2.7 10*3/uL (ref 0.7–3.1)
Lymphs: 33 %
MCH: 29.8 pg (ref 26.6–33.0)
MCHC: 34.2 g/dL (ref 31.5–35.7)
MCV: 87 fL (ref 79–97)
Monocytes Absolute: 0.5 10*3/uL (ref 0.1–0.9)
Monocytes: 6 %
Neutrophils Absolute: 4.7 10*3/uL (ref 1.4–7.0)
Neutrophils: 57 %
Platelets: 287 10*3/uL (ref 150–450)
RBC: 4.5 x10E6/uL (ref 4.14–5.80)
RDW: 12.8 % (ref 11.6–15.4)
WBC: 8.3 10*3/uL (ref 3.4–10.8)

## 2019-12-11 LAB — URINALYSIS, ROUTINE W REFLEX MICROSCOPIC

## 2019-12-11 LAB — LIPASE: Lipase: 39 U/L (ref 13–78)

## 2019-12-16 ENCOUNTER — Other Ambulatory Visit: Payer: Self-pay

## 2019-12-16 ENCOUNTER — Ambulatory Visit: Payer: Medicaid Other | Admitting: Gerontology

## 2019-12-16 ENCOUNTER — Encounter: Payer: Self-pay | Admitting: Gerontology

## 2019-12-16 VITALS — BP 107/76 | HR 75 | Resp 16 | Ht 66.0 in | Wt 162.5 lb

## 2019-12-16 DIAGNOSIS — R1011 Right upper quadrant pain: Secondary | ICD-10-CM

## 2019-12-16 DIAGNOSIS — E1140 Type 2 diabetes mellitus with diabetic neuropathy, unspecified: Secondary | ICD-10-CM

## 2019-12-16 DIAGNOSIS — I10 Essential (primary) hypertension: Secondary | ICD-10-CM

## 2019-12-16 MED ORDER — GABAPENTIN 300 MG PO CAPS: 300.0000 mg | ORAL_CAPSULE | Freq: Three times a day (TID) | ORAL | 2 refills | Status: DC

## 2019-12-16 MED ORDER — CARVEDILOL 25 MG PO TABS: 25.0000 mg | ORAL_TABLET | Freq: Two times a day (BID) | ORAL | 2 refills | Status: DC

## 2019-12-16 NOTE — Telephone Encounter (Signed)
Called patient to follow up and see if he ever made it to [place] and began attending a local [place] support group.  Left a message asking him to call the clinic and provided the phone number.

## 2019-12-16 NOTE — Progress Notes (Signed)
Established Patient Office Visit  Subjective:  Patient ID: Christian [name], male    DOB: Feb 18, 1971  Age: 49 y.o. MRN: [id number]  CC:  Chief Complaint  Patient presents with  . Diabetes    HPI Christian [name] presents for follow up of his type 2 diabetes mellitus, hypertension and pain in right upper abdominal area. Patient checks his blood pressure twice daily and brought his log to appointment. His blood pressure ranges from 120-138/75-90 according to his log. He reports compliance with his blood pressure medication. He denies dizziness or lightheadedness. His A1C done on 7/28 was 7.7%. Patient also logs his blood glucose twice daily with no readings over 250 since 8/29. He reports compliance with his medications and making healthy lifestyle modifications. Denies any hypoglycemia episodes. He reports that he continues to experience right upper quadrant abdominal pain that he has been experiencing for a month now. He states that the pain is constant. Pain radiated from the RUQ to his back. He describes the pain as dull. He denies aggravating factors. He rates the pain as 2/10 when it occurs. He denies vomiting, nausea, diarrhea, fevers, or appetite changes.  Reports an appointment with GI on 02/22/20 for this issue. Reports that he is doing well overall and has no other complaints.   Past Medical History:  Diagnosis Date  . CAD (coronary artery disease)    a. 07/2013 NSTEMI/PCI: LCX 62m(4.0x23 Xience DES), RCA 413mEF > 55%;  b. 07/2013 Echo: EF 60-65%, diast dysfxn, mild LVH, mildly dil LA, nl RV size/fxn, nl RVSP.  . [place] without complication ([facility]  . History of tobacco abuse    a. quit ~ 2010  . Hyperlipidemia   . [place]   a. weighed 168 @ age 89 . Scoliosis   [facility]    Past Surgical History:  Procedure Laterality Date  . ADENOIDECTOMY    . CARDIAC CATHETERIZATION  07/2013   [facility] stent    Family History  Problem Relation Age of Onset  .  Heart disease Mother   . Esophageal cancer Father   . Diabetes Sister     Social History   Socioeconomic History  . Marital status: Single    Spouse name: Not on file  . Number of children: Not on file  . Years of education: Not on file  . Highest education level: Not on file  Occupational History  . Not on file  Tobacco Use  . Smoking status: Former Smoker    Types: Cigarettes  . Smokeless tobacco: Never Used  Vaping Use  . Vaping Use: Never used  Substance and Sexual Activity  . Alcohol use: Yes    Comment: Drink half a bottle of vodka last Thursday.  . Drug use: No    Types: Marijuana    Comment: past  . Sexual activity: Not on file  Other Topics Concern  . Not on file  Social History Narrative  . Not on file   Social Determinants of Health   Financial Resource Strain:   . Difficulty of Paying Living Expenses: Not on file  Food Insecurity:   . Worried About RuCharity fundraisern the Last Year: Not on file  . Ran Out of Food in the Last Year: Not on file  Transportation Needs:   . Lack of Transportation (Medical): Not on file  . Lack of Transportation (Non-Medical): Not on file  Physical Activity:   . Days  of Exercise per Week: Not on file  . Minutes of Exercise per Session: Not on file  Stress:   . Feeling of Stress : Not on file  Social Connections:   . Frequency of Communication with Friends and Family: Not on file  . Frequency of Social Gatherings with Friends and Family: Not on file  . Attends Religious Services: Not on file  . Active Member of Clubs or Organizations: Not on file  . Attends Archivist Meetings: Not on file  . Marital Status: Not on file  Intimate Partner Violence:   . Fear of Current or Ex-Partner: Not on file  . Emotionally Abused: Not on file  . Physically Abused: Not on file  . Sexually Abused: Not on file    Outpatient Medications Prior to Visit  Medication Sig Dispense Refill  . aspirin EC 81 MG tablet Take 1  tablet (81 mg total) by mouth daily. Swallow whole. 90 tablet 3  . blood glucose meter kit and supplies KIT Dispense based on patient and insurance preference. Use up to four times daily as directed. (FOR ICD-9 250.00, 250.01). 1 each 0  . canagliflozin (INVOKANA) 100 MG TABS tablet Take 1 tablet (100 mg total) by mouth daily before breakfast. 30 tablet 2  . clopidogrel (PLAVIX) 75 MG tablet Take 1 tablet (75 mg total) by mouth daily. 90 tablet 3  . metFORMIN (GLUCOPHAGE) 1000 MG tablet Take 1 tablet (1,000 mg total) by mouth 2 (two) times daily with a meal. 60 tablet 2  . carvedilol (COREG) 25 MG tablet Take 1 tablet (25 mg total) by mouth daily. 30 tablet 2  . gabapentin (NEURONTIN) 300 MG capsule Take 300 mg by mouth 3 (three) times daily. Take one capsule by mouth 3 times a day    . atorvastatin (LIPITOR) 80 MG tablet Take 1 tablet (80 mg total) by mouth daily at 6 PM. 30 tablet 2  . traZODone (DESYREL) 50 MG tablet Take 1 tablet (50 mg total) by mouth at bedtime as needed for sleep. (Patient not taking: Reported on 11/12/2019) 30 tablet 1   No facility-administered medications prior to visit.    No Known Allergies  ROS Review of Systems  Constitutional: Negative.   Respiratory: Negative.   Cardiovascular: Negative.   Gastrointestinal: Positive for abdominal pain.       RUQ feels "bigger."      Objective:    Physical Exam Constitutional:      Appearance: Normal appearance.  Cardiovascular:     Rate and Rhythm: Normal rate and regular rhythm.     Heart sounds: Normal heart sounds.  Pulmonary:     Effort: Pulmonary effort is normal.     Breath sounds: Normal breath sounds.  Abdominal:     General: Abdomen is flat. Bowel sounds are normal.     Palpations: Abdomen is soft.  Neurological:     Mental Status: He is alert.     BP 107/76 (BP Location: Left Arm, Patient Position: Sitting)   Pulse 75   Resp 16   Ht _0  (1.676 m)   Wt 162 lb 8 oz (73.7 kg)   SpO2 97%   BMI  26.23 kg/m  Wt Readings from Last 3 Encounters:  12/16/19 162 lb 8 oz (73.7 kg)  12/02/19 158 lb 3.2 oz (71.8 kg)  11/12/19 158 lb (71.7 kg)     Health Maintenance Due  Topic Date Due  . Hepatitis C Screening  Never done  . OPHTHALMOLOGY  EXAM  Never done  . URINE MICROALBUMIN  Never done  . COVID-19 Vaccine (1) Never done  . TETANUS/TDAP  Never done  . COLONOSCOPY  Never done  . INFLUENZA VACCINE  Never done    There are no preventive care reminders to display for this patient.  Lab Results  Component Value Date   TSH 2.720 04/22/2019   Lab Results  Component Value Date   WBC 8.3 12/08/2019   HGB 13.4 12/08/2019   HCT 39.2 12/08/2019   MCV 87 12/08/2019   PLT 287 12/08/2019   Lab Results  Component Value Date   NA 142 12/08/2019   K 4.2 12/08/2019   CO2 24 12/08/2019   GLUCOSE 176 (H) 12/08/2019   BUN 12 12/08/2019   CREATININE 0.74 (L) 12/08/2019   BILITOT 0.6 12/08/2019   ALKPHOS 90 12/08/2019   AST 16 12/08/2019   ALT 19 12/08/2019   PROT 6.8 12/08/2019   ALBUMIN 4.9 12/08/2019   CALCIUM 9.9 12/08/2019   ANIONGAP 11 08/23/2019   Lab Results  Component Value Date   CHOL 126 10/20/2019   Lab Results  Component Value Date   HDL 37 (L) 10/20/2019   Lab Results  Component Value Date   LDLCALC 60 10/20/2019   Lab Results  Component Value Date   TRIG 175 (H) 10/20/2019   Lab Results  Component Value Date   CHOLHDL 3.4 10/20/2019   Lab Results  Component Value Date   HGBA1C 7.7 (H) 10/20/2019      Assessment & Plan:   1. Essential hypertension -Do not take intermittently Losartan. Discontinue Losartan completely and resume carvedilol TWICE daily with a meal.  -Continue logging blood pressure twice daily and following the DASH diet.  -carvedilol (COREG) 25 MG tablet; Take 1 tablet (25 mg total) by mouth 2 (two) times daily with a meal.  Dispense: 60 tablet; Refill: 2  2. Type 2 diabetes mellitus with diabetic neuropathy, without long-term  current use of insulin (HCC) -His A1C was 7.7% and his goal should be under 7%. Fasting blood glucose should be between 70-148m/dL. Continue your medications as prescribed. Continue checking and logging your blood sugar twice daily. Continue your low carb and low concentrated sugar diet and lifestyle modifications.   -gabapentin (NEURONTIN) 300 MG capsule; Take 1 capsule (300 mg total) by mouth 3 (three) times daily. Take one capsule by mouth 3 times a day  Dispense: 90 capsule; Refill: 2 - HgB A1c; Future - Urine Microalbumin w/creat. ratio; Future  3. Abdominal pain, acute, right upper quadrant -Follow up with GI as scheduled on 02/22/20.  Patient was advised to go to the nearest emergency room with any worsening or new abdominal symptoms and verbalized understanding.    Follow-up: Today was patieny's last visit because he now has Medicaid.    JMarina Gravel Student-NP

## 2019-12-20 ENCOUNTER — Telehealth: Payer: Self-pay | Admitting: Pharmacy Technician

## 2019-12-20 NOTE — Telephone Encounter (Signed)
Spoke to patient about arranging transportation to [facility] for flu clinic.  Patient stated that he no longer needed me to arrange transportation.  Patient stated that he now has Medicaid.  Wants prescriptions transferred to [place], [place].  Prescriptions to be transferred by pharmacy technician.  [facility]

## 2019-12-21 NOTE — Telephone Encounter (Signed)
Called patient to follow up on his progress with attending an [place] meeting or making an appointment at [facility].  He did identify an AA group meeting that he had attended before and would be interested in going back to.  He mentioned that getting a way to it is a challenge.  Patient has not yet gone to [facility] or been able to reach them on the telephone.  He said that he planned to take the bus there today, but woke up late.  He is planning to try to go tomorrow.  This was the fourth follow-up call for this referral.

## 2020-02-22 ENCOUNTER — Encounter: Payer: Self-pay | Admitting: Gastroenterology

## 2020-02-22 ENCOUNTER — Other Ambulatory Visit: Payer: Self-pay

## 2020-02-22 ENCOUNTER — Ambulatory Visit (INDEPENDENT_AMBULATORY_CARE_PROVIDER_SITE_OTHER): Payer: Medicaid Other | Admitting: Gastroenterology

## 2020-02-22 VITALS — BP 176/96 | HR 81 | Temp 98.1°F | Ht 66.0 in | Wt 173.1 lb

## 2020-02-22 DIAGNOSIS — G8929 Other chronic pain: Secondary | ICD-10-CM | POA: Diagnosis not present

## 2020-02-22 DIAGNOSIS — R1011 Right upper quadrant pain: Secondary | ICD-10-CM

## 2020-02-22 DIAGNOSIS — Z1211 Encounter for screening for malignant neoplasm of colon: Secondary | ICD-10-CM | POA: Diagnosis not present

## 2020-02-22 MED ORDER — NA SULFATE-K SULFATE-MG SULF 17.5-3.13-1.6 GM/177ML PO SOLN: 354.0000 mL | Freq: Once | ORAL | 0 refills | Status: AC

## 2020-02-22 NOTE — Patient Instructions (Addendum)
Please find a Primary care physician before your next appointment with our office. [place] can help you find one by calling [phone]   Please get omeprazole 20mg  twice a day for 2 weeks then stop
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
## 2020-02-29 ENCOUNTER — Telehealth: Payer: Self-pay | Admitting: Gastroenterology

## 2020-02-29 NOTE — Telephone Encounter (Signed)
Pt called to cancel procedure

## 2020-02-29 NOTE — Telephone Encounter (Signed)
Patient states he does not want to rescheduled the procedure. He wants to cancel. Called Trish and cancel the appointment.

## 2020-03-07 ENCOUNTER — Encounter: Admission: RE | Payer: Self-pay | Source: Home / Self Care

## 2020-03-07 ENCOUNTER — Ambulatory Visit: Admission: RE | Admit: 2020-03-07 | Payer: Medicaid Other | Source: Home / Self Care | Admitting: Gastroenterology

## 2020-03-07 SURGERY — COLONOSCOPY WITH PROPOFOL
Anesthesia: General

## 2020-12-12 ENCOUNTER — Other Ambulatory Visit: Payer: Self-pay

## 2020-12-12 ENCOUNTER — Emergency Department: Payer: Medicaid Other

## 2020-12-12 ENCOUNTER — Emergency Department
Admission: EM | Admit: 2020-12-12 | Discharge: 2020-12-12 | Disposition: A | Discharge status: Home / Self Care | Payer: Medicaid Other | Attending: Emergency Medicine | Admitting: Emergency Medicine

## 2020-12-12 DIAGNOSIS — Z7984 Long term (current) use of oral hypoglycemic drugs: Secondary | ICD-10-CM | POA: Insufficient documentation

## 2020-12-12 DIAGNOSIS — I2510 Atherosclerotic heart disease of native coronary artery without angina pectoris: Secondary | ICD-10-CM | POA: Diagnosis not present

## 2020-12-12 DIAGNOSIS — M79604 Pain in right leg: Secondary | ICD-10-CM | POA: Diagnosis present

## 2020-12-12 DIAGNOSIS — E785 Hyperlipidemia, unspecified: Secondary | ICD-10-CM | POA: Diagnosis not present

## 2020-12-12 DIAGNOSIS — Z87891 Personal history of nicotine dependence: Secondary | ICD-10-CM | POA: Diagnosis not present

## 2020-12-12 DIAGNOSIS — E1169 Type 2 diabetes mellitus with other specified complication: Secondary | ICD-10-CM | POA: Insufficient documentation

## 2020-12-12 DIAGNOSIS — I82411 Acute embolism and thrombosis of right femoral vein: Secondary | ICD-10-CM | POA: Diagnosis not present

## 2020-12-12 DIAGNOSIS — I10 Essential (primary) hypertension: Secondary | ICD-10-CM | POA: Insufficient documentation

## 2020-12-12 DIAGNOSIS — E119 Type 2 diabetes mellitus without complications: Secondary | ICD-10-CM

## 2020-12-12 LAB — BASIC METABOLIC PANEL
Anion gap: 14 (ref 5–15)
BUN: 12 mg/dL (ref 6–20)
CO2: 26 mmol/L (ref 22–32)
Calcium: 9.1 mg/dL (ref 8.9–10.3)
Chloride: 95 mmol/L — ABNORMAL LOW (ref 98–111)
Creatinine, Ser: 0.81 mg/dL (ref 0.61–1.24)
GFR, Estimated: >60 mL/min (ref >60)
Glucose, Bld: 357 mg/dL — ABNORMAL HIGH (ref 70–99)
Potassium: 3.5 mmol/L (ref 3.5–5.1)
Sodium: 135 mmol/L (ref 135–145)

## 2020-12-12 LAB — CBC
HCT: 46.2 % (ref 39.0–52.0)
Hemoglobin: 16.7 g/dL (ref 13.0–17.0)
MCH: 32.1 pg (ref 26.0–34.0)
MCHC: 36.1 g/dL — ABNORMAL HIGH (ref 30.0–36.0)
MCV: 88.7 fL (ref 80.0–100.0)
Platelets: 172 10*3/uL (ref 150–400)
RBC: 5.21 MIL/uL (ref 4.22–5.81)
RDW: 14.8 % (ref 11.5–15.5)
WBC: 9.6 10*3/uL (ref 4.0–10.5)
nRBC: 0 % (ref 0.0–0.2)

## 2020-12-12 LAB — TROPONIN I (HIGH SENSITIVITY)
Troponin I (High Sensitivity): 14 ng/L (ref <18)
Troponin I (High Sensitivity): 15 ng/L (ref <18)

## 2020-12-12 MED ORDER — APIXABAN 5 MG PO TABS
ORAL_TABLET | ORAL | 0 refills | Status: DC
Start: 2020-12-12 — End: 2020-12-21
  Filled 2020-12-12: qty 74, 30d supply, fill #0

## 2020-12-12 MED ORDER — APIXABAN 5 MG PO TABS
10.0000 mg | ORAL_TABLET | ORAL | Status: AC
  Administered 2020-12-12: 10 mg via ORAL
  Filled 2020-12-12: qty 2

## 2020-12-12 MED ORDER — CARVEDILOL 25 MG PO TABS
25.0000 mg | ORAL_TABLET | Freq: Two times a day (BID) | ORAL | 1 refills | Status: DC
  Filled 2020-12-12: qty 60, 30d supply, fill #0

## 2020-12-12 MED ORDER — IOHEXOL 350 MG/ML SOLN
75.0000 mL | Freq: Once | INTRAVENOUS | Status: AC | PRN
  Administered 2020-12-12: 75 mL via INTRAVENOUS
  Filled 2020-12-12: qty 75

## 2020-12-12 MED ORDER — METFORMIN HCL 1000 MG PO TABS
1000.0000 mg | ORAL_TABLET | Freq: Two times a day (BID) | ORAL | 1 refills | Status: DC
  Filled 2020-12-12: qty 20, 10d supply, fill #0

## 2020-12-12 NOTE — Discharge Instructions (Addendum)
Your test today show that you have a blood clot in your leg.  Please refill your medication to take care of your diabetes and high blood pressure.  He will also need to start taking Eliquis as prescribed to help the blood clot go away.  Return to the emergency room if you have severe chest pain or shortness of breath.

## 2020-12-12 NOTE — ED Triage Notes (Addendum)
Pt comes with c/o right leg pain and swelling. Pt states this started  Monday morning.   Pt states HTN, diabetes and CHF. Pt states he doesn't take any meds for these things.  Pt denies any CP or SOb.

## 2020-12-12 NOTE — ED Provider Notes (Signed)
Procedures     ----------------------------------------- 5:20 PM on 12/12/2020 -----------------------------------------  CT angiogram negative for acute occlusive PE.  There is 1 small nonocclusive thrombus, which I think is not acute given lack of chest symptoms currently.  Patient reports that his shortness of breath is gone and he has no chest pain.  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  Also received refills of his Coreg and metformin.  He is currently asymptomatic except for some right leg pain, stable for discharge without further work-up.   Repeat EKG shows sinus tachycardia rate 102.  Right axis, normal QRS ST segments and T waves.  There are some inferior Q waves which are old compared to previous EKG November 12, 2019   Carrie Mew, MD 12/12/20 [phone]

## 2020-12-12 NOTE — ED Notes (Signed)
Pt return from CT.
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
## 2020-12-12 NOTE — ED Notes (Signed)
Patient transported to CT 

## 2020-12-13 ENCOUNTER — Other Ambulatory Visit: Payer: Self-pay

## 2020-12-14 ENCOUNTER — Other Ambulatory Visit: Payer: Self-pay

## 2020-12-14 ENCOUNTER — Emergency Department (EMERGENCY_DEPARTMENT_HOSPITAL)
Admission: EM | Admit: 2020-12-14 | Discharge: 2020-12-14 | Disposition: A | Discharge status: Other Acute Inpatient Hospital | Payer: No Typology Code available for payment source | Source: Home / Self Care | Attending: Student in an Organized Health Care Education/Training Program | Admitting: Student in an Organized Health Care Education/Training Program

## 2020-12-14 ENCOUNTER — Inpatient Hospital Stay
Admission: AD | Admit: 2020-12-14 | Discharge: 2020-12-21 | DRG: 885 | Disposition: A | Discharge status: Home / Self Care | Payer: No Typology Code available for payment source | Source: Intra-hospital | Attending: Behavioral Health | Admitting: Behavioral Health

## 2020-12-14 ENCOUNTER — Encounter: Payer: Self-pay | Admitting: Psychiatry

## 2020-12-14 DIAGNOSIS — I2510 Atherosclerotic heart disease of native coronary artery without angina pectoris: Secondary | ICD-10-CM | POA: Diagnosis present

## 2020-12-14 DIAGNOSIS — E119 Type 2 diabetes mellitus without complications: Secondary | ICD-10-CM | POA: Diagnosis present

## 2020-12-14 DIAGNOSIS — I10 Essential (primary) hypertension: Secondary | ICD-10-CM | POA: Diagnosis present

## 2020-12-14 DIAGNOSIS — Z87891 Personal history of nicotine dependence: Secondary | ICD-10-CM | POA: Insufficient documentation

## 2020-12-14 DIAGNOSIS — F329 Major depressive disorder, single episode, unspecified: Secondary | ICD-10-CM | POA: Diagnosis present

## 2020-12-14 DIAGNOSIS — Z20822 Contact with and (suspected) exposure to covid-19: Secondary | ICD-10-CM | POA: Diagnosis present

## 2020-12-14 DIAGNOSIS — Z7984 Long term (current) use of oral hypoglycemic drugs: Secondary | ICD-10-CM | POA: Diagnosis not present

## 2020-12-14 DIAGNOSIS — R45851 Suicidal ideations: Secondary | ICD-10-CM

## 2020-12-14 DIAGNOSIS — F332 Major depressive disorder, recurrent severe without psychotic features: Secondary | ICD-10-CM | POA: Insufficient documentation

## 2020-12-14 DIAGNOSIS — Z79899 Other long term (current) drug therapy: Secondary | ICD-10-CM | POA: Insufficient documentation

## 2020-12-14 DIAGNOSIS — Z8673 Personal history of transient ischemic attack (TIA), and cerebral infarction without residual deficits: Secondary | ICD-10-CM | POA: Diagnosis not present

## 2020-12-14 DIAGNOSIS — F339 Major depressive disorder, recurrent, unspecified: Secondary | ICD-10-CM | POA: Diagnosis not present

## 2020-12-14 DIAGNOSIS — G4700 Insomnia, unspecified: Secondary | ICD-10-CM | POA: Diagnosis present

## 2020-12-14 DIAGNOSIS — F32A Depression, unspecified: Secondary | ICD-10-CM

## 2020-12-14 DIAGNOSIS — F1020 Alcohol dependence, uncomplicated: Secondary | ICD-10-CM | POA: Diagnosis present

## 2020-12-14 DIAGNOSIS — Z9151 Personal history of suicidal behavior: Secondary | ICD-10-CM | POA: Diagnosis not present

## 2020-12-14 DIAGNOSIS — Z7901 Long term (current) use of anticoagulants: Secondary | ICD-10-CM

## 2020-12-14 DIAGNOSIS — E1140 Type 2 diabetes mellitus with diabetic neuropathy, unspecified: Secondary | ICD-10-CM | POA: Insufficient documentation

## 2020-12-14 DIAGNOSIS — E785 Hyperlipidemia, unspecified: Secondary | ICD-10-CM | POA: Diagnosis present

## 2020-12-14 DIAGNOSIS — I82409 Acute embolism and thrombosis of unspecified deep veins of unspecified lower extremity: Secondary | ICD-10-CM | POA: Diagnosis present

## 2020-12-14 LAB — COMPREHENSIVE METABOLIC PANEL
ALT: 11 U/L (ref 0–44)
AST: 18 U/L (ref 15–41)
Albumin: 4.3 g/dL (ref 3.5–5.0)
Alkaline Phosphatase: 108 U/L (ref 38–126)
Anion gap: 13 (ref 5–15)
BUN: 15 mg/dL (ref 6–20)
CO2: 27 mmol/L (ref 22–32)
Calcium: 9.3 mg/dL (ref 8.9–10.3)
Chloride: 95 mmol/L — ABNORMAL LOW (ref 98–111)
Creatinine, Ser: 0.83 mg/dL (ref 0.61–1.24)
GFR, Estimated: >60 mL/min (ref >60)
Glucose, Bld: 270 mg/dL — ABNORMAL HIGH (ref 70–99)
Potassium: 3.4 mmol/L — ABNORMAL LOW (ref 3.5–5.1)
Sodium: 135 mmol/L (ref 135–145)
Total Bilirubin: 2.5 mg/dL — ABNORMAL HIGH (ref 0.3–1.2)
Total Protein: 7.9 g/dL (ref 6.5–8.1)

## 2020-12-14 LAB — CBC
HCT: 43.5 % (ref 39.0–52.0)
Hemoglobin: 15.9 g/dL (ref 13.0–17.0)
MCH: 32.4 pg (ref 26.0–34.0)
MCHC: 36.6 g/dL — ABNORMAL HIGH (ref 30.0–36.0)
MCV: 88.8 fL (ref 80.0–100.0)
Platelets: 145 10*3/uL — ABNORMAL LOW (ref 150–400)
RBC: 4.9 MIL/uL (ref 4.22–5.81)
RDW: 15 % (ref 11.5–15.5)
WBC: 9 10*3/uL (ref 4.0–10.5)
nRBC: 0 % (ref 0.0–0.2)

## 2020-12-14 LAB — RESP PANEL BY RT-PCR (FLU A&B, COVID) ARPGX2
Influenza A by PCR: NEGATIVE
Influenza B by PCR: NEGATIVE
SARS Coronavirus 2 by RT PCR: NEGATIVE

## 2020-12-14 LAB — URINE DRUG SCREEN, QUALITATIVE (ARMC ONLY)
Amphetamines, Ur Screen: NOT DETECTED
Barbiturates, Ur Screen: NOT DETECTED
Benzodiazepine, Ur Scrn: NOT DETECTED
Cannabinoid 50 Ng, Ur ~~LOC~~: NOT DETECTED
Cocaine Metabolite,Ur ~~LOC~~: NOT DETECTED
MDMA (Ecstasy)Ur Screen: NOT DETECTED
Methadone Scn, Ur: NOT DETECTED
Opiate, Ur Screen: NOT DETECTED
Phencyclidine (PCP) Ur S: NOT DETECTED
Tricyclic, Ur Screen: NOT DETECTED

## 2020-12-14 LAB — ETHANOL: Alcohol, Ethyl (B): <10 mg/dL (ref <10)

## 2020-12-14 LAB — ACETAMINOPHEN LEVEL: Acetaminophen (Tylenol), Serum: <10 ug/mL — ABNORMAL LOW (ref 10–30)

## 2020-12-14 LAB — SALICYLATE LEVEL: Salicylate Lvl: <7 mg/dL — ABNORMAL LOW (ref 7.0–30.0)

## 2020-12-14 MED ORDER — MAGNESIUM HYDROXIDE 400 MG/5ML PO SUSP: 30.0000 mL | Freq: Every day | ORAL | Status: DC | PRN

## 2020-12-14 MED ORDER — ACETAMINOPHEN 325 MG PO TABS: 650.0000 mg | ORAL_TABLET | Freq: Four times a day (QID) | ORAL | Status: DC | PRN

## 2020-12-14 MED ORDER — ALUM & MAG HYDROXIDE-SIMETH 200-200-20 MG/5ML PO SUSP: 30.0000 mL | ORAL | Status: DC | PRN

## 2020-12-14 MED ORDER — APIXABAN 5 MG PO TABS
10.0000 mg | ORAL_TABLET | Freq: Two times a day (BID) | ORAL | Status: AC
  Administered 2020-12-15 – 2020-12-20 (×12): 10 mg via ORAL
  Filled 2020-12-14 (×13): qty 2

## 2020-12-14 MED ORDER — CARVEDILOL 25 MG PO TABS
25.0000 mg | ORAL_TABLET | Freq: Two times a day (BID) | ORAL | Status: DC
  Administered 2020-12-14: 25 mg via ORAL
  Filled 2020-12-14: qty 1

## 2020-12-14 MED ORDER — APIXABAN 5 MG PO TABS
5.0000 mg | ORAL_TABLET | Freq: Two times a day (BID) | ORAL | Status: DC
  Administered 2020-12-21: 5 mg via ORAL
  Filled 2020-12-14 (×3): qty 1

## 2020-12-14 MED ORDER — METFORMIN HCL 500 MG PO TABS
1000.0000 mg | ORAL_TABLET | Freq: Two times a day (BID) | ORAL | Status: DC
  Administered 2020-12-14: 1000 mg via ORAL
  Filled 2020-12-14: qty 2

## 2020-12-14 MED ORDER — APIXABAN 5 MG PO TABS
10.0000 mg | ORAL_TABLET | Freq: Two times a day (BID) | ORAL | Status: DC
  Administered 2020-12-14: 10 mg via ORAL
  Filled 2020-12-14: qty 2

## 2020-12-14 MED ORDER — CARVEDILOL 12.5 MG PO TABS
25.0000 mg | ORAL_TABLET | Freq: Two times a day (BID) | ORAL | Status: DC
  Administered 2020-12-15 – 2020-12-21 (×13): 25 mg via ORAL
  Filled 2020-12-14 (×14): qty 2

## 2020-12-14 MED ORDER — APIXABAN 5 MG PO TABS: 5.0000 mg | ORAL_TABLET | Freq: Two times a day (BID) | ORAL | Status: DC

## 2020-12-14 MED ORDER — METFORMIN HCL 500 MG PO TABS
1000.0000 mg | ORAL_TABLET | Freq: Two times a day (BID) | ORAL | Status: DC
  Administered 2020-12-15 – 2020-12-21 (×13): 1000 mg via ORAL
  Filled 2020-12-14 (×13): qty 2

## 2020-12-14 NOTE — ED Triage Notes (Signed)
Pt to ER via POV with complaints of suicidal ideation. Reports he has been feeling depressed since his mother passed away last year, he lost his job due to [employer], and has had two strokes. Reports poor relationship with son and daughter. Has also been unable to afford his medications.   Reports having a poor appetite since Sunday. Denies plan but states he had a "dream where someone had a gun, and I thought about suicide by cop".

## 2020-12-14 NOTE — ED Notes (Signed)
IVC 

## 2020-12-14 NOTE — BH Assessment (Signed)
Patient is to be admitted to [facility] by Psychiatric Nurse Practitioner  Barbaraann Share, B .  Attending Physician will be Dr. Domingo Cocking.   Patient has been assigned to room 306, by [facility] Charge Nurse [place].   Intake Paper Work has been signed and placed on patient chart.  ER staff is aware of the admission: Vaughan Basta, ER Secretary   Dr. Tamala Julian, ER MD  Deneise Lever Patient's Nurse  Helene Kelp, Patient Access.   Pt can be transported on 12/14/20.

## 2020-12-14 NOTE — ED Notes (Signed)
Given lunch tray.

## 2020-12-14 NOTE — ED Notes (Signed)
Pt dressed out by this RN and Johnney Ou, pt belongings include: Camo back bag Black shoes Blue jeans Black belt Yellow boxers Leisure centre manager with [employer]/ 2 dog tags Black face mask

## 2020-12-14 NOTE — Progress Notes (Signed)
Patient admitted from [facility] - ED, report received from Amy, RN. Patient calm and cooperative during assessment denies HI/AVH. Patient endorses passive SI, but stated he wasn't feeling it at this moment. Pt stated he would let staff know if he felt like hurting himself. Patient endorses depression stating it has only gotten worse since his mother passed away. Patient oriented to the unit and his room. Pt stated he was tired and wanted to get some sleep. Pt given education, support, and encouragement to be active in his treatment plan. Pt being monitored Q 15 minutes for safety per unit protocol. Pt remains safe on the unit. Skin assessment completed with Britt Bolognese, RN. Pt has bilateral tattoos on shoulders.

## 2020-12-14 NOTE — ED Notes (Signed)
Pt. Got dinner tray and drink.

## 2020-12-14 NOTE — Tx Team (Signed)
Initial Treatment Plan 12/14/2020 9:48 PM [name] UEK:[id number]    PATIENT STRESSORS: Loss of Mother   Substance abuse     PATIENT STRENGTHS: Ability for insight  Motivation for treatment/growth    PATIENT IDENTIFIED PROBLEMS: Depression  Suicidal Ideation                   DISCHARGE CRITERIA:  Improved stabilization in mood, thinking, and/or behavior Verbal commitment to aftercare and medication compliance  PRELIMINARY DISCHARGE PLAN: Outpatient therapy Return to previous living arrangement  PATIENT/FAMILY INVOLVEMENT: This treatment plan has been presented to and reviewed with the patient, Christian [name]. The patient has been given the opportunity to ask questions and make suggestions.  Mallie Darting, RN 12/14/2020, 9:48 PM
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
## 2020-12-14 NOTE — Consult Note (Signed)
[facility] Consult   Reason for Consult:  consult for suicidal ideation Referring Physician:  Quentin Cornwall Patient Identification: Christian [name] MRN:  [id number] Principal Diagnosis: Suicidal ideation Diagnosis:  Principal Problem:   Suicidal ideation Active Problems:   MDD (major depressive disorder)   Total Time spent with patient: 1 hour  Subjective: "I do not feel like living anymore." Christian [name] is a 50 y.o. male patient admitted with worsening depression and suicidal ideation.    HPI: Patient seen and chart reviewed.  Patient is pleasant and appropriate, stated depression is congruent with affect.  He states that he came to the hospital a few days ago and had a blood clot in his right leg.  They prescribed Eliquis, but he did not fill it because he said he did not have any running.  Patient seems confused, as he does say that he does have Medicaid.  Writer told patient that Medicaid will pay for that.  Patient states that he has been very depressed since his mom died a year and a half ago.  He said his siblings disowned him even though he is the one who took care of her in her house.  Patient's son bought his mother's house and patient still lives there with son and his son's wife and their 56-year-old daughter.  Patient is not happy there as he believes that they do not care for him.  He stays in his room all the time.  He has a poor relationship with his son and his daughter-in-law and does not have much to do with him either.  He states that he does not have many friends.  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  Patient states that he feels that he is going to definitely give up and does not take any of his needed medications.  He endorses suicidal ideations in this manner.  He also states that he could just find pills to take like he did last time, as he feels there is no reason to live and no one cares.   Patient also endorses auditory hallucinations, stating that he hears mumbling voices coming from the fish tank and from outside when he is in his room.  He states that sometimes he gets so frustrated he has thoughts of shooting his son and granddaughter.  Patient denies current use of alcohol, or any other illicit drugs.  BAL is less than 10.  Patient's vital signs and glucose need to be monitored in ED and be more stabilized prior to consideration for psychiatric admission.    Past Psychiatric History of depression and alcohol abuse: Prior hospitalizations here are: January 27- April 27, 2019; November 13-18, 2020; March 6-12, 2020; history of alcohol abuse  Risk to Self:   Risk to Others:   Prior Inpatient Therapy:   Prior Outpatient Therapy:    Past Medical History:  Past Medical History:  Diagnosis Date   CAD (coronary artery disease)    a. 07/2013 NSTEMI/PCI: LCX 36m (4.0x23 Xience DES), RCA 79m, EF > 55%;  b. 07/2013 Echo: EF 60-65%, diast dysfxn, mild LVH, mildly dil LA, nl RV size/fxn, nl RVSP.   Diabetes mellitus without complication ([place])    History of tobacco abuse    a. quit ~ 2010   Hyperlipidemia    Morbid obesity ([place])    a. weighed 168 @ age 51.   Scoliosis  Stroke [facility])     Past Surgical History:  Procedure Laterality Date   ADENOIDECTOMY     CARDIAC CATHETERIZATION  07/2013   [facility]'x1 stent   Family History:  Family History  Problem Relation Age of Onset   Heart disease Mother    Esophageal cancer Father    Diabetes Sister    Family Psychiatric  History: None reported Social History:  Social History   Substance and Sexual Activity  Alcohol Use Yes   Comment: Drink half a bottle of vodka last Thursday.     Social History   Substance and Sexual Activity  Drug Use No   Types: Marijuana   Comment: past    Social History   Socioeconomic History   Marital status: Single    Spouse name: Not on file   Number of children: Not on file   Years of  education: Not on file   Highest education level: Not on file  Occupational History   Not on file  Tobacco Use   Smoking status: Former    Types: Cigarettes   Smokeless tobacco: Never  Vaping Use   Vaping Use: Never used  Substance and Sexual Activity   Alcohol use: Yes    Comment: Drink half a bottle of vodka last Thursday.   Drug use: No    Types: Marijuana    Comment: past   Sexual activity: Not on file  Other Topics Concern   Not on file  Social History Narrative   Not on file   Social Determinants of Health   Financial Resource Strain: Not on file  Food Insecurity: Not on file  Transportation Needs: Not on file  Physical Activity: Not on file  Stress: Not on file  Social Connections: Not on file   Additional Social History:    Allergies:  No Known Allergies  Labs:  Results for orders placed or performed during the hospital encounter of 12/14/20 (from the past 48 hour(s))  Comprehensive metabolic panel     Status: Abnormal   Collection Time: 12/14/20 11:32 AM  Result Value Ref Range   Sodium 135 135 - 145 mmol/L   Potassium 3.4 (L) 3.5 - 5.1 mmol/L   Chloride 95 (L) 98 - 111 mmol/L   CO2 27 22 - 32 mmol/L   Glucose, Bld 270 (H) 70 - 99 mg/dL    Comment: Glucose reference range applies only to samples taken after fasting for at least 8 hours.   BUN 15 6 - 20 mg/dL   Creatinine, Ser 0.83 0.61 - 1.24 mg/dL   Calcium 9.3 8.9 - 10.3 mg/dL   Total Protein 7.9 6.5 - 8.1 g/dL   Albumin 4.3 3.5 - 5.0 g/dL   AST 18 15 - 41 U/L   ALT 11 0 - 44 U/L   Alkaline Phosphatase 108 38 - 126 U/L   Total Bilirubin 2.5 (H) 0.3 - 1.2 mg/dL   GFR, Estimated >60 >60 mL/min    Comment: (NOTE) Calculated using the CKD-EPI Creatinine Equation (2021)    Anion gap 13 5 - 15    Comment: Performed at [facility], [address] [place], [place] [address]  Ethanol     Status: None   Collection Time: 12/14/20 11:32 AM  Result Value Ref Range   Alcohol, Ethyl (B) <10 <10  mg/dL    Comment: (NOTE) Lowest detectable limit for serum alcohol is 10 mg/dL.  For medical purposes only. Performed at [facility], [address] [place],  [place] [address]   Salicylate level     Status: Abnormal   Collection Time: 12/14/20 11:32 AM  Result Value Ref Range   Salicylate Lvl <3.1 (L) 7.0 - 30.0 mg/dL    Comment: Performed at [facility], [address] [place], [place] [address]  Acetaminophen level     Status: Abnormal   Collection Time: 12/14/20 11:32 AM  Result Value Ref Range   Acetaminophen (Tylenol), Serum <10 (L) 10 - 30 ug/mL    Comment: (NOTE) Therapeutic concentrations vary significantly. A range of 10-30 ug/mL  may be an effective concentration for many patients. However, some  are best treated at concentrations outside of this range. Acetaminophen concentrations >150 ug/mL at 4 hours after ingestion  and >50 ug/mL at 12 hours after ingestion are often associated with  toxic reactions.  Performed at [facility], [address] [place], [place] [address]   cbc     Status: Abnormal   Collection Time: 12/14/20 11:32 AM  Result Value Ref Range   WBC 9.0 4.0 - 10.5 K/uL   RBC 4.90 4.22 - 5.81 MIL/uL   Hemoglobin 15.9 13.0 - 17.0 g/dL   HCT 43.5 39.0 - 52.0 %   MCV 88.8 80.0 - 100.0 fL   MCH 32.4 26.0 - 34.0 pg   MCHC 36.6 (H) 30.0 - 36.0 g/dL   RDW 15.0 11.5 - 15.5 %   Platelets 145 (L) 150 - 400 K/uL   nRBC 0.0 0.0 - 0.2 %    Comment: Performed at [facility], [address] [place], [place] [address]  Resp Panel by RT-PCR (Flu A&B, Covid) Nasopharyngeal Swab     Status: None   Collection Time: 12/14/20 12:20 PM   Specimen: Nasopharyngeal Swab; Nasopharyngeal(NP) swabs in vial transport medium  Result Value Ref Range   SARS Coronavirus 2 by RT PCR NEGATIVE NEGATIVE    Comment: (NOTE) SARS-CoV-2 target nucleic acids are NOT DETECTED.  The SARS-CoV-2 RNA is generally detectable in upper  respiratory specimens during the acute phase of infection. The lowest concentration of SARS-CoV-2 viral copies this assay can detect is 138 copies/mL. A negative result does not preclude SARS-Cov-2 infection and should not be used as the sole basis for treatment or other patient management decisions. A negative result may occur with  improper specimen collection/handling, submission of specimen other than nasopharyngeal swab, presence of viral mutation(s) within the areas targeted by this assay, and inadequate number of viral copies(<138 copies/mL). A negative result must be combined with clinical observations, patient history, and epidemiological information. The expected result is Negative.  Fact Sheet for Patients:  EntrepreneurPulse.com.au  Fact Sheet for Healthcare Providers:  IncredibleEmployment.be  This test is no t yet approved or cleared by the [place] FDA and  has been authorized for detection and/or diagnosis of SARS-CoV-2 by FDA under an Emergency Use Authorization (EUA). This EUA will remain  in effect (meaning this test can be used) for the duration of the COVID-19 declaration under Section 564(b)(1) of the Act, 21 U.S.C.section 360bbb-3(b)(1), unless the authorization is terminated  or revoked sooner.       Influenza A by PCR NEGATIVE NEGATIVE   Influenza B by PCR NEGATIVE NEGATIVE    Comment: (NOTE) The Xpert Xpress SARS-CoV-2/FLU/RSV plus assay is intended as an aid in the diagnosis of influenza from Nasopharyngeal swab specimens and should not be used as a sole basis for treatment. Nasal washings and aspirates are unacceptable for Xpert Xpress SARS-CoV-2/FLU/RSV testing.  Fact Sheet for Patients: EntrepreneurPulse.com.au  Fact Sheet for Healthcare Providers: IncredibleEmployment.be  This test is not yet approved or cleared by the [place] FDA and has been authorized for  detection and/or diagnosis of SARS-CoV-2 by FDA under an Emergency Use Authorization (EUA). This EUA will remain in effect (meaning this test can be used) for the duration of the COVID-19 declaration under Section 564(b)(1) of the Act, 21 U.S.C. section 360bbb-3(b)(1), unless the authorization is terminated or revoked.  Performed at [facility], [address] [place],  [address]     Current Facility-Administered Medications  Medication Dose Route Frequency Provider Last Rate Last Admin   apixaban (ELIQUIS) tablet 10 mg  10 mg Oral BID Merlyn Lot, MD   10 mg at 12/14/20 1229   [START ON 12/21/2020] apixaban (ELIQUIS) tablet 5 mg  5 mg Oral BID Merlyn Lot, MD       carvedilol (COREG) tablet 25 mg  25 mg Oral BID Merlyn Lot, MD   25 mg at 12/14/20 1229   metFORMIN (GLUCOPHAGE) tablet 1,000 mg  1,000 mg Oral BID WC Merlyn Lot, MD       Current Outpatient Medications  Medication Sig Dispense Refill   apixaban (ELIQUIS) 5 MG TABS tablet TAKE 2 TABLETS (10MG  TOTAL) BY MOUTH TWICE DAILY FOR 7 DAYS. THEN TAKE ONE TABLET (5MG  TOTAL) BY MOUTH TWICE DAILY THEREAFTER. 74 tablet 0   carvedilol (COREG) 25 MG tablet Take 1 tablet (25 mg total) by mouth 2 (two) times daily. 60 tablet 1   metFORMIN (GLUCOPHAGE) 1000 MG tablet Take 1 tablet (1,000 mg total) by mouth 2 (two) times daily with a meal. 60 tablet 1    Musculoskeletal: Strength & Muscle Tone: within normal limits Gait & Station: normal Patient leans: N/A            Psychiatric Specialty Exam:  Presentation  General Appearance:  Appropriate for Environment Eye Contact: Good Speech: Clear and Coherent Speech Volume: Normal Handedness: No data recorded  Mood and Affect  Mood: Depressed; Dysphoric Affect: Appropriate; Congruent  Thought Process  Thought Processes: Coherent Descriptions of Associations:Intact Orientation:Full (Time, Place and Person) Thought  Content:Logical History of Schizophrenia/Schizoaffective disorder:No data recorded Duration of Psychotic Symptoms:No data recorded Hallucinations:Hallucinations: Auditory Description of Auditory Hallucinations: "muffled voices" Ideas of Reference:None Suicidal Thoughts:Suicidal Thoughts: Yes, Active Homicidal Thoughts:Homicidal Thoughts: Yes, Passive  Sensorium  Memory: Immediate Good Judgment: Impaired Insight: Fair  Materials engineer: Fair Attention Span: Good Recall: [employer] of Knowledge: Fair Language: Fair  Psychomotor Activity  Psychomotor Activity: Psychomotor Activity: Normal  Assets  Assets: Communication Skills; Desire for Improvement  Sleep  Sleep: Sleep: Fair  Physical Exam: Physical Exam Vitals and nursing note reviewed.  HENT:     Head: Normocephalic.     Nose: No congestion or rhinorrhea.  Eyes:     General:        Right eye: No discharge.        Left eye: No discharge.  Pulmonary:     Effort: Pulmonary effort is normal.  Musculoskeletal:     Cervical back: Normal range of motion.     Comments: 2 strokes past year. Walks slowly   Skin:    General: Skin is dry.  Neurological:     Mental Status: He is alert and oriented to person, place, and time.  Psychiatric:        Attention and Perception: Attention normal.        Mood and Affect: Mood is  depressed. Affect is blunt and flat.        Speech: Speech normal.        Behavior: Behavior is slowed.        Thought Content: Thought content includes homicidal (passive) and suicidal ideation.        Cognition and Memory: Cognition normal.        Judgment: Judgment normal.   Review of Systems  Neurological:  Positive for weakness (d/t stroke).  Psychiatric/Behavioral:  Positive for depression and suicidal ideas.   Blood pressure (!) 188/115, pulse (!) 101, temperature 99.1 F (37.3 C), temperature source Oral, resp. rate 15, height 5\' 6"  (1.676 m), weight 77.1 kg, SpO2  97 %. Body mass index is 27.44 kg/m.  Treatment Plan Summary: 50 year old male with history of depression, alcohol misuse, suicidal ideations, stroke, diabetes who presents to the emergency room with worsening depression and thoughts of suicide by stopping all medications or overdosing.  Recommend admission for medication management and stabilization. Patient's glucose and vitals need to be stabilized prior to consideration of psychiatric hospitalization   Disposition: Recommend psychiatric Inpatient admission when medically cleared. Supportive therapy provided about ongoing stressors.  Sherlon Handing, NP 12/14/2020 2:32 PM
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
## 2020-12-14 NOTE — ED Notes (Signed)
IVC pending inpatient

## 2020-12-14 NOTE — Progress Notes (Signed)
Inpatient Diabetes Program Recommendations  AACE/ADA: New Consensus Statement on Inpatient Glycemic Control   Target Ranges:  Prepandial:   less than 140 mg/dL      Peak postprandial:   less than 180 mg/dL (1-2 hours)      Critically ill patients:  140 - 180 mg/dL   Results for Christian [name], Christian [name] (MRN [id number]) as of 12/14/2020 12:44  Ref. Range 12/12/2020 12:14 12/14/2020 11:32  Glucose Latest Ref Range: 70 - 99 mg/dL 357 (H) 270 (H)   Review of Glycemic Control  Diabetes history: DM2 Outpatient Diabetes medications: Metformin 1000 mg BID Current orders for Inpatient glycemic control: Metformin 1000 mg BID  Inpatient Diabetes Program Recommendations:    Insulin: Please consider ordering CBGs AC&HS with Novolog 0-9 units TID with meals and Novolog 0-5 units QHS.  Diet: Please discontinue Regular diet and order Carb Modified diet.  Thanks, Barnie Alderman, RN, MSN, CDE Diabetes Coordinator Inpatient Diabetes Program [phone] (Team Pager from 8am to 5pm)

## 2020-12-14 NOTE — Plan of Care (Signed)
Patient new to the unit tonight, hasn't had time to progress  Problem: Education: Goal: Knowledge of [facility] Education information/materials will improve Outcome: Not Progressing Goal: Emotional status will improve Outcome: Not Progressing Goal: Mental status will improve Outcome: Not Progressing Goal: Verbalization of understanding the information provided will improve Outcome: Not Progressing   Problem: Safety: Goal: Periods of time without injury will increase Outcome: Not Progressing   Problem: Education: Goal: Ability to make informed decisions regarding treatment will improve Outcome: Not Progressing   Problem: Medication: Goal: Compliance with prescribed medication regimen will improve Outcome: Not Progressing

## 2020-12-14 NOTE — Progress Notes (Signed)
   12/14/20 1310  Clinical Encounter Type  Visited With Patient  Visit Type Initial;Spiritual support;Social support  Referral From Other (Comment) (rounding)  Spiritual Encounters  Spiritual Needs Grief support;Emotional  Chaplain Burris engaged Pt to introduce herself and pastoral care services. Chaplain provided compassionate presence and reflective listening. Pt lost mother 13mos ago and experienced some family upheaval over financial dispute and change in the home. This resulted in familial exclusion and pressure to consider independent living that he cannot afford. Simultaneously he disengaged from friend network and has become increasingly isolated. Chaplain encouraged use of available chaplain resources as needed. Will continue to follow.

## 2020-12-14 NOTE — ED Notes (Signed)
Pt. Was offered snack but refused, instead pt wanted just ice water.

## 2020-12-15 DIAGNOSIS — F332 Major depressive disorder, recurrent severe without psychotic features: Principal | ICD-10-CM

## 2020-12-15 DIAGNOSIS — I82409 Acute embolism and thrombosis of unspecified deep veins of unspecified lower extremity: Secondary | ICD-10-CM | POA: Diagnosis present

## 2020-12-15 LAB — GLUCOSE, CAPILLARY
Glucose-Capillary: 434 mg/dL — ABNORMAL HIGH (ref 70–99)
Glucose-Capillary: 390 mg/dL — ABNORMAL HIGH (ref 70–99)
Glucose-Capillary: 215 mg/dL — ABNORMAL HIGH (ref 70–99)
Glucose-Capillary: 260 mg/dL — ABNORMAL HIGH (ref 70–99)

## 2020-12-15 LAB — HEMOGLOBIN A1C
Hgb A1c MFr Bld: 9 % — ABNORMAL HIGH (ref 4.8–5.6)
Mean Plasma Glucose: 212 mg/dL

## 2020-12-15 MED ORDER — MIRTAZAPINE 15 MG PO TABS
30.0000 mg | ORAL_TABLET | Freq: Every day | ORAL | Status: DC
  Administered 2020-12-15 – 2020-12-20 (×6): 30 mg via ORAL
  Filled 2020-12-15 (×6): qty 2

## 2020-12-15 MED ORDER — INSULIN ASPART 100 UNIT/ML IJ SOLN
0.0000 [IU] | Freq: Three times a day (TID) | INTRAMUSCULAR | Status: DC
  Administered 2020-12-15: 3 [IU] via SUBCUTANEOUS
  Administered 2020-12-15: 9 [IU] via SUBCUTANEOUS
  Administered 2020-12-16: 5 [IU] via SUBCUTANEOUS
  Administered 2020-12-16 (×2): 3 [IU] via SUBCUTANEOUS
  Administered 2020-12-17: 5 [IU] via SUBCUTANEOUS
  Administered 2020-12-17 – 2020-12-18 (×3): 3 [IU] via SUBCUTANEOUS
  Administered 2020-12-18: 5 [IU] via SUBCUTANEOUS
  Administered 2020-12-18: 3 [IU] via SUBCUTANEOUS
  Administered 2020-12-19 (×2): 2 [IU] via SUBCUTANEOUS
  Administered 2020-12-19: 3 [IU] via SUBCUTANEOUS
  Administered 2020-12-20: 2 [IU] via SUBCUTANEOUS
  Administered 2020-12-20: 1 [IU] via SUBCUTANEOUS
  Administered 2020-12-20: 7 [IU] via SUBCUTANEOUS
  Administered 2020-12-21: 3 [IU] via SUBCUTANEOUS
  Administered 2020-12-21: 1 [IU] via SUBCUTANEOUS
  Filled 2020-12-15 (×13): qty 1

## 2020-12-15 MED ORDER — INSULIN ASPART 100 UNIT/ML IJ SOLN
0.0000 [IU] | Freq: Every day | INTRAMUSCULAR | Status: DC
Start: 2020-12-15 — End: 2020-12-21
  Administered 2020-12-15 – 2020-12-16 (×2): 3 [IU] via SUBCUTANEOUS
  Administered 2020-12-17: 2 [IU] via SUBCUTANEOUS
  Filled 2020-12-15 (×2): qty 1

## 2020-12-15 MED ORDER — ATORVASTATIN CALCIUM 20 MG PO TABS
40.0000 mg | ORAL_TABLET | Freq: Every day | ORAL | Status: DC
  Administered 2020-12-15 – 2020-12-21 (×7): 40 mg via ORAL
  Filled 2020-12-15 (×7): qty 2

## 2020-12-15 NOTE — Progress Notes (Signed)
Patient alert and oriented x 4, affect is blunted, thoughts are organized and coherent no distress noted , he denies SI/HI/AVH interacting appropriately with peers and staff, complaint with medication regimen. 15 minutes safety checks maintained will continue to monitor.

## 2020-12-15 NOTE — BHH Counselor (Signed)
Christian [name], patient, provided written consent for CSW team to coordinate aftercare at this time. CSW to have further conversations with patient regarding care in the community.   Signed:  Durenda Hurt, MSW, [place], LCASA 12/15/2020 11:04 AM

## 2020-12-15 NOTE — BH IP Treatment Plan (Addendum)
Interdisciplinary Treatment and Diagnostic Plan Update  12/15/2020 Time of Session: 9:00AM Christian [name] MRN: [id number]  Principal Diagnosis: <principal problem not specified>  Secondary Diagnoses: Active Problems:   MDD (major depressive disorder)   Current Medications:  Current Facility-Administered Medications  Medication Dose Route Frequency Provider Last Rate Last Admin   acetaminophen (TYLENOL) tablet 650 mg  650 mg Oral Q6H PRN Sherlon Handing, NP       alum & mag hydroxide-simeth (MAALOX/MYLANTA) 200-200-20 MG/5ML suspension 30 mL  30 mL Oral Q4H PRN Waldon Merl F, NP       apixaban (ELIQUIS) tablet 10 mg  10 mg Oral BID Waldon Merl F, NP   10 mg at 12/15/20 0814   [START ON 12/21/2020] apixaban (ELIQUIS) tablet 5 mg  5 mg Oral BID Waldon Merl F, NP       carvedilol (COREG) tablet 25 mg  25 mg Oral BID WC Waldon Merl F, NP   25 mg at 12/15/20 0109   magnesium hydroxide (MILK OF MAGNESIA) suspension 30 mL  30 mL Oral Daily PRN Waldon Merl F, NP       metFORMIN (GLUCOPHAGE) tablet 1,000 mg  1,000 mg Oral BID WC Waldon Merl F, NP   1,000 mg at 12/15/20 3235   PTA Medications: Medications Prior to Admission  Medication Sig Dispense Refill Last Dose   apixaban (ELIQUIS) 5 MG TABS tablet TAKE 2 TABLETS (10MG TOTAL) BY MOUTH TWICE DAILY FOR 7 DAYS. THEN TAKE ONE TABLET (5MG TOTAL) BY MOUTH TWICE DAILY THEREAFTER. (Patient not taking: No sig reported) 74 tablet 0    carvedilol (COREG) 25 MG tablet Take 1 tablet (25 mg total) by mouth 2 (two) times daily. (Patient not taking: No sig reported) 60 tablet 1    metFORMIN (GLUCOPHAGE) 1000 MG tablet Take 1 tablet (1,000 mg total) by mouth 2 (two) times daily with a meal. (Patient not taking: No sig reported) 60 tablet 1     Patient Stressors: Loss of Mother   Substance abuse    Patient Strengths: Ability for insight  Motivation for treatment/growth   Treatment Modalities: Medication Management, Group  therapy, Case management,  1 to 1 session with clinician, Psychoeducation, Recreational therapy.   Physician Treatment Plan for Primary Diagnosis: <principal problem not specified> Long Term Goal(s):     Short Term Goals:    Medication Management: Evaluate patient's response, side effects, and tolerance of medication regimen.  Therapeutic Interventions: 1 to 1 sessions, Unit Group sessions and Medication administration.  Evaluation of Outcomes: Not Met  Physician Treatment Plan for Secondary Diagnosis: Active Problems:   MDD (major depressive disorder)  Long Term Goal(s):     Short Term Goals:       Medication Management: Evaluate patient's response, side effects, and tolerance of medication regimen.  Therapeutic Interventions: 1 to 1 sessions, Unit Group sessions and Medication administration.  Evaluation of Outcomes: Not Met   RN Treatment Plan for Primary Diagnosis: <principal problem not specified> Long Term Goal(s): Knowledge of disease and therapeutic regimen to maintain health will improve  Short Term Goals: Ability to remain free from injury will improve, Ability to participate in decision making will improve, Ability to verbalize feelings will improve, Ability to disclose and discuss suicidal ideas, Ability to identify and develop effective coping behaviors will improve, and Compliance with prescribed medications will improve  Medication Management: RN will administer medications as ordered by provider, will assess and evaluate patient's response and provide education to patient for prescribed medication.  RN will report any adverse and/or side effects to prescribing provider.  Therapeutic Interventions: 1 on 1 counseling sessions, Psychoeducation, Medication administration, Evaluate responses to treatment, Monitor vital signs and CBGs as ordered, Perform/monitor CIWA, COWS, AIMS and Fall Risk screenings as ordered, Perform wound care treatments as ordered.  Evaluation  of Outcomes: Not Met   LCSW Treatment Plan for Primary Diagnosis: <principal problem not specified> Long Term Goal(s): Safe transition to appropriate next level of care at discharge, Engage patient in therapeutic group addressing interpersonal concerns.  Short Term Goals: Engage patient in aftercare planning with referrals and resources, Increase social support, Increase ability to appropriately verbalize feelings, Increase emotional regulation, and Increase skills for wellness and recovery  Therapeutic Interventions: Assess for all discharge needs, 1 to 1 time with Social worker, Explore available resources and support systems, Assess for adequacy in community support network, Educate family and significant other(s) on suicide prevention, Complete Psychosocial Assessment, Interpersonal group therapy.  Evaluation of Outcomes: Not Met   Progress in Treatment: Attending groups: No. Participating in groups: No. Taking medication as prescribed: Yes. Toleration medication: Yes. Family/Significant other contact made: No, will contact:  when given permission. Patient understands diagnosis: Yes. Discussing patient identified problems/goals with staff: Yes. Medical problems stabilized or resolved: Yes. Denies suicidal/homicidal ideation: Yes. Issues/concerns per patient self-inventory: No. Other: None.  New problem(s) identified: No, Describe:  none.  New Short Term/Long Term Goal(s): medication management for mood stabilization; elimination of SI thoughts; development of comprehensive mental wellness plan.  Patient Goals:  "I don't know what there is to work on. There's nothing here for me."  Discharge Plan or Barriers: CSW will assist pt with development of an appropriate aftercare/discharge plan.   Reason for Continuation of Hospitalization: Depression Medication stabilization Suicidal ideation  Estimated Length of Stay: 1-7 days   Scribe for Treatment Team: Shirl Harris,  LCSW 12/15/2020 9:21 AM

## 2020-12-15 NOTE — BHH Counselor (Signed)
Adult Comprehensive Assessment  Patient ID: Christian [name], male   DOB: April 01, 1970, 50 y.o.   MRN: [id number]  Information Source: Information source: (P) Patient  Current Stressors:  Patient states their primary concerns and needs for treatment are:: (P) Pt reports significant limitations due to stroke suffered in Dec 2020 and May 2021 leading to SI. Patient states their goals for this hospitilization and ongoing recovery are:: (P) "I do not know" Educational / Learning stressors: (P) none reported Employment / Job issues: (P) Pt reports he is unemployed due to physical limitations and has been denied for SSDI. Family Relationships: (P) "there is no relationshipPublishing copy / Lack of resources (include bankruptcy): (P) Pt reports no income Housing / Lack of housing: (P) Pt reports he is living with his son who bought the pt's mother's home after she died. Physical health (include injuries & life threatening diseases): (P) Pt endorses multiple ailments, hx of stroke, blood clot in leg treated earlier in the week, diabetes, scoliosis, and neuropathy in feet. Social relationships: (P) Pt reports he does not have any friends. Substance abuse: (P) Pt endorses alcohol use, see sectin below. Bereavement / Loss: (P) Death of mother 14 months ago.  Living/Environment/Situation:  Living Arrangements: (P) Children Living conditions (as described by patient or guardian): (P) WNL Who else lives in the home?: (P) Lives with adult son and his nuclear family. How long has patient lived in current situation?: (P) 7 years What is atmosphere in current home: (P) Chaotic, Temporary  Family History:  Marital status: (P) Divorced Divorced, when?: (P) 1996 What types of issues is patient dealing with in the relationship?: (P) None reported Are you sexually active?: (P) No What is your sexual orientation?: (P) heterosexual Has your sexual activity been affected by drugs, alcohol, medication, or emotional  stress?: (P) no Does patient have children?: (P) Yes How many children?: (P) 2 How is patient's relationship with their children?: (P) Pt reports he has little to no interaction with daughter and a strained relationship with his son that he lives with,  Childhood History:  By whom was/is the patient raised?: (P) Both parents Description of patient's relationship with caregiver when they were a child: (P) Pt reports "good". Patient's description of current relationship with people who raised him/her: (P) Both pt's parents are deceased How were you disciplined when you got in trouble as a child/adolescent?: (P) Pt reports that there wasn't any. Does patient have siblings?: (P) Yes Number of Siblings: (P) 2 Description of patient's current relationship with siblings: (P) Pt reports "good". Did patient suffer any verbal/emotional/physical/sexual abuse as a child?: (P) No Did patient suffer from severe childhood neglect?: (P) No Has patient ever been sexually abused/assaulted/raped as an adolescent or adult?: (P) No Was the patient ever a victim of a crime or a disaster?: (P) No Witnessed domestic violence?: (P) No Has patient been affected by domestic violence as an adult?: (P) No  Education:  Highest grade of school patient has completed: (P) HS diploma; vocational school Currently a student?: (P) No Learning disability?: (P) No  Employment/Work Situation:   Employment Situation: Unemployed Patient's Job has Been Impacted by Current Illness: Yes Describe how Patient's Job has Been Impacted: Pt reports he is unable to be employed due to strokes suffered in 2020 and 2021 What is the Longest Time Patient has Held a Job?: 6 years Where was the Patient Employed at that Time?: [employer] Department Has Patient ever Been in [employer]?: Yes (Describe in comment)  Did You Receive Any Psychiatric Treatment/Services While in the Military?: No  Financial Resources:   Financial resources:  No income Does patient have a Programmer, applications or guardian?: No  Alcohol/Substance Abuse:   What has been your use of drugs/alcohol within the last 12 months?: Alcohol, 6 standard drinks per day as financial situation permits. If attempted suicide, did drugs/alcohol play a role in this?: No Alcohol/Substance Abuse Treatment Hx: Past Tx, Outpatient If yes, describe treatment: outpt [facility] SUD tx Has alcohol/substance abuse ever caused legal problems?: Yes  Social Support System:   Patient's Community Support System: Poor Describe Community Support System: Pt denies adequate supports Type of faith/religion: "Chrisitan"  Leisure/Recreation:   Do You Have Hobbies?: No  Strengths/Needs:   What is the patient's perception of their strengths?: Pt unable to identify personal strenghts. Patient states they can use these personal strengths during their treatment to contribute to their recovery: n/a Patient states these barriers may affect/interfere with their treatment: none reported Patient states these barriers may affect their return to the community: none reported Other important information patient would like considered in planning for their treatment: none reported  Discharge Plan:   Currently receiving community mental health services: No Patient states concerns and preferences for aftercare planning are: Pt interested in recieving services with [facility] health services. Patient states they will know when they are safe and ready for discharge when: "When you say I am" Does patient have access to transportation?: No Does patient have financial barriers related to discharge medications?: No Patient description of barriers related to discharge medications: [place] Medicaid Plan for no access to transportation at discharge: CSW to assit with transportation at discharge. Will patient be returning to same living situation after discharge?: Yes  Summary/Recommendations:   Summary and  Recommendations (to be completed by the evaluator): 50 y/o male w/ hx of Stroke x2, CAD, HTN, Type II Diabetes, MDD, recurrent, severe, Alcohol Use Disorder, Moderate, amoung other medical complexities was admitted due to ongoing chronic SI. Pt reports considering suicide, obtained daughter in-law's gun and slept with it under his pillow. The next morning (12/14/20) he returnted the gun and presented to the [facility]. Patient shared that he had court on monday though he decided to go to the hospital related to a blood clot in his leg instead, pt reports other psychosocial stressors leading to SI including interpersonal conflict, unemployment, and financial/food insecurity. Pt presents as orriented x3 w/ depressed affect congruent w/ context, no evidence with memory or concentration impairment. Patient exhibits minor difficulty with speech related to risidual effects of stroke. Theraputic recomendations include crisis stabilization, medication stabilization, encouraged group therapy, and individualized case management.  Durenda Hurt. 12/15/2020

## 2020-12-15 NOTE — Progress Notes (Signed)
Inpatient Diabetes Program Recommendations  AACE/ADA: New Consensus Statement on Inpatient Glycemic Control (2015)  Target Ranges:  Prepandial:   less than 140 mg/dL      Peak postprandial:   less than 180 mg/dL (1-2 hours)      Critically ill patients:  140 - 180 mg/dL   Lab Results  Component Value Date   GLUCAP 255 (H) 08/23/2019   HGBA1C 7.7 (H) 10/20/2019    Review of Glycemic Control  Diabetes history: DM2 Outpatient Diabetes medications: Metformin 1 gm bid (not taking) Current orders for Inpatient glycemic control: Metformin 1 gm bid  Inpatient Diabetes Program Recommendations:   -Please consider Glycemic control order set with 0-9 units tid correction + hs 0-5 units. Secure chat sent to Dr. Weber Cooks.  Thank you, Nani Gasser. Haneen Bernales, RN, MSN, CDE  Diabetes Coordinator Inpatient Glycemic Control Team Team Pager [phone] (8am-5pm) 12/15/2020 9:56 AM

## 2020-12-15 NOTE — Progress Notes (Signed)
Recreation Therapy Notes  INPATIENT RECREATION THERAPY ASSESSMENT  Patient Details Name: GARFIELD COINER MRN: [id number] DOB: [birth year] Today's Date: 12/15/2020       Information Obtained From: Patient  Able to Participate in Assessment/Interview: Yes  Patient Presentation: Responsive  Reason for Admission (Per Patient): Active Symptoms  Patient Stressors: Family, Death  Coping Skills:   Substance Abuse  Leisure Interests (2+):  Individual - TV, Music - Listen  Frequency of Recreation/Participation: Weekly  Awareness of Community Resources:  Yes  Community Resources:  Park  Current Use: No  If no, Barriers?: Hydrographic surveyor, Transport planner, Museum/gallery curator  Expressed Interest in [place]: No  [employer] of Residence:  Insurance underwriter  Patient Main Form of Transportation: Diplomatic Services operational officer  Patient Strengths:  Nice  Patient Identified Areas of Improvement:  N/A  Patient Goal for Hospitalization:  Get my disability  Current SI (including self-harm):  No  Current HI:  No  Current AVH: No  Staff Intervention Plan: Group Attendance, Collaborate with Interdisciplinary Treatment Team  Consent to Intern Participation: N/A  Chinaza Rooke 12/15/2020, 3:06 PM

## 2020-12-15 NOTE — BHH Suicide Risk Assessment (Signed)
[place] INPATIENT:  Family/Significant Other Suicide Prevention Education  Suicide Prevention Education:  Patient Refusal for Family/Significant Other Suicide Prevention Education: The patient Christian [name] has refused to provide written consent for family/significant other to be provided Family/Significant Other Suicide Prevention Education during admission and/or prior to discharge.  Physician notified.  Durenda Hurt 12/15/2020, 9:59 AM

## 2020-12-15 NOTE — Progress Notes (Signed)
Recreation Therapy Notes  Date: 12/14/2020  Time: 10:00 am   Location: Craft room   Behavioral response: N/A   Intervention Topic: Relaxation    Discussion/Intervention: Patient did not attend group.   Clinical Observations/Feedback:  Patient did not attend group.   Benoit Meech LRT/CTRS        Caidence Kaseman 12/15/2020 12:25 PM

## 2020-12-15 NOTE — H&P (Signed)
Psychiatric Admission Assessment Adult  Patient Identification: Christian [name] MRN:  [id number] Date of Evaluation:  12/15/2020 Chief Complaint:  MDD (major depressive disorder) [F32.9] Principal Diagnosis: Severe episode of recurrent major depressive disorder, without psychotic features ([place]) Diagnosis:  Principal Problem:   Severe episode of recurrent major depressive disorder, without psychotic features ([place]) Active Problems:   CAD (coronary artery disease)   HTN (hypertension)   Hyperlipidemia   Diabetes mellitus, type II (HCC)   Alcohol use disorder, moderate, dependence ([place])   MDD (major depressive disorder)   Deep vein thrombosis (DVT) ([place])  History of Present Illness: Patient seen and chart reviewed.  Patient known from previous encounters.  50 year old man with a history of recurrent severe depression and alcohol abuse.  Patient came voluntarily to the hospital reporting worsening depression with active suicidal thought.  Mood is chronically depressed and has been particularly bad for the last couple months.  Stays sad and down and negative most of the time.  Has very little or no enjoyment in life.  Very little energy and no motivation to take care of himself.  Feels hopeless most of the time.  Intermittent passive suicidal ideation but for the last few days was having more active thoughts of suicide including thoughts of shooting himself.  Chronic stresses are very bad financial situation, multiple medical problems that he has not taken care of, missing his mother who died within the last year or so and then on top of that he had a DWI for which he was supposed to attend court on Tuesday but missed it because he was in the hospital with a blood clot.  Now he is afraid they are going to arrest him again.  Patient has not been going to any medical treatment and has not been taking care of his medical problems.  Additionally patient admits that he continues to drink alcohol when he can  afford it.  The only thing limiting his alcohol consumption is when he is able to buy alcohol so he is only binging a couple times a month. Associated Signs/Symptoms: Depression Symptoms:  depressed mood, anhedonia, insomnia, psychomotor retardation, fatigue, feelings of worthlessness/guilt, difficulty concentrating, hopelessness, impaired memory, suicidal thoughts with specific plan, anxiety, loss of energy/fatigue, disturbed sleep, Duration of Depression Symptoms: No data recorded (Hypo) Manic Symptoms:  Impulsivity, Anxiety Symptoms:  Excessive Worry, Psychotic Symptoms:   Patient reports that on Monday of this week he had an episode in which he thought that he was hearing voices in the house.  In both cases there was actually some mechanical noise such as a fish tank pump but he was hearing it as voices.  Not happening today.  It sounds very much like it was during a period of alcohol withdrawal. PTSD Symptoms: Negative Total Time spent with patient: 1 hour  Past Psychiatric History: Patient has had several prior hospitalizations and a history of chronic depression.  He has had at least one serious suicide attempt in the past.  History of intermittent alcohol abuse complicating his situation as well as a history of multiple medical problems.  Is the patient at risk to self? Yes.    Has the patient been a risk to self in the past 6 months? Yes.    Has the patient been a risk to self within the distant past? Yes.    Is the patient a risk to others? No.  Has the patient been a risk to others in the past 6 months? No.  Has the  patient been a risk to others within the distant past? No.   Prior Inpatient Therapy:   Prior Outpatient Therapy:    Alcohol Screening: 1. How often do you have a drink containing alcohol?: 4 or more times a week 2. How many drinks containing alcohol do you have on a typical day when you are drinking?: 3 or 4 3. How often do you have six or more drinks on  one occasion?: Monthly AUDIT-C Score: 7 4. How often during the last year have you found that you were not able to stop drinking once you had started?: Never 5. How often during the last year have you failed to do what was normally expected from you because of drinking?: Never 6. How often during the last year have you needed a first drink in the morning to get yourself going after a heavy drinking session?: Never 7. How often during the last year have you had a feeling of guilt of remorse after drinking?: Never 8. How often during the last year have you been unable to remember what happened the night before because you had been drinking?: Never 9. Have you or someone else been injured as a result of your drinking?: No 10. Has a relative or friend or a doctor or another health worker been concerned about your drinking or suggested you cut down?: No Alcohol Use Disorder Identification Test Final Score (AUDIT): 7 Alcohol Brief Interventions/Follow-up: Alcohol education/Brief advice Substance Abuse History in the last 12 months:  Yes.   Consequences of Substance Abuse: Alcohol abuse complicates his depression as well as worsening his diabetes and blood pressure management Previous Psychotropic Medications: Yes  Psychological Evaluations: Yes  Past Medical History:  Past Medical History:  Diagnosis Date   CAD (coronary artery disease)    a. 07/2013 NSTEMI/PCI: LCX 21m (4.0x23 Xience DES), RCA 3m, EF > 55%;  b. 07/2013 Echo: EF 60-65%, diast dysfxn, mild LVH, mildly dil LA, nl RV size/fxn, nl RVSP.   Diabetes mellitus without complication ([place])    History of tobacco abuse    a. quit ~ 2010   Hyperlipidemia    Morbid obesity ([place])    a. weighed 168 @ age 63.   Scoliosis    Stroke [facility])     Past Surgical History:  Procedure Laterality Date   ADENOIDECTOMY     CARDIAC CATHETERIZATION  07/2013   [facility]'x1 stent   Family History:  Family History  Problem Relation Age of Onset   Heart disease  Mother    Esophageal cancer Father    Diabetes Sister    Family Psychiatric  History: None reported Tobacco Screening:   Social History:  Social History   Substance and Sexual Activity  Alcohol Use Yes   Comment: Drink half a bottle of vodka last Thursday.     Social History   Substance and Sexual Activity  Drug Use No   Types: Marijuana   Comment: past    Additional Social History: Marital status: (P) Divorced Divorced, when?: (P) 1996 What types of issues is patient dealing with in the relationship?: (P) None reported Are you sexually active?: (P) No What is your sexual orientation?: (P) heterosexual Has your sexual activity been affected by drugs, alcohol, medication, or emotional stress?: (P) no Does patient have children?: (P) Yes How many children?: (P) 2 How is patient's relationship with their children?: (P) Pt reports he has little to no interaction with daughter and a strained relationship with his son that he lives with,  Allergies:  No Known Allergies Lab Results:  Results for orders placed or performed during the hospital encounter of 12/14/20 (from the past 48 hour(s))  Comprehensive metabolic panel     Status: Abnormal   Collection Time: 12/14/20 11:32 AM  Result Value Ref Range   Sodium 135 135 - 145 mmol/L   Potassium 3.4 (L) 3.5 - 5.1 mmol/L   Chloride 95 (L) 98 - 111 mmol/L   CO2 27 22 - 32 mmol/L   Glucose, Bld 270 (H) 70 - 99 mg/dL    Comment: Glucose reference range applies only to samples taken after fasting for at least 8 hours.   BUN 15 6 - 20 mg/dL   Creatinine, Ser 0.83 0.61 - 1.24 mg/dL   Calcium 9.3 8.9 - 10.3 mg/dL   Total Protein 7.9 6.5 - 8.1 g/dL   Albumin 4.3 3.5 - 5.0 g/dL   AST 18 15 - 41 U/L   ALT 11 0 - 44 U/L   Alkaline Phosphatase 108 38 - 126 U/L   Total Bilirubin 2.5 (H) 0.3 - 1.2 mg/dL   GFR, Estimated >60 >60 mL/min    Comment: (NOTE) Calculated using the CKD-EPI Creatinine Equation  (2021)    Anion gap 13 5 - 15    Comment: Performed at [facility], [address] [place], [place] [address]  Ethanol     Status: None   Collection Time: 12/14/20 11:32 AM  Result Value Ref Range   Alcohol, Ethyl (B) <10 <10 mg/dL    Comment: (NOTE) Lowest detectable limit for serum alcohol is 10 mg/dL.  For medical purposes only. Performed at [facility], [address] [place], [place] [address]   Salicylate level     Status: Abnormal   Collection Time: 12/14/20 11:32 AM  Result Value Ref Range   Salicylate Lvl <9.5 (L) 7.0 - 30.0 mg/dL    Comment: Performed at [facility], [address] [place], [place] [address]  Acetaminophen level     Status: Abnormal   Collection Time: 12/14/20 11:32 AM  Result Value Ref Range   Acetaminophen (Tylenol), Serum <10 (L) 10 - 30 ug/mL    Comment: (NOTE) Therapeutic concentrations vary significantly. A range of 10-30 ug/mL  may be an effective concentration for many patients. However, some  are best treated at concentrations outside of this range. Acetaminophen concentrations >150 ug/mL at 4 hours after ingestion  and >50 ug/mL at 12 hours after ingestion are often associated with  toxic reactions.  Performed at [facility], [address] [place], [place] [address]   cbc     Status: Abnormal   Collection Time: 12/14/20 11:32 AM  Result Value Ref Range   WBC 9.0 4.0 - 10.5 K/uL   RBC 4.90 4.22 - 5.81 MIL/uL   Hemoglobin 15.9 13.0 - 17.0 g/dL   HCT 43.5 39.0 - 52.0 %   MCV 88.8 80.0 - 100.0 fL   MCH 32.4 26.0 - 34.0 pg   MCHC 36.6 (H) 30.0 - 36.0 g/dL   RDW 15.0 11.5 - 15.5 %   Platelets 145 (L) 150 - 400 K/uL   nRBC 0.0 0.0 - 0.2 %    Comment: Performed at [facility], [address] [place],  [address]  Resp Panel by RT-PCR (Flu A&B, Covid) Nasopharyngeal Swab     Status: None   Collection Time: 12/14/20 12:20 PM   Specimen: Nasopharyngeal Swab;  Nasopharyngeal(NP) swabs in vial transport medium  Result  Value Ref Range   SARS Coronavirus 2 by RT PCR NEGATIVE NEGATIVE    Comment: (NOTE) SARS-CoV-2 target nucleic acids are NOT DETECTED.  The SARS-CoV-2 RNA is generally detectable in upper respiratory specimens during the acute phase of infection. The lowest concentration of SARS-CoV-2 viral copies this assay can detect is 138 copies/mL. A negative result does not preclude SARS-Cov-2 infection and should not be used as the sole basis for treatment or other patient management decisions. A negative result may occur with  improper specimen collection/handling, submission of specimen other than nasopharyngeal swab, presence of viral mutation(s) within the areas targeted by this assay, and inadequate number of viral copies(<138 copies/mL). A negative result must be combined with clinical observations, patient history, and epidemiological information. The expected result is Negative.  Fact Sheet for Patients:  EntrepreneurPulse.com.au  Fact Sheet for Healthcare Providers:  IncredibleEmployment.be  This test is no t yet approved or cleared by the [place] FDA and  has been authorized for detection and/or diagnosis of SARS-CoV-2 by FDA under an Emergency Use Authorization (EUA). This EUA will remain  in effect (meaning this test can be used) for the duration of the COVID-19 declaration under Section 564(b)(1) of the Act, 21 U.S.C.section 360bbb-3(b)(1), unless the authorization is terminated  or revoked sooner.       Influenza A by PCR NEGATIVE NEGATIVE   Influenza B by PCR NEGATIVE NEGATIVE    Comment: (NOTE) The Xpert Xpress SARS-CoV-2/FLU/RSV plus assay is intended as an aid in the diagnosis of influenza from Nasopharyngeal swab specimens and should not be used as a sole basis for treatment. Nasal washings and aspirates are unacceptable for Xpert Xpress  SARS-CoV-2/FLU/RSV testing.  Fact Sheet for Patients: EntrepreneurPulse.com.au  Fact Sheet for Healthcare Providers: IncredibleEmployment.be  This test is not yet approved or cleared by the [place] FDA and has been authorized for detection and/or diagnosis of SARS-CoV-2 by FDA under an Emergency Use Authorization (EUA). This EUA will remain in effect (meaning this test can be used) for the duration of the COVID-19 declaration under Section 564(b)(1) of the Act, 21 U.S.C. section 360bbb-3(b)(1), unless the authorization is terminated or revoked.  Performed at [facility], [address] [place], [place] [address]   Urine Drug Screen, Qualitative     Status: None   Collection Time: 12/14/20  4:38 PM  Result Value Ref Range   Tricyclic, Ur Screen NONE DETECTED NONE DETECTED   Amphetamines, Ur Screen NONE DETECTED NONE DETECTED   MDMA (Ecstasy)Ur Screen NONE DETECTED NONE DETECTED   Cocaine Metabolite,Ur [place] NONE DETECTED NONE DETECTED   Opiate, Ur Screen NONE DETECTED NONE DETECTED   Phencyclidine (PCP) Ur S NONE DETECTED NONE DETECTED   Cannabinoid 50 Ng, Ur  NONE DETECTED NONE DETECTED   Barbiturates, Ur Screen NONE DETECTED NONE DETECTED   Benzodiazepine, Ur Scrn NONE DETECTED NONE DETECTED   Methadone Scn, Ur NONE DETECTED NONE DETECTED    Comment: (NOTE) Tricyclics + metabolites, urine    Cutoff 1000 ng/mL Amphetamines + metabolites, urine  Cutoff 1000 ng/mL MDMA (Ecstasy), urine              Cutoff 500 ng/mL Cocaine Metabolite, urine          Cutoff 300 ng/mL Opiate + metabolites, urine        Cutoff 300 ng/mL Phencyclidine (PCP), urine         Cutoff 25 ng/mL Cannabinoid, urine  Cutoff 50 ng/mL Barbiturates + metabolites, urine  Cutoff 200 ng/mL Benzodiazepine, urine              Cutoff 200 ng/mL Methadone, urine                   Cutoff 300 ng/mL  The urine drug screen provides only a preliminary,  unconfirmed analytical test result and should not be used for non-medical purposes. Clinical consideration and professional judgment should be applied to any positive drug screen result due to possible interfering substances. A more specific alternate chemical method must be used in order to obtain a confirmed analytical result. Gas chromatography / mass spectrometry (GC/MS) is the preferred confirm atory method. Performed at [facility], [address] [place], [place] [address]     Blood Alcohol level:  Lab Results  Component Value Date   [facility] <10 12/14/2020   ETH <10 62/26/3335    Metabolic Disorder Labs:  Lab Results  Component Value Date   HGBA1C 7.7 (H) 10/20/2019   MPG 237.43 08/21/2019   MPG 228.82 04/22/2019   No results found for: PROLACTIN Lab Results  Component Value Date   CHOL 126 10/20/2019   TRIG 175 (H) 10/20/2019   HDL 37 (L) 10/20/2019   CHOLHDL 3.4 10/20/2019   VLDL 42 (H) 08/21/2019   LDLCALC 60 10/20/2019   LDLCALC 85 08/21/2019    Current Medications: Current Facility-Administered Medications  Medication Dose Route Frequency Provider Last Rate Last Admin   acetaminophen (TYLENOL) tablet 650 mg  650 mg Oral Q6H PRN Sherlon Handing, NP       alum & mag hydroxide-simeth (MAALOX/MYLANTA) 200-200-20 MG/5ML suspension 30 mL  30 mL Oral Q4H PRN Sherlon Handing, NP       apixaban Arne Cleveland) tablet 10 mg  10 mg Oral BID Waldon Merl F, NP   10 mg at 12/15/20 0814   [START ON 12/21/2020] apixaban (ELIQUIS) tablet 5 mg  5 mg Oral BID Waldon Merl F, NP       carvedilol (COREG) tablet 25 mg  25 mg Oral BID WC Waldon Merl F, NP   25 mg at 12/15/20 4562   insulin aspart (novoLOG) injection 0-5 Units  0-5 Units Subcutaneous QHS Manpreet Strey T, MD       insulin aspart (novoLOG) injection 0-9 Units  0-9 Units Subcutaneous TID WC Anwen Cannedy T, MD       magnesium hydroxide (MILK OF MAGNESIA) suspension 30 mL  30 mL Oral Daily PRN  Waldon Merl F, NP       metFORMIN (GLUCOPHAGE) tablet 1,000 mg  1,000 mg Oral BID WC Waldon Merl F, NP   1,000 mg at 12/15/20 5638   PTA Medications: Medications Prior to Admission  Medication Sig Dispense Refill Last Dose   apixaban (ELIQUIS) 5 MG TABS tablet TAKE 2 TABLETS (10MG  TOTAL) BY MOUTH TWICE DAILY FOR 7 DAYS. THEN TAKE ONE TABLET (5MG  TOTAL) BY MOUTH TWICE DAILY THEREAFTER. (Patient not taking: No sig reported) 74 tablet 0    carvedilol (COREG) 25 MG tablet Take 1 tablet (25 mg total) by mouth 2 (two) times daily. (Patient not taking: No sig reported) 60 tablet 1    metFORMIN (GLUCOPHAGE) 1000 MG tablet Take 1 tablet (1,000 mg total) by mouth 2 (two) times daily with a meal. (Patient not taking: No sig reported) 60 tablet 1     Musculoskeletal: Strength & Muscle Tone: decreased Gait & Station: unsteady Patient leans: N/A  Psychiatric Specialty Exam:  Presentation  General Appearance: Appropriate for Environment  Eye Contact:Good  Speech:Clear and Coherent  Speech Volume:Normal  Handedness: No data recorded  Mood and Affect  Mood:Depressed; Dysphoric  Affect:Appropriate; Congruent   Thought Process  Thought Processes:Coherent  Duration of Psychotic Symptoms: No data recorded Past Diagnosis of Schizophrenia or Psychoactive disorder: No data recorded Descriptions of Associations:Intact  Orientation:Full (Time, Place and Person)  Thought Content:Logical  Hallucinations:Hallucinations: Auditory Description of Auditory Hallucinations: "muffled voices"  Ideas of Reference:None  Suicidal Thoughts:Suicidal Thoughts: Yes, Active  Homicidal Thoughts:Homicidal Thoughts: Yes, Passive   Sensorium  Memory:Immediate Good  Judgment:Impaired  Insight:Fair   Executive Functions  Concentration:Fair  Attention Span:Good  [place]   Psychomotor Activity  Psychomotor  Activity:Psychomotor Activity: Normal   Assets  Assets:Communication Skills; Desire for Improvement   Sleep  Sleep:Sleep: Fair    Physical Exam: Physical Exam Vitals and nursing note reviewed.  Constitutional:      Appearance: Normal appearance.  HENT:     Head: Normocephalic and atraumatic.     Mouth/Throat:     Pharynx: Oropharynx is clear.  Eyes:     Pupils: Pupils are equal, round, and reactive to light.  Cardiovascular:     Rate and Rhythm: Normal rate and regular rhythm.  Pulmonary:     Effort: Pulmonary effort is normal.     Breath sounds: Normal breath sounds.  Abdominal:     General: Abdomen is flat.     Palpations: Abdomen is soft.  Musculoskeletal:        General: Normal range of motion.  Skin:    General: Skin is warm and dry.  Neurological:     Mental Status: He is alert. Mental status is at baseline.     Motor: Weakness present.     Coordination: Coordination abnormal.     Gait: Gait abnormal.     Comments: Bilateral neuropathy with numbness in both hands and feet  Psychiatric:        Attention and Perception: He is inattentive.        Mood and Affect: Mood is depressed. Affect is blunt.        Speech: Speech is delayed.        Behavior: Behavior is slowed.        Thought Content: Thought content includes suicidal ideation. Thought content includes suicidal plan.        Cognition and Memory: Memory is impaired.        Judgment: Judgment is impulsive.   Review of Systems  Constitutional: Negative.   HENT: Negative.    Eyes: Negative.   Respiratory: Negative.    Cardiovascular: Negative.   Gastrointestinal: Negative.   Musculoskeletal: Negative.   Skin: Negative.   Neurological:  Positive for sensory change and focal weakness.  Psychiatric/Behavioral:  Positive for depression, substance abuse and suicidal ideas. The patient is nervous/anxious and has insomnia.   Blood pressure 113/81, pulse 79, temperature 98.9 F (37.2 C), temperature source  Oral, resp. rate 16, height 5\' 6"  (1.676 m), weight 77.1 kg, SpO2 96 %. Body mass index is 27.43 kg/m.  Treatment Plan Summary: Medication management and Plan patient will be restarted on his medications for his diabetes his high blood pressure his hyperlipidemia.  He will also be continued on Eliquis for the blood clot newly discovered in his foot.  Thought mirtazapine for depression.  Continue 15-minute checks.  Social work will perform a full evaluation.  Engage patient in individual  and group therapy.  Ongoing reassessment of mood symptoms and suicidal ideation while working on appropriate discharge planning.  Suggestion of diabetes coordinator placed on glycemic control orders which has been done.  Observation Level/Precautions:  15 minute checks  Laboratory:  Chemistry Profile  Psychotherapy:    Medications:    Consultations:    Discharge Concerns:    Estimated LOS:  Other:     Physician Treatment Plan for Primary Diagnosis: Severe episode of recurrent major depressive disorder, without psychotic features ([place]) Long Term Goal(s): Improvement in symptoms so as ready for discharge  Short Term Goals: Ability to verbalize feelings will improve and Ability to disclose and discuss suicidal ideas  Physician Treatment Plan for Secondary Diagnosis: Principal Problem:   Severe episode of recurrent major depressive disorder, without psychotic features ([place]) Active Problems:   CAD (coronary artery disease)   HTN (hypertension)   Hyperlipidemia   Diabetes mellitus, type II ([place])   Alcohol use disorder, moderate, dependence ([place])   MDD (major depressive disorder)   Deep vein thrombosis (DVT) ([place])  Long Term Goal(s): Improvement in symptoms so as ready for discharge  Short Term Goals: Ability to maintain clinical measurements within normal limits will improve and Compliance with prescribed medications will improve  I certify that inpatient services furnished can reasonably be expected to  improve the patient's condition.    Alethia Berthold, MD 9/23/202211:15 AM

## 2020-12-15 NOTE — Progress Notes (Signed)
Recreation Therapy Notes  INPATIENT RECREATION TR PLAN  Patient Details Name: Christian [name] MRN: [id number] DOB: 1970-08-25 Today's Date: 12/15/2020  Rec Therapy Plan Is patient appropriate for Therapeutic Recreation?: Yes Treatment times per week: at least 3 Estimated Length of Stay: 5-7 days TR Treatment/Interventions: Group participation (Comment)  Discharge Criteria Pt will be discharged from therapy if:: Discharged Treatment plan/goals/alternatives discussed and agreed upon by:: Patient/family  Discharge Summary     [name] 12/15/2020, 3:07 PM

## 2020-12-15 NOTE — Progress Notes (Signed)
Patient is calm and cooperative with assessment. He reports feeling depressed and hopeless because he does not have any support or anything to live for. Patient states that his family has given up on him. Patient endorses passive SI. He denies HI and AVH. Patient interacts appropriately with staff and other patients on the unit. Patient remains safe on the unit at this time.

## 2020-12-15 NOTE — Group Note (Addendum)
[facility] LCSW Group Therapy Note   Group Date: 12/15/2020 Start Time: 1300 End Time: 1400  Type of Therapy and Topic:  Group Therapy:  Feelings around Relapse and Recovery  Participation Level:  Active   Mood: Euthymic  Description of Group:    Patients in this group will discuss emotions they experience before and after a relapse. They will process how experiencing these feelings, or avoidance of experiencing them, relates to having a relapse. Facilitator will guide patients to explore emotions they have related to recovery. Patients will be encouraged to process which emotions are more powerful. They will be guided to discuss the emotional reaction significant others in their lives may have to patients' relapse or recovery. Patients will be assisted in exploring ways to respond to the emotions of others without this contributing to a relapse.  Therapeutic Goals: Patient will identify two or more emotions that lead to relapse for them:  Patient will identify two emotions that result when they relapse:  Patient will identify two emotions related to recovery:  Patient will demonstrate ability to communicate their needs through discussion and/or role plays.   Summary of Patient Progress: Patient was present for the entirety of the group session. Patient was an active listener and participated in the topic of discussion, provided helpful advice to others, and added nuance to topic of conversation. Patient shared that his alcohol use has become instinctual and that he spends excessively to support his drinking habit. Patient is currently contemplating treatment options and motivation to change.    Therapeutic Modalities:   Cognitive Behavioral Therapy Solution-Focused Therapy Assertiveness Training Relapse Prevention Therapy   Durenda Hurt, [place]

## 2020-12-15 NOTE — Progress Notes (Signed)
   12/15/20 1210  Clinical Encounter Type  Visited With Patient  Visit Type Follow-up;Spiritual support;Social support  Referral From Other (Comment) (rounding)  Spiritual Encounters  Spiritual Needs Other (Comment) (social support)  Chaplain greeted Pt who she met in the ED /22. Pt indicated he had rested well and that he was utilizing walker for mobility support. Chaplain offered her general support for Pt's well-being and communicated her ongoing availability. Will continue to follow.

## 2020-12-15 NOTE — [facility] Suicide Risk Assessment (Signed)
[facility] Admission Suicide Risk Assessment   Nursing information obtained from:  Patient Demographic factors:  Male, Caucasian Current Mental Status:  Suicidal ideation indicated by patient Loss Factors:  Loss of significant relationship Historical Factors:  Impulsivity Risk Reduction Factors:  Positive therapeutic relationship  Total Time spent with patient: 1 hour Principal Problem: Severe episode of recurrent major depressive disorder, without psychotic features ([place]) Diagnosis:  Principal Problem:   Severe episode of recurrent major depressive disorder, without psychotic features ([place]) Active Problems:   CAD (coronary artery disease)   HTN (hypertension)   Hyperlipidemia   Diabetes mellitus, type II (HCC)   Alcohol use disorder, moderate, dependence ([place])   MDD (major depressive disorder)   Deep vein thrombosis (DVT) ([place])  Subjective Data: Patient seen and chart reviewed.  50 year old man well known to the psychiatric service with a history of recurrent severe depression.  Patient is having active suicidal thoughts with multiple symptoms of depression.  No active psychosis however at this point and is cooperative with treatment.  Expresses a willingness to engage with appropriate medical and mental health treatment on the unit.  Continued Clinical Symptoms:  Alcohol Use Disorder Identification Test Final Score (AUDIT): 7 The "Alcohol Use Disorders Identification Test", Guidelines for Use in Primary Care, Second Edition.  World Pharmacologist [facility]). Score between 0-7:  no or low risk or alcohol related problems. Score between 8-15:  moderate risk of alcohol related problems. Score between 16-19:  high risk of alcohol related problems. Score 20 or above:  warrants further diagnostic evaluation for alcohol dependence and treatment.   CLINICAL FACTORS:   Depression:   Comorbid alcohol abuse/dependence Alcohol/Substance Abuse/Dependencies   Musculoskeletal: Strength & Muscle  Tone: decreased Gait & Station: ataxic Patient leans: N/A  Psychiatric Specialty Exam:  Presentation  General Appearance: Appropriate for Environment  Eye Contact:Good  Speech:Clear and Coherent  Speech Volume:Normal  Handedness: No data recorded  Mood and Affect  Mood:Depressed; Dysphoric  Affect:Appropriate; Congruent   Thought Process  Thought Processes:Coherent  Descriptions of Associations:Intact  Orientation:Full (Time, Place and Person)  Thought Content:Logical  History of Schizophrenia/Schizoaffective disorder:No data recorded Duration of Psychotic Symptoms:No data recorded Hallucinations:Hallucinations: Auditory Description of Auditory Hallucinations: "muffled voices"  Ideas of Reference:None  Suicidal Thoughts:Suicidal Thoughts: Yes, Active  Homicidal Thoughts:Homicidal Thoughts: Yes, Passive   Sensorium  Memory:Immediate Good  Judgment:Impaired  Insight:Fair   Executive Functions  Concentration:Fair  Attention Span:Good  [place]   Psychomotor Activity  Psychomotor Activity:Psychomotor Activity: Normal   Assets  Assets:Communication Skills; Desire for Improvement   Sleep  Sleep:Sleep: Fair    Physical Exam: Physical Exam Vitals and nursing note reviewed.  Constitutional:      Appearance: Normal appearance.  HENT:     Head: Normocephalic and atraumatic.     Mouth/Throat:     Pharynx: Oropharynx is clear.  Eyes:     Pupils: Pupils are equal, round, and reactive to light.  Cardiovascular:     Rate and Rhythm: Normal rate and regular rhythm.  Pulmonary:     Effort: Pulmonary effort is normal.     Breath sounds: Normal breath sounds.  Abdominal:     General: Abdomen is flat.     Palpations: Abdomen is soft.  Musculoskeletal:        General: Normal range of motion.  Skin:    General: Skin is warm and dry.  Neurological:     General: No focal deficit present.     Mental  Status:  He is alert. Mental status is at baseline.     Motor: Weakness present.     Coordination: Coordination abnormal.     Gait: Gait abnormal.     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        Mood and Affect: Mood is depressed. Affect is blunt.        Speech: Speech is delayed.        Behavior: Behavior is slowed.        Thought Content: Thought content includes suicidal ideation. Thought content includes suicidal plan.        Cognition and Memory: Cognition is impaired.        Judgment: Judgment is impulsive.   Review of Systems  Constitutional: Negative.   HENT: Negative.    Eyes: Negative.   Respiratory: Negative.    Cardiovascular: Negative.   Gastrointestinal: Negative.   Musculoskeletal: Negative.   Skin: Negative.   Neurological:  Positive for sensory change and weakness.  Psychiatric/Behavioral:  Positive for depression, memory loss, substance abuse and suicidal ideas. Negative for hallucinations. The patient is nervous/anxious and has insomnia.   Blood pressure 113/81, pulse 79, temperature 98.9 F (37.2 C), temperature source Oral, resp. rate 16, height 5\' 6"  (1.676 m), weight 77.1 kg, SpO2 96 %. Body mass index is 27.43 kg/m.   COGNITIVE FEATURES THAT CONTRIBUTE TO RISK:  None    SUICIDE RISK:   Moderate:  Frequent suicidal ideation with limited intensity, and duration, some specificity in terms of plans, no associated intent, good self-control, limited dysphoria/symptomatology, some risk factors present, and identifiable protective factors, including available and accessible social support.  PLAN OF CARE: Continue on 15-minute checks.  Restart medicine for depression as well as for medical problems.  Social work will do full assessment.  Ongoing reassessment of suicidal ideation prior to discharge  I certify that inpatient services furnished can reasonably be  expected to improve the patient's condition.   Alethia Berthold, MD 12/15/2020, 11:12 AM

## 2020-12-16 DIAGNOSIS — F339 Major depressive disorder, recurrent, unspecified: Secondary | ICD-10-CM

## 2020-12-16 LAB — GLUCOSE, CAPILLARY
Glucose-Capillary: 230 mg/dL — ABNORMAL HIGH (ref 70–99)
Glucose-Capillary: 279 mg/dL — ABNORMAL HIGH (ref 70–99)
Glucose-Capillary: 233 mg/dL — ABNORMAL HIGH (ref 70–99)
Glucose-Capillary: 293 mg/dL — ABNORMAL HIGH (ref 70–99)

## 2020-12-16 MED ORDER — TAB-A-VITE/IRON PO TABS
1.0000 | ORAL_TABLET | Freq: Every day | ORAL | Status: DC
  Administered 2020-12-16 – 2020-12-21 (×6): 1 via ORAL
  Filled 2020-12-16 (×6): qty 1

## 2020-12-16 NOTE — Group Note (Signed)
[facility] LCSW Group Therapy Note   Group Date: 12/16/2020 Start Time: 1300 End Time: 1400   Type of Therapy/Topic:  Group Therapy:  Balance in Life  Participation Level:  Active   Description of Group:    This group will address the concept of balance and how it feels and looks when one is unbalanced. Patients will be encouraged to process areas in their lives that are out of balance, and identify reasons for remaining unbalanced. Facilitators will guide patients utilizing problem- solving interventions to address and correct the stressor making their life unbalanced. Understanding and applying boundaries will be explored and addressed for obtaining  and maintaining a balanced life. Patients will be encouraged to explore ways to assertively make their unbalanced needs known to significant others in their lives, using other group members and facilitator for support and feedback.  Therapeutic Goals: Patient will identify two or more emotions or situations they have that consume much of in their lives. Patient will identify signs/triggers that life has become out of balance:  Patient will identify two ways to set boundaries in order to achieve balance in their lives:  Patient will demonstrate ability to communicate their needs through discussion and/or role plays  Summary of Patient Progress: Patient was present for the entirety of group and actively participated in discussion. Patient identified physical limitations and alcohol use as barriers that have gotten in the way of his recovery. Patient shared how he participated in a group [facility] in the past. Patient shared how attending [place] meetings was a positive support in the past. Patient stated that transportation was a barrier. CSW asked patient if he has considered asking for rides from other AA members in the event he would like to resume attending meetings. Patient additionally identified [facility] as a possible future support.     Therapeutic  Modalities:   Cognitive Behavioral Therapy Solution-Focused Therapy Assertiveness Training   Berniece Salines, Latanya Presser 12/16/2020 3:43pm

## 2020-12-16 NOTE — Progress Notes (Addendum)
[facility] MD Progress Note  12/16/2020 10:28 AM Christian [name]  MRN:  [id number] Subjective:  Patient evaluated in his room this morning, where he has been sleeping after breakfast. He has been a bit unsteady on his legs and was given a  walker to use. He has been compliant with his medicines and slept well last night. He is reporting depressed mood, some anxiety and feeling tired. He is denying current withdrawals or suicidal ideations.  Nursing Report : Patient is calm and cooperative with assessment. He reports feeling depressed and hopeless because he does not have any support or anything to live for. Patient states that his family has given up on him. Patient endorses passive SI. He denies HI and AVH. Patient interacts appropriately with staff and other patients on the unit. Principal Problem: Severe episode of recurrent major depressive disorder, without psychotic features ([place]) Diagnosis: Principal Problem:   Severe episode of recurrent major depressive disorder, without psychotic features ([place]) Active Problems:   CAD (coronary artery disease)   HTN (hypertension)   Hyperlipidemia   Diabetes mellitus, type II (HCC)   Alcohol use disorder, moderate, dependence ([place])   MDD (major depressive disorder)   Deep vein thrombosis (DVT) ([place])  Total Time spent with patient: 20 minutes  Past Psychiatric History: several prior hospitalizations and a history of chronic depression.  He has had at least one serious suicide attempt in the past.  History of intermittent alcohol abuse  Past Medical History:  Past Medical History:  Diagnosis Date   CAD (coronary artery disease)    a. 07/2013 NSTEMI/PCI: LCX 78m (4.0x23 Xience DES), RCA 95m, EF > 55%;  b. 07/2013 Echo: EF 60-65%, diast dysfxn, mild LVH, mildly dil LA, nl RV size/fxn, nl RVSP.   Diabetes mellitus without complication ([place])    History of tobacco abuse    a. quit ~ 2010   Hyperlipidemia    Morbid obesity ([place])    a. weighed 168 @ age 18.    Scoliosis    Stroke [facility])     Past Surgical History:  Procedure Laterality Date   ADENOIDECTOMY     CARDIAC CATHETERIZATION  07/2013   [facility]'x1 stent   Family History:  Family History  Problem Relation Age of Onset   Heart disease Mother    Esophageal cancer Father    Diabetes Sister    Family Psychiatric  History: None Social History:  Social History   Substance and Sexual Activity  Alcohol Use Yes   Comment: Drink half a bottle of vodka last Thursday.     Social History   Substance and Sexual Activity  Drug Use No   Types: Marijuana   Comment: past    Social History   Socioeconomic History   Marital status: Single    Spouse name: Not on file   Number of children: Not on file   Years of education: Not on file   Highest education level: Not on file  Occupational History   Not on file  Tobacco Use   Smoking status: Former    Types: Cigarettes   Smokeless tobacco: Never  Vaping Use   Vaping Use: Never used  Substance and Sexual Activity   Alcohol use: Yes    Comment: Drink half a bottle of vodka last Thursday.   Drug use: No    Types: Marijuana    Comment: past   Sexual activity: Not on file  Other Topics Concern   Not on file  Social History Narrative  Not on file   Social Determinants of Health   Financial Resource Strain: Not on file  Food Insecurity: Not on file  Transportation Needs: Not on file  Physical Activity: Not on file  Stress: Not on file  Social Connections: Not on file   Additional Social History:                         Sleep: Good  Appetite:  Fair  Current Medications: Current Facility-Administered Medications  Medication Dose Route Frequency Provider Last Rate Last Admin   acetaminophen (TYLENOL) tablet 650 mg  650 mg Oral Q6H PRN Sherlon Handing, NP       alum & mag hydroxide-simeth (MAALOX/MYLANTA) 200-200-20 MG/5ML suspension 30 mL  30 mL Oral Q4H PRN Sherlon Handing, NP       apixaban (ELIQUIS) tablet  10 mg  10 mg Oral BID Waldon Merl F, NP   10 mg at 12/16/20 0745   [START ON 12/21/2020] apixaban (ELIQUIS) tablet 5 mg  5 mg Oral BID Waldon Merl F, NP       atorvastatin (LIPITOR) tablet 40 mg  40 mg Oral Daily Clapacs, John T, MD   40 mg at 12/16/20 0745   carvedilol (COREG) tablet 25 mg  25 mg Oral BID WC Waldon Merl F, NP   25 mg at 12/16/20 0745   insulin aspart (novoLOG) injection 0-5 Units  0-5 Units Subcutaneous QHS Clapacs, John T, MD   3 Units at 12/15/20 2135   insulin aspart (novoLOG) injection 0-9 Units  0-9 Units Subcutaneous TID WC Clapacs, Madie Reno, MD   3 Units at 12/16/20 0745   magnesium hydroxide (MILK OF MAGNESIA) suspension 30 mL  30 mL Oral Daily PRN Waldon Merl F, NP       metFORMIN (GLUCOPHAGE) tablet 1,000 mg  1,000 mg Oral BID WC Waldon Merl F, NP   1,000 mg at 12/16/20 0745   mirtazapine (REMERON) tablet 30 mg  30 mg Oral QHS Clapacs, Madie Reno, MD   30 mg at 12/15/20 2139    Lab Results:  Results for orders placed or performed during the hospital encounter of 12/14/20 (from the past 48 hour(s))  Hemoglobin A1c     Status: Abnormal   Collection Time: 12/15/20 11:15 AM  Result Value Ref Range   Hgb A1c MFr Bld 9.0 (H) 4.8 - 5.6 %    Comment: (NOTE)         Prediabetes: 5.7 - 6.4         Diabetes: >6.4         Glycemic control for adults with diabetes: <7.0    Mean Plasma Glucose 212 mg/dL    Comment: (NOTE) Performed At: [facility] [address] [place], [place] [id number] Rush Farmer MD OI:[id number]   Glucose, capillary     Status: Abnormal   Collection Time: 12/15/20 11:25 AM  Result Value Ref Range   Glucose-Capillary 434 (H) 70 - 99 mg/dL    Comment: Glucose reference range applies only to samples taken after fasting for at least 8 hours.  Glucose, capillary     Status: Abnormal   Collection Time: 12/15/20 11:30 AM  Result Value Ref Range   Glucose-Capillary 390 (H) 70 - 99 mg/dL    Comment: Glucose reference range  applies only to samples taken after fasting for at least 8 hours.  Glucose, capillary     Status: Abnormal   Collection Time: 12/15/20  4:27  PM  Result Value Ref Range   Glucose-Capillary 215 (H) 70 - 99 mg/dL    Comment: Glucose reference range applies only to samples taken after fasting for at least 8 hours.   Comment 1 Notify RN   Glucose, capillary     Status: Abnormal   Collection Time: 12/15/20  9:33 PM  Result Value Ref Range   Glucose-Capillary 260 (H) 70 - 99 mg/dL    Comment: Glucose reference range applies only to samples taken after fasting for at least 8 hours.  Glucose, capillary     Status: Abnormal   Collection Time: 12/16/20  7:04 AM  Result Value Ref Range   Glucose-Capillary 230 (H) 70 - 99 mg/dL    Comment: Glucose reference range applies only to samples taken after fasting for at least 8 hours.   Comment 1 Notify RN     Blood Alcohol level:  Lab Results  Component Value Date   ETH <10 12/14/2020   ETH <10 29/52/8413    Metabolic Disorder Labs: Lab Results  Component Value Date   HGBA1C 9.0 (H) 12/15/2020   MPG 212 12/15/2020   MPG 237.43 08/21/2019   No results found for: PROLACTIN Lab Results  Component Value Date   CHOL 126 10/20/2019   TRIG 175 (H) 10/20/2019   HDL 37 (L) 10/20/2019   CHOLHDL 3.4 10/20/2019   VLDL 42 (H) 08/21/2019   LDLCALC 60 10/20/2019   LDLCALC 85 08/21/2019    Physical Findings: AIMS:  , ,  ,  ,    CIWA:    COWS:     Musculoskeletal: Strength & Muscle Tone: decreased Gait & Station: unsteady Patient leans: N/A  Psychiatric Specialty Exam:  Presentation  General Appearance: Appropriate for Environment  Eye Contact:Good  Speech:Clear and Coherent  Speech Volume:Decreased   Handedness: No data recorded  Mood and Affect  Mood:Depressed; Dysphoric  Affect:Appropriate; Congruent   Thought Process  Thought Processes:Coherent  Descriptions of Associations:Intact  Orientation:Full (Time, Place and  Person)  Thought Content:Logical  History of Schizophrenia/Schizoaffective disorder:No data recorded Duration of Psychotic Symptoms:No data recorded Hallucinations:Denies  Ideas of Reference:None  Suicidal Thoughts:Denies at this time  Homicidal Thoughts:No data recorded  Sensorium  Memory:Immediate Good  Judgment:Impaired  Insight:Fair   Executive Functions  Concentration:Fair  Attention Span:Good  [place]   Psychomotor Activity  Psychomotor Activity: No data recorded  Assets  Assets:Communication Skills; Desire for Improvement   Sleep  Sleep: No data recorded   Physical Exam: Physical Exam Constitutional:      Appearance: Normal appearance.  HENT:     Head: Normocephalic and atraumatic.     Nose: Nose normal.     Mouth/Throat:     Mouth: Mucous membranes are moist.  Eyes:     Extraocular Movements: Extraocular movements intact.     Conjunctiva/sclera: Conjunctivae normal.     Pupils: Pupils are equal, round, and reactive to light.  Cardiovascular:     Rate and Rhythm: Normal rate and regular rhythm.  Pulmonary:     Effort: Pulmonary effort is normal.     Breath sounds: Normal breath sounds.  Abdominal:     General: Abdomen is flat. Bowel sounds are normal.     Palpations: Abdomen is soft.  Musculoskeletal:        General: Normal range of motion.     Cervical back: Normal range of motion.  Skin:    General: Skin is warm and dry.  Neurological:  General: No focal deficit present.   Review of Systems  Constitutional:  Positive for malaise/fatigue.  HENT: Negative.    Eyes: Negative.   Respiratory: Negative.    Cardiovascular: Negative.   Gastrointestinal: Negative.   Genitourinary: Negative.   Musculoskeletal:  Positive for joint pain.  Skin: Negative.   Neurological: Negative.   Endo/Heme/Allergies: Negative.   Psychiatric/Behavioral:  Positive for depression and substance abuse. The  patient is nervous/anxious.   Blood pressure (!) 154/96, pulse 74, temperature 98.8 F (37.1 C), temperature source Oral, resp. rate 16, height 5\' 6"  (1.676 m), weight 77.1 kg, SpO2 99 %. Body mass index is 27.43 kg/m.   Treatment Plan Summary: Daily contact with patient to assess and evaluate symptoms and progress in treatment, Medication management, and Plan : Continue Remeron for Depression   Cierah Crader 12/16/2020, 10:28 AM

## 2020-12-16 NOTE — Progress Notes (Signed)
Patient is alert and oriented x4. Patient denies SI, HI, and AVH at the time of assessment. He does not voice any complaints. He denies pain.  Patient states he slept well and appetite is good. He interacts appropriately with staff and other patients on the unit. Patient is medication compliant. Support, encouragement, and education is provided. Patient remains safe on the unit at this time.

## 2020-12-17 LAB — CBC
HCT: 41.8 % (ref 39.0–52.0)
Hemoglobin: 14.9 g/dL (ref 13.0–17.0)
MCH: 32.8 pg (ref 26.0–34.0)
MCHC: 35.6 g/dL (ref 30.0–36.0)
MCV: 92.1 fL (ref 80.0–100.0)
Platelets: 169 10*3/uL (ref 150–400)
RBC: 4.54 MIL/uL (ref 4.22–5.81)
RDW: 15.4 % (ref 11.5–15.5)
WBC: 7.2 10*3/uL (ref 4.0–10.5)
nRBC: 0 % (ref 0.0–0.2)

## 2020-12-17 LAB — GLUCOSE, CAPILLARY
Glucose-Capillary: 251 mg/dL — ABNORMAL HIGH (ref 70–99)
Glucose-Capillary: 239 mg/dL — ABNORMAL HIGH (ref 70–99)
Glucose-Capillary: 217 mg/dL — ABNORMAL HIGH (ref 70–99)
Glucose-Capillary: 203 mg/dL — ABNORMAL HIGH (ref 70–99)

## 2020-12-17 NOTE — Group Note (Signed)
LCSW Group Therapy Note  Group Date: 12/17/2020 Start Time: 1300 End Time: 1400   Type of Therapy and Topic:  Group Therapy - How To Cope with Nervousness about Discharge   Participation Level:  Active   Description of Group This process group involved identification of patients' feelings about discharge. Some of them are scheduled to be discharged soon, while others are new admissions, but each of them was asked to share thoughts and feelings surrounding discharge from the hospital. One common theme was that they are excited at the prospect of going home, while another was that many of them are apprehensive about sharing why they were hospitalized. Patients were given the opportunity to discuss these feelings with their peers in preparation for discharge.  Therapeutic Goals  Patient will identify their overall feelings about pending discharge. Patient will think about how they might proactively address issues that they believe will once again arise once they get home (i.e. with parents). Patients will participate in discussion about having hope for change.   Summary of Patient Progress: Patient was present for the entirety of the group session. Patient was an active listener and participated in the topic of discussion, provided helpful advice to others, and added nuance to topic of conversation. Patient shared how having 2 strokes has limited his functioning. 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 Patient shared how he wants to pursue applying for disability. Patient expressed how he is feeling hesitant to leave the hospital due to him failing to appear in court last week. This CSW and patient discussed patient contacting his public defender to resolve this concern after the end of group. Patient also shared how he is food insecure, as his EBT benefits ended in March 2022. Patient shared that he is interested in  receiving mental health services at [facility] and plans to talk to the peer support specialist, Lanae Boast this coming week to discuss possible treatment options.     Therapeutic Modalities Cognitive Behavioral Therapy   Sherilyn Dacosta 12/17/2020  2:38 PM

## 2020-12-17 NOTE — Progress Notes (Signed)
Patient has been pleasant and cooperative. Admits to some passive fleeting SI earlier in the day but currently denies and contracts for safety. Denies HI and AVH

## 2020-12-17 NOTE — Progress Notes (Signed)
Pt is alert and oriented to person, place, time and situation. Pt is calm, cooperative, pleasant upon approach, denies SI/HI/AVH. Pt's affect is flat, eye contact is good, also denies feelings of anxiety and depression. Pt is medication complaint. Pt attended outdoor recreational group with [facility], appetite is good, reports he slept well last night. Will continue to monitor pt per Q15 minute face checks and monitor for safety and progress.

## 2020-12-17 NOTE — Progress Notes (Signed)
[facility] MD Progress Note  12/17/2020 10:05 AM Christian [name]  MRN:  [id number] Subjective:  Patient has been calm, compliant with his medicines and is pleasant upon approach. He reports feeling better today with some reduction in his depression and anxiety. He is worried about his DWI case and plans to speak to his attorney tomorrow. He slept well last night, has improved appetite and is denying any SI today.  Group Note : Patient was present for the entirety of group and actively participated in discussion. Patient identified physical limitations and alcohol use as barriers that have gotten in the way of his recovery. Patient shared how he participated in a group at [facility] in the past. Patient shared how attending [facility] meetings was a positive support in the past. Patient stated that transportation was a barrier. CSW asked patient if he has considered asking for rides from other AA members in the event he would like to resume attending meetings. Patient additionally identified [facility] as a possible future support. Nursing Report : Patient has been pleasant and cooperative. Admits to some passive fleeting SI earlier in the day but currently denies and contracts for safety. Denies HI and AVH Principal Problem: Severe episode of recurrent major depressive disorder, without psychotic features ([place]) Diagnosis: Principal Problem:   Severe episode of recurrent major depressive disorder, without psychotic features ([place]) Active Problems:   CAD (coronary artery disease)   HTN (hypertension)   Hyperlipidemia   Diabetes mellitus, type II (HCC)   Alcohol use disorder, moderate, dependence ([place])   MDD (major depressive disorder)   Deep vein thrombosis (DVT) ([place])  Total Time spent with patient: 20 minutes  Past Psychiatric History: several prior hospitalizations and a history of chronic depression.  He has had at least one serious suicide attempt in the past.  History of intermittent alcohol abuse  Past Medical History:   Past Medical History:  Diagnosis Date   CAD (coronary artery disease)    a. 07/2013 NSTEMI/PCI: LCX 90m (4.0x23 Xience DES), RCA 60m, EF > 55%;  b. 07/2013 Echo: EF 60-65%, diast dysfxn, mild LVH, mildly dil LA, nl RV size/fxn, nl RVSP.   Diabetes mellitus without complication ([place])    History of tobacco abuse    a. quit ~ 2010   Hyperlipidemia    Morbid obesity ([place])    a. weighed 168 @ age 38.   Scoliosis    Stroke [facility])     Past Surgical History:  Procedure Laterality Date   ADENOIDECTOMY     CARDIAC CATHETERIZATION  07/2013   [facility]'x1 stent   Family History:  Family History  Problem Relation Age of Onset   Heart disease Mother    Esophageal cancer Father    Diabetes Sister    Family Psychiatric  History: None Social History:  Social History   Substance and Sexual Activity  Alcohol Use Yes   Comment: Drink half a bottle of vodka last Thursday.     Social History   Substance and Sexual Activity  Drug Use No   Types: Marijuana   Comment: past    Social History   Socioeconomic History   Marital status: Single    Spouse name: Not on file   Number of children: Not on file   Years of education: Not on file   Highest education level: Not on file  Occupational History   Not on file  Tobacco Use   Smoking status: Former    Types: Cigarettes   Smokeless tobacco: Never  Vaping Use   Vaping Use: Never used  Substance and Sexual Activity   Alcohol use: Yes    Comment: Drink half a bottle of vodka last Thursday.   Drug use: No    Types: Marijuana    Comment: past   Sexual activity: Not on file  Other Topics Concern   Not on file  Social History Narrative   Not on file   Social Determinants of Health   Financial Resource Strain: Not on file  Food Insecurity: Not on file  Transportation Needs: Not on file  Physical Activity: Not on file  Stress: Not on file  Social Connections: Not on file   Additional Social History:                          Sleep: Good  Appetite:  Fair  Current Medications: Current Facility-Administered Medications  Medication Dose Route Frequency Provider Last Rate Last Admin   acetaminophen (TYLENOL) tablet 650 mg  650 mg Oral Q6H PRN Sherlon Handing, NP       alum & mag hydroxide-simeth (MAALOX/MYLANTA) 200-200-20 MG/5ML suspension 30 mL  30 mL Oral Q4H PRN Sherlon Handing, NP       apixaban (ELIQUIS) tablet 10 mg  10 mg Oral BID Waldon Merl F, NP   10 mg at 12/17/20 0818   [START ON 12/21/2020] apixaban (ELIQUIS) tablet 5 mg  5 mg Oral BID Waldon Merl F, NP       atorvastatin (LIPITOR) tablet 40 mg  40 mg Oral Daily Clapacs, John T, MD   40 mg at 12/17/20 0818   carvedilol (COREG) tablet 25 mg  25 mg Oral BID WC Waldon Merl F, NP   25 mg at 12/17/20 9233   insulin aspart (novoLOG) injection 0-5 Units  0-5 Units Subcutaneous QHS Clapacs, John T, MD   3 Units at 12/16/20 2101   insulin aspart (novoLOG) injection 0-9 Units  0-9 Units Subcutaneous TID WC Clapacs, Madie Reno, MD   5 Units at 12/17/20 0730   magnesium hydroxide (MILK OF MAGNESIA) suspension 30 mL  30 mL Oral Daily PRN Waldon Merl F, NP       metFORMIN (GLUCOPHAGE) tablet 1,000 mg  1,000 mg Oral BID WC Waldon Merl F, NP   1,000 mg at 12/17/20 0820   mirtazapine (REMERON) tablet 30 mg  30 mg Oral QHS Clapacs, John T, MD   30 mg at 12/16/20 2102   multivitamins with iron tablet 1 tablet  1 tablet Oral Daily Antonieta Pert   1 tablet at 12/17/20 0076    Lab Results:  Results for orders placed or performed during the hospital encounter of 12/14/20 (from the past 48 hour(s))  Hemoglobin A1c     Status: Abnormal   Collection Time: 12/15/20 11:15 AM  Result Value Ref Range   Hgb A1c MFr Bld 9.0 (H) 4.8 - 5.6 %    Comment: (NOTE)         Prediabetes: 5.7 - 6.4         Diabetes: >6.4         Glycemic control for adults with diabetes: <7.0    Mean Plasma Glucose 212 mg/dL    Comment: (NOTE) Performed At: [facility] [address] [place], [place] [id number] Rush Farmer MD GY:[id number]   Glucose, capillary     Status: Abnormal   Collection Time: 12/15/20 11:25 AM  Result Value Ref Range   Glucose-Capillary 434 (  H) 70 - 99 mg/dL    Comment: Glucose reference range applies only to samples taken after fasting for at least 8 hours.  Glucose, capillary     Status: Abnormal   Collection Time: 12/15/20 11:30 AM  Result Value Ref Range   Glucose-Capillary 390 (H) 70 - 99 mg/dL    Comment: Glucose reference range applies only to samples taken after fasting for at least 8 hours.  Glucose, capillary     Status: Abnormal   Collection Time: 12/15/20  4:27 PM  Result Value Ref Range   Glucose-Capillary 215 (H) 70 - 99 mg/dL    Comment: Glucose reference range applies only to samples taken after fasting for at least 8 hours.   Comment 1 Notify RN   Glucose, capillary     Status: Abnormal   Collection Time: 12/15/20  9:33 PM  Result Value Ref Range   Glucose-Capillary 260 (H) 70 - 99 mg/dL    Comment: Glucose reference range applies only to samples taken after fasting for at least 8 hours.  Glucose, capillary     Status: Abnormal   Collection Time: 12/16/20  7:04 AM  Result Value Ref Range   Glucose-Capillary 230 (H) 70 - 99 mg/dL    Comment: Glucose reference range applies only to samples taken after fasting for at least 8 hours.   Comment 1 Notify RN   Glucose, capillary     Status: Abnormal   Collection Time: 12/16/20 11:16 AM  Result Value Ref Range   Glucose-Capillary 279 (H) 70 - 99 mg/dL    Comment: Glucose reference range applies only to samples taken after fasting for at least 8 hours.  Glucose, capillary     Status: Abnormal   Collection Time: 12/16/20  4:27 PM  Result Value Ref Range   Glucose-Capillary 233 (H) 70 - 99 mg/dL    Comment: Glucose reference range applies only to samples taken after fasting for at least 8 hours.  Glucose, capillary     Status: Abnormal   Collection  Time: 12/16/20  8:20 PM  Result Value Ref Range   Glucose-Capillary 293 (H) 70 - 99 mg/dL    Comment: Glucose reference range applies only to samples taken after fasting for at least 8 hours.  CBC     Status: None   Collection Time: 12/17/20  6:13 AM  Result Value Ref Range   WBC 7.2 4.0 - 10.5 K/uL   RBC 4.54 4.22 - 5.81 MIL/uL   Hemoglobin 14.9 13.0 - 17.0 g/dL   HCT 41.8 39.0 - 52.0 %   MCV 92.1 80.0 - 100.0 fL   MCH 32.8 26.0 - 34.0 pg   MCHC 35.6 30.0 - 36.0 g/dL   RDW 15.4 11.5 - 15.5 %   Platelets 169 150 - 400 K/uL   nRBC 0.0 0.0 - 0.2 %    Comment: Performed at [facility], [address] [place], [place] [address]  Glucose, capillary     Status: Abnormal   Collection Time: 12/17/20  7:09 AM  Result Value Ref Range   Glucose-Capillary 251 (H) 70 - 99 mg/dL    Comment: Glucose reference range applies only to samples taken after fasting for at least 8 hours.    Blood Alcohol level:  Lab Results  Component Value Date   [facility] <10 12/14/2020   ETH <10 63/89/3734    Metabolic Disorder Labs: Lab Results  Component Value Date   HGBA1C 9.0 (H) 12/15/2020   MPG 212 12/15/2020  MPG 237.43 08/21/2019   No results found for: PROLACTIN Lab Results  Component Value Date   CHOL 126 10/20/2019   TRIG 175 (H) 10/20/2019   HDL 37 (L) 10/20/2019   CHOLHDL 3.4 10/20/2019   VLDL 42 (H) 08/21/2019   LDLCALC 60 10/20/2019   LDLCALC 85 08/21/2019    Physical Findings: AIMS:  , ,  ,  ,    CIWA:    COWS:     Musculoskeletal: Strength & Muscle Tone: decreased Gait & Station:  uses a walker Patient leans: Backward  Psychiatric Specialty Exam:  Presentation  General Appearance: Appropriate for Environment  Eye Contact:Good  Speech:Clear and Coherent  Speech Volume:Normal  Handedness: No data recorded  Mood and Affect  Mood:Depressed; Dysphoric  Affect:Appropriate; Congruent   Thought Process  Thought Processes:Coherent  Descriptions of  Associations:Intact  Orientation:Full (Time, Place and Person)  Thought Content:Logical  History of Schizophrenia/Schizoaffective disorder:No data recorded Duration of Psychotic Symptoms:No data recorded Hallucinations: Denies  Ideas of Reference:None  Suicidal Thoughts:Denies Homicidal Thoughts:Denies Sensorium  Memory:Immediate Good  Judgment:Impaired  Insight:Fair   Executive Functions  Concentration:Fair  Attention Span:Good  [place]   Psychomotor Activity  Psychomotor Activity: Reduced.   Assets  Assets:Communication Skills; Desire for Improvement   Sleep  Sleep: No data recorded   Physical Exam: Physical Exam Constitutional:      Appearance: Normal appearance. He is normal weight.  HENT:     Head: Normocephalic and atraumatic.     Nose: Nose normal.     Mouth/Throat:     Mouth: Mucous membranes are dry.     Pharynx: Oropharynx is clear.  Eyes:     Extraocular Movements: Extraocular movements intact.     Conjunctiva/sclera: Conjunctivae normal.     Pupils: Pupils are equal, round, and reactive to light.  Cardiovascular:     Rate and Rhythm: Normal rate and regular rhythm.     Pulses: Normal pulses.     Heart sounds: Normal heart sounds.  Pulmonary:     Effort: Pulmonary effort is normal.     Breath sounds: Normal breath sounds.  Abdominal:     General: Abdomen is flat. Bowel sounds are normal.     Palpations: Abdomen is soft.  Musculoskeletal:        General: Normal range of motion.     Cervical back: Normal range of motion and neck supple.  Skin:    General: Skin is warm and dry.  Neurological:     General: No focal deficit present.     Mental Status: He is alert.  Psychiatric:        Behavior: Behavior normal.   Review of Systems  Constitutional: Negative.   HENT: Negative.    Eyes: Negative.   Respiratory: Negative.    Cardiovascular: Negative.   Gastrointestinal: Negative.    Genitourinary: Negative.   Musculoskeletal:  Positive for joint pain.  Skin: Negative.   Neurological: Negative.   Endo/Heme/Allergies: Negative.   Psychiatric/Behavioral:  Positive for depression. The patient is nervous/anxious.   Blood pressure (!) 147/95, pulse 71, temperature 98.6 F (37 C), temperature source Oral, resp. rate 19, height 5\' 6"  (1.676 m), weight 77.1 kg, SpO2 99 %. Body mass index is 27.43 kg/m.   Treatment Plan Summary: Daily contact with patient to assess and evaluate symptoms and progress in treatment, Medication management, and Plan : Continue current Dose of Remeron  Shakerria Parran 12/17/2020, 10:05 AM

## 2020-12-17 NOTE — Plan of Care (Signed)
  Problem: Education: Goal: Knowledge of [facility] Education information/materials will improve Outcome: Progressing Goal: Emotional status will improve Outcome: Progressing Goal: Mental status will improve Outcome: Progressing Goal: Verbalization of understanding the information provided will improve Outcome: Progressing   Problem: Safety: Goal: Periods of time without injury will increase Outcome: Progressing   Problem: Education: Goal: Ability to make informed decisions regarding treatment will improve Outcome: Progressing   Problem: Medication: Goal: Compliance with prescribed medication regimen will improve Outcome: Progressing

## 2020-12-17 NOTE — [facility] Counselor (Signed)
This CSW met with patient one on one to discuss discharge planning. Patient states he used to receive food stamps via EBT but his benefits stopped in March, 2022. This CSW provided patient with contact information to reapply for EBT benefits.   Patient states he is additionally concerned about his failure to appear in court this past week due to being hospitalized. This CSW discussed ways to resolve this stressor prior to being discharged. Patient states he is motivated to contact his lawyer regarding his missed court date.  Patient states he has his public defender's business card located in his locker on the unit. This CSW notified patient's nurse that patient would like to get public defender's contact information out of his locker.  Patient additionally stated he plans to speak with peer support specialist, Lanae Boast from [place] during the week to discuss treatment options at [facility] to manage his mental health.   No additional concerns noted.   Idamae Lusher, MSW, [place], [place] 12/17/2020 2:37PM

## 2020-12-18 ENCOUNTER — Other Ambulatory Visit: Payer: Self-pay

## 2020-12-18 LAB — GLUCOSE, CAPILLARY
Glucose-Capillary: 213 mg/dL — ABNORMAL HIGH (ref 70–99)
Glucose-Capillary: 273 mg/dL — ABNORMAL HIGH (ref 70–99)
Glucose-Capillary: 210 mg/dL — ABNORMAL HIGH (ref 70–99)
Glucose-Capillary: 193 mg/dL — ABNORMAL HIGH (ref 70–99)

## 2020-12-18 MED ORDER — GLIMEPIRIDE 2 MG PO TABS
2.0000 mg | ORAL_TABLET | Freq: Every day | ORAL | Status: DC
  Administered 2020-12-18 – 2020-12-21 (×4): 2 mg via ORAL
  Filled 2020-12-18 (×5): qty 1

## 2020-12-18 MED ORDER — MIRTAZAPINE 30 MG PO TABS
30.0000 mg | ORAL_TABLET | Freq: Every day | ORAL | 0 refills | Status: DC
  Filled 2020-12-18: qty 7, 7d supply, fill #0

## 2020-12-18 MED ORDER — CARVEDILOL 25 MG PO TABS
25.0000 mg | ORAL_TABLET | Freq: Two times a day (BID) | ORAL | 0 refills | Status: DC
  Filled 2020-12-18: qty 14, 7d supply, fill #0

## 2020-12-18 MED ORDER — METFORMIN HCL 1000 MG PO TABS
1000.0000 mg | ORAL_TABLET | Freq: Two times a day (BID) | ORAL | 0 refills | Status: DC
  Filled 2020-12-18: qty 14, 7d supply, fill #0

## 2020-12-18 MED ORDER — ATORVASTATIN CALCIUM 40 MG PO TABS
40.0000 mg | ORAL_TABLET | Freq: Every day | ORAL | 0 refills | Status: DC
  Filled 2020-12-18: qty 7, 7d supply, fill #0

## 2020-12-18 MED ORDER — APIXABAN 5 MG PO TABS
5.0000 mg | ORAL_TABLET | Freq: Two times a day (BID) | ORAL | 0 refills | Status: DC
  Filled 2020-12-18: qty 14, 7d supply, fill #0

## 2020-12-18 NOTE — Progress Notes (Signed)
Recreation Therapy Notes  Date: 12/18/2020  Time: 10:00 am   Location: Courtyard    Behavioral response: N/A   Intervention Topic: Wellness   Discussion/Intervention: Patient did not attend group.   Clinical Observations/Feedback:  Patient did not attend group.   Kenta Laster LRT/CTRS        Laniah Grimm 12/18/2020 11:36 AM

## 2020-12-18 NOTE — Progress Notes (Signed)
Inpatient Diabetes Program Recommendations  AACE/ADA: New Consensus Statement on Inpatient Glycemic Control (2015)  Target Ranges:  Prepandial:   less than 140 mg/dL      Peak postprandial:   less than 180 mg/dL (1-2 hours)      Critically ill patients:  140 - 180 mg/dL   Lab Results  Component Value Date   GLUCAP 273 (H) 12/18/2020   HGBA1C 9.0 (H) 12/15/2020    Review of Glycemic Control Results for Christian [name], Christian [name] (MRN [id number]) as of 12/18/2020 14:02  Ref. Range 12/17/2020 07:09 12/17/2020 11:16 12/17/2020 16:31 12/17/2020 20:26 12/18/2020 06:51 12/18/2020 11:04  Glucose-Capillary Latest Ref Range: 70 - 99 mg/dL 251 (H) 239 (H) 217 (H) 203 (H) 213 (H) 273 (H)    Inpatient Diabetes Program Recommendations:   Please consider: Add Amaryl 2 mg qd to assist with meal coverage Secure chat sent to Dr. Domingo Cocking.  Thank you, Nani Gasser. Fawna Cranmer, RN, MSN, CDE  Diabetes Coordinator Inpatient Glycemic Control Team Team Pager [phone] (8am-5pm) 12/18/2020 2:03 PM

## 2020-12-18 NOTE — Progress Notes (Signed)
[facility] MD Progress Note  12/18/2020 12:14 PM Christian [name]  MRN:  [id number]  CC "Pretty down."  Subjective:  50 year old male presenting voluntarily for worsening depression and active suicidal thoughts. No acute events overnight, medication compliant, attending to ADLs. Patient seen one-on-one today. He notes that he has been feeling depressed since his mother passed away. His son has subsequently bought the house, and has told patient he will need to move out when he gets disability. This has caused him to feel fairly hopeless about life. He spend much of the day locked in his room watching daytime television, and also drinks when he has money to purchase alcohol. He missed court on 9/20 for DUI while in the hospital, and is worried about this. Have encouraged him to call his lawyer today. Have also encouraged him to contact his son to verify if he can return to the home or not at discharge. Patient is currently on Remeron 30 mg nightly and has noticed improved sleep and appetite. Depression and anxiety still present, but patient now passively suicidal as opposed to actively homicidal. Will continue current regimen.  Principal Problem: Severe episode of recurrent major depressive disorder, without psychotic features ([place]) Diagnosis: Principal Problem:   Severe episode of recurrent major depressive disorder, without psychotic features ([place]) Active Problems:   CAD (coronary artery disease)   HTN (hypertension)   Hyperlipidemia   Diabetes mellitus, type II (HCC)   Alcohol use disorder, moderate, dependence ([place])   MDD (major depressive disorder)   Deep vein thrombosis (DVT) ([place])  Total Time spent with patient: 30 minutes  Past Psychiatric History: See H&P  Past Medical History:  Past Medical History:  Diagnosis Date   CAD (coronary artery disease)    a. 07/2013 NSTEMI/PCI: LCX 71m (4.0x23 Xience DES), RCA 74m, EF > 55%;  b. 07/2013 Echo: EF 60-65%, diast dysfxn, mild LVH, mildly dil LA, nl RV  size/fxn, nl RVSP.   Diabetes mellitus without complication ([place])    History of tobacco abuse    a. quit ~ 2010   Hyperlipidemia    Morbid obesity ([place])    a. weighed 168 @ age 40.   Scoliosis    Stroke [facility])     Past Surgical History:  Procedure Laterality Date   ADENOIDECTOMY     CARDIAC CATHETERIZATION  07/2013   [facility]'x1 stent   Family History:  Family History  Problem Relation Age of Onset   Heart disease Mother    Esophageal cancer Father    Diabetes Sister    Family Psychiatric  History: See H&P Today patient also mentions he believes his son is addicted to cannabis Social History:  Social History   Substance and Sexual Activity  Alcohol Use Yes   Comment: Drink half a bottle of vodka last Thursday.     Social History   Substance and Sexual Activity  Drug Use No   Types: Marijuana   Comment: past    Social History   Socioeconomic History   Marital status: Single    Spouse name: Not on file   Number of children: Not on file   Years of education: Not on file   Highest education level: Not on file  Occupational History   Not on file  Tobacco Use   Smoking status: Former    Types: Cigarettes   Smokeless tobacco: Never  Vaping Use   Vaping Use: Never used  Substance and Sexual Activity   Alcohol use: Yes    Comment: Drink  half a bottle of vodka last Thursday.   Drug use: No    Types: Marijuana    Comment: past   Sexual activity: Not on file  Other Topics Concern   Not on file  Social History Narrative   Not on file   Social Determinants of Health   Financial Resource Strain: Not on file  Food Insecurity: Not on file  Transportation Needs: Not on file  Physical Activity: Not on file  Stress: Not on file  Social Connections: Not on file   Additional Social History:                         Sleep: Fair  Appetite:  Fair  Current Medications: Current Facility-Administered Medications  Medication Dose Route Frequency Provider Last  Rate Last Admin   acetaminophen (TYLENOL) tablet 650 mg  650 mg Oral Q6H PRN Sherlon Handing, NP       alum & mag hydroxide-simeth (MAALOX/MYLANTA) 200-200-20 MG/5ML suspension 30 mL  30 mL Oral Q4H PRN Waldon Merl F, NP       apixaban (ELIQUIS) tablet 10 mg  10 mg Oral BID Waldon Merl F, NP   10 mg at 12/18/20 0740   [START ON 12/21/2020] apixaban (ELIQUIS) tablet 5 mg  5 mg Oral BID Waldon Merl F, NP       atorvastatin (LIPITOR) tablet 40 mg  40 mg Oral Daily Clapacs, John T, MD   40 mg at 12/18/20 0739   carvedilol (COREG) tablet 25 mg  25 mg Oral BID WC Waldon Merl F, NP   25 mg at 12/18/20 0739   insulin aspart (novoLOG) injection 0-5 Units  0-5 Units Subcutaneous QHS Clapacs, John T, MD   2 Units at 12/17/20 2200   insulin aspart (novoLOG) injection 0-9 Units  0-9 Units Subcutaneous TID WC Clapacs, John T, MD   5 Units at 12/18/20 1148   magnesium hydroxide (MILK OF MAGNESIA) suspension 30 mL  30 mL Oral Daily PRN Waldon Merl F, NP       metFORMIN (GLUCOPHAGE) tablet 1,000 mg  1,000 mg Oral BID WC Waldon Merl F, NP   1,000 mg at 12/18/20 0739   mirtazapine (REMERON) tablet 30 mg  30 mg Oral QHS Clapacs, John T, MD   30 mg at 12/17/20 2039   multivitamins with iron tablet 1 tablet  1 tablet Oral Daily Antonieta Pert   1 tablet at 12/18/20 0740    Lab Results:  Results for orders placed or performed during the hospital encounter of 12/14/20 (from the past 48 hour(s))  Glucose, capillary     Status: Abnormal   Collection Time: 12/16/20  4:27 PM  Result Value Ref Range   Glucose-Capillary 233 (H) 70 - 99 mg/dL    Comment: Glucose reference range applies only to samples taken after fasting for at least 8 hours.  Glucose, capillary     Status: Abnormal   Collection Time: 12/16/20  8:20 PM  Result Value Ref Range   Glucose-Capillary 293 (H) 70 - 99 mg/dL    Comment: Glucose reference range applies only to samples taken after fasting for at least 8 hours.  CBC      Status: None   Collection Time: 12/17/20  6:13 AM  Result Value Ref Range   WBC 7.2 4.0 - 10.5 K/uL   RBC 4.54 4.22 - 5.81 MIL/uL   Hemoglobin 14.9 13.0 - 17.0 g/dL   HCT 41.8 39.0 - 52.0 %  MCV 92.1 80.0 - 100.0 fL   MCH 32.8 26.0 - 34.0 pg   MCHC 35.6 30.0 - 36.0 g/dL   RDW 15.4 11.5 - 15.5 %   Platelets 169 150 - 400 K/uL   nRBC 0.0 0.0 - 0.2 %    Comment: Performed at [facility], [address] [place], [place] [address]  Glucose, capillary     Status: Abnormal   Collection Time: 12/17/20  7:09 AM  Result Value Ref Range   Glucose-Capillary 251 (H) 70 - 99 mg/dL    Comment: Glucose reference range applies only to samples taken after fasting for at least 8 hours.  Glucose, capillary     Status: Abnormal   Collection Time: 12/17/20 11:16 AM  Result Value Ref Range   Glucose-Capillary 239 (H) 70 - 99 mg/dL    Comment: Glucose reference range applies only to samples taken after fasting for at least 8 hours.  Glucose, capillary     Status: Abnormal   Collection Time: 12/17/20  4:31 PM  Result Value Ref Range   Glucose-Capillary 217 (H) 70 - 99 mg/dL    Comment: Glucose reference range applies only to samples taken after fasting for at least 8 hours.  Glucose, capillary     Status: Abnormal   Collection Time: 12/17/20  8:26 PM  Result Value Ref Range   Glucose-Capillary 203 (H) 70 - 99 mg/dL    Comment: Glucose reference range applies only to samples taken after fasting for at least 8 hours.  Glucose, capillary     Status: Abnormal   Collection Time: 12/18/20  6:51 AM  Result Value Ref Range   Glucose-Capillary 213 (H) 70 - 99 mg/dL    Comment: Glucose reference range applies only to samples taken after fasting for at least 8 hours.   Comment 1 Notify RN   Glucose, capillary     Status: Abnormal   Collection Time: 12/18/20 11:04 AM  Result Value Ref Range   Glucose-Capillary 273 (H) 70 - 99 mg/dL    Comment: Glucose reference range applies only to samples  taken after fasting for at least 8 hours.    Blood Alcohol level:  Lab Results  Component Value Date   ETH <10 12/14/2020   ETH <10 62/13/0865    Metabolic Disorder Labs: Lab Results  Component Value Date   HGBA1C 9.0 (H) 12/15/2020   MPG 212 12/15/2020   MPG 237.43 08/21/2019   No results found for: PROLACTIN Lab Results  Component Value Date   CHOL 126 10/20/2019   TRIG 175 (H) 10/20/2019   HDL 37 (L) 10/20/2019   CHOLHDL 3.4 10/20/2019   VLDL 42 (H) 08/21/2019   LDLCALC 60 10/20/2019   LDLCALC 85 08/21/2019    Physical Findings: AIMS:  , ,  ,  ,    CIWA:    COWS:     Musculoskeletal: Strength & Muscle Tone: within normal limits Gait & Station: unsteady Patient leans: N/A  Psychiatric Specialty Exam:  Presentation  General Appearance: Appropriate for Environment  Eye Contact:Good  Speech:Clear and Coherent  Speech Volume:Normal  Handedness:Right   Mood and Affect  Mood:Dysphoric; Depressed  Affect:Congruent   Thought Process  Thought Processes:Coherent  Descriptions of Associations:Intact  Orientation:Full (Time, Place and Person)  Thought Content:Rumination  History of Schizophrenia/Schizoaffective disorder:No data recorded Duration of Psychotic Symptoms:No data recorded Hallucinations:Hallucinations: None  Ideas of Reference:None  Suicidal Thoughts:Suicidal Thoughts: Yes, Passive  Homicidal Thoughts:Homicidal Thoughts: No   Sensorium  Memory:Immediate Fair;  Recent Fair; Remote Fair  Judgment:Intact  Insight:Present   Executive Functions  Concentration:Fair  Attention Span:Fair  [place]   Psychomotor Activity  Psychomotor Activity:Psychomotor Activity: Normal   Assets  Assets:Communication Skills; Desire for Improvement; Housing; Resilience   Sleep  Sleep:Sleep: Good Number of Hours of Sleep: 8.75    Physical Exam: Physical Exam ROS Blood pressure (!)  152/95, pulse 72, temperature 98.6 F (37 C), temperature source Oral, resp. rate 16, height 5\' 6"  (1.676 m), weight 77.1 kg, SpO2 99 %. Body mass index is 27.43 kg/m.   Treatment Plan Summary: Daily contact with patient to assess and evaluate symptoms and progress in treatment and Medication management  1) MDD, recurrent, severe without psychotic features- patient remains depressed with passive SI today - Continue Remeron 30 mg QHS. Encourage group therapy attendance   2) Treatment of acute DVT - Eliquis 10 mg BID, followed by Eliquis 5 mg BID starting 9/29  3) Diabetes Mellitus Type 2, poorly controlled - Metformin 1000 mg BID, correctional novolog TID with meals and bedtime - Diabetic coordinator consult placed  Salley Scarlet, MD 12/18/2020, 12:14 PM

## 2020-12-18 NOTE — Group Note (Signed)
[facility] LCSW Group Therapy Note    Group Date: 12/18/2020 Start Time: 3546 End Time: 1400  Type of Therapy and Topic:  Group Therapy:  Overcoming Obstacles  Participation Level:  [facility] PARTICIPATION LEVEL: Active  Mood:  Description of Group:   In this group patients will be encouraged to explore what they see as obstacles to their own wellness and recovery. They will be guided to discuss their thoughts, feelings, and behaviors related to these obstacles. The group will process together ways to cope with barriers, with attention given to specific choices patients can make. Each patient will be challenged to identify changes they are motivated to make in order to overcome their obstacles. This group will be process-oriented, with patients participating in exploration of their own experiences as well as giving and receiving support and challenge from other group members.  Therapeutic Goals: 1. Patient will identify personal and current obstacles as they relate to admission. 2. Patient will identify barriers that currently interfere with their wellness or overcoming obstacles.  3. Patient will identify feelings, thought process and behaviors related to these barriers. 4. Patient will identify two changes they are willing to make to overcome these obstacles:    Summary of Patient Progress Pt was present for the entirety of group. He shared his obstacle of physical limitations due to stroke and loss of his ability to do work that he would typically do. Pt reported that money is a barrier to him overcoming this obstacle and shared his frustration with the disability process. He also shared his dilemma of not feeling that he has an issue with alcohol but needing to stop in order to receive psychiatric services. Pt also talked about missing a recent court date and was advised that as he was in the hospital at that time, he could utilize his discharge paperwork  to keep from having a failure to appear. Pt's comments were pertinent to the discussion.    Therapeutic Modalities:   Cognitive Behavioral Therapy Solution Focused Therapy Motivational Interviewing Relapse Prevention Therapy   Shirl Harris, LCSW

## 2020-12-18 NOTE — Plan of Care (Signed)
Patient rated his depression 3/10 and anxiety 0/10. Patient is appropriate with staff & peers. Patient could not talk to his son today.Patient verbalized concern about his dog.Patient states " I drink alcohol not for depression.I like to drink and no plan to quite at this time." Patient is receptive with staff about the adverse effect of alcohol. Denies SI,HI and AVH. ADLs maintained. Appetite and energy level good. Support and encouragement given.

## 2020-12-18 NOTE — Progress Notes (Signed)
Patient has been pleasant and cooperative. Still appears somewhat flat and sad. Denies SI, HI and AVH

## 2020-12-18 NOTE — Plan of Care (Signed)
  Problem: Education: Goal: Knowledge of [facility] Education information/materials will improve Outcome: Progressing Goal: Emotional status will improve Outcome: Progressing Goal: Mental status will improve Outcome: Progressing Goal: Verbalization of understanding the information provided will improve Outcome: Progressing   Problem: Safety: Goal: Periods of time without injury will increase Outcome: Progressing   Problem: Education: Goal: Ability to make informed decisions regarding treatment will improve Outcome: Progressing   Problem: Medication: Goal: Compliance with prescribed medication regimen will improve Outcome: Progressing

## 2020-12-19 LAB — GLUCOSE, CAPILLARY
Glucose-Capillary: 188 mg/dL — ABNORMAL HIGH (ref 70–99)
Glucose-Capillary: 153 mg/dL — ABNORMAL HIGH (ref 70–99)
Glucose-Capillary: 210 mg/dL — ABNORMAL HIGH (ref 70–99)
Glucose-Capillary: 160 mg/dL — ABNORMAL HIGH (ref 70–99)

## 2020-12-19 NOTE — Progress Notes (Signed)
Patient has been pleasant and cooperative. Blood sugar was better at hs. He denies SI but still appears somewhat said. Contracts for safety

## 2020-12-19 NOTE — Progress Notes (Signed)
[facility] MD Progress Note  12/19/2020 12:08 PM SREEKAR BROYHILL  MRN:  [id number]  CC "Physically okay, mentally terrible"  Subjective:  50 year old male presenting voluntarily for worsening depression and active suicidal thoughts. No acute events overnight, medication compliant, attending to ADLs. Patient seen one-on-one today. He notes he continues to have severe depression and thoughts of being better off dead. He denies HI/AH/VH. He has called his brother and his lawyer, but neither answer. He left messages for both, and is hopeful they will return his call. Offer made to contact his lawyer on his behalf, but patient declines stating he would like to try again on his own this afternoon.   Principal Problem: Severe episode of recurrent major depressive disorder, without psychotic features ([place]) Diagnosis: Principal Problem:   Severe episode of recurrent major depressive disorder, without psychotic features ([place]) Active Problems:   CAD (coronary artery disease)   HTN (hypertension)   Hyperlipidemia   Diabetes mellitus, type II (HCC)   Alcohol use disorder, moderate, dependence ([place])   MDD (major depressive disorder)   Deep vein thrombosis (DVT) ([place])  Total Time spent with patient: 30 minutes  Past Psychiatric History: See H&P  Past Medical History:  Past Medical History:  Diagnosis Date   CAD (coronary artery disease)    a. 07/2013 NSTEMI/PCI: LCX 36m (4.0x23 Xience DES), RCA 74m, EF > 55%;  b. 07/2013 Echo: EF 60-65%, diast dysfxn, mild LVH, mildly dil LA, nl RV size/fxn, nl RVSP.   Diabetes mellitus without complication ([place])    History of tobacco abuse    a. quit ~ 2010   Hyperlipidemia    Morbid obesity ([place])    a. weighed 168 @ age 35.   Scoliosis    Stroke [facility])     Past Surgical History:  Procedure Laterality Date   ADENOIDECTOMY     CARDIAC CATHETERIZATION  07/2013   [facility]'x1 stent   Family History:  Family History  Problem Relation Age of Onset   Heart disease Mother     Esophageal cancer Father    Diabetes Sister    Family Psychiatric  History: See previous Social History:  Social History   Substance and Sexual Activity  Alcohol Use Yes   Comment: Drink half a bottle of vodka last Thursday.     Social History   Substance and Sexual Activity  Drug Use No   Types: Marijuana   Comment: past    Social History   Socioeconomic History   Marital status: Single    Spouse name: Not on file   Number of children: Not on file   Years of education: Not on file   Highest education level: Not on file  Occupational History   Not on file  Tobacco Use   Smoking status: Former    Types: Cigarettes   Smokeless tobacco: Never  Vaping Use   Vaping Use: Never used  Substance and Sexual Activity   Alcohol use: Yes    Comment: Drink half a bottle of vodka last Thursday.   Drug use: No    Types: Marijuana    Comment: past   Sexual activity: Not on file  Other Topics Concern   Not on file  Social History Narrative   Not on file   Social Determinants of Health   Financial Resource Strain: Not on file  Food Insecurity: Not on file  Transportation Needs: Not on file  Physical Activity: Not on file  Stress: Not on file  Social Connections: Not on  file   Additional Social History:                         Sleep: Fair  Appetite:  Fair  Current Medications: Current Facility-Administered Medications  Medication Dose Route Frequency Provider Last Rate Last Admin   acetaminophen (TYLENOL) tablet 650 mg  650 mg Oral Q6H PRN Sherlon Handing, NP       alum & mag hydroxide-simeth (MAALOX/MYLANTA) 200-200-20 MG/5ML suspension 30 mL  30 mL Oral Q4H PRN Waldon Merl F, NP       apixaban (ELIQUIS) tablet 10 mg  10 mg Oral BID Waldon Merl F, NP   10 mg at 12/19/20 0734   [START ON 12/21/2020] apixaban (ELIQUIS) tablet 5 mg  5 mg Oral BID Waldon Merl F, NP       atorvastatin (LIPITOR) tablet 40 mg  40 mg Oral Daily Clapacs, John T, MD    40 mg at 12/19/20 0733   carvedilol (COREG) tablet 25 mg  25 mg Oral BID WC Waldon Merl F, NP   25 mg at 12/19/20 0733   glimepiride (AMARYL) tablet 2 mg  2 mg Oral Q breakfast Salley Scarlet, MD   2 mg at 12/19/20 7564   insulin aspart (novoLOG) injection 0-5 Units  0-5 Units Subcutaneous QHS Clapacs, John T, MD   2 Units at 12/17/20 2200   insulin aspart (novoLOG) injection 0-9 Units  0-9 Units Subcutaneous TID WC Clapacs, John T, MD   2 Units at 12/19/20 1144   magnesium hydroxide (MILK OF MAGNESIA) suspension 30 mL  30 mL Oral Daily PRN Waldon Merl F, NP       metFORMIN (GLUCOPHAGE) tablet 1,000 mg  1,000 mg Oral BID WC Waldon Merl F, NP   1,000 mg at 12/19/20 3329   mirtazapine (REMERON) tablet 30 mg  30 mg Oral QHS Clapacs, John T, MD   30 mg at 12/18/20 2102   multivitamins with iron tablet 1 tablet  1 tablet Oral Daily Antonieta Pert   1 tablet at 12/19/20 5188    Lab Results:  Results for orders placed or performed during the hospital encounter of 12/14/20 (from the past 48 hour(s))  Glucose, capillary     Status: Abnormal   Collection Time: 12/17/20  4:31 PM  Result Value Ref Range   Glucose-Capillary 217 (H) 70 - 99 mg/dL    Comment: Glucose reference range applies only to samples taken after fasting for at least 8 hours.  Glucose, capillary     Status: Abnormal   Collection Time: 12/17/20  8:26 PM  Result Value Ref Range   Glucose-Capillary 203 (H) 70 - 99 mg/dL    Comment: Glucose reference range applies only to samples taken after fasting for at least 8 hours.  Glucose, capillary     Status: Abnormal   Collection Time: 12/18/20  6:51 AM  Result Value Ref Range   Glucose-Capillary 213 (H) 70 - 99 mg/dL    Comment: Glucose reference range applies only to samples taken after fasting for at least 8 hours.   Comment 1 Notify RN   Glucose, capillary     Status: Abnormal   Collection Time: 12/18/20 11:04 AM  Result Value Ref Range   Glucose-Capillary 273 (H) 70  - 99 mg/dL    Comment: Glucose reference range applies only to samples taken after fasting for at least 8 hours.  Glucose, capillary     Status: Abnormal  Collection Time: 12/18/20  4:05 PM  Result Value Ref Range   Glucose-Capillary 210 (H) 70 - 99 mg/dL    Comment: Glucose reference range applies only to samples taken after fasting for at least 8 hours.  Glucose, capillary     Status: Abnormal   Collection Time: 12/18/20  8:48 PM  Result Value Ref Range   Glucose-Capillary 193 (H) 70 - 99 mg/dL    Comment: Glucose reference range applies only to samples taken after fasting for at least 8 hours.   Comment 1 Notify RN   Glucose, capillary     Status: Abnormal   Collection Time: 12/19/20  6:58 AM  Result Value Ref Range   Glucose-Capillary 188 (H) 70 - 99 mg/dL    Comment: Glucose reference range applies only to samples taken after fasting for at least 8 hours.   Comment 1 Notify RN   Glucose, capillary     Status: Abnormal   Collection Time: 12/19/20 11:07 AM  Result Value Ref Range   Glucose-Capillary 153 (H) 70 - 99 mg/dL    Comment: Glucose reference range applies only to samples taken after fasting for at least 8 hours.    Blood Alcohol level:  Lab Results  Component Value Date   ETH <10 12/14/2020   ETH <10 81/44/8185    Metabolic Disorder Labs: Lab Results  Component Value Date   HGBA1C 9.0 (H) 12/15/2020   MPG 212 12/15/2020   MPG 237.43 08/21/2019   No results found for: PROLACTIN Lab Results  Component Value Date   CHOL 126 10/20/2019   TRIG 175 (H) 10/20/2019   HDL 37 (L) 10/20/2019   CHOLHDL 3.4 10/20/2019   VLDL 42 (H) 08/21/2019   LDLCALC 60 10/20/2019   LDLCALC 85 08/21/2019    Physical Findings: AIMS:  , ,  ,  ,    CIWA:    COWS:     Musculoskeletal: Strength & Muscle Tone: within normal limits Gait & Station: unsteady Patient leans: N/A  Psychiatric Specialty Exam:  Presentation  General Appearance: Appropriate for Environment  Eye  Contact:Good  Speech:Clear and Coherent  Speech Volume:Normal  Handedness:Right   Mood and Affect  Mood:Dysphoric; Depressed  Affect:Congruent   Thought Process  Thought Processes:Coherent  Descriptions of Associations:Intact  Orientation:Full (Time, Place and Person)  Thought Content:Rumination  History of Schizophrenia/Schizoaffective disorder:No Duration of Psychotic Symptoms:N/A Hallucinations:Hallucinations: None  Ideas of Reference:None  Suicidal Thoughts:Suicidal Thoughts: Yes, Passive  Homicidal Thoughts:Homicidal Thoughts: No   Sensorium  Memory:Immediate Fair; Recent Fair; Remote Fair  Judgment:Intact  Insight:Present   Executive Functions  Concentration:Fair  Attention Span:Fair  [place] of Knowledge:Fair  Language:Fair   Psychomotor Activity  Psychomotor Activity:Psychomotor Activity: Normal   Assets  Assets:Communication Skills; Desire for Improvement; Housing; Resilience   Sleep  Sleep:Sleep: Good Number of Hours of Sleep: 8.75    Physical Exam: Physical Exam ROS Blood pressure (!) 146/88, pulse 79, temperature 99.1 F (37.3 C), resp. rate 16, height 5\' 6"  (1.676 m), weight 77.1 kg, SpO2 98 %. Body mass index is 27.43 kg/m.   Treatment Plan Summary: Daily contact with patient to assess and evaluate symptoms and progress in treatment and Medication management  1) MDD, recurrent, severe without psychotic features- patient remains depressed with passive SI today - Continue Remeron 30 mg QHS. Encourage group therapy attendance   2) Treatment of acute DVT - Eliquis 10 mg BID, followed by Eliquis 5 mg BID starting 9/29  3) Diabetes Mellitus Type 2, poorly  controlled - Appreciate diabetic coordinator input. Continue Metformin 1000 mg BID, amaryl 2 mg daily, correctional novolog TID with meals and bedtime   Salley Scarlet, MD 12/19/2020, 12:08 PM

## 2020-12-19 NOTE — Plan of Care (Signed)
  Problem: Education: Goal: Knowledge of [facility] Education information/materials will improve Outcome: Progressing Goal: Emotional status will improve Outcome: Progressing Goal: Mental status will improve Outcome: Progressing Goal: Verbalization of understanding the information provided will improve Outcome: Progressing   Problem: Safety: Goal: Periods of time without injury will increase Outcome: Progressing   Problem: Education: Goal: Ability to make informed decisions regarding treatment will improve Outcome: Progressing   Problem: Medication: Goal: Compliance with prescribed medication regimen will improve Outcome: Progressing

## 2020-12-19 NOTE — Plan of Care (Signed)
Patient visible in the milieu.Appropriate with staff & peers. Patient stated that he could talk to his son and it did not go bad. Patient stated that his son is taking care of his dog and he can go back to him upon discharge. Patient denies SI,HI and AVH at this time. ADLs maintained. Appetite and energy level good. Support and encouragement given.

## 2020-12-19 NOTE — Progress Notes (Signed)
Recreation Therapy Notes  Date: 12/19/2020  Time: 10:15 am   Location: Courtyard   Behavioral response: Appropriate  Intervention Topic: Leisure    Discussion/Intervention:  Group content on today was focused on Leisure skills. The group expressed what leisure is and how often they participate in it. Individuals expressed the difference between positive and negative leisure. Patients described how they feel when they participate in leisure and why it is important to have leisure time. The group expressed how they go about picking a leisure and if it involves others. Individuals stated how many leisure activities they have to pick from. Patients participated in the intervention "Name the Leisure", each patient had the opportunity to learn a new positive leisure as well as work with their peers. Clinical Observations/Feedback: Patient came to group and was focused on the importance and use of leisure skills. Individual was social with peers and staff while participating in the intervention. Tanasia Budzinski LRT/CTRS         Christian [name] 12/19/2020 11:55 AM

## 2020-12-20 LAB — GLUCOSE, CAPILLARY
Glucose-Capillary: 168 mg/dL — ABNORMAL HIGH (ref 70–99)
Glucose-Capillary: 184 mg/dL — ABNORMAL HIGH (ref 70–99)
Glucose-Capillary: 144 mg/dL — ABNORMAL HIGH (ref 70–99)
Glucose-Capillary: 126 mg/dL — ABNORMAL HIGH (ref 70–99)
Glucose-Capillary: 169 mg/dL — ABNORMAL HIGH (ref 70–99)

## 2020-12-20 MED ORDER — METFORMIN HCL 1000 MG PO TABS: 1000.0000 mg | ORAL_TABLET | Freq: Two times a day (BID) | ORAL | 1 refills | Status: DC

## 2020-12-20 MED ORDER — ATORVASTATIN CALCIUM 40 MG PO TABS: 40.0000 mg | ORAL_TABLET | Freq: Every day | ORAL | 1 refills | Status: DC

## 2020-12-20 MED ORDER — APIXABAN 5 MG PO TABS: 5.0000 mg | ORAL_TABLET | Freq: Two times a day (BID) | ORAL | 1 refills | Status: DC

## 2020-12-20 MED ORDER — CARVEDILOL 25 MG PO TABS: 25.0000 mg | ORAL_TABLET | Freq: Two times a day (BID) | ORAL | 1 refills | Status: DC

## 2020-12-20 MED ORDER — MIRTAZAPINE 30 MG PO TABS: 30.0000 mg | ORAL_TABLET | Freq: Every day | ORAL | 1 refills | Status: DC

## 2020-12-20 NOTE — Progress Notes (Signed)
[facility] MD Progress Note  12/20/2020 12:09 PM Christian [name]  MRN:  [id number]  CC "Pretty okay I guess."  Subjective:  50 year old male presenting voluntarily for worsening depression and active suicidal thoughts. No acute events overnight, medication compliant, attending to ADLs. Patient seen one-on-one again today. He is ruminating on his legal charges today. He has called and left messages with his lawyer, but has not heard back yet. He feels he is still eating and sleeping okay. He was able to speak with him son, and confirmed he can return home. He denies suicidal ideations, homicidal ideations, visual hallucinations, and auditory hallucinations. He is agreeable to continued outpatient mental health treatment. CSW team informed to begin discharge planning for tentative discharge home tomorrow.   Principal Problem: Severe episode of recurrent major depressive disorder, without psychotic features ([place]) Diagnosis: Principal Problem:   Severe episode of recurrent major depressive disorder, without psychotic features ([place]) Active Problems:   CAD (coronary artery disease)   HTN (hypertension)   Hyperlipidemia   Diabetes mellitus, type II (HCC)   Alcohol use disorder, moderate, dependence ([place])   MDD (major depressive disorder)   Deep vein thrombosis (DVT) ([place])  Total Time spent with patient: 30 minutes  Past Psychiatric History: See H&P  Past Medical History:  Past Medical History:  Diagnosis Date   CAD (coronary artery disease)    a. 07/2013 NSTEMI/PCI: LCX 73m (4.0x23 Xience DES), RCA 20m, EF > 55%;  b. 07/2013 Echo: EF 60-65%, diast dysfxn, mild LVH, mildly dil LA, nl RV size/fxn, nl RVSP.   Diabetes mellitus without complication ([place])    History of tobacco abuse    a. quit ~ 2010   Hyperlipidemia    Morbid obesity ([place])    a. weighed 168 @ age 36.   Scoliosis    Stroke [facility])     Past Surgical History:  Procedure Laterality Date   ADENOIDECTOMY     CARDIAC CATHETERIZATION  07/2013    [facility]'x1 stent   Family History:  Family History  Problem Relation Age of Onset   Heart disease Mother    Esophageal cancer Father    Diabetes Sister    Family Psychiatric  History: See previous Social History:  Social History   Substance and Sexual Activity  Alcohol Use Yes   Comment: Drink half a bottle of vodka last Thursday.     Social History   Substance and Sexual Activity  Drug Use No   Types: Marijuana   Comment: past    Social History   Socioeconomic History   Marital status: Single    Spouse name: Not on file   Number of children: Not on file   Years of education: Not on file   Highest education level: Not on file  Occupational History   Not on file  Tobacco Use   Smoking status: Former    Types: Cigarettes   Smokeless tobacco: Never  Vaping Use   Vaping Use: Never used  Substance and Sexual Activity   Alcohol use: Yes    Comment: Drink half a bottle of vodka last Thursday.   Drug use: No    Types: Marijuana    Comment: past   Sexual activity: Not on file  Other Topics Concern   Not on file  Social History Narrative   Not on file   Social Determinants of Health   Financial Resource Strain: Not on file  Food Insecurity: Not on file  Transportation Needs: Not on file  Physical Activity:  Not on file  Stress: Not on file  Social Connections: Not on file   Additional Social History:                         Sleep: Fair  Appetite:  Fair  Current Medications: Current Facility-Administered Medications  Medication Dose Route Frequency Provider Last Rate Last Admin   acetaminophen (TYLENOL) tablet 650 mg  650 mg Oral Q6H PRN Sherlon Handing, NP       alum & mag hydroxide-simeth (MAALOX/MYLANTA) 200-200-20 MG/5ML suspension 30 mL  30 mL Oral Q4H PRN Waldon Merl F, NP       apixaban (ELIQUIS) tablet 10 mg  10 mg Oral BID Waldon Merl F, NP   10 mg at 12/20/20 0741   [START ON 12/21/2020] apixaban (ELIQUIS) tablet 5 mg  5 mg  Oral BID Waldon Merl F, NP       atorvastatin (LIPITOR) tablet 40 mg  40 mg Oral Daily Clapacs, John T, MD   40 mg at 12/20/20 0740   carvedilol (COREG) tablet 25 mg  25 mg Oral BID WC Waldon Merl F, NP   25 mg at 12/20/20 0740   glimepiride (AMARYL) tablet 2 mg  2 mg Oral Q breakfast Salley Scarlet, MD   2 mg at 12/20/20 6213   insulin aspart (novoLOG) injection 0-5 Units  0-5 Units Subcutaneous QHS Clapacs, John T, MD   2 Units at 12/17/20 2200   insulin aspart (novoLOG) injection 0-9 Units  0-9 Units Subcutaneous TID WC Clapacs, John T, MD   7 Units at 12/20/20 1147   magnesium hydroxide (MILK OF MAGNESIA) suspension 30 mL  30 mL Oral Daily PRN Waldon Merl F, NP       metFORMIN (GLUCOPHAGE) tablet 1,000 mg  1,000 mg Oral BID WC Waldon Merl F, NP   1,000 mg at 12/20/20 0740   mirtazapine (REMERON) tablet 30 mg  30 mg Oral QHS Clapacs, John T, MD   30 mg at 12/19/20 2113   multivitamins with iron tablet 1 tablet  1 tablet Oral Daily Antonieta Pert   1 tablet at 12/20/20 0865    Lab Results:  Results for orders placed or performed during the hospital encounter of 12/14/20 (from the past 48 hour(s))  Glucose, capillary     Status: Abnormal   Collection Time: 12/18/20  4:05 PM  Result Value Ref Range   Glucose-Capillary 210 (H) 70 - 99 mg/dL    Comment: Glucose reference range applies only to samples taken after fasting for at least 8 hours.  Glucose, capillary     Status: Abnormal   Collection Time: 12/18/20  8:48 PM  Result Value Ref Range   Glucose-Capillary 193 (H) 70 - 99 mg/dL    Comment: Glucose reference range applies only to samples taken after fasting for at least 8 hours.   Comment 1 Notify RN   Glucose, capillary     Status: Abnormal   Collection Time: 12/19/20  6:58 AM  Result Value Ref Range   Glucose-Capillary 188 (H) 70 - 99 mg/dL    Comment: Glucose reference range applies only to samples taken after fasting for at least 8 hours.   Comment 1 Notify RN    Glucose, capillary     Status: Abnormal   Collection Time: 12/19/20 11:07 AM  Result Value Ref Range   Glucose-Capillary 153 (H) 70 - 99 mg/dL    Comment: Glucose reference range applies only to  samples taken after fasting for at least 8 hours.  Glucose, capillary     Status: Abnormal   Collection Time: 12/19/20  4:21 PM  Result Value Ref Range   Glucose-Capillary 210 (H) 70 - 99 mg/dL    Comment: Glucose reference range applies only to samples taken after fasting for at least 8 hours.  Glucose, capillary     Status: Abnormal   Collection Time: 12/19/20  8:24 PM  Result Value Ref Range   Glucose-Capillary 160 (H) 70 - 99 mg/dL    Comment: Glucose reference range applies only to samples taken after fasting for at least 8 hours.   Comment 1 Notify RN   Glucose, capillary     Status: Abnormal   Collection Time: 12/20/20  7:00 AM  Result Value Ref Range   Glucose-Capillary 168 (H) 70 - 99 mg/dL    Comment: Glucose reference range applies only to samples taken after fasting for at least 8 hours.   Comment 1 Notify RN   Glucose, capillary     Status: Abnormal   Collection Time: 12/20/20  7:37 AM  Result Value Ref Range   Glucose-Capillary 184 (H) 70 - 99 mg/dL    Comment: Glucose reference range applies only to samples taken after fasting for at least 8 hours.  Glucose, capillary     Status: Abnormal   Collection Time: 12/20/20 11:18 AM  Result Value Ref Range   Glucose-Capillary 144 (H) 70 - 99 mg/dL    Comment: Glucose reference range applies only to samples taken after fasting for at least 8 hours.   Comment 1 Notify RN     Blood Alcohol level:  Lab Results  Component Value Date   ETH <10 12/14/2020   ETH <10 59/56/3875    Metabolic Disorder Labs: Lab Results  Component Value Date   HGBA1C 9.0 (H) 12/15/2020   MPG 212 12/15/2020   MPG 237.43 08/21/2019   No results found for: PROLACTIN Lab Results  Component Value Date   CHOL 126 10/20/2019   TRIG 175 (H)  10/20/2019   HDL 37 (L) 10/20/2019   CHOLHDL 3.4 10/20/2019   VLDL 42 (H) 08/21/2019   LDLCALC 60 10/20/2019   LDLCALC 85 08/21/2019    Physical Findings: AIMS:  , ,  ,  ,    CIWA:    COWS:     Musculoskeletal: Strength & Muscle Tone: within normal limits Gait & Station: unsteady Patient leans: N/A  Psychiatric Specialty Exam:  Presentation  General Appearance: Appropriate for Environment  Eye Contact:Good  Speech:Clear and Coherent  Speech Volume:Normal  Handedness:Right   Mood and Affect  Mood:Anxious Affect:Congruent   Thought Process  Thought Processes:Coherent  Descriptions of Associations:Intact  Orientation:Full (Time, Place and Person)  Thought Content:Rumination  History of Schizophrenia/Schizoaffective disorder:No Duration of Psychotic Symptoms:N/A Hallucinations:None  Ideas of Reference:None  Suicidal Thoughts:Denies  Homicidal Thoughts:Denies   Sensorium  Memory:Immediate Fair; Recent Fair; Remote Fair  Judgment:Intact  Insight:Present   Executive Functions  Concentration:Fair  Attention Span:Fair  [place]   Psychomotor Activity  Psychomotor Activity:No data recorded   Assets  Assets:Communication Skills; Desire for Improvement; Housing; Resilience   Sleep  Sleep:No data recorded    Physical Exam: Physical Exam ROS Blood pressure (!) 140/96, pulse 75, temperature 98.7 F (37.1 C), temperature source Oral, resp. rate 16, height 5\' 6"  (1.676 m), weight 77.1 kg, SpO2 98 %. Body mass index is 27.43 kg/m.   Treatment Plan Summary: Daily  contact with patient to assess and evaluate symptoms and progress in treatment and Medication management  1) MDD, recurrent, severe without psychotic features- Patient now denying SI, continues to express some remaining symptoms of depression - Continue Remeron 30 mg QHS. Encourage group therapy attendance   2) Treatment of acute  DVT - Eliquis 10 mg BID, followed by Eliquis 5 mg BID starting tomorrow  3) Diabetes Mellitus Type 2, improving - Appreciate diabetic coordinator input. Continue Metformin 1000 mg BID, amaryl 2 mg daily, correctional novolog TID with meals and bedtime   Salley Scarlet, MD 12/20/2020, 12:09 PM

## 2020-12-20 NOTE — Plan of Care (Signed)
  Problem: Leisure Education Goal: STG - Patient will identify 3 healthy leisure activities that can be utilized post d/c within 5 recreation therapy group sessions Description: STG - Patient will identify 3 healthy leisure activities that can be utilized post d/c within 5 recreation therapy group sessions Outcome: Progressing

## 2020-12-20 NOTE — Progress Notes (Signed)
D: Pt alert and oriented. Pt rates depression 3/10, hopelessness 0/10, and anxiety 0/10. Pt reports energy level as normal and concentration as being good. Pt reports sleep last night as being fair. Pt did not receive medications for sleep. Pt reports experiencing 3/10 Right calf pain at this time, pt refused prn pain med and stated Dr. Domingo Cocking is aware. Pt denies experiencing any SI/HI, or AVH at this time.   A: Scheduled medications administered to pt, per MD orders. Support and encouragement provided. Frequent verbal contact made. Routine safety checks conducted q15 minutes.   R: No adverse drug reactions noted. Pt verbally contracts for safety at this time. Pt complaint with medications and treatment plan. Pt interacts well with others on the unit. Pt remains safe at this time. Will continue to monitor.

## 2020-12-20 NOTE — Progress Notes (Signed)
Recreation Therapy Notes   Date: 12/20/2020  Time: 10:00 am   Location: Craft room  Behavioral response: Appropriate  Intervention Topic: Time Management  Discussion/Intervention:  Group content today was focused on time management. The group defined time management and identified healthy ways to manage time. Individuals expressed how much of the 24 hours they use in a day. Patients expressed how much time they use just for themselves personally. The group expressed how they have managed their time in the past. Individuals participated in the intervention "Managing Life" where they had a chance to see how much of the 24 hours they use and where it goes. Clinical Observations/Feedback: Patient came to group and defined time management as a schedule. He explained that time management is important to help with routine. Individual was social with peers and staff while participating in the intervention. Leshae Mcclay LRT/CTRS         Cong Hightower 12/20/2020 11:20 AM

## 2020-12-20 NOTE — BH IP Treatment Plan (Signed)
Interdisciplinary Treatment and Diagnostic Plan Update  12/20/2020 Time of Session: 8:30AM Christian [name] MRN: [id number]  Principal Diagnosis: Severe episode of recurrent major depressive disorder, without psychotic features ([place])  Secondary Diagnoses: Principal Problem:   Severe episode of recurrent major depressive disorder, without psychotic features ([place]) Active Problems:   CAD (coronary artery disease)   HTN (hypertension)   Hyperlipidemia   Diabetes mellitus, type II ([place])   Alcohol use disorder, moderate, dependence ([place])   MDD (major depressive disorder)   Deep vein thrombosis (DVT) (HCC)   Current Medications:  Current Facility-Administered Medications  Medication Dose Route Frequency Provider Last Rate Last Admin   acetaminophen (TYLENOL) tablet 650 mg  650 mg Oral Q6H PRN Sherlon Handing, NP       alum & mag hydroxide-simeth (MAALOX/MYLANTA) 200-200-20 MG/5ML suspension 30 mL  30 mL Oral Q4H PRN Waldon Merl F, NP       apixaban (ELIQUIS) tablet 10 mg  10 mg Oral BID Waldon Merl F, NP   10 mg at 12/20/20 0741   [START ON 12/21/2020] apixaban (ELIQUIS) tablet 5 mg  5 mg Oral BID Waldon Merl F, NP       atorvastatin (LIPITOR) tablet 40 mg  40 mg Oral Daily Clapacs, John T, MD   40 mg at 12/20/20 0740   carvedilol (COREG) tablet 25 mg  25 mg Oral BID WC Waldon Merl F, NP   25 mg at 12/20/20 0740   glimepiride (AMARYL) tablet 2 mg  2 mg Oral Q breakfast Salley Scarlet, MD   2 mg at 12/20/20 9381   insulin aspart (novoLOG) injection 0-5 Units  0-5 Units Subcutaneous QHS Clapacs, John T, MD   2 Units at 12/17/20 2200   insulin aspart (novoLOG) injection 0-9 Units  0-9 Units Subcutaneous TID WC Clapacs, John T, MD   2 Units at 12/20/20 0740   magnesium hydroxide (MILK OF MAGNESIA) suspension 30 mL  30 mL Oral Daily PRN Waldon Merl F, NP       metFORMIN (GLUCOPHAGE) tablet 1,000 mg  1,000 mg Oral BID WC Waldon Merl F, NP   1,000 mg at 12/20/20 0740    mirtazapine (REMERON) tablet 30 mg  30 mg Oral QHS Clapacs, John T, MD   30 mg at 12/19/20 2113   multivitamins with iron tablet 1 tablet  1 tablet Oral Daily Antonieta Pert   1 tablet at 12/20/20 0741   PTA Medications: Medications Prior to Admission  Medication Sig Dispense Refill Last Dose   apixaban (ELIQUIS) 5 MG TABS tablet TAKE 2 TABLETS (10MG  TOTAL) BY MOUTH TWICE DAILY FOR 7 DAYS. THEN TAKE ONE TABLET (5MG  TOTAL) BY MOUTH TWICE DAILY THEREAFTER. (Patient not taking: No sig reported) 74 tablet 0    carvedilol (COREG) 25 MG tablet Take 1 tablet (25 mg total) by mouth 2 (two) times daily. (Patient not taking: No sig reported) 60 tablet 1    [DISCONTINUED] metFORMIN (GLUCOPHAGE) 1000 MG tablet Take 1 tablet (1,000 mg total) by mouth 2 (two) times daily with a meal. (Patient not taking: No sig reported) 60 tablet 1     Patient Stressors: Loss of Mother   Substance abuse    Patient Strengths: Ability for insight  Motivation for treatment/growth   Treatment Modalities: Medication Management, Group therapy, Case management,  1 to 1 session with clinician, Psychoeducation, Recreational therapy.   Physician Treatment Plan for Primary Diagnosis: Severe episode of recurrent major depressive disorder, without psychotic features ([place]) Long Term  Goal(s): Improvement in symptoms so as ready for discharge   Short Term Goals: Ability to maintain clinical measurements within normal limits will improve Compliance with prescribed medications will improve Ability to verbalize feelings will improve Ability to disclose and discuss suicidal ideas  Medication Management: Evaluate patient's response, side effects, and tolerance of medication regimen.  Therapeutic Interventions: 1 to 1 sessions, Unit Group sessions and Medication administration.  Evaluation of Outcomes: Progressing  Physician Treatment Plan for Secondary Diagnosis: Principal Problem:   Severe episode of recurrent major depressive  disorder, without psychotic features ([place]) Active Problems:   CAD (coronary artery disease)   HTN (hypertension)   Hyperlipidemia   Diabetes mellitus, type II (HCC)   Alcohol use disorder, moderate, dependence ([place])   MDD (major depressive disorder)   Deep vein thrombosis (DVT) ([place])  Long Term Goal(s): Improvement in symptoms so as ready for discharge   Short Term Goals: Ability to maintain clinical measurements within normal limits will improve Compliance with prescribed medications will improve Ability to verbalize feelings will improve Ability to disclose and discuss suicidal ideas     Medication Management: Evaluate patient's response, side effects, and tolerance of medication regimen.  Therapeutic Interventions: 1 to 1 sessions, Unit Group sessions and Medication administration.  Evaluation of Outcomes: Progressing   RN Treatment Plan for Primary Diagnosis: Severe episode of recurrent major depressive disorder, without psychotic features ([place]) Long Term Goal(s): Knowledge of disease and therapeutic regimen to maintain health will improve  Short Term Goals: Ability to demonstrate self-control, Ability to participate in decision making will improve, Ability to verbalize feelings will improve, Ability to disclose and discuss suicidal ideas, Ability to identify and develop effective coping behaviors will improve, and Compliance with prescribed medications will improve  Medication Management: RN will administer medications as ordered by provider, will assess and evaluate patient's response and provide education to patient for prescribed medication. RN will report any adverse and/or side effects to prescribing provider.  Therapeutic Interventions: 1 on 1 counseling sessions, Psychoeducation, Medication administration, Evaluate responses to treatment, Monitor vital signs and CBGs as ordered, Perform/monitor CIWA, COWS, AIMS and Fall Risk screenings as ordered, Perform wound care  treatments as ordered.  Evaluation of Outcomes: Progressing   LCSW Treatment Plan for Primary Diagnosis: Severe episode of recurrent major depressive disorder, without psychotic features ([place]) Long Term Goal(s): Safe transition to appropriate next level of care at discharge, Engage patient in therapeutic group addressing interpersonal concerns.  Short Term Goals: Engage patient in aftercare planning with referrals and resources, Increase social support, Increase ability to appropriately verbalize feelings, Increase emotional regulation, Facilitate acceptance of mental health diagnosis and concerns, and Increase skills for wellness and recovery  Therapeutic Interventions: Assess for all discharge needs, 1 to 1 time with Social worker, Explore available resources and support systems, Assess for adequacy in community support network, Educate family and significant other(s) on suicide prevention, Complete Psychosocial Assessment, Interpersonal group therapy.  Evaluation of Outcomes: Progressing   Progress in Treatment: Attending groups: Yes. Participating in groups: Yes. Taking medication as prescribed: Yes. Toleration medication: Yes. Family/Significant other contact made: Yes, individual(s) contacted:  SPE completed with the patient.  Patient declined collateral and family contact.  Patient understands diagnosis: Yes. Discussing patient identified problems/goals with staff: Yes. Medical problems stabilized or resolved: Yes. Denies suicidal/homicidal ideation: Yes. Issues/concerns per patient self-inventory: No. Other: none  New problem(s) identified: No, Describe:  none.   New Short Term/Long Term Goal(s): medication management for mood stabilization; elimination of SI thoughts; development  of comprehensive mental wellness plan.  Update 12/20/2020  No changes at this time.    Patient Goals:  "I don't know what there is to work on. There's nothing here for me." Update 12/20/2020  No  changes at this time.    Discharge Plan or Barriers: CSW will assist pt with development of an appropriate aftercare/discharge plan. Update 12/20/2020  No changes at this time.    Reason for Continuation of Hospitalization: Anxiety Depression Medication stabilization  Estimated Length of Stay:  TBD   Scribe for Treatment Team: Gates Rigg 12/20/2020 9:32 AM

## 2020-12-20 NOTE — Plan of Care (Signed)
  Problem: Education: Goal: Knowledge of  General Education information/materials will improve Outcome: Progressing Goal: Emotional status will improve Outcome: Progressing Goal: Mental status will improve Outcome: Progressing Goal: Verbalization of understanding the information provided will improve Outcome: Progressing   Problem: Safety: Goal: Periods of time without injury will increase Outcome: Progressing   Problem: Education: Goal: Ability to make informed decisions regarding treatment will improve Outcome: Progressing   Problem: Medication: Goal: Compliance with prescribed medication regimen will improve Outcome: Progressing

## 2020-12-20 NOTE — Group Note (Signed)
LCSW Group Therapy Note   Patient on unit tested positive for COVID, group not held as precaution. Awaiting guidance from infection prevention. Situation ongoing, CSW will continue to monitor and update note as more information becomes available.   Durenda Hurt, LCSWA 12/20/2020  1:27 PM

## 2020-12-20 NOTE — Progress Notes (Signed)
Patient is pleasant and cooperative. Denies SI, HI and AVH

## 2020-12-21 LAB — GLUCOSE, CAPILLARY
Glucose-Capillary: 185 mg/dL — ABNORMAL HIGH (ref 70–99)
Glucose-Capillary: 202 mg/dL — ABNORMAL HIGH (ref 70–99)
Glucose-Capillary: 150 mg/dL — ABNORMAL HIGH (ref 70–99)

## 2020-12-21 NOTE — Plan of Care (Signed)
  Problem: Education: Goal: Knowledge of [facility] Education information/materials will improve Outcome: Progressing Goal: Emotional status will improve Outcome: Progressing Goal: Mental status will improve Outcome: Progressing Goal: Verbalization of understanding the information provided will improve Outcome: Progressing   Problem: Safety: Goal: Periods of time without injury will increase Outcome: Progressing   Problem: Education: Goal: Ability to make informed decisions regarding treatment will improve Outcome: Progressing

## 2020-12-21 NOTE — Progress Notes (Signed)
  [facility] Adult Case Management Discharge Plan :  Will you be returning to the same living situation after discharge:  Yes,  pt returning to pt's home At discharge, do you have transportation home?: Yes,  CSW will assist in scheduling transportation Do you have the ability to pay for your medications: Yes,  pt has LME Medicaid  Release of information consent forms completed and in the chart;  Patient's signature needed at discharge.  Patient to Follow up at:  Follow-up Information     [facility] Follow up on 12/22/2020.   Why: You have an appointment scheduled with Peer Support Specialist Sherrian Divers. He will pick you up at your home  Friday September 30th at 7am. Thanks! Contact information: [place] [address] [phone]                 Next level of care provider has access to [place] and Suicide Prevention discussed: Yes,  completed with pt     Has patient been referred to the Quitline?: N/A patient is not a smoker  Patient has been referred for addiction treatment: Pt. refused referral  Shar Paez A [place], [place] 12/21/2020, 9:15 AM

## 2020-12-21 NOTE — Discharge Summary (Signed)
Physician Discharge Summary Note  Patient:  Christian [name] is an 50 y.o., male MRN:  [id number] DOB:  11/07/1970 Patient phone:  [phone] (home)  Patient address:   [address] [place] [address],  Total Time spent with patient: 35 minutes- 25 minutes face-to-face contact with patient, 10 minutes documentation, coordination of care, scripts   Date of Admission:  12/14/2020 Date of Discharge: 12/21/2020  Reason for Admission:   50 year old man with a history of recurrent severe depression and alcohol abuse.  Patient came voluntarily to the hospital reporting worsening depression with active suicidal thought.  Principal Problem: Severe episode of recurrent major depressive disorder, without psychotic features ([place]) Discharge Diagnoses: Principal Problem:   Severe episode of recurrent major depressive disorder, without psychotic features ([place]) Active Problems:   CAD (coronary artery disease)   HTN (hypertension)   Hyperlipidemia   Diabetes mellitus, type II ([place])   Alcohol use disorder, moderate, dependence ([place])   MDD (major depressive disorder)   Deep vein thrombosis (DVT) ([place])   Past Psychiatric History:  Patient has had several prior hospitalizations and a history of chronic depression.  He has had at least one serious suicide attempt in the past.  History of intermittent alcohol abuse complicating his situation as well as a history of multiple medical problems.  Past Medical History:  Past Medical History:  Diagnosis Date   CAD (coronary artery disease)    a. 07/2013 NSTEMI/PCI: LCX 65m (4.0x23 Xience DES), RCA 32m, EF > 55%;  b. 07/2013 Echo: EF 60-65%, diast dysfxn, mild LVH, mildly dil LA, nl RV size/fxn, nl RVSP.   Diabetes mellitus without complication ([place])    History of tobacco abuse    a. quit ~ 2010   Hyperlipidemia    Morbid obesity ([place])    a. weighed 168 @ age 85.   Scoliosis    Stroke [facility])     Past Surgical History:  Procedure Laterality Date    ADENOIDECTOMY     CARDIAC CATHETERIZATION  07/2013   [facility]'x1 stent   Family History:  Family History  Problem Relation Age of Onset   Heart disease Mother    Esophageal cancer Father    Diabetes Sister    Family Psychiatric  History: Son with cannabis use disorder Social History:  Social History   Substance and Sexual Activity  Alcohol Use Yes   Comment: Drink half a bottle of vodka last Thursday.     Social History   Substance and Sexual Activity  Drug Use No   Types: Marijuana   Comment: past    Social History   Socioeconomic History   Marital status: Single    Spouse name: Not on file   Number of children: Not on file   Years of education: Not on file   Highest education level: Not on file  Occupational History   Not on file  Tobacco Use   Smoking status: Former    Types: Cigarettes   Smokeless tobacco: Never  Vaping Use   Vaping Use: Never used  Substance and Sexual Activity   Alcohol use: Yes    Comment: Drink half a bottle of vodka last Thursday.   Drug use: No    Types: Marijuana    Comment: past   Sexual activity: Not on file  Other Topics Concern   Not on file  Social History Narrative   Not on file   Social Determinants of Health   Financial Resource Strain: Not on file  Food Insecurity:  Not on file  Transportation Needs: Not on file  Physical Activity: Not on file  Stress: Not on file  Social Connections: Not on file    Hospital Course:  50 year old man with a history of recurrent severe depression and alcohol abuse.  Patient came voluntarily to the hospital reporting worsening depression with active suicidal thought. Patient started on mirtazapine and titrated to 30 mg nightly. He was given Eliquis for DVT diagnosed on 9/20. He was continued on home medications for medical issues. Mood gradually improved. He denies SI/HI/AH/VH. He is able to contract for safety returning home, and plans to follow-up with outpatient provider.   Physical  Findings: AIMS:  , ,  ,  ,    CIWA:    COWS:     Musculoskeletal: Strength & Muscle Tone: within normal limits Gait & Station: normal Patient leans: N/A   Psychiatric Specialty Exam:  Presentation  General Appearance: Appropriate for Environment; Casual  Eye Contact:Good  Speech:Clear and Coherent; Normal Rate  Speech Volume:Normal  Handedness:Right   Mood and Affect  Mood:Euthymic  Affect:Constricted   Thought Process  Thought Processes:Coherent; Goal Directed  Descriptions of Associations:Intact  Orientation:Full (Time, Place and Person)  Thought Content:Logical  History of Schizophrenia/Schizoaffective disorder:No data recorded Duration of Psychotic Symptoms:No data recorded Hallucinations:Hallucinations: None  Ideas of Reference:None  Suicidal Thoughts:Suicidal Thoughts: No  Homicidal Thoughts:Homicidal Thoughts: No   Sensorium  Memory:Immediate Fair; Recent Fair; Remote Fair  Judgment:Intact  Insight:Fair   Executive Functions  Concentration:Good  Attention Span:Good  [place] of Knowledge:Good  Language:Good   Psychomotor Activity  Psychomotor Activity:Psychomotor Activity: Normal   Assets  Assets:Communication Skills; Desire for Improvement; Financial Resources/Insurance; Housing; Resilience; Social Support   Sleep  Sleep:Sleep: Fair Number of Hours of Sleep: 7.45    Physical Exam: Physical Exam Vitals and nursing note reviewed.  Constitutional:      Appearance: Normal appearance.  HENT:     Head: Normocephalic and atraumatic.     Right Ear: External ear normal.     Left Ear: External ear normal.     Nose: Nose normal.     Mouth/Throat:     Mouth: Mucous membranes are moist.     Pharynx: Oropharynx is clear.  Eyes:     Extraocular Movements: Extraocular movements intact.     Conjunctiva/sclera: Conjunctivae normal.     Pupils: Pupils are equal, round, and reactive to light.  Cardiovascular:     Rate  and Rhythm: Normal rate.     Pulses: Normal pulses.  Pulmonary:     Effort: Pulmonary effort is normal.     Breath sounds: Normal breath sounds.  Abdominal:     General: Abdomen is flat.     Palpations: Abdomen is soft.  Musculoskeletal:        General: No swelling. Normal range of motion.     Cervical back: Normal range of motion and neck supple.  Skin:    General: Skin is warm and dry.  Neurological:     General: No focal deficit present.     Mental Status: He is alert and oriented to person, place, and time.  Psychiatric:        Mood and Affect: Mood normal.        Behavior: Behavior normal.        Thought Content: Thought content normal.        Judgment: Judgment normal.   Review of Systems  Constitutional: Negative.   HENT: Negative.    Eyes: Negative.  Respiratory: Negative.    Cardiovascular: Negative.   Gastrointestinal: Negative.   Genitourinary: Negative.   Musculoskeletal: Negative.   Skin: Negative.   Neurological: Negative.   Endo/Heme/Allergies: Negative.   Psychiatric/Behavioral:  Negative for hallucinations, memory loss and suicidal ideas. The patient does not have insomnia.   Blood pressure (!) 144/80, pulse 70, temperature 97.8 F (36.6 C), temperature source Oral, resp. rate 19, height 5\' 6"  (1.676 m), weight 77.1 kg, SpO2 (!) 89 %. Body mass index is 27.43 kg/m.   Social History   Tobacco Use  Smoking Status Former   Types: Cigarettes  Smokeless Tobacco Never   Tobacco Cessation:  N/A, patient does not currently use tobacco products   Blood Alcohol level:  Lab Results  Component Value Date   ETH <10 12/14/2020   ETH <10 41/93/7902    Metabolic Disorder Labs:  Lab Results  Component Value Date   HGBA1C 9.0 (H) 12/15/2020   MPG 212 12/15/2020   MPG 237.43 08/21/2019   No results found for: PROLACTIN Lab Results  Component Value Date   CHOL 126 10/20/2019   TRIG 175 (H) 10/20/2019   HDL 37 (L) 10/20/2019   CHOLHDL 3.4 10/20/2019    VLDL 42 (H) 08/21/2019   LDLCALC 60 10/20/2019   [place] 85 08/21/2019    See Psychiatric Specialty Exam and Suicide Risk Assessment completed by Attending Physician prior to discharge.  Discharge destination:  Home  Is patient on multiple antipsychotic therapies at discharge:  No   Has Patient had three or more failed trials of antipsychotic monotherapy by history:  No  Recommended Plan for Multiple Antipsychotic Therapies: NA  Discharge Instructions     Diet - low sodium heart healthy   Complete by: As directed    Increase activity slowly   Complete by: As directed       Allergies as of 12/21/2020   No Known Allergies      Medication List     TAKE these medications      Indication  apixaban 5 MG Tabs tablet Commonly known as: ELIQUIS Take 1 tablet (5 mg total) by mouth 2 (two) times daily. What changed:  how much to take how to take this when to take this additional instructions  Indication: Blood Clot in a Deep Vein   atorvastatin 40 MG tablet Commonly known as: LIPITOR Take 1 tablet (40 mg total) by mouth daily.  Indication: High Amount of Fats in the Blood   carvedilol 25 MG tablet Commonly known as: COREG Take 1 tablet (25 mg total) by mouth 2 (two) times daily with a meal. What changed: when to take this  Indication: High Blood Pressure Disorder   metFORMIN 1000 MG tablet Commonly known as: Glucophage Take 1 tablet (1,000 mg total) by mouth 2 (two) times daily with a meal.  Indication: Type 2 Diabetes   mirtazapine 30 MG tablet Commonly known as: REMERON Take 1 tablet (30 mg total) by mouth at bedtime.  Indication: Major Depressive Disorder         Follow-up recommendations:  Activity:  as tolerated Diet:  carb modified diet  Comments:  30-day scripts with [facility] [address] per request. IF suicidal ideations recur call 911 or proceed to nearest emergency room.   Signed: Salley Scarlet, MD 12/21/2020, 9:14  AM

## 2020-12-21 NOTE — Progress Notes (Signed)
Recreation Therapy Notes  Date: 12/21/2020  Time: 10:00 am   Location: Craft room   Behavioral response: N/A   Intervention Topic: Goals   Discussion/Intervention: Patient did not attend group.   Clinical Observations/Feedback:  Patient did not attend group.   Shuntia Exton LRT/CTRS        Dezyrae Kensinger 12/21/2020 12:01 PM

## 2020-12-21 NOTE — Progress Notes (Signed)
D: Pt alert and oriented. Pt denies experiencing any pain, SI/HI, or AVH at this time. Pt reports he will be able to keep himself safe when he returns home.   A: Pt received discharge and medication education/information. Pt belongings were returned and signed for at this time, to include supply of medications.   R: Pt verbalized understanding of discharge and medication education/information.  Pt escorted by staff to physician's on call parking where pt is picked up and transported home.

## 2020-12-21 NOTE — Progress Notes (Signed)
Patient is pleasant and cooperative. He denies si/hi/avh and anxiety at this encounter.  Patient does endorse both depression and pain rating depression at 3/10 and his leg pain in the right calf.  He has been active on the unit spending time with his peers in the dayroom.  He is safe with q15 minute safety checks. Encouraged him to seek nursing staff with any concerns.   Cleo Butler-Nicholson, LPN

## 2020-12-21 NOTE — Plan of Care (Signed)
  Problem: Leisure Education Goal: STG - Patient will identify 3 healthy leisure activities that can be utilized post d/c within 5 recreation therapy group sessions Description: STG - Patient will identify 3 healthy leisure activities that can be utilized post d/c within 5 recreation therapy group sessions 12/21/2020 1410 by Ernest Haber, LRT Outcome: Adequate for Discharge 12/21/2020 1410 by Ernest Haber, LRT Outcome: Adequate for Discharge

## 2020-12-21 NOTE — Progress Notes (Signed)
Recreation Therapy Notes  INPATIENT RECREATION TR PLAN  Patient Details Name: CORT DRAGOO MRN: [id number] DOB: 12-17-1970 Today's Date: 12/21/2020  Rec Therapy Plan Is patient appropriate for Therapeutic Recreation?: Yes Treatment times per week: at least 3 Estimated Length of Stay: 5-7 days TR Treatment/Interventions: Group participation (Comment)  Discharge Criteria Pt will be discharged from therapy if:: Discharged Treatment plan/goals/alternatives discussed and agreed upon by:: Patient/family  Discharge Summary Short term goals set: Patient will identify 3 healthy leisure activities that can be utilized post d/c within 5 recreation therapy group sessions Short term goals met: Adequate for discharge Progress toward goals comments: Groups attended Which groups?: Leisure education, Other (Comment) (Time managment) Reason goals not met: N/A Therapeutic equipment acquired: N/A Reason patient discharged from therapy: Discharge from hospital Pt/family agrees with progress & goals achieved: Yes Date patient discharged from therapy: 12/21/20   Orhan Mayorga 12/21/2020, 2:11 PM

## 2020-12-21 NOTE — BHH Suicide Risk Assessment (Signed)
[facility] Discharge Suicide Risk Assessment   Principal Problem: Severe episode of recurrent major depressive disorder, without psychotic features ([place]) Discharge Diagnoses: Principal Problem:   Severe episode of recurrent major depressive disorder, without psychotic features ([place]) Active Problems:   CAD (coronary artery disease)   HTN (hypertension)   Hyperlipidemia   Diabetes mellitus, type II ([place])   Alcohol use disorder, moderate, dependence ([place])   MDD (major depressive disorder)   Deep vein thrombosis (DVT) ([place])   Total Time spent with patient: 35 minutes- 25 minutes face-to-face contact with patient, 10 minutes documentation, coordination of care, scripts   Musculoskeletal: Strength & Muscle Tone: within normal limits Gait & Station: normal Patient leans: N/A  Psychiatric Specialty Exam  Presentation  General Appearance: Appropriate for Environment; Casual  Eye Contact:Good  Speech:Clear and Coherent; Normal Rate  Speech Volume:Normal  Handedness:Right   Mood and Affect  Mood:Euthymic  Duration of Depression Symptoms: No data recorded Affect:Constricted   Thought Process  Thought Processes:Coherent; Goal Directed  Descriptions of Associations:Intact  Orientation:Full (Time, Place and Person)  Thought Content:Logical  History of Schizophrenia/Schizoaffective disorder:No data recorded Duration of Psychotic Symptoms:No data recorded Hallucinations:Hallucinations: None  Ideas of Reference:None  Suicidal Thoughts:Suicidal Thoughts: No  Homicidal Thoughts:Homicidal Thoughts: No   Sensorium  Memory:Immediate Fair; Recent Fair; Remote Fair  Judgment:Intact  Insight:Fair   Executive Functions  Concentration:Good  Attention Span:Good  [place] of Knowledge:Good  Language:Good   Psychomotor Activity  Psychomotor Activity:Psychomotor Activity: Normal   Assets  Assets:Communication Skills; Desire for Improvement; Financial  Resources/Insurance; Housing; Resilience; Social Support   Sleep  Sleep:Sleep: Fair Number of Hours of Sleep: 7.45   Physical Exam: Physical Exam ROS Blood pressure (!) 144/80, pulse 70, temperature 97.8 F (36.6 C), temperature source Oral, resp. rate 19, height 5\' 6"  (1.676 m), weight 77.1 kg, SpO2 (!) 89 %. Body mass index is 27.43 kg/m.  Mental Status Per Nursing Assessment::   On Admission:  Suicidal ideation indicated by patient  Demographic Factors:  Male and Caucasian  Loss Factors: Legal issues  Historical Factors: Prior suicide attempts  Risk Reduction Factors:   Sense of responsibility to family, Living with another person, especially a relative, Positive social support, Positive therapeutic relationship, and Positive coping skills or problem solving skills  Continued Clinical Symptoms:  Depression:   Comorbid alcohol abuse/dependence Recent sense of peace/wellbeing Alcohol/Substance Abuse/Dependencies Previous Psychiatric Diagnoses and Treatments Medical Diagnoses and Treatments/Surgeries  Cognitive Features That Contribute To Risk:  None    Suicide Risk:  Mild:  Suicidal ideation of limited frequency, intensity, duration, and specificity.  There are no identifiable plans, no associated intent, mild dysphoria and related symptoms, good self-control (both objective and subjective assessment), few other risk factors, and identifiable protective factors, including available and accessible social support.    Plan Of Care/Follow-up recommendations:  Activity:  as tolerated Diet:  carb modified diet  Salley Scarlet, MD 12/21/2020, 9:11 AM

## 2020-12-21 NOTE — Progress Notes (Signed)
D: Pt alert and oriented. Pt denies experiencing any anxiety/depression at this time. Pt reports 2/10 Right calf pain, prn's offered and refused by pt. Pt denies experiencing any SI/HI, or AVH at this time.   Pt voices he will be discharging today to go back and live with son. Pt is calm and cooperative.  A: Scheduled medications administered to pt, per MD orders. Support and encouragement provided. Frequent verbal contact made. Routine safety checks conducted q15 minutes.   R: No adverse drug reactions noted. Pt verbally contracts for safety at this time. Pt complaint with medications and treatment plan. Pt interacts well with others on the unit. Pt remains safe at this time. Will continue to monitor.

## 2020-12-26 ENCOUNTER — Other Ambulatory Visit: Payer: Self-pay

## 2021-01-02 ENCOUNTER — Telehealth: Payer: Self-pay | Admitting: Gerontology

## 2021-01-02 NOTE — Telephone Encounter (Signed)
-----   Message from Langston Reusing, NP sent at 01/02/2021 10:15 AM EDT ----- Pls schedule an in clinic appointment for Mr Buhl and make telephone note. Thank you

## 2021-01-11 ENCOUNTER — Ambulatory Visit: Payer: Medicaid Other | Admitting: Gerontology

## 2021-04-27 ENCOUNTER — Emergency Department
Admission: EM | Admit: 2021-04-27 | Discharge: 2021-04-27 | Disposition: A | Discharge status: Home / Self Care | Payer: Medicaid Other | Attending: Emergency Medicine | Admitting: Emergency Medicine

## 2021-04-27 ENCOUNTER — Other Ambulatory Visit: Payer: Self-pay

## 2021-04-27 ENCOUNTER — Encounter: Payer: Self-pay | Admitting: Intensive Care

## 2021-04-27 DIAGNOSIS — I10 Essential (primary) hypertension: Secondary | ICD-10-CM | POA: Insufficient documentation

## 2021-04-27 DIAGNOSIS — Z7901 Long term (current) use of anticoagulants: Secondary | ICD-10-CM | POA: Diagnosis not present

## 2021-04-27 DIAGNOSIS — Z7984 Long term (current) use of oral hypoglycemic drugs: Secondary | ICD-10-CM | POA: Insufficient documentation

## 2021-04-27 DIAGNOSIS — Z760 Encounter for issue of repeat prescription: Secondary | ICD-10-CM | POA: Insufficient documentation

## 2021-04-27 DIAGNOSIS — E1165 Type 2 diabetes mellitus with hyperglycemia: Secondary | ICD-10-CM | POA: Insufficient documentation

## 2021-04-27 DIAGNOSIS — Z79899 Other long term (current) drug therapy: Secondary | ICD-10-CM | POA: Insufficient documentation

## 2021-04-27 DIAGNOSIS — I2510 Atherosclerotic heart disease of native coronary artery without angina pectoris: Secondary | ICD-10-CM | POA: Insufficient documentation

## 2021-04-27 LAB — CBC
HCT: 45.3 % (ref 39.0–52.0)
Hemoglobin: 15.5 g/dL (ref 13.0–17.0)
MCH: 29.5 pg (ref 26.0–34.0)
MCHC: 34.2 g/dL (ref 30.0–36.0)
MCV: 86.3 fL (ref 80.0–100.0)
Platelets: 284 10*3/uL (ref 150–400)
RBC: 5.25 MIL/uL (ref 4.22–5.81)
RDW: 12.6 % (ref 11.5–15.5)
WBC: 8.4 10*3/uL (ref 4.0–10.5)
nRBC: 0 % (ref 0.0–0.2)

## 2021-04-27 LAB — BASIC METABOLIC PANEL
Anion gap: 10 (ref 5–15)
BUN: 9 mg/dL (ref 6–20)
CO2: 29 mmol/L (ref 22–32)
Calcium: 9.8 mg/dL (ref 8.9–10.3)
Chloride: 97 mmol/L — ABNORMAL LOW (ref 98–111)
Creatinine, Ser: 0.87 mg/dL (ref 0.61–1.24)
GFR, Estimated: >60 mL/min (ref >60)
Glucose, Bld: 306 mg/dL — ABNORMAL HIGH (ref 70–99)
Potassium: 4.4 mmol/L (ref 3.5–5.1)
Sodium: 136 mmol/L (ref 135–145)

## 2021-04-27 LAB — CBG MONITORING, ED: Glucose-Capillary: 294 mg/dL — ABNORMAL HIGH (ref 70–99)

## 2021-04-27 MED ORDER — METFORMIN HCL 500 MG PO TABS
500.0000 mg | ORAL_TABLET | Freq: Once | ORAL | Status: AC
  Administered 2021-04-27: 500 mg via ORAL
  Filled 2021-04-27: qty 1

## 2021-04-27 MED ORDER — CARVEDILOL 25 MG PO TABS: 25.0000 mg | ORAL_TABLET | Freq: Two times a day (BID) | ORAL | 3 refills | Status: DC

## 2021-04-27 MED ORDER — MIRTAZAPINE 15 MG PO TABS
30.0000 mg | ORAL_TABLET | Freq: Once | ORAL | Status: AC
  Administered 2021-04-27: 30 mg via ORAL
  Filled 2021-04-27: qty 2

## 2021-04-27 MED ORDER — METFORMIN HCL 1000 MG PO TABS: 1000.0000 mg | ORAL_TABLET | Freq: Two times a day (BID) | ORAL | 3 refills | Status: DC

## 2021-04-27 MED ORDER — APIXABAN 5 MG PO TABS: 5.0000 mg | ORAL_TABLET | Freq: Two times a day (BID) | ORAL | 3 refills | Status: DC

## 2021-04-27 MED ORDER — ATORVASTATIN CALCIUM 40 MG PO TABS: 40.0000 mg | ORAL_TABLET | Freq: Every day | ORAL | 3 refills | Status: DC

## 2021-04-27 MED ORDER — MIRTAZAPINE 30 MG PO TABS: 30.0000 mg | ORAL_TABLET | Freq: Every day | ORAL | 3 refills | Status: DC

## 2021-04-27 MED ORDER — APIXABAN 5 MG PO TABS
5.0000 mg | ORAL_TABLET | Freq: Once | ORAL | Status: AC
  Administered 2021-04-27: 5 mg via ORAL
  Filled 2021-04-27: qty 1

## 2021-04-27 MED ORDER — CARVEDILOL 6.25 MG PO TABS
25.0000 mg | ORAL_TABLET | Freq: Once | ORAL | Status: AC
  Administered 2021-04-27: 25 mg via ORAL
  Filled 2021-04-27: qty 4

## 2021-04-27 MED ORDER — ATORVASTATIN CALCIUM 20 MG PO TABS
40.0000 mg | ORAL_TABLET | Freq: Once | ORAL | Status: AC
  Administered 2021-04-27: 40 mg via ORAL
  Filled 2021-04-27: qty 2

## 2021-04-27 NOTE — ED Triage Notes (Signed)
Patient presents with hypertension and hyperglycemia. Vtials while being seen at [facility] 197/121 blood pressure, 83HR, blood suagr 337, and 99% O2 RA. Sent here by [facility] after checking v/s. Patient denies SI/HI
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
## 2021-04-27 NOTE — ED Provider Notes (Signed)
[facility] Provider Note  Patient Contact: 4:28 PM (approximate)   History   Hypertension and Hyperglycemia   HPI  Christian [name] is a 51 y.o. male who presents to the emergency department from [facility] [place] [facility] after having the blood pressure taking with an elevated reading today.  Patient states that he has a history of hypertension, coronary artery disease, diabetes.  Patient is supposed to be on Eliquis, Lipitor, Coreg, metformin and Remeron.  Patient states that [facility], was able to obtain [facility] and was [facility] to [facility].  He has made an appointment for [facility] to be seen to have his current medical issues managed but is currently awaiting disability.  He has no money income at this time, cannot afford his medications.  He went to [place] hoping that he could donate plasma today for somebody but was advised to present to the ED given his blood pressure.  He has not taking his antihypertensive medications or any of his other medications at this time as he cannot afford them.  Patient denies any complaints at this time.  Denies any URI symptoms, chest pain, shortness of breath, headache, visual changes, GI symptoms.     Physical Exam   Triage Vital Signs: ED Triage Vitals [04/27/21 1429]  Enc Vitals Group     BP (!) 209/118     Pulse Rate 88     Resp 16     Temp 98.6 F (37 C)     Temp Source Oral     SpO2 98 %     Weight 170 lb (77.1 kg)     Height 5\' 6"  (1.676 m)     Head Circumference      Peak Flow      Pain Score 0     Pain Loc      Pain Edu?      Excl. in [place]?     Most recent vital signs: Vitals:   04/27/21 1429  BP: (!) 209/118  Pulse: 88  Resp: 16  Temp: 98.6 F (37 C)  SpO2: 98%     General: Alert and in no acute distress.  Neck: No stridor. No cervical spine tenderness to palpation.  Cardiovascular:  Good peripheral perfusion Respiratory: Normal respiratory effort without tachypnea  or retractions. Lungs CTAB. Good air entry to the bases with no decreased or absent breath sounds. Gastrointestinal: Bowel sounds 4 quadrants. Soft and nontender to palpation. No guarding or rigidity. No palpable masses. No distention. No CVA tenderness. Musculoskeletal: Full range of motion to all extremities.  Neurologic:  No gross focal neurologic deficits are appreciated. Cranial nerves II-XII grossly intact Skin:   No rash noted Other:   ED Results / Procedures / Treatments   Labs (all labs ordered are listed, but only abnormal results are displayed) Labs Reviewed  BASIC METABOLIC PANEL - Abnormal; Notable for the following components:      Result Value   Chloride 97 (*)    Glucose, Bld 306 (*)    All other components within normal limits  CBG MONITORING, ED - Abnormal; Notable for the following components:   Glucose-Capillary 294 (*)    All other components within normal limits  CBC  URINALYSIS, ROUTINE W REFLEX MICROSCOPIC  CBG MONITORING, ED  CBG MONITORING, ED     EKG     RADIOLOGY    No results found.  PROCEDURES:  Critical Care performed: No  Procedures  MEDICATIONS ORDERED IN ED: Medications  apixaban (ELIQUIS) tablet 5 mg (has no administration in time range)  atorvastatin (LIPITOR) tablet 40 mg (has no administration in time range)  carvedilol (COREG) tablet 25 mg (has no administration in time range)  metFORMIN (GLUCOPHAGE) tablet 500 mg (has no administration in time range)  mirtazapine (REMERON) tablet 30 mg (has no administration in time range)     IMPRESSION / MDM / ASSESSMENT AND PLAN / ED COURSE  I reviewed the triage vital signs and the nursing notes.                              Differential diagnosis includes, but is not limited to, hypertension, hypertensive urgency, malignant hypertension, medication refill   Patient's diagnosis is consistent with hypertension and medication refill.  Patient presented to the emergency  department after having elevated blood pressure readings at [facility]  Patient mistakenly went there thinking that he could donate plasma for Monday.  Patient is currently attempting to receive disability and has had no income.  Reportedly patient was unable to fill his prescription medications due to cost.  He recently received Medicaid and was released from his primary care which was a free service but not he has Medicaid he must seek new care.  Apparently patient had prescriptions with only 1 refill prescribed in September.  He states that he had medication at the pharmacy but was unable to pick up his prescriptions.  I called social work and discussed the patient with social work and they advised with his Medicaid that the average cost of prescription should be $2-$3.  Patient states that he would be out of for this at a pharmacy and I have represcribed his medications.  He had no complaints at this time.  He is given a dose of all of his chronic medications here in the emergency department.  Patient declined any further work-up stating that he just needed his medications filled while he is waiting for his disability to go through.  Feel this is reasonable as he is able to competently make decisions at this time.  Again we have given him a dose of all of his medications today, sent in prescriptions to his pharmacy and states that he can afford his medications with his Medicaid.  Return precautions discussed with the patient.  Follow-up with primary care which she has a new appointment for a new primary care in 2 weeks time.. Patient is given ED precautions to return to the ED for any worsening or new symptoms.        FINAL CLINICAL IMPRESSION(S) / ED DIAGNOSES   Final diagnoses:  Primary hypertension  Medication refill     Rx / DC Orders   ED Discharge Orders          Ordered    apixaban (ELIQUIS) 5 MG TABS tablet  2 times daily        04/27/21 1656    atorvastatin (LIPITOR) 40 MG tablet  Daily         04/27/21 1656    carvedilol (COREG) 25 MG tablet  2 times daily with meals        04/27/21 1656    metFORMIN (GLUCOPHAGE) 1000 MG tablet  2 times daily with meals        04/27/21 1656    mirtazapine (REMERON) 30 MG tablet  Daily at bedtime        04/27/21 1656  Note:  This document was prepared using Dragon voice recognition software and may include unintentional dictation errors.   Brynda Peon 04/27/21 1658    Carrie Mew, MD 04/27/21 [phone]

## 2021-08-31 DIAGNOSIS — E1140 Type 2 diabetes mellitus with diabetic neuropathy, unspecified: Secondary | ICD-10-CM | POA: Diagnosis not present

## 2021-08-31 DIAGNOSIS — I10 Essential (primary) hypertension: Secondary | ICD-10-CM | POA: Diagnosis not present

## 2021-08-31 DIAGNOSIS — E785 Hyperlipidemia, unspecified: Secondary | ICD-10-CM | POA: Diagnosis not present
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
## 2021-10-15 ENCOUNTER — Ambulatory Visit (INDEPENDENT_AMBULATORY_CARE_PROVIDER_SITE_OTHER): Payer: Medicare HMO | Admitting: Cardiovascular Disease

## 2021-10-15 ENCOUNTER — Encounter: Payer: Self-pay | Admitting: Cardiovascular Disease

## 2021-10-15 VITALS — BP 92/60 | HR 91 | Ht 66.0 in | Wt 178.2 lb

## 2021-10-15 DIAGNOSIS — I10 Essential (primary) hypertension: Secondary | ICD-10-CM | POA: Diagnosis not present

## 2021-10-15 DIAGNOSIS — E782 Mixed hyperlipidemia: Secondary | ICD-10-CM

## 2021-10-15 DIAGNOSIS — I25118 Atherosclerotic heart disease of native coronary artery with other forms of angina pectoris: Secondary | ICD-10-CM

## 2021-10-15 DIAGNOSIS — I639 Cerebral infarction, unspecified: Secondary | ICD-10-CM | POA: Diagnosis not present

## 2021-10-15 MED ORDER — LISINOPRIL 20 MG PO TABS: 20.0000 mg | ORAL_TABLET | Freq: Every day | ORAL | 3 refills | Status: DC

## 2021-10-15 MED ORDER — CARVEDILOL 25 MG PO TABS: 25.0000 mg | ORAL_TABLET | Freq: Two times a day (BID) | ORAL | 3 refills | Status: DC

## 2021-10-15 MED ORDER — APIXABAN 2.5 MG PO TABS: 2.5000 mg | ORAL_TABLET | Freq: Two times a day (BID) | ORAL | 3 refills | Status: DC

## 2021-10-15 MED ORDER — ATORVASTATIN CALCIUM 80 MG PO TABS: 80.0000 mg | ORAL_TABLET | Freq: Every day | ORAL | 3 refills | Status: DC

## 2021-10-15 NOTE — Patient Instructions (Addendum)
Medication Instructions:  Decrease the eliquis down to 2.5 mg twice a day, for DVT prevention  If you need a refill on your cardiac medications before your next appointment, please call your pharmacy.   Lab work: No new labs needed  Testing/Procedures: No new testing needed  Follow-Up: At [facility], you and your health needs are our priority.  As part of our continuing mission to provide you with exceptional heart care, we have created designated Provider Care Teams.  These Care Teams include your primary Cardiologist (physician) and Advanced Practice Providers (APPs -  Physician Assistants and Nurse Practitioners) who all work together to provide you with the care you need, when you need it.  You will need a follow up appointment in 12 months  Providers on your designated Care Team:   Murray Hodgkins, NP Christell Faith, PA-C Cadence Kathlen Mody, [place]  COVID-19 Vaccine Information can be found at: [email or web address] For questions related to vaccine distribution or appointments, please email [email or web address] or call [phone].

## 2021-12-21 DIAGNOSIS — F331 Major depressive disorder, recurrent, moderate: Secondary | ICD-10-CM | POA: Diagnosis not present

## 2022-02-01 DIAGNOSIS — I2510 Atherosclerotic heart disease of native coronary artery without angina pectoris: Secondary | ICD-10-CM | POA: Diagnosis not present

## 2022-02-01 DIAGNOSIS — E1140 Type 2 diabetes mellitus with diabetic neuropathy, unspecified: Secondary | ICD-10-CM | POA: Diagnosis not present

## 2022-02-01 DIAGNOSIS — Z0000 Encounter for general adult medical examination without abnormal findings: Secondary | ICD-10-CM | POA: Diagnosis not present

## 2022-02-01 DIAGNOSIS — Z125 Encounter for screening for malignant neoplasm of prostate: Secondary | ICD-10-CM | POA: Diagnosis not present

## 2022-02-01 DIAGNOSIS — Z86718 Personal history of other venous thrombosis and embolism: Secondary | ICD-10-CM | POA: Diagnosis not present

## 2022-02-01 DIAGNOSIS — Z1331 Encounter for screening for depression: Secondary | ICD-10-CM | POA: Diagnosis not present

## 2022-02-01 DIAGNOSIS — I10 Essential (primary) hypertension: Secondary | ICD-10-CM | POA: Diagnosis not present

## 2022-02-01 DIAGNOSIS — I824Z1 Acute embolism and thrombosis of unspecified deep veins of right distal lower extremity: Secondary | ICD-10-CM | POA: Diagnosis not present

## 2022-02-01 DIAGNOSIS — E785 Hyperlipidemia, unspecified: Secondary | ICD-10-CM | POA: Diagnosis not present

## 2022-02-06 DIAGNOSIS — E119 Type 2 diabetes mellitus without complications: Secondary | ICD-10-CM | POA: Diagnosis not present

## 2022-02-18 DIAGNOSIS — E785 Hyperlipidemia, unspecified: Secondary | ICD-10-CM | POA: Diagnosis not present

## 2022-02-18 DIAGNOSIS — F329 Major depressive disorder, single episode, unspecified: Secondary | ICD-10-CM | POA: Diagnosis not present

## 2022-02-18 DIAGNOSIS — I69351 Hemiplegia and hemiparesis following cerebral infarction affecting right dominant side: Secondary | ICD-10-CM | POA: Diagnosis not present

## 2022-02-18 DIAGNOSIS — I10 Essential (primary) hypertension: Secondary | ICD-10-CM | POA: Diagnosis not present

## 2022-02-18 DIAGNOSIS — F1021 Alcohol dependence, in remission: Secondary | ICD-10-CM | POA: Diagnosis not present

## 2022-02-18 DIAGNOSIS — I2510 Atherosclerotic heart disease of native coronary artery without angina pectoris: Secondary | ICD-10-CM | POA: Diagnosis not present

## 2022-02-18 DIAGNOSIS — E1140 Type 2 diabetes mellitus with diabetic neuropathy, unspecified: Secondary | ICD-10-CM | POA: Diagnosis not present

## 2022-02-19 DIAGNOSIS — F331 Major depressive disorder, recurrent, moderate: Secondary | ICD-10-CM | POA: Diagnosis not present

## 2022-03-11 DIAGNOSIS — Z1211 Encounter for screening for malignant neoplasm of colon: Secondary | ICD-10-CM | POA: Diagnosis not present

## 2022-03-11 DIAGNOSIS — Z7901 Long term (current) use of anticoagulants: Secondary | ICD-10-CM | POA: Diagnosis not present

## 2022-08-08 DIAGNOSIS — I10 Essential (primary) hypertension: Secondary | ICD-10-CM | POA: Diagnosis not present

## 2022-08-13 DIAGNOSIS — I2510 Atherosclerotic heart disease of native coronary artery without angina pectoris: Secondary | ICD-10-CM | POA: Diagnosis not present

## 2022-08-13 DIAGNOSIS — E1140 Type 2 diabetes mellitus with diabetic neuropathy, unspecified: Secondary | ICD-10-CM | POA: Diagnosis not present

## 2022-08-13 DIAGNOSIS — I69351 Hemiplegia and hemiparesis following cerebral infarction affecting right dominant side: Secondary | ICD-10-CM | POA: Diagnosis not present

## 2022-08-13 DIAGNOSIS — E785 Hyperlipidemia, unspecified: Secondary | ICD-10-CM | POA: Diagnosis not present

## 2022-08-13 DIAGNOSIS — I10 Essential (primary) hypertension: Secondary | ICD-10-CM | POA: Diagnosis not present

## 2022-08-13 DIAGNOSIS — I824Z1 Acute embolism and thrombosis of unspecified deep veins of right distal lower extremity: Secondary | ICD-10-CM | POA: Diagnosis not present

## 2022-08-29 ENCOUNTER — Encounter: Payer: Self-pay | Admitting: Gastroenterology

## 2022-09-04 ENCOUNTER — Encounter: Payer: Self-pay | Admitting: Gastroenterology

## 2022-09-05 ENCOUNTER — Ambulatory Visit
Admission: RE | Admit: 2022-09-05 | Discharge: 2022-09-05 | Disposition: A | Discharge status: Home / Self Care | Payer: Medicare HMO | Source: Ambulatory Visit | Attending: Gastroenterology | Admitting: Gastroenterology

## 2022-09-05 ENCOUNTER — Ambulatory Visit: Payer: Medicare HMO | Admitting: Certified Registered"

## 2022-09-05 ENCOUNTER — Encounter: Payer: Self-pay | Admitting: Gastroenterology

## 2022-09-05 ENCOUNTER — Encounter
Admission: RE | Disposition: A | Discharge status: Home / Self Care | Payer: Self-pay | Source: Ambulatory Visit | Attending: Gastroenterology

## 2022-09-05 DIAGNOSIS — Z87891 Personal history of nicotine dependence: Secondary | ICD-10-CM | POA: Insufficient documentation

## 2022-09-05 DIAGNOSIS — D122 Benign neoplasm of ascending colon: Secondary | ICD-10-CM | POA: Diagnosis not present

## 2022-09-05 DIAGNOSIS — E119 Type 2 diabetes mellitus without complications: Secondary | ICD-10-CM | POA: Insufficient documentation

## 2022-09-05 DIAGNOSIS — I10 Essential (primary) hypertension: Secondary | ICD-10-CM | POA: Insufficient documentation

## 2022-09-05 DIAGNOSIS — I2510 Atherosclerotic heart disease of native coronary artery without angina pectoris: Secondary | ICD-10-CM | POA: Diagnosis not present

## 2022-09-05 DIAGNOSIS — K635 Polyp of colon: Secondary | ICD-10-CM | POA: Diagnosis not present

## 2022-09-05 DIAGNOSIS — Z7984 Long term (current) use of oral hypoglycemic drugs: Secondary | ICD-10-CM | POA: Diagnosis not present

## 2022-09-05 DIAGNOSIS — Z7901 Long term (current) use of anticoagulants: Secondary | ICD-10-CM | POA: Diagnosis not present

## 2022-09-05 DIAGNOSIS — Z8673 Personal history of transient ischemic attack (TIA), and cerebral infarction without residual deficits: Secondary | ICD-10-CM | POA: Diagnosis not present

## 2022-09-05 DIAGNOSIS — J449 Chronic obstructive pulmonary disease, unspecified: Secondary | ICD-10-CM | POA: Insufficient documentation

## 2022-09-05 DIAGNOSIS — Z1211 Encounter for screening for malignant neoplasm of colon: Secondary | ICD-10-CM | POA: Insufficient documentation

## 2022-09-05 DIAGNOSIS — Z955 Presence of coronary angioplasty implant and graft: Secondary | ICD-10-CM | POA: Insufficient documentation

## 2022-09-05 DIAGNOSIS — R197 Diarrhea, unspecified: Secondary | ICD-10-CM | POA: Diagnosis not present

## 2022-09-05 HISTORY — PX: COLONOSCOPY: SHX5424

## 2022-09-05 LAB — GLUCOSE, CAPILLARY: Glucose-Capillary: 144 mg/dL — ABNORMAL HIGH (ref 70–99)

## 2022-09-05 SURGERY — COLONOSCOPY
Anesthesia: General

## 2022-09-05 MED ORDER — PROPOFOL 10 MG/ML IV BOLUS
INTRAVENOUS | Status: DC | PRN
  Administered 2022-09-05: 70 mg via INTRAVENOUS
  Administered 2022-09-05: 30 mg via INTRAVENOUS

## 2022-09-05 MED ORDER — STERILE WATER FOR IRRIGATION IR SOLN
Status: DC | PRN
  Administered 2022-09-05: 240 mL

## 2022-09-05 MED ORDER — PHENYLEPHRINE 80 MCG/ML (10ML) SYRINGE FOR IV PUSH (FOR BLOOD PRESSURE SUPPORT)
PREFILLED_SYRINGE | INTRAVENOUS | Status: DC | PRN
  Administered 2022-09-05 (×2): 160 ug via INTRAVENOUS
  Administered 2022-09-05: 80 ug via INTRAVENOUS

## 2022-09-05 MED ORDER — GLYCOPYRROLATE 0.2 MG/ML IJ SOLN
INTRAMUSCULAR | Status: DC | PRN
  Administered 2022-09-05: .2 mg via INTRAVENOUS

## 2022-09-05 MED ORDER — SODIUM CHLORIDE 0.9 % IV SOLN: INTRAVENOUS | Status: DC

## 2022-09-05 MED ORDER — LIDOCAINE HCL (CARDIAC) PF 100 MG/5ML IV SOSY
PREFILLED_SYRINGE | INTRAVENOUS | Status: DC | PRN
  Administered 2022-09-05: 100 mg via INTRAVENOUS

## 2022-09-05 MED ORDER — PROPOFOL 500 MG/50ML IV EMUL
INTRAVENOUS | Status: DC | PRN
  Administered 2022-09-05: 165 ug/kg/min via INTRAVENOUS

## 2022-09-05 MED ORDER — EPHEDRINE SULFATE (PRESSORS) 50 MG/ML IJ SOLN
INTRAMUSCULAR | Status: DC | PRN
  Administered 2022-09-05: 10 mg via INTRAVENOUS

## 2022-09-05 NOTE — H&P (Signed)
Pre-Procedure H&P   Patient ID: Christian [name] is a 52 y.o. male.  Gastroenterology Provider: Jaynie Collins, DO  Referring Provider: Tawni Pummel, PA PCP: Dorothey Baseman, MD  Date: 09/05/2022  HPI Christian [name] is a 52 y.o. male who presents today for Colonoscopy for Colorectal cancer screening .  Initial colorectal cancer screening.  No family history of colon cancer or colon polyps.  Regular bowel movements without melena or hematochezia.  Still having loose stools despite stopping metformin. Denies any constipation.  On Eliquis and Trulicity which have been held for this procedure  Hemoglobin 15.4 MCV 98.7 platelets 234,000 creatinine 0.9  Past Medical History:  Diagnosis Date   CAD (coronary artery disease)    a. 07/2013 NSTEMI/PCI: LCX 14m (4.0x23 Xience DES), RCA 67m, EF > 55%;  b. 07/2013 Echo: EF 60-65%, diast dysfxn, mild LVH, mildly dil LA, nl RV size/fxn, nl RVSP.   Diabetes mellitus without complication (HCC)    History of tobacco abuse    a. quit ~ 2010   Hyperlipidemia    Morbid obesity (HCC)    a. weighed 168 @ age 30.   Scoliosis    Stroke [facility])     Past Surgical History:  Procedure Laterality Date   ADENOIDECTOMY     CARDIAC CATHETERIZATION  07/23/2013   [facility]'x1 stent   CORONARY ANGIOPLASTY     with stent    Family History No h/o GI disease or malignancy  Review of Systems  Constitutional:  Negative for activity change, appetite change, chills, diaphoresis, fatigue, fever and unexpected weight change.  HENT:  Negative for trouble swallowing and voice change.   Respiratory:  Negative for shortness of breath and wheezing.   Cardiovascular:  Negative for chest pain, palpitations and leg swelling.  Gastrointestinal:  Negative for abdominal distention, abdominal pain, anal bleeding, blood in stool, constipation, diarrhea, nausea and vomiting.  Musculoskeletal:  Negative for arthralgias and myalgias.  Skin:  Negative for color change and  pallor.  Neurological:  Negative for dizziness, syncope and weakness.  Psychiatric/Behavioral:  Negative for confusion. The patient is not nervous/anxious.   All other systems reviewed and are negative.    Medications No current facility-administered medications on file prior to encounter.   Current Outpatient Medications on File Prior to Encounter  Medication Sig Dispense Refill   carvedilol (COREG) 25 MG tablet Take 1 tablet (25 mg total) by mouth 2 (two) times daily with a meal. 180 tablet 3   glimepiride (AMARYL) 1 MG tablet Take 1 mg by mouth 2 (two) times daily.     lisinopril (ZESTRIL) 20 MG tablet Take 1 tablet (20 mg total) by mouth daily. 90 tablet 3   apixaban (ELIQUIS) 2.5 MG TABS tablet Take 1 tablet (2.5 mg total) by mouth 2 (two) times daily. 180 tablet 3   atorvastatin (LIPITOR) 80 MG tablet Take 1 tablet (80 mg total) by mouth daily. 90 tablet 3   mirtazapine (REMERON) 30 MG tablet Take 1 tablet (30 mg total) by mouth at bedtime. 30 tablet 3      Pertinent medications related to GI and procedure were reviewed by me with the patient prior to the procedure   Current Facility-Administered Medications:    0.9 %  sodium chloride infusion, , Intravenous, Continuous, Jaynie Collins, DO, Last Rate: 20 mL/hr at 09/05/22 1034, New Bag at 09/05/22 1034  sodium chloride 20 mL/hr at 09/05/22 1034       No Known Allergies Allergies were reviewed  by me prior to the procedure  Objective   Body mass index is 29.02 kg/m. Vitals:   09/05/22 1025  BP: 115/89  Pulse: 73  Resp: 16  Temp: (!) 96.8 F (36 C)  TempSrc: Temporal  SpO2: 97%  Weight: 81.6 kg  Height: 5\' 6"  (1.676 m)     Physical Exam Vitals and nursing note reviewed.  Constitutional:      General: He is not in acute distress.    Appearance: Normal appearance. He is not ill-appearing, toxic-appearing or diaphoretic.  HENT:     Head: Normocephalic and atraumatic.     Nose: Nose normal.      Mouth/Throat:     Mouth: Mucous membranes are moist.     Pharynx: Oropharynx is clear.  Eyes:     General: No scleral icterus.    Extraocular Movements: Extraocular movements intact.  Cardiovascular:     Rate and Rhythm: Normal rate and regular rhythm.     Heart sounds: Normal heart sounds. No murmur heard.    No friction rub. No gallop.  Pulmonary:     Effort: Pulmonary effort is normal. No respiratory distress.     Breath sounds: Normal breath sounds. No wheezing, rhonchi or rales.  Abdominal:     General: Bowel sounds are normal. There is no distension.     Palpations: Abdomen is soft.     Tenderness: There is no abdominal tenderness. There is no guarding or rebound.  Musculoskeletal:     Cervical back: Neck supple.     Right lower leg: No edema.     Left lower leg: No edema.  Skin:    General: Skin is warm and dry.     Coloration: Skin is not jaundiced or pale.  Neurological:     General: No focal deficit present.     Mental Status: He is alert and oriented to person, place, and time. Mental status is at baseline.  Psychiatric:        Mood and Affect: Mood normal.        Behavior: Behavior normal.        Thought Content: Thought content normal.        Judgment: Judgment normal.      Assessment:  [name] is a 53 y.o. male  who presents today for Colonoscopy for colorectal cancer screening.  Plan:  Colonoscopy with possible intervention today  Colonoscopy with possible biopsy, control of bleeding, polypectomy, and interventions as necessary has been discussed with the patient/patient representative. Informed consent was obtained from the patient/patient representative after explaining the indication, nature, and risks of the procedure including but not limited to death, bleeding, perforation, missed neoplasm/lesions, cardiorespiratory compromise, and reaction to medications. Opportunity for questions was given and appropriate answers were provided. Patient/patient  representative has verbalized understanding is amenable to undergoing the procedure.   Jaynie Collins, DO  [facility]  Portions of the record may have been created with voice recognition software. Occasional wrong-word or 'sound-a-like' substitutions may have occurred due to the inherent limitations of voice recognition software.  Read the chart carefully and recognize, using context, where substitutions may have occurred.

## 2022-09-05 NOTE — Interval H&P Note (Signed)
History and Physical Interval Note: Preprocedure H&P from 09/05/22  was reviewed and there was no interval change after seeing and examining the patient.  Written consent was obtained from the patient after discussion of risks, benefits, and alternatives. Patient has consented to proceed with Colonoscopy with possible intervention   09/05/2022 10:57 AM  Christian [name]  has presented today for surgery, with the diagnosis of Colon cancer screening (Z12.11).  The various methods of treatment have been discussed with the patient and family. After consideration of risks, benefits and other options for treatment, the patient has consented to  Procedure(s): COLONOSCOPY (N/A) as a surgical intervention.  The patient's history has been reviewed, patient examined, no change in status, stable for surgery.  I have reviewed the patient's chart and labs.  Questions were answered to the patient's satisfaction.     Jaynie Collins

## 2022-09-05 NOTE — Transfer of Care (Signed)
Immediate Anesthesia Transfer of Care Note  Patient: Christian [name]  Procedure(s) Performed: COLONOSCOPY  Patient Location: Endoscopy Unit  Anesthesia Type:General  Level of Consciousness: drowsy and patient cooperative  Airway & Oxygen Therapy: Patient Spontanous Breathing and Patient connected to face mask oxygen  Post-op Assessment: Report given to RN and Post -op Vital signs reviewed and stable  Post vital signs: Reviewed and stable  Last Vitals:  Vitals Value Taken Time  BP 96/61 09/05/22 1129  Temp 35.7 C 09/05/22 1127  Pulse 68 09/05/22 1130  Resp 0 09/05/22 1130  SpO2 100 % 09/05/22 1130  Vitals shown include unvalidated device data.  Last Pain:  Vitals:   09/05/22 1127  TempSrc: Temporal  PainSc: Asleep         Complications: No notable events documented.

## 2022-09-05 NOTE — Op Note (Signed)
[name] Patient Name: Christian [name] Procedure Date: 09/05/2022 10:51 AM MRN: [id number] Account #: [id number] Date of Birth: May 18, 1970 Admit Type: Outpatient Age: 52 Room: [name] ENDO ROOM 1 Gender: Male Note Status: Finalized Instrument Name: Colonoscope [id number] Procedure:             Colonoscopy Indications:           Screening for colorectal malignant neoplasm Providers:             Trenda Moots, DO Referring MD:          Teena Irani. Terance Hart, MD (Referring MD) Medicines:             Monitored Anesthesia Care Complications:         No immediate complications. Estimated blood loss:                         Minimal. Procedure:             Pre-Anesthesia Assessment:                        - Prior to the procedure, a History and Physical was                         performed, and patient medications and allergies were                         reviewed. The patient is competent. The risks and                         benefits of the procedure and the sedation options and                         risks were discussed with the patient. All questions                         were answered and informed consent was obtained.                         Patient identification and proposed procedure were                         verified by the physician, the nurse, the anesthetist                         and the technician in the endoscopy suite. Mental                         Status Examination: alert and oriented. Airway                         Examination: normal oropharyngeal airway and neck                         mobility. Respiratory Examination: clear to                         auscultation. CV Examination: RRR, no murmurs, no S3  or S4. Prophylactic Antibiotics: The patient does not                         require prophylactic antibiotics. Prior                         Anticoagulants: The patient has taken Eliquis                          (apixaban), last dose was 3 days prior to procedure.                         ASA Grade Assessment: III - A patient with severe                         systemic disease. After reviewing the risks and                         benefits, the patient was deemed in satisfactory                         condition to undergo the procedure. The anesthesia                         plan was to use monitored anesthesia care (MAC).                         Immediately prior to administration of medications,                         the patient was re-assessed for adequacy to receive                         sedatives. The heart rate, respiratory rate, oxygen                         saturations, blood pressure, adequacy of pulmonary                         ventilation, and response to care were monitored                         throughout the procedure. The physical status of the                         patient was re-assessed after the procedure.                        After obtaining informed consent, the colonoscope was                         passed under direct vision. Throughout the procedure,                         the patient's blood pressure, pulse, and oxygen                         saturations were monitored continuously. The  Colonoscope was introduced through the anus and                         advanced to the the terminal ileum, with                         identification of the appendiceal orifice and IC                         valve. The colonoscopy was performed without                         difficulty. The patient tolerated the procedure well.                         The quality of the bowel preparation was evaluated                         using the BBPS [facility] Bowel Preparation Scale) with                         scores of: Right Colon = 2 (minor amount of residual                         staining, small fragments of stool and/or opaque                          liquid, but mucosa seen well), Transverse Colon = 1                         (portion of mucosa seen, but other areas not well seen                         due to staining, residual stool and/or opaque liquid)                         and Left Colon = 2 (minor amount of residual staining,                         small fragments of stool and/or opaque liquid, but                         mucosa seen well). The total BBPS score equals 5. The                         quality of the bowel preparation was inadequate. The                         terminal ileum, ileocecal valve, appendiceal orifice,                         and rectum were photographed. Findings:      The perianal and digital rectal examinations were normal. Pertinent       negatives include normal sphincter tone.      The terminal ileum appeared normal. Estimated blood loss: none.      Retroflexion in the right colon was  performed.      A large amount of adherent stool was found in the entire colon, making       visualization difficult. Lavage of the area was performed using a large       amount, resulting in incomplete clearance with fair visualization.       Estimated blood loss: none.      Normal mucosa was found in the entire colon. Biopsies for histology were       taken with a cold forceps from the right colon and left colon for       evaluation of microscopic colitis. Estimated blood loss was minimal.      Three sessile polyps were found in the descending colon, transverse       colon and ascending colon. The polyps were 1 to 2 mm in size. These       polyps were removed with a jumbo cold forceps. Resection and retrieval       were complete. Estimated blood loss: Minimal.      The exam was otherwise without abnormality on direct and retroflexion       views.      Stool adherent to walls was difficult to lavage and limited       visualization. Exam was without abnormality of what was able to be        visualized. Impression:            - Preparation of the colon was inadequate.                        - The examined portion of the ileum was normal.                        - Stool in the entire examined colon.                        - Normal mucosa in the entire examined colon. Biopsied.                        - Three 1 to 2 mm polyps in the descending colon, in                         the transverse colon and in the ascending colon,                         removed with a jumbo cold forceps. Resected and                         retrieved.                        - The examination was otherwise normal on direct and                         retroflexion views. Recommendation:        - Patient has a contact number available for                         emergencies. The signs and symptoms of potential  delayed complications were discussed with the patient.                         Return to normal activities tomorrow. Written                         discharge instructions were provided to the patient.                        - Discharge patient to home.                        - Resume previous diet.                        - Continue present medications.                        - Resume Eliquis (apixaban) at prior dose in 2 days.                         Refer to managing physician for further adjustment of                         therapy.                        - Await pathology results.                        - Repeat colonoscopy in 1 year because the bowel                         preparation was poor.                        - Return to GI office as previously scheduled.                        - The findings and recommendations were discussed with                         the patient.                        - The findings and recommendations were discussed with                         the designated responsible adult. Procedure Code(s):     --- Professional ---                         [id number], Colonoscopy, flexible; with biopsy, single or                         multiple Diagnosis Code(s):     --- Professional ---                        Z12.11, Encounter for screening for malignant neoplasm  of colon                        D12.4, Benign neoplasm of descending colon                        D12.3, Benign neoplasm of transverse colon (hepatic                         flexure or splenic flexure)                        D12.2, Benign neoplasm of ascending colon CPT copyright 2022 American Medical Association. All rights reserved. The codes documented in this report are preliminary and upon coder review may  be revised to meet current compliance requirements. Attending Participation:      I personally performed the entire procedure. Elfredia Nevins, DO Jaynie Collins DO, DO 09/05/2022 11:30:57 AM This report has been signed electronically. Number of Addenda: 0 Note Initiated On: 09/05/2022 10:51 AM Scope Withdrawal Time: 0 hours 17 minutes 16 seconds  Total Procedure Duration: 0 hours 20 minutes 12 seconds  Estimated Blood Loss:  Estimated blood loss was minimal.      [name]

## 2022-09-05 NOTE — Anesthesia Preprocedure Evaluation (Signed)
Anesthesia Evaluation  Patient identified by MRN, date of birth, ID band Patient awake    Reviewed: Allergy & Precautions, NPO status , Patient's Chart, lab work & pertinent test results  Airway Mallampati: III  TM Distance: <3 FB Neck ROM: full    Dental  (+) Teeth Intact   Pulmonary neg pulmonary ROS, COPD, former smoker   Pulmonary exam normal        Cardiovascular Exercise Tolerance: Good hypertension, + CAD, + Cardiac Stents and + DOE  negative cardio ROS Normal cardiovascular exam Rhythm:Regular Rate:Normal     Neuro/Psych    Depression    CVA negative neurological ROS  negative psych ROS   GI/Hepatic negative GI ROS, Neg liver ROS,,,  Endo/Other  negative endocrine ROSdiabetes, Well Controlled, Type 2, Oral Hypoglycemic Agents    Renal/GU negative Renal ROS  negative genitourinary   Musculoskeletal   Abdominal  (+) + obese  Peds negative pediatric ROS (+)  Hematology negative hematology ROS (+)   Anesthesia Other Findings Past Medical History: No date: CAD (coronary artery disease)     Comment:  a. 07/2013 NSTEMI/PCI: LCX 61m (4.0x23 Xience DES), RCA               39m, EF > 55%;  b. 07/2013 Echo: EF 60-65%, diast dysfxn,               mild LVH, mildly dil LA, nl RV size/fxn, nl RVSP. No date: Diabetes mellitus without complication (HCC) No date: History of tobacco abuse     Comment:  a. quit ~ 2010 No date: Hyperlipidemia No date: Morbid obesity (HCC)     Comment:  a. weighed 168 @ age 52. No date: Scoliosis No date: Stroke [facility])  Past Surgical History: No date: ADENOIDECTOMY 07/23/2013: CARDIAC CATHETERIZATION     Comment:  [facility]'x1 stent No date: CORONARY ANGIOPLASTY     Comment:  with stent  BMI    Body Mass Index: 29.02 kg/m      Reproductive/Obstetrics negative OB ROS                             Anesthesia Physical Anesthesia Plan  ASA: 3  Anesthesia Plan:  General   Post-op Pain Management:    Induction: Intravenous  PONV Risk Score and Plan: Propofol infusion and TIVA  Airway Management Planned: Natural Airway  Additional Equipment:   Intra-op Plan:   Post-operative Plan:   Informed Consent: I have reviewed the patients History and Physical, chart, labs and discussed the procedure including the risks, benefits and alternatives for the proposed anesthesia with the patient or authorized representative who has indicated his/her understanding and acceptance.     Dental Advisory Given  Plan Discussed with: CRNA and Surgeon  Anesthesia Plan Comments:        Anesthesia Quick Evaluation

## 2022-09-05 NOTE — Anesthesia Procedure Notes (Signed)
Procedure Name: General with mask airway Date/Time: 09/05/2022 11:00 AM  Performed by: Mohammed Kindle, CRNAPre-anesthesia Checklist: Patient identified, Emergency Drugs available, Suction available and Patient being monitored Patient Re-evaluated:Patient Re-evaluated prior to induction Oxygen Delivery Method: Simple face mask Induction Type: IV induction Placement Confirmation: positive ETCO2, CO2 detector and breath sounds checked- equal and bilateral Dental Injury: Teeth and Oropharynx as per pre-operative assessment

## 2022-09-05 NOTE — Anesthesia Postprocedure Evaluation (Signed)
Anesthesia Post Note  Patient: Christian [name]  Procedure(s) Performed: COLONOSCOPY  Patient location during evaluation: PACU Anesthesia Type: General Level of consciousness: awake and awake and alert Pain management: satisfactory to patient Vital Signs Assessment: post-procedure vital signs reviewed and stable Respiratory status: spontaneous breathing and nonlabored ventilation Cardiovascular status: stable Anesthetic complications: no   No notable events documented.   Last Vitals:  Vitals:   09/05/22 1025 09/05/22 1127  BP: 115/89 96/61  Pulse: 73 73  Resp: 16 12  Temp: (!) 36 C (!) 35.7 C  SpO2: 97% 99%    Last Pain:  Vitals:   09/05/22 1127  TempSrc: Temporal  PainSc: Asleep                 VAN STAVEREN,Michele Kerlin

## 2022-09-06 ENCOUNTER — Encounter: Payer: Self-pay | Admitting: Gastroenterology

## 2022-11-29 ENCOUNTER — Other Ambulatory Visit: Payer: Self-pay | Admitting: Cardiovascular Disease

## 2022-12-02 NOTE — Telephone Encounter (Signed)
Pt is scheduled on 10/28

## 2022-12-13 ENCOUNTER — Other Ambulatory Visit: Payer: Self-pay | Admitting: Cardiovascular Disease

## 2022-12-25 ENCOUNTER — Other Ambulatory Visit: Payer: Self-pay | Admitting: Cardiovascular Disease
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
## 2023-01-20 ENCOUNTER — Encounter: Payer: Self-pay | Admitting: Cardiovascular Disease

## 2023-01-20 ENCOUNTER — Ambulatory Visit: Payer: Medicare HMO | Attending: Cardiovascular Disease | Admitting: Cardiovascular Disease

## 2023-01-20 VITALS — BP 120/80 | HR 73 | Ht 66.0 in | Wt 177.2 lb

## 2023-01-20 DIAGNOSIS — I10 Essential (primary) hypertension: Secondary | ICD-10-CM

## 2023-01-20 DIAGNOSIS — I25118 Atherosclerotic heart disease of native coronary artery with other forms of angina pectoris: Secondary | ICD-10-CM | POA: Diagnosis not present

## 2023-01-20 DIAGNOSIS — I639 Cerebral infarction, unspecified: Secondary | ICD-10-CM | POA: Diagnosis not present

## 2023-01-20 DIAGNOSIS — Z79899 Other long term (current) drug therapy: Secondary | ICD-10-CM | POA: Diagnosis not present

## 2023-01-20 DIAGNOSIS — E782 Mixed hyperlipidemia: Secondary | ICD-10-CM

## 2023-01-20 NOTE — Patient Instructions (Addendum)
Medication Instructions:  No changes  If you need a refill on your cardiac medications before your next appointment, please call your pharmacy.   Lab work Lipid panel , CBC today  Testing/Procedures: No new testing needed  Follow-Up: At [facility], you and your health needs are our priority.  As part of our continuing mission to provide you with exceptional heart care, we have created designated Provider Care Teams.  These Care Teams include your primary Cardiologist (physician) and Advanced Practice Providers (APPs -  Physician Assistants and Nurse Practitioners) who all work together to provide you with the care you need, when you need it.  You will need a follow up appointment in 12 months  Providers on your designated Care Team:   Nicolasa Ducking, NP Eula Listen, PA-C Cadence Fransico Michael, [place]  COVID-19 Vaccine Information can be found at: [email or web address] For questions related to vaccine distribution or appointments, please email vaccine@ .com or call [phone].

## 2023-01-21 ENCOUNTER — Other Ambulatory Visit: Payer: Self-pay | Admitting: Emergency Medicine

## 2023-01-21 LAB — LIPID PANEL
Chol/HDL Ratio: 3.4 ratio (ref 0.0–5.0)
Cholesterol, Total: 181 mg/dL (ref 100–199)
HDL: 54 mg/dL (ref >39)
LDL Chol Calc (NIH): 104 mg/dL — ABNORMAL HIGH (ref 0–99)
Triglycerides: 130 mg/dL (ref 0–149)
VLDL Cholesterol Cal: 23 mg/dL (ref 5–40)

## 2023-01-21 LAB — CBC
Hematocrit: 56.8 % — ABNORMAL HIGH (ref 37.5–51.0)
Hemoglobin: 19.1 g/dL — ABNORMAL HIGH (ref 13.0–17.7)
MCH: 35.3 pg — ABNORMAL HIGH (ref 26.6–33.0)
MCHC: 33.6 g/dL (ref 31.5–35.7)
MCV: 105 fL — ABNORMAL HIGH (ref 79–97)
Platelets: 197 10*3/uL (ref 150–450)
RBC: 5.41 x10E6/uL (ref 4.14–5.80)
RDW: 14.4 % (ref 11.6–15.4)
WBC: 8.6 10*3/uL (ref 3.4–10.8)

## 2023-01-21 MED ORDER — ATORVASTATIN CALCIUM 80 MG PO TABS: 80.0000 mg | ORAL_TABLET | Freq: Every day | ORAL | 3 refills | Status: AC

## 2023-02-03 DIAGNOSIS — E1140 Type 2 diabetes mellitus with diabetic neuropathy, unspecified: Secondary | ICD-10-CM | POA: Diagnosis not present

## 2023-02-03 DIAGNOSIS — Z0000 Encounter for general adult medical examination without abnormal findings: Secondary | ICD-10-CM | POA: Diagnosis not present

## 2023-03-09 ENCOUNTER — Other Ambulatory Visit: Payer: Self-pay | Admitting: Cardiovascular Disease

## 2023-03-29 ENCOUNTER — Other Ambulatory Visit: Payer: Self-pay | Admitting: Cardiovascular Disease

## 2023-03-31 NOTE — Telephone Encounter (Signed)
 Refill request received but med not on current med list due to reporting at 01/20/23 visit that he is not taking Lisinopril.  Confirmed with patient that he is taking Lisinopril.  This was not discuss at 01/20/23 MD visit.  OK to refill?

## 2023-07-04 ENCOUNTER — Other Ambulatory Visit: Payer: Self-pay | Admitting: Cardiovascular Disease

## 2023-07-04 DIAGNOSIS — I639 Cerebral infarction, unspecified: Secondary | ICD-10-CM

## 2023-07-04 NOTE — Telephone Encounter (Signed)
 Prescription refill request for Eliquis received. Indication:cva Last office visit:10/24 UJW:[id number] labs Age: 53 Weight:80.4  kg  Prescription refilled

## 2023-07-08 ENCOUNTER — Telehealth: Payer: Self-pay

## 2023-07-08 DIAGNOSIS — D751 Secondary polycythemia: Secondary | ICD-10-CM

## 2023-07-08 LAB — BASIC METABOLIC PANEL WITH GFR
BUN/Creatinine Ratio: 8 — ABNORMAL LOW (ref 9–20)
BUN: 7 mg/dL (ref 6–24)
CO2: 23 mmol/L (ref 20–29)
Calcium: 10 mg/dL (ref 8.7–10.2)
Chloride: 99 mmol/L (ref 96–106)
Creatinine, Ser: 0.84 mg/dL (ref 0.76–1.27)
Glucose: 196 mg/dL — ABNORMAL HIGH (ref 70–99)
Potassium: 5.1 mmol/L (ref 3.5–5.2)
Sodium: 143 mmol/L (ref 134–144)
eGFR: 104 mL/min/{1.73_m2} (ref >59)

## 2023-07-08 LAB — CBC WITH DIFFERENTIAL/PLATELET
Basophils Absolute: 0.1 10*3/uL (ref 0.0–0.2)
Basos: 2 %
EOS (ABSOLUTE): 0.2 10*3/uL (ref 0.0–0.4)
Eos: 3 %
Hematocrit: 59.3 % — ABNORMAL HIGH (ref 37.5–51.0)
Hemoglobin: 20.5 g/dL (ref 13.0–17.7)
Immature Grans (Abs): 0.1 10*3/uL (ref 0.0–0.1)
Immature Granulocytes: 1 %
Lymphocytes Absolute: 2.6 10*3/uL (ref 0.7–3.1)
Lymphs: 36 %
MCH: 36.6 pg — ABNORMAL HIGH (ref 26.6–33.0)
MCHC: 34.6 g/dL (ref 31.5–35.7)
MCV: 106 fL — ABNORMAL HIGH (ref 79–97)
Monocytes Absolute: 0.5 10*3/uL (ref 0.1–0.9)
Monocytes: 7 %
Neutrophils Absolute: 3.8 10*3/uL (ref 1.4–7.0)
Neutrophils: 51 %
Platelets: 305 10*3/uL (ref 150–450)
RBC: 5.6 x10E6/uL (ref 4.14–5.80)
RDW: 14.2 % (ref 11.6–15.4)
WBC: 7.3 10*3/uL (ref 3.4–10.8)

## 2023-07-08 NOTE — Telephone Encounter (Signed)
 Received call from [facility]- advising of critical labs.   Hemoglobin 20.5- results in epic.   Will send to ordering provider for FYI.

## 2023-07-11 NOTE — Telephone Encounter (Signed)
 Called and spoke with patient. Notified patient of th following from Dr. Gollan.  Hemoglobin very high, above normal levels Can sometimes be associated with blood clots, heart attack, stroke Would recommend referral to hematology for further discussion, possible treatment Thx TGollan   Patient verbalized understanding. Order placed for Hematology referral.

## 2023-07-11 NOTE — Addendum Note (Signed)
 Addended by: Elvia Hammans on: 07/11/2023 10:18 AM   Modules accepted: Orders

## 2023-07-15 ENCOUNTER — Inpatient Hospital Stay: Attending: Oncology | Admitting: Oncology

## 2023-07-15 ENCOUNTER — Encounter: Payer: Self-pay | Admitting: Oncology

## 2023-07-15 ENCOUNTER — Inpatient Hospital Stay

## 2023-07-15 ENCOUNTER — Other Ambulatory Visit: Payer: Self-pay

## 2023-07-15 VITALS — BP 178/112 | HR 120 | Temp 96.6°F | Resp 18 | Ht 66.0 in | Wt 167.5 lb

## 2023-07-15 VITALS — BP 158/117 | HR 112 | Resp 18

## 2023-07-15 DIAGNOSIS — D751 Secondary polycythemia: Secondary | ICD-10-CM | POA: Insufficient documentation

## 2023-07-15 LAB — CBC WITH DIFFERENTIAL/PLATELET
Abs Immature Granulocytes: 0.07 10*3/uL (ref 0.00–0.07)
Basophils Absolute: 0.1 10*3/uL (ref 0.0–0.1)
Basophils Relative: 1 %
Eosinophils Absolute: 0 10*3/uL (ref 0.0–0.5)
Eosinophils Relative: 0 %
HCT: 56.2 % — ABNORMAL HIGH (ref 39.0–52.0)
Hemoglobin: 19.7 g/dL — ABNORMAL HIGH (ref 13.0–17.0)
Immature Granulocytes: 1 %
Lymphocytes Relative: 13 %
Lymphs Abs: 1.3 10*3/uL (ref 0.7–4.0)
MCH: 35.6 pg — ABNORMAL HIGH (ref 26.0–34.0)
MCHC: 35.1 g/dL (ref 30.0–36.0)
MCV: 101.4 fL — ABNORMAL HIGH (ref 80.0–100.0)
Monocytes Absolute: 0.6 10*3/uL (ref 0.1–1.0)
Monocytes Relative: 6 %
Neutro Abs: 7.9 10*3/uL — ABNORMAL HIGH (ref 1.7–7.7)
Neutrophils Relative %: 79 %
Platelets: 186 10*3/uL (ref 150–400)
RBC: 5.54 MIL/uL (ref 4.22–5.81)
RDW: 13.8 % (ref 11.5–15.5)
WBC: 10 10*3/uL (ref 4.0–10.5)
nRBC: 0 % (ref 0.0–0.2)

## 2023-07-15 NOTE — Patient Instructions (Signed)
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
## 2023-07-16 LAB — ERYTHROPOIETIN: Erythropoietin: 2.8 m[IU]/mL (ref 2.6–18.5)

## 2023-07-16 LAB — TESTOSTERONE: Testosterone: 105 ng/dL — ABNORMAL LOW (ref 264–916)

## 2023-07-20 ENCOUNTER — Encounter: Payer: Self-pay | Admitting: Oncology

## 2023-07-21 LAB — JAK2 V617F RFX CALR/MPL/E12-15

## 2023-07-21 LAB — CALR +MPL + E12-E15  (REFLEX)

## 2023-07-22 ENCOUNTER — Other Ambulatory Visit: Payer: Self-pay | Admitting: Cardiovascular Disease

## 2023-07-22 ENCOUNTER — Telehealth: Payer: Self-pay | Admitting: Emergency Medicine

## 2023-07-22 ENCOUNTER — Inpatient Hospital Stay

## 2023-07-22 DIAGNOSIS — D751 Secondary polycythemia: Secondary | ICD-10-CM

## 2023-07-22 LAB — HEMOGLOBIN AND HEMATOCRIT (CANCER CENTER ONLY)
HCT: 53.2 % — ABNORMAL HIGH (ref 39.0–52.0)
Hemoglobin: 18.8 g/dL — ABNORMAL HIGH (ref 13.0–17.0)

## 2023-07-22 NOTE — Telephone Encounter (Signed)
 At last office visit patient reported that he was not taking Lisinopril . Called and spoke with patient today and patient reports that he is currently taking Lisinopril . Patient reports recent systolic blood pressure in the 140's. Would you like me to send in a refill for Lisinopril ?      Name from pharmacy: LISINOPRIL  20 MG TABLET        Will file in chart as: lisinopril  (ZESTRIL ) 20 MG tablet   The original prescription was discontinued on 01/20/2023 by Crusan, Sharon G, CMA for the following reason: Patient Preference. Renewing this prescription may not be appropriate.   Sig: TAKE 1 TABLET BY MOUTH EVERY DAY   Disp: 90 tablet    Refills: 0 (Pharmacy requested: Not specified)   Start: 07/22/2023   Class: Normal   Last ordered: 6 months ago (12/25/2022) by Devorah Fonder, MD   Last refill: 12/30/2022   Rx #: [id number]     To be filled at: [id number] [place], [place] - [address]       07/22/2023 - Rx Request: Interface, Surescripts Out and others (Newest Message First)View All Conversations on this Encounter Ronette Coffer, CMA to Me     07/22/23  3:17 PM Please review if this patient should be taking the Lisinopril  20 mg. The patient stated he was not taken on the last office visit 01/20/2023 with Dr. Gollan. It is unclear if he should be taking it or not. There is a refill pending from the pharmacy for Lisinopril   Thanks, Genevia Kern

## 2023-07-22 NOTE — Progress Notes (Signed)
 Pt not receiving phlebotomy today Hct 53.2.

## 2023-07-28 MED ORDER — LISINOPRIL 20 MG PO TABS: 20.0000 mg | ORAL_TABLET | Freq: Every day | ORAL | 3 refills | Status: AC

## 2023-07-28 NOTE — Telephone Encounter (Signed)
 Called patient and left message for call back.

## 2023-07-28 NOTE — Addendum Note (Signed)
 Addended by: Elvia Hammans on: 07/28/2023 08:54 AM   Modules accepted: Orders

## 2023-07-29 NOTE — Telephone Encounter (Signed)
 See MyChart encounter

## 2023-08-03 ENCOUNTER — Other Ambulatory Visit: Payer: Self-pay | Admitting: Cardiovascular Disease

## 2023-08-04 ENCOUNTER — Inpatient Hospital Stay: Admitting: Oncology

## 2023-08-04 NOTE — Telephone Encounter (Signed)
 Prescription refill request for Eliquis  received. Indication:cva Last office visit:10/24 Scr:0.84  4/25 Age: 53 Weight:76  kg  Prescription refilled

## 2023-08-12 ENCOUNTER — Inpatient Hospital Stay: Attending: Oncology | Admitting: Oncology

## 2023-08-12 ENCOUNTER — Telehealth: Payer: Self-pay

## 2023-08-12 ENCOUNTER — Telehealth: Payer: Self-pay | Admitting: *Deleted

## 2023-08-12 ENCOUNTER — Encounter: Payer: Self-pay | Admitting: Oncology

## 2023-08-12 VITALS — BP 158/101 | HR 110 | Temp 98.6°F | Resp 20 | Wt 159.9 lb

## 2023-08-12 DIAGNOSIS — D751 Secondary polycythemia: Secondary | ICD-10-CM | POA: Insufficient documentation

## 2023-08-12 DIAGNOSIS — E1140 Type 2 diabetes mellitus with diabetic neuropathy, unspecified: Secondary | ICD-10-CM | POA: Diagnosis not present

## 2023-08-12 DIAGNOSIS — Z87891 Personal history of nicotine dependence: Secondary | ICD-10-CM | POA: Diagnosis not present

## 2023-08-12 DIAGNOSIS — I824Z1 Acute embolism and thrombosis of unspecified deep veins of right distal lower extremity: Secondary | ICD-10-CM | POA: Diagnosis not present

## 2023-08-12 DIAGNOSIS — Z125 Encounter for screening for malignant neoplasm of prostate: Secondary | ICD-10-CM | POA: Diagnosis not present

## 2023-08-12 DIAGNOSIS — I10 Essential (primary) hypertension: Secondary | ICD-10-CM | POA: Diagnosis not present

## 2023-08-12 DIAGNOSIS — R1110 Vomiting, unspecified: Secondary | ICD-10-CM | POA: Diagnosis not present

## 2023-08-12 DIAGNOSIS — E291 Testicular hypofunction: Secondary | ICD-10-CM | POA: Diagnosis not present

## 2023-08-12 DIAGNOSIS — I69351 Hemiplegia and hemiparesis following cerebral infarction affecting right dominant side: Secondary | ICD-10-CM | POA: Diagnosis not present
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
## 2023-08-12 NOTE — Telephone Encounter (Signed)
 Patient called saying that someone called him early today but he could not get to the phone.  Like a call back.  I told Terrea Ferrier that he called back and she will get back in touch with him when she finishes the morning patient's
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
## 2023-08-12 NOTE — Progress Notes (Signed)
 Patient will need a bone marrow biopsy done; also must contact patho to add myeloid mutation panel.  Per Jerona Mooring "I can put him on Mon 6/2 at 8:30a an arrive at 7:30am".  Outbound call to patient [phone]; voicemail box is not set up yet, unable to leave a message.

## 2023-08-12 NOTE — Telephone Encounter (Signed)
 Patient will need a bone marrow biopsy done; also must contact patho to add myeloid mutation panel.  Per Christian [name] "I can put him on Mon 6/2 at 8:30a an arrive at 7:30am".  Outbound call to patient [phone]; voicemail box is not set up yet, unable to leave a message.

## 2023-08-12 NOTE — Progress Notes (Signed)
 Hematology/Oncology Consult note [facility]  Telephone:([phone] Fax:([phone]  Patient Care Team: Rory Collard, MD as PCP - General (Family Medicine) Devorah Fonder, MD as PCP - Cardiology (Cardiology) Avonne Boettcher, MD as Consulting Physician (Hematology and Oncology)   Name of the patient: Christian [name]  [id number]  [birth year]   Date of visit: 08/12/23  Diagnosis-polycythemia of unclear etiology  Chief complaint/ Reason for visit-discuss results of blood work  Heme/Onc history:  Patient is a 53 year old male with a past medical history significant for hypertension type 2 diabetes, prior history of stroke in 2021.  He had coronary artery disease and underwent stent placement in 2015.  Prior history of DVT in his right lower extremity.  He is on Eliquis  for the same 2.5 mg twice daily.  He has been referred for polycythemia.  CBC on 07/07/2023 showed an H&H of 20.5/59.3 with an MCV of 106.  White count was 7.3 and a platelet count of 305.   Results of blood work from 07/15/2023 showed an H&H of 19.7/56.2 with an MCV of 101.4.  Patient underwent 1 session of phlebotomy for the same.  Testosterone  levels were low at 105.  EPO levels were low normal at 2.8.  JAK2, CALR, MPL and exon 12 mutation negative  Interval history-no acute issues since last visit.  He has baseline fatigue and exertional shortness of breath.  ECOG PS- 2 Pain scale- 0   Review of systems- Review of Systems  Constitutional:  Positive for malaise/fatigue. Negative for chills, fever and weight loss.  HENT:  Negative for congestion, ear discharge and nosebleeds.   Eyes:  Negative for blurred vision.  Respiratory:  Positive for shortness of breath. Negative for cough, hemoptysis, sputum production and wheezing.   Cardiovascular:  Negative for chest pain, palpitations, orthopnea and claudication.  Gastrointestinal:  Negative for abdominal pain, blood in stool, constipation,  diarrhea, heartburn, melena, nausea and vomiting.  Genitourinary:  Negative for dysuria, flank pain, frequency, hematuria and urgency.  Musculoskeletal:  Negative for back pain, joint pain and myalgias.  Skin:  Negative for rash.  Neurological:  Negative for dizziness, tingling, focal weakness, seizures, weakness and headaches.  Endo/Heme/Allergies:  Does not bruise/bleed easily.  Psychiatric/Behavioral:  Negative for depression and suicidal ideas. The patient does not have insomnia.       No Known Allergies   Past Medical History:  Diagnosis Date   CAD (coronary artery disease)    a. 07/2013 NSTEMI/PCI: LCX 77m (4.0x23 Xience DES), RCA 33m, EF > 55%;  b. 07/2013 Echo: EF 60-65%, diast dysfxn, mild LVH, mildly dil LA, nl RV size/fxn, nl RVSP.   Diabetes mellitus without complication (HCC)    History of tobacco abuse    a. quit ~ 2010   Hyperlipidemia    Morbid obesity (HCC)    a. weighed 168 @ age 19.   Scoliosis    Stroke [facility])      Past Surgical History:  Procedure Laterality Date   ADENOIDECTOMY     CARDIAC CATHETERIZATION  07/23/2013   [facility]'x1 stent   COLONOSCOPY N/A 09/05/2022   Procedure: COLONOSCOPY;  Surgeon: Quintin Buckle, DO;  Location: [facility] ENDOSCOPY;  Service: Gastroenterology;  Laterality: N/A;   CORONARY ANGIOPLASTY     with stent    Social History   Socioeconomic History   Marital status: Single    Spouse name: Not on file   Number of children: 2   Years of education: Not on file  Highest education level: Not on file  Occupational History    Comment: Disability  Tobacco Use   Smoking status: Former    Current packs/day: 0.00    Types: Cigarettes    Quit date: 2010    Years since quitting: 15.3   Smokeless tobacco: Never  Vaping Use   Vaping status: Never Used  Substance and Sexual Activity   Alcohol use: Not Currently    Comment: 3 drinks a day   Drug use: Not Currently    Types: Marijuana    Comment: past   Sexual activity: Not  Currently  Other Topics Concern   Not on file  Social History Narrative   Not on file   Social Drivers of Health   Financial Resource Strain: Patient Declined (02/02/2023)   Received from [facility]   Overall Financial Resource Strain (CARDIA)    Difficulty of Paying Living Expenses: Patient declined  Food Insecurity: No Food Insecurity (07/15/2023)   Hunger Vital Sign    Worried About Running Out of Food in the Last Year: Never true    Ran Out of Food in the Last Year: Never true  Transportation Needs: No Transportation Needs (07/15/2023)   PRAPARE - Administrator, Civil Service (Medical): No    Lack of Transportation (Non-Medical): No  Physical Activity: Not on file  Stress: Not on file  Social Connections: Not on file  Intimate Partner Violence: Not At Risk (07/15/2023)   Humiliation, Afraid, Rape, and Kick questionnaire    Fear of Current or Ex-Partner: No    Emotionally Abused: No    Physically Abused: No    Sexually Abused: No    Family History  Problem Relation Age of Onset   Heart disease Mother    Esophageal cancer Father    Diabetes Sister      Current Outpatient Medications:    apixaban  (ELIQUIS ) 2.5 MG TABS tablet, TAKE 1 TABLET BY MOUTH 2 TIMES DAILY., Disp: 60 tablet, Rfl: 5   atorvastatin  (LIPITOR ) 80 MG tablet, Take 1 tablet (80 mg total) by mouth daily., Disp: 90 tablet, Rfl: 3   carvedilol  (COREG ) 25 MG tablet, TAKE 1 TABLET (25 MG TOTAL) BY MOUTH TWICE A DAY WITH MEALS, Disp: 180 tablet, Rfl: 3   glimepiride  (AMARYL ) 1 MG tablet, Take 1 mg by mouth 2 (two) times daily., Disp: , Rfl:    lisinopril  (ZESTRIL ) 20 MG tablet, Take 1 tablet (20 mg total) by mouth daily., Disp: 90 tablet, Rfl: 3   mirtazapine  (REMERON ) 30 MG tablet, Take 1 tablet (30 mg total) by mouth at bedtime. (Patient not taking: Reported on 08/12/2023), Disp: 30 tablet, Rfl: 3  Physical exam:  Vitals:   08/12/23 0843  BP: (!) 158/101  Pulse: (!) 110   Resp: 20  Temp: 98.6 F (37 C)  SpO2: 100%  Weight: 159 lb 14.4 oz (72.5 kg)   Physical Exam Cardiovascular:     Rate and Rhythm: Normal rate and regular rhythm.     Heart sounds: Normal heart sounds.  Pulmonary:     Effort: Pulmonary effort is normal.     Breath sounds: Normal breath sounds.  Skin:    General: Skin is warm and dry.  Neurological:     Mental Status: He is alert and oriented to person, place, and time.      I have personally reviewed labs listed below:    Latest Ref Rng & Units 07/07/2023    3:02 PM  CMP  Glucose 70 - 99 mg/dL 161   BUN 6 - 24 mg/dL 7   Creatinine 0.96 - 0.45 mg/dL 4.09   Sodium 811 - 914 mmol/L 143   Potassium 3.5 - 5.2 mmol/L 5.1   Chloride 96 - 106 mmol/L 99   CO2 20 - 29 mmol/L 23   Calcium  8.7 - 10.2 mg/dL 78.2       Latest Ref Rng & Units 07/22/2023    1:07 PM  CBC  Hemoglobin 13.0 - 17.0 g/dL 95.6   Hematocrit 21.3 - 52.0 % 53.2      Assessment and plan- Patient is a 54 y.o. male referred for polycythemia  JAK2, CALR, and MPL as well as exon 12 mutation negative.  EPO levels however are low normal at 2.8 and therefore a primary myeloproliferative process can still be not ruled out.  I am therefore proceeding with a bone marrow biopsy at this time.  I will also be doing NGS myeloid mutation panel and bone marrow biopsy.  I will see him back tentatively in 4 weeks time to discuss the results of bone marrow biopsy and for possible phlebotomy.  If bone marrow biopsy does not reveal evidence of myeloproliferative disorder I would recommend getting sleep apnea testing to rule out obstructive sleep apnea as a cause of secondary polycythemia although 1 would expect higher EPO levels in that situation.   Visit Diagnosis 1. Polycythemia      Dr. Seretha Dance, MD, MPH [facility] [phone] 08/12/2023 10:27 AM

## 2023-08-25 ENCOUNTER — Other Ambulatory Visit: Payer: Self-pay | Admitting: Radiology

## 2023-08-25 DIAGNOSIS — D751 Secondary polycythemia: Secondary | ICD-10-CM
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
## 2023-08-26 ENCOUNTER — Emergency Department
Admission: EM | Admit: 2023-08-26 | Discharge: 2023-08-26 | Disposition: A | Discharge status: Home / Self Care | Attending: Emergency Medicine | Admitting: Emergency Medicine

## 2023-08-26 ENCOUNTER — Encounter: Payer: Self-pay | Admitting: Radiology

## 2023-08-26 ENCOUNTER — Other Ambulatory Visit: Payer: Self-pay

## 2023-08-26 ENCOUNTER — Emergency Department

## 2023-08-26 ENCOUNTER — Telehealth: Payer: Self-pay

## 2023-08-26 ENCOUNTER — Ambulatory Visit
Admission: RE | Admit: 2023-08-26 | Discharge: 2023-08-26 | Disposition: A | Discharge status: Home / Self Care | Source: Ambulatory Visit | Attending: Oncology | Admitting: Oncology

## 2023-08-26 DIAGNOSIS — I517 Cardiomegaly: Secondary | ICD-10-CM | POA: Diagnosis not present

## 2023-08-26 DIAGNOSIS — R197 Diarrhea, unspecified: Secondary | ICD-10-CM | POA: Insufficient documentation

## 2023-08-26 DIAGNOSIS — I10 Essential (primary) hypertension: Secondary | ICD-10-CM | POA: Insufficient documentation

## 2023-08-26 DIAGNOSIS — Z8673 Personal history of transient ischemic attack (TIA), and cerebral infarction without residual deficits: Secondary | ICD-10-CM | POA: Insufficient documentation

## 2023-08-26 DIAGNOSIS — D751 Secondary polycythemia: Secondary | ICD-10-CM | POA: Diagnosis present

## 2023-08-26 DIAGNOSIS — Z7901 Long term (current) use of anticoagulants: Secondary | ICD-10-CM | POA: Insufficient documentation

## 2023-08-26 DIAGNOSIS — R531 Weakness: Secondary | ICD-10-CM | POA: Diagnosis not present

## 2023-08-26 DIAGNOSIS — Z87891 Personal history of nicotine dependence: Secondary | ICD-10-CM | POA: Diagnosis not present

## 2023-08-26 DIAGNOSIS — I2510 Atherosclerotic heart disease of native coronary artery without angina pectoris: Secondary | ICD-10-CM | POA: Diagnosis not present

## 2023-08-26 DIAGNOSIS — Z7984 Long term (current) use of oral hypoglycemic drugs: Secondary | ICD-10-CM | POA: Diagnosis not present

## 2023-08-26 DIAGNOSIS — Z79899 Other long term (current) drug therapy: Secondary | ICD-10-CM | POA: Diagnosis not present

## 2023-08-26 DIAGNOSIS — I959 Hypotension, unspecified: Secondary | ICD-10-CM | POA: Insufficient documentation

## 2023-08-26 DIAGNOSIS — E119 Type 2 diabetes mellitus without complications: Secondary | ICD-10-CM | POA: Insufficient documentation

## 2023-08-26 DIAGNOSIS — E1142 Type 2 diabetes mellitus with diabetic polyneuropathy: Secondary | ICD-10-CM | POA: Diagnosis not present

## 2023-08-26 DIAGNOSIS — T887XXA Unspecified adverse effect of drug or medicament, initial encounter: Secondary | ICD-10-CM

## 2023-08-26 LAB — CBC WITH DIFFERENTIAL/PLATELET
Abs Immature Granulocytes: 0.06 10*3/uL (ref 0.00–0.07)
Abs Immature Granulocytes: 0.07 10*3/uL (ref 0.00–0.07)
Basophils Absolute: 0.1 10*3/uL (ref 0.0–0.1)
Basophils Absolute: 0.1 10*3/uL (ref 0.0–0.1)
Basophils Relative: 1 %
Basophils Relative: 1 %
Eosinophils Absolute: 0.3 10*3/uL (ref 0.0–0.5)
Eosinophils Absolute: 0.2 10*3/uL (ref 0.0–0.5)
Eosinophils Relative: 3 %
Eosinophils Relative: 3 %
HCT: 37.8 % — ABNORMAL LOW (ref 39.0–52.0)
HCT: 36.1 % — ABNORMAL LOW (ref 39.0–52.0)
Hemoglobin: 13.3 g/dL (ref 13.0–17.0)
Hemoglobin: 12.5 g/dL — ABNORMAL LOW (ref 13.0–17.0)
Immature Granulocytes: 1 %
Immature Granulocytes: 1 %
Lymphocytes Relative: 37 %
Lymphocytes Relative: 30 %
Lymphs Abs: 2.9 10*3/uL (ref 0.7–4.0)
Lymphs Abs: 2.1 10*3/uL (ref 0.7–4.0)
MCH: 34.4 pg — ABNORMAL HIGH (ref 26.0–34.0)
MCH: 34.2 pg — ABNORMAL HIGH (ref 26.0–34.0)
MCHC: 35.2 g/dL (ref 30.0–36.0)
MCHC: 34.6 g/dL (ref 30.0–36.0)
MCV: 97.7 fL (ref 80.0–100.0)
MCV: 98.6 fL (ref 80.0–100.0)
Monocytes Absolute: 0.8 10*3/uL (ref 0.1–1.0)
Monocytes Absolute: 0.7 10*3/uL (ref 0.1–1.0)
Monocytes Relative: 11 %
Monocytes Relative: 10 %
Neutro Abs: 3.7 10*3/uL (ref 1.7–7.7)
Neutro Abs: 3.9 10*3/uL (ref 1.7–7.7)
Neutrophils Relative %: 47 %
Neutrophils Relative %: 55 %
Platelets: 266 10*3/uL (ref 150–400)
Platelets: 239 10*3/uL (ref 150–400)
RBC: 3.87 MIL/uL — ABNORMAL LOW (ref 4.22–5.81)
RBC: 3.66 MIL/uL — ABNORMAL LOW (ref 4.22–5.81)
RDW: 12.7 % (ref 11.5–15.5)
RDW: 12.4 % (ref 11.5–15.5)
WBC: 7.8 10*3/uL (ref 4.0–10.5)
WBC: 7 10*3/uL (ref 4.0–10.5)
nRBC: 0 % (ref 0.0–0.2)
nRBC: 0 % (ref 0.0–0.2)

## 2023-08-26 LAB — COMPREHENSIVE METABOLIC PANEL WITH GFR
ALT: 14 U/L (ref 0–44)
AST: 19 U/L (ref 15–41)
Albumin: 3.2 g/dL — ABNORMAL LOW (ref 3.5–5.0)
Alkaline Phosphatase: 70 U/L (ref 38–126)
Anion gap: 13 (ref 5–15)
BUN: 11 mg/dL (ref 6–20)
CO2: 27 mmol/L (ref 22–32)
Calcium: 8.8 mg/dL — ABNORMAL LOW (ref 8.9–10.3)
Chloride: 92 mmol/L — ABNORMAL LOW (ref 98–111)
Creatinine, Ser: 0.83 mg/dL (ref 0.61–1.24)
GFR, Estimated: >60 mL/min (ref >60)
Glucose, Bld: 319 mg/dL — ABNORMAL HIGH (ref 70–99)
Potassium: 3.8 mmol/L (ref 3.5–5.1)
Sodium: 132 mmol/L — ABNORMAL LOW (ref 135–145)
Total Bilirubin: 2 mg/dL — ABNORMAL HIGH (ref 0.0–1.2)
Total Protein: 5.7 g/dL — ABNORMAL LOW (ref 6.5–8.1)

## 2023-08-26 LAB — GLUCOSE, CAPILLARY: Glucose-Capillary: 327 mg/dL — ABNORMAL HIGH (ref 70–99)

## 2023-08-26 LAB — BRAIN NATRIURETIC PEPTIDE: B Natriuretic Peptide: 71.5 pg/mL (ref 0.0–100.0)

## 2023-08-26 LAB — LACTIC ACID, PLASMA: Lactic Acid, Venous: 1.7 mmol/L (ref 0.5–1.9)

## 2023-08-26 LAB — TROPONIN I (HIGH SENSITIVITY)
Troponin I (High Sensitivity): 21 ng/L — ABNORMAL HIGH (ref <18)
Troponin I (High Sensitivity): 20 ng/L — ABNORMAL HIGH (ref <18)

## 2023-08-26 MED ORDER — LACTATED RINGERS IV BOLUS
1000.0000 mL | Freq: Once | INTRAVENOUS | Status: AC
  Administered 2023-08-26: 1000 mL via INTRAVENOUS

## 2023-08-26 MED ORDER — SODIUM CHLORIDE 0.9 % IV BOLUS
250.0000 mL | Freq: Once | INTRAVENOUS | Status: AC
  Administered 2023-08-26: 250 mL via INTRAVENOUS

## 2023-08-26 MED ORDER — HEPARIN SOD (PORK) LOCK FLUSH 100 UNIT/ML IV SOLN
INTRAVENOUS | Status: AC
  Filled 2023-08-26: qty 5

## 2023-08-26 MED ORDER — SODIUM CHLORIDE 0.9 % IV SOLN: INTRAVENOUS | Status: DC

## 2023-08-26 NOTE — Consult Note (Signed)
 Initial Consultation Note   Patient: Christian [name] ZOX:[id number] DOB: 07-Jan-1971 PCP: Rory Collard, MD DOA: 08/26/2023 DOS: the patient was seen and examined on 08/26/2023 Primary service: No att. providers found  Referring physician: Dr. Drenda Gentle Reason for consult: Hypotension  Assessment/Plan:  Hypotension Patient is presenting with asymptomatic hypotension, noted incidentally when he presented for a scheduled bone marrow biopsy.  No infectious signs or symptoms, and overall, blood work is reassuring.  He was recently started on lisinopril  a few weeks ago, during which time he has been losing weight.  I suspect he is also experiencing a diuretic effect of his uncontrolled hyperglycemia.  Lastly, he did have several episodes of diarrhea earlier today, which are not unusual for him though.  Blood pressure has normalized with IV fluids.  Discussed options with patient including observation overnight to ensure continued stability of blood pressure.  He does not want to stay at this time, stating he has a dog at home.  Recommended he hold all antihypertensives for today.  He has a blood pressure cuff at home and can check his blood pressure daily.   - Hold evening carvedilol  dose - Resume carvedilol  tomorrow if blood pressure has normalized - Hold lisinopril  pending follow-up with PCP or cardiology - Push oral fluid rehydration - Return precautions discussed  Polycythemia Patient was recently diagnosed with polycythemia of unknown etiology.  He underwent 1 session of phlebotomy, and hemoglobin has been downtrending since then.  Stool was on in the ED, and patient denies any melena or hematochezia.  - Continue outpatient follow-up with oncology.  Diabetes mellitus, type II (HCC) - Strongly encourage patient work on improved blood sugar control  HTN (hypertension) - Antihypertensives as noted above   TRH will sign off at present, please call us  again when needed.  HPI: Christian [name] is a  53 y.o. male with past medical history of CAD s/p PCI with DES to left circumflex (2015), uncontrolled type 2 diabetes, hypertension, DVT on Eliquis , polycythemia, who presents to the ED due to low blood pressure.  Mr. Haskell states that he presented to the interventional radiology department earlier today for scheduled bone marrow biopsy.  At the time, he was told that he had very low blood pressure and was sent to the ED.  He states that overall, he feels well with no dizziness, including when standing.  He denies any chest pain, shortness of breath, palpitations.  He notes that he took his blood pressure medications earlier today including carvedilol  and lisinopril .  He did have 2 episodes of diarrhea earlier today that were nonbloody, nonmelanotic.  He states that this occurs every few weeks and is not unusual for him.  He denies any poor p.o. intake.  He notes that he has been losing weight over the last several months and his PCP is aware of this; he estimates approximately 10 pound weight loss over the last several months.  He denies any fever, nausea, vomiting, abdominal pain, dysuria, rash.  ED course: On arrival to the ED, patient was hypotensive at 78/57 with heart rate of 69.  He was saturating at 94% on room air.  He was afebrile 98.2.Initial workup notable for WBC 12.5, sodium 132, glucose 319, creatinine 0.83 with GFR above 60.  Troponin flat at 21 and 20.  Lactic acid 1.7.  BNP 71.  Chest x-ray with no active disease.  Patient started on IV fluids and TRH consulted.  Review of Systems: As mentioned in the history of present illness.  All other systems reviewed and are negative.  Past Medical History:  Diagnosis Date   CAD (coronary artery disease)    a. 07/2013 NSTEMI/PCI: LCX 79m (4.0x23 Xience DES), RCA 78m, EF > 55%;  b. 07/2013 Echo: EF 60-65%, diast dysfxn, mild LVH, mildly dil LA, nl RV size/fxn, nl RVSP.   Diabetes mellitus without complication (HCC)    History of tobacco abuse     a. quit ~ 2010   Hyperlipidemia    Morbid obesity (HCC)    a. weighed 168 @ age 36.   Scoliosis    Stroke [facility])    Past Surgical History:  Procedure Laterality Date   ADENOIDECTOMY     CARDIAC CATHETERIZATION  07/23/2013   [facility]'x1 stent   COLONOSCOPY N/A 09/05/2022   Procedure: COLONOSCOPY;  Surgeon: Quintin Buckle, DO;  Location: [facility] ENDOSCOPY;  Service: Gastroenterology;  Laterality: N/A;   CORONARY ANGIOPLASTY     with stent   Social History:  reports that he quit smoking about 15 years ago. His smoking use included cigarettes. He has never used smokeless tobacco. He reports that he does not currently use alcohol. He reports that he does not currently use drugs after having used the following drugs: Marijuana.  No Known Allergies  Family History  Problem Relation Age of Onset   Heart disease Mother    Esophageal cancer Father    Diabetes Sister     Prior to Admission medications   Medication Sig Start Date End Date Taking? Authorizing Provider  apixaban  (ELIQUIS ) 2.5 MG TABS tablet TAKE 1 TABLET BY MOUTH 2 TIMES DAILY. 08/04/23  Yes Gollan, Timothy J, MD  atorvastatin  (LIPITOR ) 80 MG tablet Take 1 tablet (80 mg total) by mouth daily. 01/21/23  Yes Gollan, Timothy J, MD  carvedilol  (COREG ) 25 MG tablet TAKE 1 TABLET (25 MG TOTAL) BY MOUTH TWICE A DAY WITH MEALS 03/10/23  Yes Gollan, Timothy J, MD  glimepiride  (AMARYL ) 2 MG tablet Take 2 mg by mouth 2 (two) times daily.   Yes [provider]  Lancets (UNISTIK 2 NORMAL) MISC 1 each by Other route. 07/30/21  Yes [provider]  lisinopril  (ZESTRIL ) 20 MG tablet Take 1 tablet (20 mg total) by mouth daily. 07/28/23 10/26/23 Yes Gollan, Timothy J, MD  metFORMIN  (GLUCOPHAGE -XR) 750 MG 24 hr tablet Take 750 mg by mouth daily with breakfast. 08/22/23 08/21/24 Yes [provider]  PRECISION QID TEST test strip 1 strip. 08/12/23  Yes [provider]  mirtazapine  (REMERON ) 30 MG tablet Take 1 tablet (30  mg total) by mouth at bedtime. Patient not taking: Reported on 07/15/2023 04/27/21   Josephine Nicolas, PA-C    Physical Exam: Vitals:   08/26/23 1130 08/26/23 1200 08/26/23 1330 08/26/23 1400  BP: 92/71 95/69 96/70  101/69  Pulse: 68 68 66 67  Resp: 17 13 16 13   Temp:      TempSrc:      SpO2: 99% 98% 98% 98%  Weight:      Height:       Physical Exam Vitals and nursing note reviewed.  Constitutional:      Appearance: He is overweight.  HENT:     Head: Normocephalic and atraumatic.     Mouth/Throat:     Mouth: Mucous membranes are moist.     Pharynx: Oropharynx is clear.  Eyes:     Conjunctiva/sclera: Conjunctivae normal.     Pupils: Pupils are equal, round, and reactive to light.  Cardiovascular:  Rate and Rhythm: Normal rate and regular rhythm.     Heart sounds: No murmur heard.    No gallop.  Pulmonary:     Effort: Pulmonary effort is normal. No respiratory distress.     Breath sounds: Normal breath sounds.  Abdominal:     General: Bowel sounds are normal. There is no distension.     Palpations: Abdomen is soft.     Tenderness: There is no abdominal tenderness. There is no guarding.  Musculoskeletal:     Right lower leg: No edema.     Left lower leg: No edema.  Skin:    General: Skin is warm and dry.  Neurological:     General: No focal deficit present.     Mental Status: He is alert and oriented to person, place, and time. Mental status is at baseline.  Psychiatric:        Mood and Affect: Mood normal.        Behavior: Behavior normal.    Data Reviewed:  CBC with WBC of 7.0, hemoglobin of 12.5, platelets of 239 CMP with sodium of 132, potassium 3.8, bicarb 27, glucose 319, BUN 11, creatinine 0.83, AST 19, ALT 14, and GFR above 60 BNP 71 Troponin 21 and then 20 Lactic acid 1.7  DG Chest 1 View Result Date: 08/26/2023 CLINICAL DATA:  Weakness EXAM: CHEST  1 VIEW COMPARISON:  12/12/2020 CT.  Most recent plain film 08/19/2019 FINDINGS: Numerous leads and  wires project over the chest. Apical lordotic positioning. Cardiomegaly accentuated by AP portable technique. No pleural effusion or pneumothorax. No congestive failure. IMPRESSION: Cardiomegaly without congestive failure. Electronically Signed   By: Lore Rode M.D.   On: 08/26/2023 10:53   There are no new results to review at this time.   Family Communication: No family at bedside. Primary team communication: Notified in person.   Thank you very much for involving us  in the care of your patient.  Author: Avi Body, MD 08/26/2023 4:09 PM  For on call review [email or web address].

## 2023-08-26 NOTE — Telephone Encounter (Addendum)
 Patient was scheduled for bm bx today 08/26/23 appointment time 9:30 arrival time 8:30; was going to call patho to have myeloid mutation panel / ngs upon completion.  Per IR's note "Patient presented for image guided bone marrow biopsy today.  Patient reports that he has no eaten since Sunday and has no appetite.  His BP was 81/58 and Blood sugar is 327.  BP did not improve after 250 ml fluid bolus.  Patient does not have anyone at home with him.  Based on hypotension and hyperglycemia, will send patient to ED for evaluation.  We can re-schedule bone marrow biopsy".  Patient is still currently in the ED.

## 2023-08-26 NOTE — ED Notes (Signed)
 Pt ambulatory from bed to toilet independently with steady gait. Pt tolerated activity without symptoms

## 2023-08-26 NOTE — Assessment & Plan Note (Signed)
-   Strongly encourage patient work on improved blood sugar control

## 2023-08-26 NOTE — Assessment & Plan Note (Addendum)
 Patient is presenting with asymptomatic hypotension, noted incidentally when he presented for a scheduled bone marrow biopsy.  No infectious signs or symptoms, and overall, blood work is reassuring.  He was recently started on lisinopril  a few weeks ago, during which time he has been losing weight.  I suspect he is also experiencing a diuretic effect of his uncontrolled hyperglycemia.  Lastly, he did have several episodes of diarrhea earlier today, which are not unusual for him though.  Blood pressure has normalized with IV fluids.  Discussed options with patient including observation overnight to ensure continued stability of blood pressure.  He does not want to stay at this time, stating he has a dog at home.  Recommended he hold all antihypertensives for today.  He has a blood pressure cuff at home and can check his blood pressure daily.   - Hold evening carvedilol  dose - Resume carvedilol  tomorrow if blood pressure has normalized - Hold lisinopril  pending follow-up with PCP or cardiology - Push oral fluid rehydration - Return precautions discussed

## 2023-08-26 NOTE — Progress Notes (Signed)
 Patient presented for image guided bone marrow biopsy today.  Patient reports that he has no eaten since Sunday and has no appetite.  His BP was 81/58 and Blood sugar is 327.  BP did not improve after 250 ml fluid bolus.  Patient does not have anyone at home with him.  Based on hypotension and hyperglycemia, will send patient to ED for evaluation.  We can re-schedule bone marrow biopsy.

## 2023-08-26 NOTE — ED Triage Notes (Signed)
 Pt transferred to ED from [facility] due to hypotension. Pt systolic BP was 80 with a MAP of 70. Pt given NS bolus with no improvement. Pt reports feeling fatigued. Per RN at specials, CBG over 300. Pt took morning BP meds, not glipizide . Pt reports diarrhea 3x this morning. Pt was here for bone marrow biopsy. Pt axox4.

## 2023-08-26 NOTE — Progress Notes (Signed)
 Pt presented for IR bone marrow biopsy. Pt stated that he felt fatigued and BP was found to be in the 80s systolic. He did not eat over the last 24 hours, only drank sweet tea. He does not feel that is coming down with any sickness. States that he took his BP meds this morning and did not check his BP first. IR MD ordered 250 ml bolus. BP did not improve with bolus. IR MD advised that pt should go to ED. This RN is taking pt to ED room 12 for further care.

## 2023-08-26 NOTE — Assessment & Plan Note (Signed)
-   Antihypertensives as noted above

## 2023-08-26 NOTE — Assessment & Plan Note (Signed)
 Patient was recently diagnosed with polycythemia of unknown etiology.  He underwent 1 session of phlebotomy, and hemoglobin has been downtrending since then.  Stool was on in the ED, and patient denies any melena or hematochezia.  - Continue outpatient follow-up with oncology.

## 2023-08-26 NOTE — Discharge Instructions (Signed)
 Hold your lisinopril  and your Coreg , please take your blood pressures at home and if they are above systolic blood pressure of 120, you can take your Coreg .  Please make sure to follow-up with your primary care doctor this week to get reassessed and to get your blood pressure medications adjusted.

## 2023-08-26 NOTE — ED Provider Notes (Addendum)
 [facility] Provider Note    Event Date/Time   First MD Initiated Contact with Patient 08/26/23 1009     (approximate)   History   Hypotension   HPI  Christian [name] is a 53 y.o. male with history of diabetes, CAD, hypertension, polycythemia, presenting with hypotension.  Patient was at IR for planned bone marrow biopsy based on to be hypotensive.  Per RN at [facility], his blood glucose was over 300, his systolic blood pressure was in the 80s with a MAP of 70, he was given a small mount of fluids, patient had reported feeling fatigued.  He denies any lightheadedness or new focal weakness or numbness.  No trauma or falls.  He denies any blood in stool or black stool.  No cough, shortness of breath, chest pain, leg swelling, abdominal pain, nausea or vomiting.  Patient states that he had 3 episodes of diarrhea today.  He denies any recent travel or hospitalizations, no recent antibiotic use.  Did take his blood pressure medications in the morning, did not take his diabetes medications today.  He has no SI.  He states that he is not getting current treatment for the polycythemia.  Independent history obtained from the RN at [facility]s.  On independent chart review, he was seen by his primary care doctor on May 20, does have history of a DVT and is on Eliquis .  Has history of polycythemia, elevated hemoglobin to 18.8 at the end of April.  Is being seen by heme-onc for this and has a planned bone marrow biopsy today.  At that time also noted nausea and vomiting for 3 days that resolved, he denies any nausea or vomiting today.  He was also seen by heme-onc on 20 May, they schedule him a bone biopsy and will follow-up with him to discuss biopsy results and possible phlebotomy.     Physical Exam   Triage Vital Signs: ED Triage Vitals  Encounter Vitals Group     BP 08/26/23 1009 91/64     Systolic BP Percentile --      Diastolic BP Percentile --      Pulse Rate 08/26/23  1009 70     Resp 08/26/23 1009 16     Temp 08/26/23 1009 98.2 F (36.8 C)     Temp Source 08/26/23 1009 Oral     SpO2 08/26/23 1009 96 %     Weight 08/26/23 1010 162 lb 14.7 oz (73.9 kg)     Height 08/26/23 1010 5\' 6"  (1.676 m)     Head Circumference --      Peak Flow --      Pain Score 08/26/23 1010 0     Pain Loc --      Pain Education --      Exclude from Growth Chart --     Most recent vital signs: Vitals:   08/26/23 1330 08/26/23 1400  BP: 96/70 101/69  Pulse: 66 67  Resp: 16 13  Temp:    SpO2: 98% 98%     General: Awake, no distress.  CV:  Good peripheral perfusion.  Resp:  Normal effort.  No tachypnea or respiratory distress Abd:  No distention.  Soft nontender Other:  Moving all 4 extremities without obvious weakness, rectal exam was done with chaperone, brown stool in rectal vault.   ED Results / Procedures / Treatments   Labs (all labs ordered are listed, but only abnormal results are displayed) Labs Reviewed  CBC WITH  DIFFERENTIAL/PLATELET - Abnormal; Notable for the following components:      Result Value   RBC 3.66 (*)    Hemoglobin 12.5 (*)    HCT 36.1 (*)    MCH 34.2 (*)    All other components within normal limits  COMPREHENSIVE METABOLIC PANEL WITH GFR - Abnormal; Notable for the following components:   Sodium 132 (*)    Chloride 92 (*)    Glucose, Bld 319 (*)    Calcium  8.8 (*)    Total Protein 5.7 (*)    Albumin 3.2 (*)    Total Bilirubin 2.0 (*)    All other components within normal limits  TROPONIN I (HIGH SENSITIVITY) - Abnormal; Notable for the following components:   Troponin I (High Sensitivity) 21 (*)    All other components within normal limits  TROPONIN I (HIGH SENSITIVITY) - Abnormal; Notable for the following components:   Troponin I (High Sensitivity) 20 (*)    All other components within normal limits  GASTROINTESTINAL PANEL BY PCR, STOOL (REPLACES STOOL CULTURE)  C DIFFICILE QUICK SCREEN W PCR REFLEX    LACTIC ACID,  PLASMA  BRAIN NATRIURETIC PEPTIDE     EKG  EKG shows, sinus rhythm, rate of 70, normal QRS, normal QTc, no ischemic ST elevation, T wave inversion to 1, aVL, T wave flattening to 2, T wave changes new compared to prior   RADIOLOGY On my independent interpretation, chest x-ray without obvious consolidation   PROCEDURES:  Critical Care performed: No  Ultrasound ED Echo  Date/Time: 08/26/2023 1:24 PM  Performed by: Shane Darling, MD Authorized by: Shane Darling, MD   Procedure details:    Indications: hypotension     Views: subxiphoid, parasternal long axis view and apical 4 chamber view     Images: not archived   Findings:    Pericardium: no pericardial effusion     LV Function: normal (>50% EF) and depressed (30 - 50%)     RV Diameter: normal     IVC: normal   Impression:    Impression: normal      MEDICATIONS ORDERED IN ED: Medications  lactated ringers  bolus 1,000 mL (0 mLs Intravenous Stopped 08/26/23 1151)  lactated ringers  bolus 1,000 mL (1,000 mLs Intravenous New Bag/Given 08/26/23 1239)     IMPRESSION / MDM / ASSESSMENT AND PLAN / ED COURSE  I reviewed the triage vital signs and the nursing notes.                              Differential diagnosis includes, but is not limited to, dehydration, electrolyte derangements, medication side effects, gastroenteritis.  No abdominal pain on exam to suggest colitis or diverticulitis.  Will get labs, fluids.  Patient's presentation is most consistent with acute presentation with potential threat to life or bodily function.  Independent interpretation of labs and imaging below.  Bedside echo without obvious pericardial effusion, contractility appears normal, IVC is normal, mildly collapsing.  On reassessment patient has no lightheadedness, but repeat blood pressure in the systolic mid 80s, he is in the middle of his second liter of fluids.  Given the repeated drops in his blood pressure, consulted hospitalist who evaluated the  patient, his repeat blood pressures are systolic 100s after fluids.  Patient is still asymptomatic, no urinary symptoms or fever.  He did get started on lisinopril  recently 40 mg, she recommended that he hold his lisinopril , take his blood pressures at  home and to hold off restarting his Coreg  until his blood pressures are normalized.  Shared decision making done with patient and he would prefer to go home and follow-up with primary care doctor outpatient, I believe this is reasonable given that he is asymptomatic the entire time and his blood pressures were improving and stabilizing after fluids and he did take all his blood pressure medication this morning.  Will have him follow-up with his primary care doctor this week for further management of his BP meds.  Strict precautions given.  Discharge.  The patient is on the cardiac monitor to evaluate for evidence of arrhythmia and/or significant heart rate changes.   Clinical Course as of 08/26/23 1441  Tue Aug 26, 2023  1100 DG Chest 1 View Cardiomegaly without congestive failure.  [TT]  1221 On reassessment patient's blood pressure is still in mid 90s, he denies any lightheadedness or pain anywhere.  Will give him another liter of fluids and reassess. [TT]  1303 Pt was able to walk to the bathroom without any lightheadedness or dizziness, he has steady ambulation. [TT]  1329 Troponin I (High Sensitivity)(!) Troponin x 2 stable. [TT]    Clinical Course User Index [TT] Drenda Gentle Richard Champion, MD     FINAL CLINICAL IMPRESSION(S) / ED DIAGNOSES   Final diagnoses:  Hypotension, unspecified hypotension type  Diarrhea, unspecified type  Medication side effect     Rx / DC Orders   ED Discharge Orders     None        Note:  This document was prepared using Dragon voice recognition software and may include unintentional dictation errors.    Shane Darling, MD 08/26/23 1351    Shane Darling, MD 08/26/23 [id number]

## 2023-08-27 ENCOUNTER — Encounter: Payer: Self-pay | Admitting: Oncology

## 2023-08-27 NOTE — Telephone Encounter (Signed)
 IR's note from 08/26/23 "Patient presented for image guided bone marrow biopsy today. Patient reports that he has no eaten since Sunday and has no appetite. His BP was 81/58 and Blood sugar is 327. BP did not improve after 250 ml fluid bolus. Patient does not have anyone at home with him. Based on hypotension and hyperglycemia, will send patient to ED for evaluation. We can re-schedule bone marrow biopsy. "   Dr. Randy Buttery informed and we will hold off on bm bx for now.

## 2023-09-12 ENCOUNTER — Inpatient Hospital Stay (HOSPITAL_BASED_OUTPATIENT_CLINIC_OR_DEPARTMENT_OTHER): Admitting: Oncology

## 2023-09-12 ENCOUNTER — Inpatient Hospital Stay: Attending: Oncology

## 2023-09-12 ENCOUNTER — Encounter: Payer: Self-pay | Admitting: Oncology

## 2023-09-12 ENCOUNTER — Inpatient Hospital Stay

## 2023-09-12 VITALS — BP 151/99 | HR 105 | Temp 98.7°F | Resp 20 | Ht 66.0 in | Wt 164.2 lb

## 2023-09-12 DIAGNOSIS — Z86718 Personal history of other venous thrombosis and embolism: Secondary | ICD-10-CM | POA: Insufficient documentation

## 2023-09-12 DIAGNOSIS — Z7901 Long term (current) use of anticoagulants: Secondary | ICD-10-CM | POA: Insufficient documentation

## 2023-09-12 DIAGNOSIS — D751 Secondary polycythemia: Secondary | ICD-10-CM | POA: Insufficient documentation

## 2023-09-12 DIAGNOSIS — I2510 Atherosclerotic heart disease of native coronary artery without angina pectoris: Secondary | ICD-10-CM | POA: Insufficient documentation

## 2023-09-12 LAB — CBC WITH DIFFERENTIAL (CANCER CENTER ONLY)
Abs Immature Granulocytes: 0.04 10*3/uL (ref 0.00–0.07)
Basophils Absolute: 0.1 10*3/uL (ref 0.0–0.1)
Basophils Relative: 1 %
Eosinophils Absolute: 0.2 10*3/uL (ref 0.0–0.5)
Eosinophils Relative: 4 %
HCT: 38 % — ABNORMAL LOW (ref 39.0–52.0)
Hemoglobin: 13.3 g/dL (ref 13.0–17.0)
Immature Granulocytes: 1 %
Lymphocytes Relative: 36 %
Lymphs Abs: 1.9 10*3/uL (ref 0.7–4.0)
MCH: 35.6 pg — ABNORMAL HIGH (ref 26.0–34.0)
MCHC: 35 g/dL (ref 30.0–36.0)
MCV: 101.6 fL — ABNORMAL HIGH (ref 80.0–100.0)
Monocytes Absolute: 0.4 10*3/uL (ref 0.1–1.0)
Monocytes Relative: 7 %
Neutro Abs: 2.7 10*3/uL (ref 1.7–7.7)
Neutrophils Relative %: 51 %
Platelet Count: 206 10*3/uL (ref 150–400)
RBC: 3.74 MIL/uL — ABNORMAL LOW (ref 4.22–5.81)
RDW: 14.4 % (ref 11.5–15.5)
WBC Count: 5.2 10*3/uL (ref 4.0–10.5)
nRBC: 0 % (ref 0.0–0.2)

## 2023-09-12 NOTE — Progress Notes (Signed)
No phlebotomy today.

## 2023-09-12 NOTE — Progress Notes (Signed)
 Hematology/Oncology Consult note [facility]  Telephone:([phone] [phone]  Patient Care Team: Rory Collard, MD as PCP - General (Family Medicine) Devorah Fonder, MD as PCP - Cardiology (Cardiology) Avonne Boettcher, MD as Consulting Physician (Hematology and Oncology)   Name of the patient: Christian [name]  [id number]  08-01-1970   Date of visit: 09/12/23  Diagnosis-polycythemia of unclear etiology  Chief complaint/ Reason for visit-routine follow-up of polycythemia  Heme/Onc history: Patient is a 53 year old male with a past medical history significant for hypertension type 2 diabetes, prior history of stroke in 2021.  He had coronary artery disease and underwent stent placement in 2015.  Prior history of DVT in his right lower extremity.  He is on Eliquis  for the same 2.5 mg twice daily.  He has been referred for polycythemia.  CBC on 07/07/2023 showed an H&H of 20.5/59.3 with an MCV of 106.  White count was 7.3 and a platelet count of 305.    Results of blood work from 07/15/2023 showed an H&H of 19.7/56.2 with an MCV of 101.4.  Patient underwent 1 session of phlebotomy for the same.  Testosterone  levels were low at 105.  EPO levels were low normal at 2.8.  JAK2, CALR, MPL and exon 12 mutation negative  Plan was to proceed with bone marrow biopsy given unexplained polycythemia and low normal EPO levels.  However patient had an episode of hypotension and hyperglycemia during bone marrow biopsy which was aborted and patient sent to the ER    Interval history-patient feels at his baseline state of health.  He has not had any episodes of thrombosis.  He remains on Eliquis   ECOG PS- 2 Pain scale- 0   Review of systems- Review of Systems  Constitutional:  Negative for chills, fever, malaise/fatigue and weight loss.  HENT:  Negative for congestion, ear discharge and nosebleeds.   Eyes:  Negative for blurred vision.  Respiratory:  Negative for  cough, hemoptysis, sputum production, shortness of breath and wheezing.   Cardiovascular:  Negative for chest pain, palpitations, orthopnea and claudication.  Gastrointestinal:  Negative for abdominal pain, blood in stool, constipation, diarrhea, heartburn, melena, nausea and vomiting.  Genitourinary:  Negative for dysuria, flank pain, frequency, hematuria and urgency.  Musculoskeletal:  Negative for back pain, joint pain and myalgias.  Skin:  Negative for rash.  Neurological:  Negative for dizziness, tingling, focal weakness, seizures, weakness and headaches.  Endo/Heme/Allergies:  Does not bruise/bleed easily.  Psychiatric/Behavioral:  Negative for depression and suicidal ideas. The patient does not have insomnia.       No Known Allergies   Past Medical History:  Diagnosis Date   CAD (coronary artery disease)    a. 07/2013 NSTEMI/PCI: LCX 22m (4.0x23 Xience DES), RCA 44m, EF > 55%;  b. 07/2013 Echo: EF 60-65%, diast dysfxn, mild LVH, mildly dil LA, nl RV size/fxn, nl RVSP.   Diabetes mellitus without complication (HCC)    History of tobacco abuse    a. quit ~ 2010   Hyperlipidemia    Morbid obesity (HCC)    a. weighed 168 @ age 50.   Scoliosis    Stroke [facility])      Past Surgical History:  Procedure Laterality Date   ADENOIDECTOMY     CARDIAC CATHETERIZATION  07/23/2013   [facility]'x1 stent   COLONOSCOPY N/A 09/05/2022   Procedure: COLONOSCOPY;  Surgeon: Quintin Buckle, DO;  Location: [facility] ENDOSCOPY;  Service: Gastroenterology;  Laterality: N/A;   CORONARY  ANGIOPLASTY     with stent    Social History   Socioeconomic History   Marital status: Single    Spouse name: Not on file   Number of children: 2   Years of education: Not on file   Highest education level: Not on file  Occupational History    Comment: Disability  Tobacco Use   Smoking status: Former    Current packs/day: 0.00    Types: Cigarettes    Quit date: 2010    Years since quitting: 15.4   Smokeless  tobacco: Never  Vaping Use   Vaping status: Never Used  Substance and Sexual Activity   Alcohol use: Not Currently    Comment: 3 drinks a day   Drug use: Not Currently    Types: Marijuana    Comment: past   Sexual activity: Not Currently  Other Topics Concern   Not on file  Social History Narrative   Not on file   Social Drivers of Health   Financial Resource Strain: Patient Declined (02/02/2023)   Received from [facility]   Overall Financial Resource Strain (CARDIA)    Difficulty of Paying Living Expenses: Patient declined  Food Insecurity: No Food Insecurity (07/15/2023)   Hunger Vital Sign    Worried About Running Out of Food in the Last Year: Never true    Ran Out of Food in the Last Year: Never true  Transportation Needs: No Transportation Needs (07/15/2023)   PRAPARE - Administrator, Civil Service (Medical): No    Lack of Transportation (Non-Medical): No  Physical Activity: Not on file  Stress: Not on file  Social Connections: Not on file  Intimate Partner Violence: Not At Risk (07/15/2023)   Humiliation, Afraid, Rape, and Kick questionnaire    Fear of Current or Ex-Partner: No    Emotionally Abused: No    Physically Abused: No    Sexually Abused: No    Family History  Problem Relation Age of Onset   Heart disease Mother    Esophageal cancer Father    Diabetes Sister      Current Outpatient Medications:    apixaban  (ELIQUIS ) 2.5 MG TABS tablet, TAKE 1 TABLET BY MOUTH 2 TIMES DAILY., Disp: 60 tablet, Rfl: 5   atorvastatin  (LIPITOR ) 80 MG tablet, Take 1 tablet (80 mg total) by mouth daily., Disp: 90 tablet, Rfl: 3   carvedilol  (COREG ) 25 MG tablet, TAKE 1 TABLET (25 MG TOTAL) BY MOUTH TWICE A DAY WITH MEALS, Disp: 180 tablet, Rfl: 3   glimepiride  (AMARYL ) 2 MG tablet, Take 2 mg by mouth 2 (two) times daily., Disp: , Rfl:    Lancets (UNISTIK 2 NORMAL) MISC, 1 each by Other route., Disp: , Rfl:    lisinopril  (ZESTRIL ) 20 MG tablet,  Take 1 tablet (20 mg total) by mouth daily., Disp: 90 tablet, Rfl: 3   metFORMIN  (GLUCOPHAGE -XR) 750 MG 24 hr tablet, Take 750 mg by mouth daily with breakfast., Disp: , Rfl:    mirtazapine  (REMERON ) 30 MG tablet, Take 1 tablet (30 mg total) by mouth at bedtime. (Patient not taking: Reported on 07/15/2023), Disp: 30 tablet, Rfl: 3   PRECISION QID TEST test strip, 1 strip., Disp: , Rfl:   Physical exam:  Vitals:   09/12/23 1349  Height: 5' 6 (1.676 m)   Physical Exam  Cardiovascular:     Rate and Rhythm: Normal rate and regular rhythm.     Heart sounds: Normal heart sounds.  Pulmonary:  Effort: Pulmonary effort is normal.     Breath sounds: Normal breath sounds.  Abdominal:     General: Bowel sounds are normal.   Skin:    General: Skin is warm and dry.   Neurological:     Mental Status: He is alert and oriented to person, place, and time.      I have personally reviewed labs listed below:    Latest Ref Rng & Units 08/26/2023   10:25 AM  CMP  Glucose 70 - 99 mg/dL 657   BUN 6 - 20 mg/dL 11   Creatinine 8.46 - 1.24 mg/dL 9.62   Sodium 952 - 841 mmol/L 132   Potassium 3.5 - 5.1 mmol/L 3.8   Chloride 98 - 111 mmol/L 92   CO2 22 - 32 mmol/L 27   Calcium  8.9 - 10.3 mg/dL 8.8   Total Protein 6.5 - 8.1 g/dL 5.7   Total Bilirubin 0.0 - 1.2 mg/dL 2.0   Alkaline Phos 38 - 126 U/L 70   AST 15 - 41 U/L 19   ALT 0 - 44 U/L 14       Latest Ref Rng & Units 09/12/2023    1:49 PM  CBC  WBC 4.0 - 10.5 K/uL 5.2   Hemoglobin 13.0 - 17.0 g/dL 32.4   Hematocrit 40.1 - 52.0 % 38.0   Platelets 150 - 400 K/uL 206    I have personally reviewed Radiology images listed below: No images are attached to the encounter.  DG Chest 1 View Result Date: 08/26/2023 CLINICAL DATA:  Weakness EXAM: CHEST  1 VIEW COMPARISON:  12/12/2020 CT.  Most recent plain film 08/19/2019 FINDINGS: Numerous leads and wires project over the chest. Apical lordotic positioning. Cardiomegaly accentuated by AP  portable technique. No pleural effusion or pneumothorax. No congestive failure. IMPRESSION: Cardiomegaly without congestive failure. Electronically Signed   By: Lore Rode M.D.   On: 08/26/2023 10:53     Assessment and plan- Patient is a 53 y.o. male with history of secondary polycythemia of unclear etiology  Patient's hemoglobin has been between 18-20 since April 2025.  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  Given that he had low normal EPO levels at 2.8 plan was to pursue a bone marrow biopsy given his prior history of DVT as well as coronary artery disease.  However patient had an episode of hypotension and hyperglycemia during the bone marrow biopsy and therefore it was aborted.Presently his hemoglobin in the last 2 weeks has remained within normal range between 12.5-13.5 without any intervention.  He did get only 1 session of phlebotomy.  I am holding off on any further phlebotomy or a bone marrow biopsy at this time.  Repeat CBC in 2 months in 4 months and I will see him back in 4 months.  If there is a consistent increase in his hemoglobin levels again I will pursue a bone marrow biopsy at that time   Visit Diagnosis 1. Polycythemia      Dr. Seretha Dance, MD, MPH [facility] at [facility] [phone] 09/12/2023 1:59 PM

## 2023-09-12 NOTE — Progress Notes (Signed)
 Patient has no new or acute concerns at this time. Although his BP & Pulse is elevated, he states he's feeling fine and forgot to take his medication this morning.

## 2023-11-12 ENCOUNTER — Inpatient Hospital Stay: Attending: Oncology

## 2023-11-12 DIAGNOSIS — D751 Secondary polycythemia: Secondary | ICD-10-CM | POA: Insufficient documentation

## 2023-11-12 LAB — CBC WITH DIFFERENTIAL (CANCER CENTER ONLY)
Abs Immature Granulocytes: 0.02 K/uL (ref 0.00–0.07)
Basophils Absolute: 0.1 K/uL (ref 0.0–0.1)
Basophils Relative: 1 %
Eosinophils Absolute: 0.2 K/uL (ref 0.0–0.5)
Eosinophils Relative: 4 %
HCT: 36.1 % — ABNORMAL LOW (ref 39.0–52.0)
Hemoglobin: 13.1 g/dL (ref 13.0–17.0)
Immature Granulocytes: 0 %
Lymphocytes Relative: 38 %
Lymphs Abs: 2.2 K/uL (ref 0.7–4.0)
MCH: 39 pg — ABNORMAL HIGH (ref 26.0–34.0)
MCHC: 36.3 g/dL — ABNORMAL HIGH (ref 30.0–36.0)
MCV: 107.4 fL — ABNORMAL HIGH (ref 80.0–100.0)
Monocytes Absolute: 0.3 K/uL (ref 0.1–1.0)
Monocytes Relative: 6 %
Neutro Abs: 2.9 K/uL (ref 1.7–7.7)
Neutrophils Relative %: 51 %
Platelet Count: 199 K/uL (ref 150–400)
RBC: 3.36 MIL/uL — ABNORMAL LOW (ref 4.22–5.81)
RDW: 12.9 % (ref 11.5–15.5)
WBC Count: 5.7 K/uL (ref 4.0–10.5)
nRBC: 0 % (ref 0.0–0.2)

## 2023-11-13 LAB — ERYTHROPOIETIN: Erythropoietin: 6.5 m[IU]/mL (ref 2.6–18.5)

## 2024-01-12 ENCOUNTER — Inpatient Hospital Stay: Admitting: Oncology

## 2024-01-12 ENCOUNTER — Inpatient Hospital Stay: Attending: Oncology

## 2024-01-12 ENCOUNTER — Encounter: Payer: Self-pay | Admitting: Oncology

## 2024-01-12 VITALS — BP 135/99 | HR 99 | Temp 98.0°F | Resp 18 | Ht 66.0 in | Wt 162.3 lb

## 2024-01-12 DIAGNOSIS — D751 Secondary polycythemia: Secondary | ICD-10-CM | POA: Diagnosis not present

## 2024-01-12 DIAGNOSIS — Z7901 Long term (current) use of anticoagulants: Secondary | ICD-10-CM | POA: Diagnosis not present

## 2024-01-12 DIAGNOSIS — Z87891 Personal history of nicotine dependence: Secondary | ICD-10-CM | POA: Diagnosis not present

## 2024-01-12 LAB — CBC WITH DIFFERENTIAL (CANCER CENTER ONLY)
Abs Immature Granulocytes: 0.03 K/uL (ref 0.00–0.07)
Basophils Absolute: 0.1 K/uL (ref 0.0–0.1)
Basophils Relative: 1 %
Eosinophils Absolute: 0.2 K/uL (ref 0.0–0.5)
Eosinophils Relative: 3 %
HCT: 37.3 % — ABNORMAL LOW (ref 39.0–52.0)
Hemoglobin: 13.4 g/dL (ref 13.0–17.0)
Immature Granulocytes: 0 %
Lymphocytes Relative: 43 %
Lymphs Abs: 3.1 K/uL (ref 0.7–4.0)
MCH: 34.4 pg — ABNORMAL HIGH (ref 26.0–34.0)
MCHC: 35.9 g/dL (ref 30.0–36.0)
MCV: 95.9 fL (ref 80.0–100.0)
Monocytes Absolute: 0.4 K/uL (ref 0.1–1.0)
Monocytes Relative: 6 %
Neutro Abs: 3.5 K/uL (ref 1.7–7.7)
Neutrophils Relative %: 47 %
Platelet Count: 338 K/uL (ref 150–400)
RBC: 3.89 MIL/uL — ABNORMAL LOW (ref 4.22–5.81)
RDW: 13.5 % (ref 11.5–15.5)
WBC Count: 7.3 K/uL (ref 4.0–10.5)
nRBC: 0 % (ref 0.0–0.2)

## 2024-01-12 NOTE — Progress Notes (Signed)
 Hematology/Oncology Consult note [facility]  [phone] [phone]  Patient Care Team: Glover Lenis, MD as PCP - General (Family Medicine) Perla Evalene PARAS, MD as PCP - Cardiology (Cardiology) Melanee Annah BROCKS, MD as Consulting Physician (Hematology and Oncology)   Name of the patient: Christian [name]  [id number]  08-29-1970   Date of visit: 01/12/24  Diagnosis-history of polycythemia of unclear etiology  Chief complaint/ Reason for visit-routine follow-up of polycythemia  Heme/Onc history:  Patient is a 53 year old male with a past medical history significant for hypertension type 2 diabetes, prior history of stroke in 2021.  He had coronary artery disease and underwent stent placement in 2015.  Prior history of DVT in his right lower extremity.  He is on Eliquis  for the same 2.5 mg twice daily.  He has been referred for polycythemia.  CBC on 07/07/2023 showed an H&H of 20.5/59.3 with an MCV of 106.  White count was 7.3 and a platelet count of 305.    Results of blood work from 07/15/2023 showed an H&H of 19.7/56.2 with an MCV of 101.4.  Patient underwent 1 session of phlebotomy for the same.  Testosterone  levels were low at 105.  EPO levels were low normal at 2.8.  JAK2, CALR, MPL and exon 12 mutation negative   Plan was to proceed with bone marrow biopsy given unexplained polycythemia and low normal EPO levels.  However patient had an episode of hypotension and hyperglycemia during bone marrow biopsy which was aborted and patient sent to the ER. Patient's hemoglobin has been stable between 13-14 since June 2025      Interval history-he reports doing well presently and denies any complaints at this time  ECOG PS- 1 Pain scale- 0   Review of systems- Review of Systems  Constitutional:  Negative for chills, fever, malaise/fatigue and weight loss.  HENT:  Negative for congestion, ear discharge and nosebleeds.   Eyes:  Negative for  blurred vision.  Respiratory:  Negative for cough, hemoptysis, sputum production, shortness of breath and wheezing.   Cardiovascular:  Negative for chest pain, palpitations, orthopnea and claudication.  Gastrointestinal:  Negative for abdominal pain, blood in stool, constipation, diarrhea, heartburn, melena, nausea and vomiting.  Genitourinary:  Negative for dysuria, flank pain, frequency, hematuria and urgency.  Musculoskeletal:  Negative for back pain, joint pain and myalgias.  Skin:  Negative for rash.  Neurological:  Negative for dizziness, tingling, focal weakness, seizures, weakness and headaches.  Endo/Heme/Allergies:  Does not bruise/bleed easily.  Psychiatric/Behavioral:  Negative for depression and suicidal ideas. The patient does not have insomnia.       No Known Allergies   Past Medical History:  Diagnosis Date   CAD (coronary artery disease)    a. 07/2013 NSTEMI/PCI: LCX 3m (4.0x23 Xience DES), RCA 100m, EF > 55%;  b. 07/2013 Echo: EF 60-65%, diast dysfxn, mild LVH, mildly dil LA, nl RV size/fxn, nl RVSP.   Diabetes mellitus without complication (HCC)    History of tobacco abuse    a. quit ~ 2010   Hyperlipidemia    Morbid obesity (HCC)    a. weighed 168 @ age 63.   Scoliosis    Stroke [facility])      Past Surgical History:  Procedure Laterality Date   ADENOIDECTOMY     CARDIAC CATHETERIZATION  07/23/2013   [facility]'x1 stent   COLONOSCOPY N/A 09/05/2022   Procedure: COLONOSCOPY;  Surgeon: Onita Elspeth Sharper, DO;  Location: [facility] ENDOSCOPY;  Service: Gastroenterology;  Laterality: N/A;   CORONARY ANGIOPLASTY     with stent    Social History   Socioeconomic History   Marital status: Single    Spouse name: Not on file   Number of children: 2   Years of education: Not on file   Highest education level: Not on file  Occupational History    Comment: Disability  Tobacco Use   Smoking status: Former    Current packs/day: 0.00    Types: Cigarettes    Quit date: 2010     Years since quitting: 15.8   Smokeless tobacco: Never  Vaping Use   Vaping status: Never Used  Substance and Sexual Activity   Alcohol use: Not Currently    Comment: 3 drinks a day   Drug use: Not Currently    Types: Marijuana    Comment: past   Sexual activity: Not Currently  Other Topics Concern   Not on file  Social History Narrative   Not on file   Social Drivers of Health   Financial Resource Strain: Patient Declined (02/02/2023)   Received from [facility]   Overall Financial Resource Strain (CARDIA)    Difficulty of Paying Living Expenses: Patient declined  Food Insecurity: No Food Insecurity (07/15/2023)   Hunger Vital Sign    Worried About Running Out of Food in the Last Year: Never true    Ran Out of Food in the Last Year: Never true  Transportation Needs: No Transportation Needs (07/15/2023)   PRAPARE - Administrator, Civil Service (Medical): No    Lack of Transportation (Non-Medical): No  Physical Activity: Not on file  Stress: Not on file  Social Connections: Not on file  Intimate Partner Violence: Not At Risk (07/15/2023)   Humiliation, Afraid, Rape, and Kick questionnaire    Fear of Current or Ex-Partner: No    Emotionally Abused: No    Physically Abused: No    Sexually Abused: No    Family History  Problem Relation Age of Onset   Heart disease Mother    Esophageal cancer Father    Diabetes Sister      Current Outpatient Medications:    apixaban  (ELIQUIS ) 2.5 MG TABS tablet, TAKE 1 TABLET BY MOUTH 2 TIMES DAILY., Disp: 60 tablet, Rfl: 5   atorvastatin  (LIPITOR ) 80 MG tablet, Take 1 tablet (80 mg total) by mouth daily., Disp: 90 tablet, Rfl: 3   carvedilol  (COREG ) 25 MG tablet, TAKE 1 TABLET (25 MG TOTAL) BY MOUTH TWICE A DAY WITH MEALS, Disp: 180 tablet, Rfl: 3   glimepiride  (AMARYL ) 2 MG tablet, Take 2 mg by mouth 2 (two) times daily., Disp: , Rfl:    Lancets (UNISTIK 2 NORMAL) MISC, 1 each by Other route., Disp: ,  Rfl:    metFORMIN  (GLUCOPHAGE -XR) 750 MG 24 hr tablet, Take 750 mg by mouth daily with breakfast., Disp: , Rfl:    PRECISION QID TEST test strip, 1 strip., Disp: , Rfl:    lisinopril  (ZESTRIL ) 20 MG tablet, Take 1 tablet (20 mg total) by mouth daily. (Patient not taking: Reported on 09/12/2023), Disp: 90 tablet, Rfl: 3   mirtazapine  (REMERON ) 30 MG tablet, Take 1 tablet (30 mg total) by mouth at bedtime. (Patient not taking: Reported on 07/15/2023), Disp: 30 tablet, Rfl: 3  Physical exam:  Vitals:   01/12/24 1421 01/12/24 1441  BP: (!) 141/104 (!) 135/99  Pulse: 99   Resp: 18   Temp: 98 F (36.7 C)   TempSrc:  Tympanic   SpO2: 100%   Weight: 162 lb 4.8 oz (73.6 kg)   Height: 5' 6 (1.676 m)    Physical Exam Cardiovascular:     Rate and Rhythm: Normal rate and regular rhythm.     Heart sounds: Normal heart sounds.  Pulmonary:     Effort: Pulmonary effort is normal.     Breath sounds: Normal breath sounds.  Abdominal:     General: Bowel sounds are normal.     Palpations: Abdomen is soft.  Skin:    General: Skin is warm and dry.  Neurological:     Mental Status: He is alert and oriented to person, place, and time.      I have personally reviewed labs listed below:    Latest Ref Rng & Units 08/26/2023   10:25 AM  CMP  Glucose 70 - 99 mg/dL 680   BUN 6 - 20 mg/dL 11   Creatinine 9.38 - 1.24 mg/dL 9.16   Sodium 864 - 854 mmol/L 132   Potassium 3.5 - 5.1 mmol/L 3.8   Chloride 98 - 111 mmol/L 92   CO2 22 - 32 mmol/L 27   Calcium  8.9 - 10.3 mg/dL 8.8   Total Protein 6.5 - 8.1 g/dL 5.7   Total Bilirubin 0.0 - 1.2 mg/dL 2.0   Alkaline Phos 38 - 126 U/L 70   AST 15 - 41 U/L 19   ALT 0 - 44 U/L 14       Latest Ref Rng & Units 01/12/2024    2:03 PM  CBC  WBC 4.0 - 10.5 K/uL 7.3   Hemoglobin 13.0 - 17.0 g/dL 86.5   Hematocrit 60.9 - 52.0 % 37.3   Platelets 150 - 400 K/uL 338     Assessment and plan- Patient is a 53 y.o. male here for routine follow-up of  polycythemia  Elevated Hemoglobin/polycythemia Hemoglobin stable at 13.4 for four months, no intervention needed.  Prior to June 2025 his hemoglobin was as high as 19 and workup for polycythemia vera at that time was unremarkable although he did have a low normal EPO levels. - Monitor hemoglobin with blood work in three months. - Schedule follow-up in six months. - Intervene if hemoglobin trends upward.   Visit Diagnosis 1. Polycythemia      Dr. Annah Skene, MD, MPH [facility] at [facility] [phone] 01/12/2024 3:14 PM

## 2024-01-12 NOTE — Progress Notes (Signed)
 Patient states he's doing okay, but has been forgetting to take his medications.

## 2024-01-30 NOTE — Progress Notes (Addendum)
 Christian [name]                                          MRN: [id number]   01/30/2024   The [facility] Quality Team Specialist reviewed this patient medical record for the purposes of chart review for care gap closure. The following were reviewed: chart review for care gap closure-glycemic status assessment.  03/10/2024- no eye exam found   [facility] Quality Team

## 2024-02-18 DIAGNOSIS — Z8673 Personal history of transient ischemic attack (TIA), and cerebral infarction without residual deficits: Secondary | ICD-10-CM | POA: Diagnosis not present

## 2024-02-18 DIAGNOSIS — E785 Hyperlipidemia, unspecified: Secondary | ICD-10-CM | POA: Diagnosis not present

## 2024-02-18 DIAGNOSIS — E1140 Type 2 diabetes mellitus with diabetic neuropathy, unspecified: Secondary | ICD-10-CM | POA: Diagnosis not present

## 2024-02-18 DIAGNOSIS — E349 Endocrine disorder, unspecified: Secondary | ICD-10-CM | POA: Diagnosis not present

## 2024-02-18 DIAGNOSIS — R197 Diarrhea, unspecified: Secondary | ICD-10-CM | POA: Diagnosis not present

## 2024-02-18 DIAGNOSIS — I2510 Atherosclerotic heart disease of native coronary artery without angina pectoris: Secondary | ICD-10-CM | POA: Diagnosis not present

## 2024-02-18 DIAGNOSIS — E119 Type 2 diabetes mellitus without complications: Secondary | ICD-10-CM | POA: Diagnosis not present

## 2024-02-18 NOTE — Progress Notes (Signed)
 Chief Complaint:   Chief Complaint  Patient presents with  . Follow-up    BP concerns, dizzy uncontrollable bowel movements    Subjective:  Christian [name] is a 53 y.o. male in today for recheck.  DM.  Is on Glimepiride  twice a day; he is not taking Metformin .  BS's have run around 120 reportedly.  Last A1c was 9.1 on 08/12/23; MA was 9 last year. History of CVA's (Dec 2020 and May 2021).  He has tingling in his feet bilaterally and has some relative weakness in his right hand/arm.  He has a poor balance and difficulty with walking.  Is on Eliquis  for prophylaxis.  BP's have usually been controlled (120's/70's).  Is no longer taking Coreg . History of right leg DVT (2022).  Is on Eliquis  twice daily for prophylaxis.  No swelling or pain in the leg.  No SOB or dyspnea. Hyperlipidemia.  He was on Lipitor  80 mg daily.  Lipids on 01/20/23 showed TC of 181, HDL 54, LDL 104 and TG 130.  He had no side effects on the statin.  Liver enzymes were normal. Low testosterone .  Testosterone  level was 105 on 07/15/23.  Has not taken any testosterone .  Energy and libido are stable. Diarrhea.  For the past 2 weeks, he has had loose watery BM's, about 6 per day.  No melena or blood in the stool.  No abdominal pain.  Past Medical History:  Diagnosis Date  . Depression   . Diabetes mellitus without complication (CMS/HHS-HCC)   . Fracture    Past Surgical History:  Procedure Laterality Date  . adenoid removal  1980  . heart stents  2018  . Colon @ [facility]  09/05/2022   Tubular adenoma/repeat 56yr with improved prep/SMR   Family History  Problem Relation Name Age of Onset  . Dementia Mother    . Cancer Father         neck   Social History   Socioeconomic History  . Marital status: Single  . Number of children: 2  Tobacco Use  . Smoking status: Former    Current packs/day: 0.00    Types: Cigarettes    Quit date: 04/25/2006    Years since quitting: 17.8  . Smokeless tobacco: Never  Vaping Use   . Vaping status: Never Used  Substance and Sexual Activity  . Alcohol use: Not Currently    Comment: quit 11/2020  . Drug use: Never  . Sexual activity: Defer  Social History Narrative   No exercise.   Social Drivers of Health   Financial Resource Strain: Patient Declined (02/02/2023)   Overall Financial Resource Strain (CARDIA)   . Difficulty of Paying Living Expenses: Patient declined  Food Insecurity: No Food Insecurity (07/15/2023)   Received from [facility]   Hunger Vital Sign   . Within the past 12 months, you worried that your food would run out before you got the money to buy more.: Never true   . Within the past 12 months, the food you bought just didn't last and you didn't have money to get more.: Never true  Transportation Needs: No Transportation Needs (07/15/2023)   Received from [facility] - Transportation   . Lack of Transportation (Medical): No   . Lack of Transportation (Non-Medical): No  Housing Stability: Unknown (08/12/2023)   Housing Stability Vital Sign   . Unable to Pay for Housing in the Last Year: Patient declined   . Number of Times Moved in the Last  Year: 0   . Homeless in the Last Year: No   Outpatient Medications Prior to Visit  Medication Sig Dispense Refill  . carvediloL  (COREG ) 25 MG tablet Take 25 mg by mouth 2 (two) times daily    . ELIQUIS  5 mg tablet Take 5 mg by mouth 2 (two) times daily Taking2.5mg     . glimepiride  (AMARYL ) 2 MG tablet TAKE 1 TABLET BY MOUTH 2 TIMES DAILY. 180 tablet 1  . atorvastatin  (LIPITOR ) 80 MG tablet Take 1 tablet (80 mg total) by mouth once daily (Patient not taking: Reported on 02/18/2024) 30 tablet 5  . blood glucose diagnostic test strip 1 each (1 strip total) 2 (two) times daily 100 each 5  . lancing device with lancets kit Use 1 each once daily Use as instructed. 100 each 11  . metFORMIN  (GLUCOPHAGE -XR) 750 MG XR tablet Take 1 tablet (750 mg total) by mouth 2 (two) times daily (Patient not taking:  Reported on 02/18/2024) 180 tablet 1   No facility-administered medications prior to visit.   No Known Allergies  Objective:   Vitals:   02/18/24 1116  BP: 122/78  Pulse: 82  SpO2: 100%  Weight: 73.8 kg (162 lb 12.8 oz)  Height: 165.1 cm (5' 5)  PainSc: 0-No pain   Body mass index is 27.09 kg/m.   GENERAL:  Patient is alert and oriented, in no acute distress.  HEENT:   Conjunctivae are noninjected.  Oropharynx:  Moist mucous membranes.  No erythema  or lesions. RESPIRATORY:  Normal inspiratory and expiratory effort.  Lungs are clear to auscultation bilaterally. CARDIOVASCULAR:  Regular rate and rhythm, S1, S2, without murmur, rub, or gallop. ABDOMEN:  Nontender and non-distended.   EXTREMITIES:  +2 pulses bilaterally.  +2 edema.  Assessment/Plan:  Type 2 diabetes mellitus with diabetic neuropathy, without long-term current use of insulin  (CMS/HHS-HCC).  Stay on Glimepiride  2 mg twice a day.  Check A1c and Met B.  Deep vein thrombosis (DVT) of distal vein of right lower extremity, unspecified chronicity (CMS/HHS-HCC).  Stay on Eliquis  twice daily.  Acute CVA (cerebrovascular accident) (CMS/HHS-HCC).  Stay on Eliquis  for now.  Will monitor BP's.  Hyperlipidemia, unspecified hyperlipidemia type.  Check lipids and liver and consider restating Atorvastatin  80 mg.  Low testosterone .  Check hormone levels.  Consider replacement treatment.  Diarrhea.  Will check stool for infection and C Diff.  Will determine further follow up and management in the next few days.    Alm Na, MD, PhD

## 2024-02-20 ENCOUNTER — Other Ambulatory Visit: Payer: Self-pay | Admitting: Cardiovascular Disease

## 2024-02-23 NOTE — Result Encounter Note (Signed)
 Spoke with patient via phone call, ordered labs and scheduled lab appointment for tomorrow 12/2.     Patient is currently not taking atorvastatin .

## 2024-02-24 ENCOUNTER — Observation Stay: Admission: EM | Admit: 2024-02-24 | Discharge: 2024-02-27 | DRG: 641 | Disposition: A

## 2024-02-24 ENCOUNTER — Other Ambulatory Visit: Payer: Self-pay

## 2024-02-24 DIAGNOSIS — F1020 Alcohol dependence, uncomplicated: Secondary | ICD-10-CM | POA: Insufficient documentation

## 2024-02-24 DIAGNOSIS — D539 Nutritional anemia, unspecified: Secondary | ICD-10-CM | POA: Insufficient documentation

## 2024-02-24 DIAGNOSIS — E440 Moderate protein-calorie malnutrition: Secondary | ICD-10-CM | POA: Insufficient documentation

## 2024-02-24 DIAGNOSIS — D7589 Other specified diseases of blood and blood-forming organs: Secondary | ICD-10-CM | POA: Insufficient documentation

## 2024-02-24 DIAGNOSIS — I4581 Long QT syndrome: Secondary | ICD-10-CM | POA: Diagnosis not present

## 2024-02-24 DIAGNOSIS — R9431 Abnormal electrocardiogram [ECG] [EKG]: Secondary | ICD-10-CM

## 2024-02-24 DIAGNOSIS — E876 Hypokalemia: Principal | ICD-10-CM | POA: Diagnosis present

## 2024-02-24 DIAGNOSIS — R197 Diarrhea, unspecified: Secondary | ICD-10-CM | POA: Diagnosis not present

## 2024-02-24 DIAGNOSIS — R600 Localized edema: Secondary | ICD-10-CM | POA: Insufficient documentation

## 2024-02-24 DIAGNOSIS — K709 Alcoholic liver disease, unspecified: Secondary | ICD-10-CM | POA: Insufficient documentation

## 2024-02-24 LAB — COMPREHENSIVE METABOLIC PANEL WITH GFR
ALT: 19 U/L (ref 0–44)
AST: 81 U/L — ABNORMAL HIGH (ref 15–41)
Albumin: 3.3 g/dL — ABNORMAL LOW (ref 3.5–5.0)
Alkaline Phosphatase: 151 U/L — ABNORMAL HIGH (ref 38–126)
Anion gap: 13 (ref 5–15)
BUN: 6 mg/dL (ref 6–20)
CO2: 35 mmol/L — ABNORMAL HIGH (ref 22–32)
Calcium: 7.1 mg/dL — ABNORMAL LOW (ref 8.9–10.3)
Chloride: 96 mmol/L — ABNORMAL LOW (ref 98–111)
Creatinine, Ser: 0.69 mg/dL (ref 0.61–1.24)
GFR, Estimated: >60 mL/min (ref >60)
Glucose, Bld: 163 mg/dL — ABNORMAL HIGH (ref 70–99)
Potassium: 2.4 mmol/L — CL (ref 3.5–5.1)
Sodium: 143 mmol/L (ref 135–145)
Total Bilirubin: 2.6 mg/dL — ABNORMAL HIGH (ref 0.0–1.2)
Total Protein: 6.3 g/dL — ABNORMAL LOW (ref 6.5–8.1)

## 2024-02-24 LAB — CBC WITH DIFFERENTIAL/PLATELET
Abs Immature Granulocytes: 0.02 K/uL (ref 0.00–0.07)
Basophils Absolute: 0.1 K/uL (ref 0.0–0.1)
Basophils Relative: 1 %
Eosinophils Absolute: 0.2 K/uL (ref 0.0–0.5)
Eosinophils Relative: 4 %
HCT: 33.2 % — ABNORMAL LOW (ref 39.0–52.0)
Hemoglobin: 11.5 g/dL — ABNORMAL LOW (ref 13.0–17.0)
Immature Granulocytes: 0 %
Lymphocytes Relative: 40 %
Lymphs Abs: 2.3 K/uL (ref 0.7–4.0)
MCH: 36.7 pg — ABNORMAL HIGH (ref 26.0–34.0)
MCHC: 34.6 g/dL (ref 30.0–36.0)
MCV: 106.1 fL — ABNORMAL HIGH (ref 80.0–100.0)
Monocytes Absolute: 0.3 K/uL (ref 0.1–1.0)
Monocytes Relative: 6 %
Neutro Abs: 2.8 K/uL (ref 1.7–7.7)
Neutrophils Relative %: 49 %
Platelets: 310 K/uL (ref 150–400)
RBC: 3.13 MIL/uL — ABNORMAL LOW (ref 4.22–5.81)
RDW: 17.9 % — ABNORMAL HIGH (ref 11.5–15.5)
WBC: 5.8 K/uL (ref 4.0–10.5)
nRBC: 0.3 % — ABNORMAL HIGH (ref 0.0–0.2)

## 2024-02-24 LAB — MAGNESIUM: Magnesium: 1.3 mg/dL — ABNORMAL LOW (ref 1.7–2.4)

## 2024-02-24 LAB — CBG MONITORING, ED: Glucose-Capillary: 142 mg/dL — ABNORMAL HIGH (ref 70–99)

## 2024-02-24 MED ORDER — POTASSIUM CHLORIDE 20 MEQ PO PACK
40.0000 meq | PACK | Freq: Once | ORAL | Status: AC
  Administered 2024-02-24: 40 meq via ORAL
  Filled 2024-02-24: qty 2

## 2024-02-24 MED ORDER — INSULIN ASPART 100 UNIT/ML IJ SOLN
0.0000 [IU] | Freq: Three times a day (TID) | INTRAMUSCULAR | Status: DC
  Administered 2024-02-25 – 2024-02-27 (×4): 2 [IU] via SUBCUTANEOUS
  Filled 2024-02-24 (×4): qty 2

## 2024-02-24 MED ORDER — SODIUM CHLORIDE 0.9 % IV BOLUS
1000.0000 mL | Freq: Once | INTRAVENOUS | Status: AC
  Administered 2024-02-24: 1000 mL via INTRAVENOUS

## 2024-02-24 MED ORDER — POTASSIUM CHLORIDE 10 MEQ/100ML IV SOLN
10.0000 meq | Freq: Once | INTRAVENOUS | Status: AC
  Administered 2024-02-24: 10 meq via INTRAVENOUS
  Filled 2024-02-24: qty 100

## 2024-02-24 MED ORDER — MAGNESIUM SULFATE 2 GM/50ML IV SOLN
2.0000 g | Freq: Once | INTRAVENOUS | Status: AC
  Administered 2024-02-24: 2 g via INTRAVENOUS
  Filled 2024-02-24: qty 50

## 2024-02-24 MED ORDER — ACETAMINOPHEN 325 MG PO TABS: 650.0000 mg | ORAL_TABLET | Freq: Four times a day (QID) | ORAL | Status: DC | PRN

## 2024-02-24 MED ORDER — INSULIN ASPART 100 UNIT/ML IJ SOLN: 0.0000 [IU] | Freq: Every day | INTRAMUSCULAR | Status: DC

## 2024-02-24 MED ORDER — APIXABAN 2.5 MG PO TABS
2.5000 mg | ORAL_TABLET | Freq: Two times a day (BID) | ORAL | Status: DC
  Administered 2024-02-24 – 2024-02-27 (×6): 2.5 mg via ORAL
  Filled 2024-02-24 (×7): qty 1

## 2024-02-24 MED ORDER — GLIMEPIRIDE 2 MG PO TABS
2.0000 mg | ORAL_TABLET | Freq: Two times a day (BID) | ORAL | Status: DC
  Administered 2024-02-24 – 2024-02-25 (×3): 2 mg via ORAL
  Filled 2024-02-24 (×3): qty 1

## 2024-02-24 MED ORDER — CARVEDILOL 6.25 MG PO TABS
25.0000 mg | ORAL_TABLET | Freq: Two times a day (BID) | ORAL | Status: DC
  Administered 2024-02-25 – 2024-02-27 (×5): 25 mg via ORAL
  Filled 2024-02-24: qty 4
  Filled 2024-02-24 (×4): qty 1

## 2024-02-24 MED ORDER — ACETAMINOPHEN 650 MG RE SUPP: 650.0000 mg | Freq: Four times a day (QID) | RECTAL | Status: DC | PRN

## 2024-02-24 MED ORDER — CALCIUM CITRATE 950 (200 CA) MG PO TABS
200.0000 mg | ORAL_TABLET | Freq: Every day | ORAL | Status: DC
  Administered 2024-02-24 – 2024-02-27 (×4): 950 mg via ORAL
  Filled 2024-02-24 (×5): qty 1

## 2024-02-24 NOTE — H&P (Addendum)
 History and Physical    Patient: Christian [name] FMW:[id number] DOB: [birth year] DOA: 02/24/2024 DOS: the patient was seen and examined on 02/24/2024 PCP: Glover Lenis, MD  Patient coming from: Home  Chief Complaint:  Chief Complaint  Patient presents with   Abnormal Lab   HPI: Christian [name] is a 53 y.o. male with medical history significant of CAD s/p PCI with DES to left circumflex (2015),  type 2 diabetes, hypertension, DVT on Eliquis    referred to the emergency department by his PCP for low potassium and magnesium  on outpatient labs  Patient has no complaints.  He had some diarrhea for the past few weeks, however he has not had a BM since he took Imodium 6 days ago.  He denies any vomiting.  He is not on diuretics.  He does admit to decreased p.o. intake due to loss of appetite and lower extremity swelling.  He drinks about 2 alcoholic drinks nightly.  Denies history of withdrawals.  In the emergency department, he is hemodynamically stable, without leukocytosis.  His potassium 2.4, calcium  7.1, mag 1.3.  EKG was normal sinus rhythm, QTc prolonged 523. He was given a liter of fluid, 2 g of mag, and 50 mill equivalents of potassium  Admit to observation for correction of electrolyte abnormalities   Review of Systems: Review of Systems  Constitutional:  Negative for chills, fever and malaise/fatigue.  Respiratory:  Negative for cough and shortness of breath.   Cardiovascular:  Positive for leg swelling. Negative for chest pain, palpitations and claudication.  Gastrointestinal:  Negative for abdominal pain, blood in stool and nausea.  Genitourinary:  Negative for frequency.  Musculoskeletal:  Negative for joint pain.  Skin:  Negative for rash.  Neurological:  Negative for dizziness, sensory change, focal weakness and headaches.  Endo/Heme/Allergies:  Negative for polydipsia.    Past Medical History:  Diagnosis Date   CAD (coronary artery disease)    a. 07/2013 NSTEMI/PCI: LCX 52m  (4.0x23 Xience DES), RCA 61m, EF > 55%;  b. 07/2013 Echo: EF 60-65%, diast dysfxn, mild LVH, mildly dil LA, nl RV size/fxn, nl RVSP.   Diabetes mellitus without complication (HCC)    History of tobacco abuse    a. quit ~ 2010   Hyperlipidemia    Morbid obesity (HCC)    a. weighed 168 @ age 88.   Scoliosis    Stroke [facility])    Past Surgical History:  Procedure Laterality Date   ADENOIDECTOMY     CARDIAC CATHETERIZATION  07/23/2013   [facility]'x1 stent   COLONOSCOPY N/A 09/05/2022   Procedure: COLONOSCOPY;  Surgeon: Onita Elspeth Sharper, DO;  Location: [facility] ENDOSCOPY;  Service: Gastroenterology;  Laterality: N/A;   CORONARY ANGIOPLASTY     with stent   Social History:  reports that he quit smoking about 15 years ago. His smoking use included cigarettes. He has never used smokeless tobacco. He reports that he does not currently use alcohol. He reports that he does not currently use drugs after having used the following drugs: Marijuana.  No Known Allergies  Family History  Problem Relation Age of Onset   Heart disease Mother    Esophageal cancer Father    Diabetes Sister     Prior to Admission medications   Medication Sig Start Date End Date Taking? Authorizing Provider  apixaban  (ELIQUIS ) 2.5 MG TABS tablet TAKE 1 TABLET BY MOUTH 2 TIMES DAILY. 08/04/23   Gollan, Timothy J, MD  atorvastatin  (LIPITOR ) 80 MG tablet Take 1 tablet (80  mg total) by mouth daily. 01/21/23   Gollan, Timothy J, MD  carvedilol  (COREG ) 25 MG tablet TAKE 1 TABLET (25 MG TOTAL) BY MOUTH TWICE A DAY WITH MEALS 03/10/23   Gollan, Timothy J, MD  glimepiride  (AMARYL ) 2 MG tablet Take 2 mg by mouth 2 (two) times daily.    [provider]  Lancets (UNISTIK 2 NORMAL) MISC 1 each by Other route. 07/30/21   [provider]  lisinopril  (ZESTRIL ) 20 MG tablet Take 1 tablet (20 mg total) by mouth daily. Patient not taking: Reported on 09/12/2023 07/28/23 10/26/23  Gollan, Timothy J, MD  metFORMIN  (GLUCOPHAGE -XR) 750  MG 24 hr tablet Take 750 mg by mouth daily with breakfast. 08/22/23 08/21/24  [provider]  mirtazapine  (REMERON ) 30 MG tablet Take 1 tablet (30 mg total) by mouth at bedtime. Patient not taking: Reported on 07/15/2023 04/27/21   Cuthriell, Dorn BIRCH, PA-C  PRECISION QID TEST test strip 1 strip. 08/12/23   [provider]    Physical Exam: Vitals:   02/24/24 1518 02/24/24 1535  BP: (!) 140/87   Pulse: 93   Resp: 18   Temp: 98.4 F (36.9 C)   TempSrc: Oral   SpO2: 100%   Weight: 77.1 kg 77.1 kg  Height: 5' 6 (1.676 m) 5' 6 (1.676 m)   Physical Exam Vitals and nursing note reviewed.  Constitutional:      General: He is not in acute distress.    Appearance: He is not ill-appearing or toxic-appearing.  HENT:     Head: Normocephalic and atraumatic.  Eyes:     General: No scleral icterus. Cardiovascular:     Rate and Rhythm: Normal rate and regular rhythm.  Abdominal:     Palpations: Abdomen is soft.     Tenderness: There is no abdominal tenderness.  Musculoskeletal:     Cervical back: Neck supple.     Right lower leg: Edema present.     Left lower leg: Edema present.  Skin:    General: Skin is warm and dry.     Capillary Refill: Capillary refill takes less than 2 seconds.  Neurological:     General: No focal deficit present.     Mental Status: He is alert and oriented to person, place, and time.  Psychiatric:        Mood and Affect: Affect is flat.     Data Reviewed:   Labs on Admission: I have personally reviewed following labs and imaging studies  CBC: Recent Labs  Lab 02/24/24 1537  WBC 5.8  NEUTROABS 2.8  HGB 11.5*  HCT 33.2*  MCV 106.1*  PLT 310   Basic Metabolic Panel: Recent Labs  Lab 02/24/24 1537  NA 143  K 2.4*  CL 96*  CO2 35*  GLUCOSE 163*  BUN 6  CREATININE 0.69  CALCIUM  7.1*  MG 1.3*   GFR: Estimated Creatinine Clearance: 103.2 mL/min (by C-G formula based on SCr of 0.69 mg/dL). Liver Function Tests: Recent  Labs  Lab 02/24/24 1537  AST 81*  ALT 19  ALKPHOS 151*  BILITOT 2.6*  PROT 6.3*  ALBUMIN 3.3*   No results for input(s): LIPASE, AMYLASE in the last 168 hours. No results for input(s): AMMONIA in the last 168 hours. Coagulation Profile: No results for input(s): INR, PROTIME in the last 168 hours. Cardiac Enzymes: No results for input(s): CKTOTAL, CKMB, CKMBINDEX, TROPONINI in the last 168 hours. BNP (last 3 results) No results for input(s): PROBNP in the last 8760 hours.  HbA1C: No results for input(s): HGBA1C in the last 72 hours. CBG: No results for input(s): GLUCAP in the last 168 hours. Lipid Profile: No results for input(s): CHOL, HDL, LDLCALC, TRIG, CHOLHDL, LDLDIRECT in the last 72 hours. Thyroid  Function Tests: No results for input(s): TSH, T4TOTAL, FREET4, T3FREE, THYROIDAB in the last 72 hours. Anemia Panel: No results for input(s): VITAMINB12, FOLATE, FERRITIN, TIBC, IRON , RETICCTPCT in the last 72 hours. Urine analysis:    Component Value Date/Time   COLORURINE YELLOW (A) 08/19/2019 2222   APPEARANCEUR Clear 10/20/2019 1239   LABSPEC 1.032 (H) 08/19/2019 2222   LABSPEC 1.016 08/10/2013 1510   PHURINE 5.0 08/19/2019 2222   GLUCOSEU CANCELED 12/08/2019 1147   GLUCOSEU >=500 08/10/2013 1510   HGBUR NEGATIVE 08/19/2019 2222   BILIRUBINUR Negative 10/20/2019 1239   BILIRUBINUR Negative 08/10/2013 1510   KETONESUR 20 (A) 08/19/2019 2222   PROTEINUR CANCELED 12/08/2019 1147   PROTEINUR NEGATIVE 08/19/2019 2222   NITRITE Negative 10/20/2019 1239   NITRITE NEGATIVE 08/19/2019 2222   LEUKOCYTESUR Negative 10/20/2019 1239   LEUKOCYTESUR NEGATIVE 08/19/2019 2222   LEUKOCYTESUR Negative 08/10/2013 1510    Radiological Exams on Admission: No results found.     Assessment and Plan:  Hypokalemia  Hypomagnesemia  Qt prolongation  - in the setting of decreased PO intake, ETOH use, recent diarrhea. K 2.4,  mag 1.3 - monitor on telemetry overnight  - Kcl, 4mg  Mag ordered  - start daily calcium  citrate - repeat labs and EKG in am   Transaminitis  ETOH abuse  - reports 2 drinks daily, no hx withdrawals. Currently no evidence of withdrawals. Monitor for now  - chronically elevated. Abdominal exam benign. F/u PCP   T2DM  - glucose 163 here - cont glimepiride , +SSI, consistent carb diet    CAD s/p PCI with DES to left circumflex (2015) HTN - stable, CP free.  -  home carvedilol  resumed.   Hx DVT on eliuqis  - eliquis  resumed    Advance Care Planning:   Code Status: Full Code discussed with patient of admission   Severity of Illness: The appropriate patient status for this patient is OBSERVATION. Observation status is judged to be reasonable and necessary in order to provide the required intensity of service to ensure the patient's safety. The patient's presenting symptoms, physical exam findings, and initial radiographic and laboratory data in the context of their medical condition is felt to place them at decreased risk for further clinical deterioration. Furthermore, it is anticipated that the patient will be medically stable for discharge from the hospital within 2 midnights of admission.   Author: Daved JAYSON Pump, DO 02/24/2024 6:57 PM  For on call review [email or web address].

## 2024-02-24 NOTE — ED Provider Notes (Signed)
 [facility] Provider Note    Event Date/Time   First MD Initiated Contact with Patient 02/24/24 1639     (approximate)   History   Abnormal Lab   HPI  Christian [name] is a 53 y.o. male past medical history significant for diabetes, CAD, hypertension, polycythemia, presents to the emergency department with multiple lab abnormalities.  Patient was told by his primary care physician to come to the emergency department.  States that he was having multiple episodes of diarrhea but has not had a bowel movement since Wednesday whenever he started taking Imodium.  States that he was having 8-10 episodes of diarrhea a day that was nonbloody.  No recent antibiotic use and no history of C. difficile.  Does state that he has had some dizziness.  Denies any numbness or tingling.  No abdominal pain or nausea and vomiting.     Physical Exam   Triage Vital Signs: ED Triage Vitals  Encounter Vitals Group     BP 02/24/24 1518 (!) 140/87     Girls Systolic BP Percentile --      Girls Diastolic BP Percentile --      Boys Systolic BP Percentile --      Boys Diastolic BP Percentile --      Pulse Rate 02/24/24 1518 93     Resp 02/24/24 1518 18     Temp 02/24/24 1518 98.4 F (36.9 C)     Temp Source 02/24/24 1518 Oral     SpO2 02/24/24 1518 100 %     Weight 02/24/24 1518 170 lb (77.1 kg)     Height 02/24/24 1518 5' 6 (1.676 m)     Head Circumference --      Peak Flow --      Pain Score 02/24/24 1535 0     Pain Loc --      Pain Education --      Exclude from Growth Chart --     Most recent vital signs: Vitals:   02/24/24 1518  BP: (!) 140/87  Pulse: 93  Resp: 18  Temp: 98.4 F (36.9 C)  SpO2: 100%    Physical Exam Constitutional:      Appearance: He is well-developed.  HENT:     Head: Atraumatic.  Eyes:     Conjunctiva/sclera: Conjunctivae normal.  Cardiovascular:     Rate and Rhythm: Regular rhythm.  Pulmonary:     Effort: No respiratory distress.   Abdominal:     Palpations: Abdomen is soft.     Tenderness: There is no abdominal tenderness.  Musculoskeletal:        General: Normal range of motion.     Cervical back: Normal range of motion.  Skin:    General: Skin is warm.     Capillary Refill: Capillary refill takes less than 2 seconds.  Neurological:     Mental Status: He is alert. Mental status is at baseline.     IMPRESSION / MDM / ASSESSMENT AND PLAN / ED COURSE  I reviewed the triage vital signs and the nursing notes.  Plan to repeat lab work  Differential diagnosis including electrolyte abnormality, dehydration, C. difficile, infectious diarrhea   EKG  I, Clotilda Punter, the attending physician, personally viewed and interpreted this ECG.  EKG with normal sinus rhythm.  Narrow complex.  QTc prolonged at 523.  Nonspecific ST changes  =LABS (all labs ordered are listed, but only abnormal results are displayed) Labs interpreted as -  Labs Reviewed  CBC WITH DIFFERENTIAL/PLATELET - Abnormal; Notable for the following components:      Result Value   RBC 3.13 (*)    Hemoglobin 11.5 (*)    HCT 33.2 (*)    MCV 106.1 (*)    MCH 36.7 (*)    RDW 17.9 (*)    nRBC 0.3 (*)    All other components within normal limits  COMPREHENSIVE METABOLIC PANEL WITH GFR - Abnormal; Notable for the following components:   Potassium 2.4 (*)    Chloride 96 (*)    CO2 35 (*)    Glucose, Bld 163 (*)    Calcium  7.1 (*)    Total Protein 6.3 (*)    Albumin 3.3 (*)    AST 81 (*)    Alkaline Phosphatase 151 (*)    Total Bilirubin 2.6 (*)    All other components within normal limits  MAGNESIUM  - Abnormal; Notable for the following components:   Magnesium  1.3 (*)    All other components within normal limits     MDM  Patient's potassium significantly low at 2.4 and magnesium  level significantly low at 1.3 likely in the setting of diarrhea.  Creatinine appears to be at his baseline.  Calcium  mildly low at 7.1.  Abdomen is  nontender to palpation.  T. bili is chronically elevated at 2.6.  Have a very low suspicion for acute cholecystitis or symptomatic cholelithiasis, no abdominal pain or nausea and vomiting and no abdominal tenderness to palpation  Patient with multiple lab work abnormalities and prolonged QTc.  Given IV fluids, potassium and magnesium  replacement.  Consulted hospitalist for electrolyte abnormality and prolonged QT interval.  Low suspicion for C. difficile given no further episodes of diarrhea since Wednesday.     PROCEDURES:  Critical Care performed: yes  .Critical Care  Performed by: Suzanne Kirsch, MD Authorized by: Suzanne Kirsch, MD   Critical care provider statement:    Critical care time (minutes):  20   Critical care time was exclusive of:  Separately billable procedures and treating other patients   Critical care was necessary to treat or prevent imminent or life-threatening deterioration of the following conditions:  Metabolic crisis   Critical care was time spent personally by me on the following activities:  Development of treatment plan with patient or surrogate, discussions with consultants, evaluation of patient's response to treatment, examination of patient, ordering and review of laboratory studies, ordering and review of radiographic studies, ordering and performing treatments and interventions, pulse oximetry, re-evaluation of patient's condition and review of old charts   Patient's presentation is most consistent with acute presentation with potential threat to life or bodily function.   MEDICATIONS ORDERED IN ED: Medications  potassium chloride  10 mEq in 100 mL IVPB (has no administration in time range)  sodium chloride  0.9 % bolus 1,000 mL (has no administration in time range)  potassium chloride  (KLOR-CON ) packet 40 mEq (has no administration in time range)  magnesium  sulfate IVPB 2 g 50 mL (has no administration in time range)    FINAL CLINICAL IMPRESSION(S) /  ED DIAGNOSES   Final diagnoses:  Hypokalemia  Hypomagnesemia  Prolonged Q-T interval on ECG  Hypocalcemia     Rx / DC Orders   ED Discharge Orders     None        Note:  This document was prepared using Dragon voice recognition software and may include unintentional dictation errors.   Suzanne Kirsch, MD 02/24/24 1659

## 2024-02-24 NOTE — ED Notes (Signed)
 Eating dinner tray. No discomfort or distress noted. Call light within reach safety precautions in place. Awaiting med from pharmacy

## 2024-02-24 NOTE — Telephone Encounter (Signed)
Please contact pt for future appointment. Pt overdue for follow up.

## 2024-02-24 NOTE — ED Notes (Signed)
 Called CCMD to place pt on cardiac monitoring

## 2024-02-24 NOTE — ED Triage Notes (Signed)
 Pt sent by PCP for low potassium and calcium . Pt had labs drawn on 11/26 and K was 2.8 and Ca 6.9. Labs redrawn this morning and K 2.3 and Ca 7.. Pt c/o dizziness and reports falling 10 days ago. Pt on blood thinner.

## 2024-02-25 ENCOUNTER — Encounter: Payer: Self-pay | Admitting: Emergency Medicine

## 2024-02-25 DIAGNOSIS — E876 Hypokalemia: Secondary | ICD-10-CM | POA: Diagnosis not present

## 2024-02-25 DIAGNOSIS — I509 Heart failure, unspecified: Secondary | ICD-10-CM | POA: Diagnosis not present

## 2024-02-25 LAB — RETICULOCYTES
Immature Retic Fract: 21.8 % — ABNORMAL HIGH (ref 2.3–15.9)
RBC.: 2.34 MIL/uL — ABNORMAL LOW (ref 4.22–5.81)
Retic Count, Absolute: 88.2 K/uL (ref 19.0–186.0)
Retic Ct Pct: 3.8 % — ABNORMAL HIGH (ref 0.4–3.1)

## 2024-02-25 LAB — IRON AND TIBC
Iron: 125 ug/dL (ref 45–182)
Saturation Ratios: 88 % — ABNORMAL HIGH (ref 17.9–39.5)
TIBC: 143 ug/dL — ABNORMAL LOW (ref 250–450)
UIBC: 18 ug/dL

## 2024-02-25 LAB — CBC
HCT: 24.9 % — ABNORMAL LOW (ref 39.0–52.0)
Hemoglobin: 8.8 g/dL — ABNORMAL LOW (ref 13.0–17.0)
MCH: 37.8 pg — ABNORMAL HIGH (ref 26.0–34.0)
MCHC: 35.3 g/dL (ref 30.0–36.0)
MCV: 106.9 fL — ABNORMAL HIGH (ref 80.0–100.0)
Platelets: 203 K/uL (ref 150–400)
RBC: 2.33 MIL/uL — ABNORMAL LOW (ref 4.22–5.81)
RDW: 18.1 % — ABNORMAL HIGH (ref 11.5–15.5)
WBC: 4.6 K/uL (ref 4.0–10.5)
nRBC: 0 % (ref 0.0–0.2)

## 2024-02-25 LAB — COMPREHENSIVE METABOLIC PANEL WITH GFR
ALT: 16 U/L (ref 0–44)
AST: 62 U/L — ABNORMAL HIGH (ref 15–41)
Albumin: 2.6 g/dL — ABNORMAL LOW (ref 3.5–5.0)
Alkaline Phosphatase: 115 U/L (ref 38–126)
Anion gap: 7 (ref 5–15)
BUN: 5 mg/dL — ABNORMAL LOW (ref 6–20)
CO2: 35 mmol/L — ABNORMAL HIGH (ref 22–32)
Calcium: 6.6 mg/dL — ABNORMAL LOW (ref 8.9–10.3)
Chloride: 102 mmol/L (ref 98–111)
Creatinine, Ser: 0.59 mg/dL — ABNORMAL LOW (ref 0.61–1.24)
GFR, Estimated: >60 mL/min (ref >60)
Glucose, Bld: 114 mg/dL — ABNORMAL HIGH (ref 70–99)
Potassium: 2.4 mmol/L — CL (ref 3.5–5.1)
Sodium: 144 mmol/L (ref 135–145)
Total Bilirubin: 1.7 mg/dL — ABNORMAL HIGH (ref 0.0–1.2)
Total Protein: 4.7 g/dL — ABNORMAL LOW (ref 6.5–8.1)

## 2024-02-25 LAB — FOLATE: Folate: 4.2 ng/mL — ABNORMAL LOW (ref >5.9)

## 2024-02-25 LAB — GLUCOSE, CAPILLARY
Glucose-Capillary: 173 mg/dL — ABNORMAL HIGH (ref 70–99)
Glucose-Capillary: 163 mg/dL — ABNORMAL HIGH (ref 70–99)
Glucose-Capillary: 128 mg/dL — ABNORMAL HIGH (ref 70–99)

## 2024-02-25 LAB — HEMOGLOBIN A1C
Hgb A1c MFr Bld: 5.6 % (ref 4.8–5.6)
Mean Plasma Glucose: 114 mg/dL

## 2024-02-25 LAB — HIV ANTIBODY (ROUTINE TESTING W REFLEX): HIV Screen 4th Generation wRfx: NONREACTIVE

## 2024-02-25 LAB — FERRITIN: Ferritin: 902 ng/mL — ABNORMAL HIGH (ref 24–336)

## 2024-02-25 LAB — PHOSPHORUS: Phosphorus: 3.2 mg/dL (ref 2.5–4.6)

## 2024-02-25 LAB — CBG MONITORING, ED: Glucose-Capillary: 106 mg/dL — ABNORMAL HIGH (ref 70–99)

## 2024-02-25 LAB — BILIRUBIN, DIRECT: Bilirubin, Direct: 1.2 mg/dL — ABNORMAL HIGH (ref 0.0–0.2)

## 2024-02-25 LAB — VITAMIN B12: Vitamin B-12: 344 pg/mL (ref 180–914)

## 2024-02-25 LAB — POTASSIUM: Potassium: 3.1 mmol/L — ABNORMAL LOW (ref 3.5–5.1)

## 2024-02-25 LAB — MAGNESIUM: Magnesium: 1.8 mg/dL (ref 1.7–2.4)

## 2024-02-25 MED ORDER — POTASSIUM CHLORIDE CRYS ER 20 MEQ PO TBCR
40.0000 meq | EXTENDED_RELEASE_TABLET | ORAL | Status: AC
  Administered 2024-02-25 (×2): 40 meq via ORAL
  Filled 2024-02-25 (×2): qty 2

## 2024-02-25 MED ORDER — CALCIUM GLUCONATE-NACL 2-0.675 GM/100ML-% IV SOLN
2.0000 g | Freq: Once | INTRAVENOUS | Status: AC
  Administered 2024-02-25: 2000 mg via INTRAVENOUS
  Filled 2024-02-25: qty 100

## 2024-02-25 MED ORDER — THIAMINE MONONITRATE 100 MG PO TABS
100.0000 mg | ORAL_TABLET | Freq: Every day | ORAL | Status: DC
  Administered 2024-02-25 – 2024-02-27 (×3): 100 mg via ORAL
  Filled 2024-02-25 (×3): qty 1

## 2024-02-25 MED ORDER — LORAZEPAM 1 MG PO TABS: 1.0000 mg | ORAL_TABLET | ORAL | Status: DC | PRN

## 2024-02-25 MED ORDER — ENSURE PLUS HIGH PROTEIN PO LIQD
237.0000 mL | Freq: Two times a day (BID) | ORAL | Status: DC
  Administered 2024-02-25 – 2024-02-26 (×3): 237 mL via ORAL

## 2024-02-25 MED ORDER — FOLIC ACID 1 MG PO TABS
1.0000 mg | ORAL_TABLET | Freq: Every day | ORAL | Status: DC
  Administered 2024-02-25 – 2024-02-27 (×3): 1 mg via ORAL
  Filled 2024-02-25 (×3): qty 1

## 2024-02-25 MED ORDER — ADULT MULTIVITAMIN W/MINERALS CH
1.0000 | ORAL_TABLET | Freq: Every day | ORAL | Status: DC
  Administered 2024-02-25 – 2024-02-27 (×3): 1 via ORAL
  Filled 2024-02-25 (×3): qty 1

## 2024-02-25 MED ORDER — LORAZEPAM 2 MG/ML IJ SOLN: 1.0000 mg | INTRAMUSCULAR | Status: DC | PRN

## 2024-02-25 NOTE — Plan of Care (Signed)

## 2024-02-25 NOTE — ED Notes (Signed)
 Pt given breakfast tray

## 2024-02-25 NOTE — ED Notes (Signed)
 NURSE SAM RN INFORMED OF ASSIGNED BED

## 2024-02-25 NOTE — ED Notes (Signed)
 Pharmacy messaged regarding missing meds

## 2024-02-25 NOTE — Progress Notes (Signed)
 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  On arrival, patient had no complaints.  He reported some diarrhea several weeks ago but denied having bowel movement since taking Imodium 6 days ago.  He reported no vomiting or diuretic use.  He did report decreased p.o. intake due to loss of appetite and lower extremity swelling.  He also reported drinking 2 alcoholic drinks at night, denies history of withdrawals.  In the ED, he was afebrile with a temp of 98.4 F, RR 18, HR 93, BP 140/87, SpO2 100% on RA.  CBC showed Hgb 11.5, MCV 106.1. CMP was remarkable for potassium of 2.4, bicarb 35, calcium  7.1 (corrected to 7.7), AST 81, ALT 19, alk phos 151, T. bili 2.6.  Magnesium  1.3. EKG showed sinus rhythm with PACs, QTc 523 ms.  In the ED, he received 1 L IVF, 2 g of magnesium , and 50 mg potassium.  He was admitted for hypokalemia and hypomagnesemia.  Assessment and Plan:  #Hypokalemia - K 2.4 on admission - K 2.4 today - PO KCl 40 mEq x 2 --> K improved to 3.1 - Ordered another PO KCl 40 mEq x 2  #Hypocalcemia - Ca corrected to 7.7 - Ordered IV calcium  gluconate 2g once - Continue daily calcium  citrate - Vit D level ordered  # Hypomagnesemia - resolved - Mg 1.3 on admission - Repleted  # Lower extremity swelling - Patient reports 2-3 weeks of lower extremity swelling, no known history of CHF, not on home diuretics, denies exertional dyspnea or orthopnea - Possibly 3rd spacing in the setting of low albumin given  - Echo ordered to rule out CHF  # Diarrhea - Initially had  diarrhea around 1.5 weeks ago for several days which ultimately resolved with imodium and reportedly had no BM for 6 days - Now reports 5 episodes of loose watery stools over last couple of hours - GIPP ordered. No history of recent antibiotic use, will hold off on ordering C diff.  #QTc prolongation - QTc prolonged to 523 ms on admission - Possibly secondary to electrolyte derangements - Will repeat EKG after addressing electrolytes as above  # Macrocytic anemia - Hgb 11.5, MCV 106 on admission - Hgb dropped to 8.8 today, however, may be partially hemodilutional given IVF resuscitation iso dehydration 2/2 diarrhea on admission supported by drop in all cell lines and albumin drop from 3.3-2.6  - No overt signs/symptoms of bleeding - Anemia panel ordered  # Chronic alcohol use disorder - Reports drinking 4-6 shots of vodka per night - No history of withdrawals when abstains from drinking for 1-2 days - Started CIWA protocol - Thiamine , folate, multivitamin  # Transaminitis # Hyperbilirubinemia # Likely alcohol-related liver injury - T. Bili 2.6, alk phos 151, AST 81, ALT 19 (~ 4:1) on admission - Improved with abstinence and fluids - T. Bili 1.7, alk phos 115 (resolved), AST 62, ALT 16  # T2DM - Hgb A1c 5.6 indicating adequate control - Home Amaryl  held - Continue SSI  # CAD s/p PCI with DEC to LCx (2015) - Stable,  no chest pain - Continue home Coreg   # Hx of DVT  - Continue home Eliquis   DVT prophylaxis: apixaban  (ELIQUIS ) tablet 2.5 mg Start: 02/24/24 2200 apixaban  (ELIQUIS ) tablet 2.5 mg   Code Status:   Code Status: Full Code  Family Communication: None  Disposition Plan: Home pending clinical improvement and resolution of electrolyte abnormalities   Level of care: Telemetry  Consultants:  None  Procedures:  None  Antimicrobials: None   Subjective: Patient examined at bedside. Reports no chest pain, shortness of breath, abdominal pain, nausea,  vomiting. Now having recurrence of diarrhea, about 5 episodes of loose watery stools over last hour or so. Acknowledges drinking 4 to 6 alcoholic drinks nightly, mostly shots of vodka. Never had issues with withdrawal when abstaining for a few days. His lower extremity swelling is new, started about 2-3 weeks ago. Denies orthopnea or history of CHF. Does not use diuretics.   Objective: Vitals:   02/25/24 0600 02/25/24 0601 02/25/24 0630 02/25/24 0700  BP: 136/87  (!) 149/94 122/89  Pulse: 80  (!) 101 75  Resp:   17   Temp:  98.5 F (36.9 C)    TempSrc:  Oral    SpO2: 97%  (!) 84% 91%  Weight:      Height:        Intake/Output Summary (Last 24 hours) at 02/25/2024 0859 Last data filed at 02/25/2024 0803 Gross per 24 hour  Intake --  Output 175 ml  Net -175 ml   Filed Weights   02/24/24 1518 02/24/24 1535  Weight: 77.1 kg 77.1 kg    Examination:  Gen: NAD, A&Ox3 HEENT: NCAT, EOMI Neck: Supple, no JVD CV: RRR, no murmurs Resp: normal WOB, CTAB, no w/r/r Abd: Soft, NTND, no guarding Ext: 2+ pitting LE edema to mid shins Skin: Warm, dry, no rashes/lesions Neuro: CN II-XII grossly intact Psych: Calm, cooperative, appropriate affect   Data Reviewed: I have personally reviewed following labs and imaging studies  CBC: Recent Labs  Lab 02/24/24 1537 02/25/24 0540  WBC 5.8 4.6  NEUTROABS 2.8  --   HGB 11.5* 8.8*  HCT 33.2* 24.9*  MCV 106.1* 106.9*  PLT 310 203   Basic Metabolic Panel: Recent Labs  Lab 02/24/24 1537 02/25/24 0540  NA 143 144  K 2.4* 2.4*  CL 96* 102  CO2 35* 35*  GLUCOSE 163* 114*  BUN 6 5*  CREATININE 0.69 0.59*  CALCIUM  7.1* 6.6*  MG 1.3* 1.8   GFR: Estimated Creatinine Clearance: 103.2 mL/min (A) (by C-G formula based on SCr of 0.59 mg/dL (L)). Liver Function Tests: Recent Labs  Lab 02/24/24 1537 02/25/24 0540  AST 81* 62*  ALT 19 16  ALKPHOS 151* 115  BILITOT 2.6* 1.7*  PROT 6.3* 4.7*  ALBUMIN 3.3* 2.6*   No results for  input(s): LIPASE, AMYLASE in the last 168 hours. No results for input(s): AMMONIA in the last 168 hours. Coagulation Profile: No results for input(s): INR, PROTIME in the last 168 hours. Cardiac Enzymes: No results for input(s): CKTOTAL, CKMB, CKMBINDEX, TROPONINI in the last 168 hours. BNP (last 3 results) No results for input(s): PROBNP in the last 8760 hours. HbA1C: No results for input(s): HGBA1C in the last 72 hours. CBG: Recent Labs  Lab 02/24/24 2105 02/25/24 0757  GLUCAP 142* 106*   Lipid Profile: No results for input(s): CHOL, HDL, LDLCALC, TRIG, CHOLHDL, LDLDIRECT in the last 72 hours. Thyroid  Function Tests: No results for input(s): TSH, T4TOTAL, FREET4, T3FREE, THYROIDAB in the  last 72 hours. Anemia Panel: No results for input(s): VITAMINB12, FOLATE, FERRITIN, TIBC, IRON , RETICCTPCT in the last 72 hours. Sepsis Labs: No results for input(s): PROCALCITON, LATICACIDVEN in the last 168 hours.  No results found for this or any previous visit (from the past 240 hours).   Radiology Studies: No results found.  Scheduled Meds:  apixaban   2.5 mg Oral BID   calcium  citrate  200 mg of elemental calcium  Oral Daily   carvedilol   25 mg Oral BID WC   glimepiride   2 mg Oral BID   insulin  aspart  0-5 Units Subcutaneous QHS   insulin  aspart  0-9 Units Subcutaneous TID WC   Continuous Infusions:   Unresulted Labs (From admission, onward)     Start     Ordered   02/24/24 1856  HIV Antibody (routine testing w rflx)  (HIV Antibody (Routine testing w reflex) panel)  Once,   R        02/24/24 1857   02/24/24 1856  Hemoglobin A1c  (Glycemic Control (SSI)  Q 4 Hours / Glycemic Control (SSI)  AC +/- HS)  Once,   R       Comments: To assess prior glycemic control    02/24/24 1857             LOS:  LOS: 0 days   Time Spent: 40 minutes  Christian Ozment Al-Sultani, MD [facility]  If 7PM-7AM, please contact  night-coverage  02/25/2024, 8:59 AM

## 2024-02-25 NOTE — TOC CM/SW Note (Signed)
 Transition of Care [facility]) CM/SW Note  Transition of Care [facility]) - Inpatient Brief Assessment   Patient Details  Name: Christian [name] MRN: [id number] Date of Birth: 02/16/1971  [facility]) CM/SW Contact:    Alvaro Louder, LCSW Phone Number: 02/25/2024, 11:47 AM   Clinical Narrative:  Per consult smoke cessation information placed in AVS. No other TOC needs at this time. Please consult if they arise.  TOC to follow for discharge  Transition of Care Asessment: Insurance and Status: Insurance coverage has been reviewed Patient has primary care physician: Yes Home environment has been reviewed: Single family home Prior level of function:: Ox4 Prior/Current Home Services: No current home services Social Drivers of Health Review: SDOH reviewed needs interventions Readmission risk has been reviewed: Yes Transition of care needs: transition of care needs identified, TOC will continue to follow

## 2024-02-26 ENCOUNTER — Inpatient Hospital Stay: Admit: 2024-02-26 | Discharge: 2024-02-26 | Disposition: A

## 2024-02-26 DIAGNOSIS — I509 Heart failure, unspecified: Secondary | ICD-10-CM

## 2024-02-26 DIAGNOSIS — E440 Moderate protein-calorie malnutrition: Secondary | ICD-10-CM | POA: Insufficient documentation

## 2024-02-26 DIAGNOSIS — E876 Hypokalemia: Secondary | ICD-10-CM | POA: Diagnosis not present

## 2024-02-26 LAB — COMPREHENSIVE METABOLIC PANEL WITH GFR
ALT: 17 U/L (ref 0–44)
AST: 71 U/L — ABNORMAL HIGH (ref 15–41)
Albumin: 2.6 g/dL — ABNORMAL LOW (ref 3.5–5.0)
Alkaline Phosphatase: 107 U/L (ref 38–126)
Anion gap: 6 (ref 5–15)
BUN: <5 mg/dL — ABNORMAL LOW (ref 6–20)
CO2: 32 mmol/L (ref 22–32)
Calcium: 7.4 mg/dL — ABNORMAL LOW (ref 8.9–10.3)
Chloride: 105 mmol/L (ref 98–111)
Creatinine, Ser: 0.45 mg/dL — ABNORMAL LOW (ref 0.61–1.24)
GFR, Estimated: >60 mL/min (ref >60)
Glucose, Bld: 112 mg/dL — ABNORMAL HIGH (ref 70–99)
Potassium: 3.4 mmol/L — ABNORMAL LOW (ref 3.5–5.1)
Sodium: 143 mmol/L (ref 135–145)
Total Bilirubin: 1.5 mg/dL — ABNORMAL HIGH (ref 0.0–1.2)
Total Protein: 4.5 g/dL — ABNORMAL LOW (ref 6.5–8.1)

## 2024-02-26 LAB — GASTROINTESTINAL PANEL BY PCR, STOOL (REPLACES STOOL CULTURE)

## 2024-02-26 LAB — GLUCOSE, CAPILLARY
Glucose-Capillary: 108 mg/dL — ABNORMAL HIGH (ref 70–99)
Glucose-Capillary: 169 mg/dL — ABNORMAL HIGH (ref 70–99)
Glucose-Capillary: 98 mg/dL (ref 70–99)
Glucose-Capillary: 147 mg/dL — ABNORMAL HIGH (ref 70–99)

## 2024-02-26 LAB — CBC
HCT: 24.1 % — ABNORMAL LOW (ref 39.0–52.0)
Hemoglobin: 8.5 g/dL — ABNORMAL LOW (ref 13.0–17.0)
MCH: 38.1 pg — ABNORMAL HIGH (ref 26.0–34.0)
MCHC: 35.3 g/dL (ref 30.0–36.0)
MCV: 108.1 fL — ABNORMAL HIGH (ref 80.0–100.0)
Platelets: 192 K/uL (ref 150–400)
RBC: 2.23 MIL/uL — ABNORMAL LOW (ref 4.22–5.81)
RDW: 18.2 % — ABNORMAL HIGH (ref 11.5–15.5)
WBC: 4.5 K/uL (ref 4.0–10.5)
nRBC: 0.4 % — ABNORMAL HIGH (ref 0.0–0.2)

## 2024-02-26 LAB — ECHOCARDIOGRAM COMPLETE
AR max vel: 2.01 cm2
AV Area VTI: 2.13 cm2
AV Area mean vel: 1.98 cm2
AV Mean grad: 4.3 mmHg
AV Peak grad: 7.4 mmHg
Ao pk vel: 1.36 m/s
Area-P 1/2: 3.77 cm2
Height: 66 in
MV VTI: 1.74 cm2
S' Lateral: 2.7 cm
Weight: 2720 [oz_av]

## 2024-02-26 LAB — PHOSPHORUS: Phosphorus: 2.4 mg/dL — ABNORMAL LOW (ref 2.5–4.6)

## 2024-02-26 LAB — MAGNESIUM: Magnesium: 1.7 mg/dL (ref 1.7–2.4)

## 2024-02-26 LAB — VITAMIN D 25 HYDROXY (VIT D DEFICIENCY, FRACTURES): Vit D, 25-Hydroxy: 15.4 ng/mL — ABNORMAL LOW (ref 30–100)

## 2024-02-26 MED ORDER — POTASSIUM & SODIUM PHOSPHATES 280-160-250 MG PO PACK
2.0000 | PACK | Freq: Once | ORAL | Status: AC
  Administered 2024-02-26: 2 via ORAL
  Filled 2024-02-26: qty 2

## 2024-02-26 MED ORDER — VITAMIN D 25 MCG (1000 UNIT) PO TABS
1000.0000 [IU] | ORAL_TABLET | Freq: Every day | ORAL | Status: DC
  Administered 2024-02-26 – 2024-02-27 (×2): 1000 [IU] via ORAL
  Filled 2024-02-26 (×2): qty 1

## 2024-02-26 MED ORDER — MAGNESIUM SULFATE 2 GM/50ML IV SOLN
2.0000 g | Freq: Once | INTRAVENOUS | Status: AC
  Administered 2024-02-26: 2 g via INTRAVENOUS
  Filled 2024-02-26: qty 50

## 2024-02-26 MED ORDER — POTASSIUM CHLORIDE CRYS ER 20 MEQ PO TBCR
40.0000 meq | EXTENDED_RELEASE_TABLET | Freq: Once | ORAL | Status: AC
  Administered 2024-02-26: 40 meq via ORAL
  Filled 2024-02-26: qty 2

## 2024-02-26 MED ORDER — POTASSIUM PHOSPHATES 15 MMOLE/5ML IV SOLN: 15.0000 mmol | Freq: Once | INTRAVENOUS | Status: DC

## 2024-02-26 MED ORDER — ENSURE PLUS HIGH PROTEIN PO LIQD
237.0000 mL | Freq: Three times a day (TID) | ORAL | Status: DC
  Administered 2024-02-26 – 2024-02-27 (×3): 237 mL via ORAL

## 2024-02-26 NOTE — Progress Notes (Signed)
*  PRELIMINARY RESULTS* Echocardiogram 2D Echocardiogram has been performed.  Christian [name] 02/26/2024, 1:50 PM

## 2024-02-26 NOTE — Plan of Care (Signed)
  Problem: Education: Goal: Ability to describe self-care measures that may prevent or decrease complications (Diabetes Survival Skills Education) will improve Outcome: Progressing   Problem: Coping: Goal: Ability to adjust to condition or change in health will improve Outcome: Progressing   Problem: Metabolic: Goal: Ability to maintain appropriate glucose levels will improve Outcome: Progressing   Problem: Health Behavior/Discharge Planning: Goal: Ability to manage health-related needs will improve Outcome: Progressing   Problem: Skin Integrity: Goal: Risk for impaired skin integrity will decrease Outcome: Progressing   Problem: Tissue Perfusion: Goal: Adequacy of tissue perfusion will improve Outcome: Progressing   Problem: Clinical Measurements: Goal: Ability to maintain clinical measurements within normal limits will improve Outcome: Progressing   Problem: Clinical Measurements: Goal: Will remain free from infection Outcome: Progressing   Problem: Clinical Measurements: Goal: Diagnostic test results will improve Outcome: Progressing   Problem: Clinical Measurements: Goal: Cardiovascular complication will be avoided Outcome: Progressing   Problem: Elimination: Goal: Will not experience complications related to bowel motility Outcome: Progressing

## 2024-02-26 NOTE — Progress Notes (Signed)
 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  On arrival, patient had no complaints.  He reported some diarrhea several weeks ago but denied having bowel movement since taking Imodium 6 days ago.  He reported no vomiting or diuretic use.  He did report decreased p.o. intake due to loss of appetite and lower extremity swelling.  He also reported drinking 2 alcoholic drinks at night, denies history of withdrawals.  In the ED, he was afebrile with a temp of 98.4 F, RR 18, HR 93, BP 140/87, SpO2 100% on RA.  CBC showed Hgb 11.5, MCV 106.1. CMP was remarkable for potassium of 2.4, bicarb 35, calcium  7.1 (corrected to 7.7), AST 81, ALT 19, alk phos 151, T. bili 2.6.  Magnesium  1.3. EKG showed sinus rhythm with PACs, QTc 523 ms.  In the ED, he received 1 L IVF, 2 g of magnesium , and 50 mg potassium.  He was admitted for hypokalemia and hypomagnesemia.  Assessment and Plan:  #Hypokalemia - K 2.4 on admission - K 3.4 today - PO KCl 40 mEq once  #Hypocalcemia #Vitamin D deficiency  - Received IV calcium  gluconate 2g once on 12/3 - Ca corrected to 8.5 today - Vit D 25-OH 15.4 - Continue daily calcium  citrate - Start Vit D supplementation 1,000 units daily PO  # Hypomagnesemia - resolved - Mg 1.3 on admission  # Lower extremity swelling - Patient reports 2-3 weeks of lower extremity swelling, no known history of CHF, not on home diuretics, denies exertional dyspnea or orthopnea - Most likely 3rd spacing in the setting of low albumin given  - Echo showed LVEF 60-65%, no RWMA, grade  1 diastolic function, normal RV systolic function, no valvular abnormalities.  # Moderate malnutrition iso chronic illness - RD evaluated patient, recommended continued carb modified diet, increase Ensure Plus High protein to TID, continuv MVI, folic acid , and thiamine  supplementation  # Diarrhea - resolved - Initially had diarrhea around 1.5 weeks ago for several days which ultimately resolved with imodium and reportedly had no BM for 6 days - Had 5-7 episodes of loose watery stools yesterday. None today so far - GIPP negative. No history of recent antibiotic use, will hold off on ordering C diff.  #QTc prolongation - QTc prolonged to 523 ms on admission - Possibly secondary to electrolyte derangements - Will repeat EKG after addressing electrolytes as above  # Macrocytic anemia likely alcohol-induced # Folate deficiency - Hgb 11.5, MCV 106 on admission - Hgb relatively stable at 8.5 today. Element of hemodilutional drop from admission given IVF resuscitation iso dehydration 2/2 diarrhea on admission supported by drop in all cell lines and albumin drop from 3.3-2.6  - No overt signs/symptoms of bleeding - Anemia panel showed  iron  125, TIBC 143, saturation ratio 88, ferritin 902, B12 344, folate 4.2 - Continue folic acid  1 mg PO daily  - Follow up with PCP for continued monitoring   # Chronic alcohol use disorder - Reports drinking 4-6 shots of vodka per night - No history of withdrawals when abstains from drinking for 1-2  days - CIWA protocol - Thiamine , folate, multivitamin  # Transaminitis - improving # Hyperbilirubinemia - improving # Likely alcohol-related liver injury  - T. Bili 2.6, alk phos 151, AST 81, ALT 19 (~ 4:1) on admission - Improved with abstinence and fluids - T. Bili improved to 1.5, alk phos wnl (resolved), AST 71, ALT 17  # T2DM - Hgb A1c 5.6 indicating adequate control - Home Amaryl  held - Continue SSI  # CAD s/p PCI with DEC to LCx (2015) - Stable,  no chest pain - Continue home Coreg   # Hx of DVT  - Continue home Eliquis   DVT prophylaxis: apixaban  (ELIQUIS ) tablet 2.5 mg Start: 02/24/24 2200 apixaban  (ELIQUIS ) tablet 2.5 mg   Code Status:   Code Status: Full Code  Family Communication: None  Disposition Plan: Home pending clinical improvement and resolution of electrolyte abnormalities   Level of care: Telemetry  Consultants:  None  Procedures:  None  Antimicrobials: None   Subjective: Patient examined at bedside.  Reports doing well today.  Diarrhea stopped yesterday, had more formed stools near the end of the day.  No diarrhea today.  Denies any withdrawal like symptoms, abdominal pain, nausea, vomiting.  Objective: Vitals:   02/25/24 1539 02/25/24 1926 02/26/24 0437 02/26/24 0830  BP: 127/89 (!) 131/90 123/75 (!) 130/96  Pulse: 86 88 80 79  Resp: 19 18 20 16   Temp: 98.3 F (36.8 C) 98.5 F (36.9 C) 98.4 F (36.9 C) 97.6 F (36.4 C)  TempSrc:  Oral    SpO2: 100% 98% 97% 98%  Weight:      Height:        Intake/Output Summary (Last 24 hours) at 02/26/2024 1122 Last data filed at 02/26/2024 0900 Gross per 24 hour  Intake 360 ml  Output --  Net 360 ml   Filed Weights   02/24/24 1518 02/24/24 1535  Weight: 77.1 kg 77.1 kg    Examination:  Gen: NAD, A&Ox3 HEENT: NCAT, EOMI Neck: Supple, no JVD CV: RRR, no murmurs Resp: normal WOB, CTAB, no w/r/r Abd: Soft, NTND, no guarding Ext: 2+ pitting LE edema to mid shins Skin: Warm, dry, no rashes/lesions Neuro: CN II-XII grossly intact Psych: Calm, cooperative, appropriate affect   Data Reviewed: I have personally reviewed following labs and imaging studies  CBC: Recent Labs  Lab 02/24/24 1537 02/25/24 0540 02/26/24 0653  WBC 5.8 4.6 4.5  NEUTROABS 2.8  --   --   HGB 11.5* 8.8* 8.5*  HCT 33.2* 24.9* 24.1*  MCV 106.1* 106.9* 108.1*  PLT 310 203 192   Basic Metabolic Panel: Recent Labs  Lab 02/24/24 1537 02/25/24 0540 02/25/24 0931  02/25/24 1458 02/26/24 0653  NA 143 144  --   --  143  K 2.4* 2.4*  --  3.1* 3.4*  CL 96* 102  --   --  105  CO2 35* 35*  --   --  32  GLUCOSE 163* 114*  --   --  112*  BUN 6 5*  --   --  <5*  CREATININE 0.69 0.59*  --   --  0.45*  CALCIUM  7.1* 6.6*  --   --  7.4*  MG 1.3* 1.8  --   --  1.7  PHOS  --   --  3.2  --  2.4*   GFR: Estimated Creatinine Clearance: 103.2 mL/min (A) (by C-G formula based on SCr of 0.45 mg/dL (L)). Liver Function Tests: Recent Labs  Lab 02/24/24 1537 02/25/24 0540 02/26/24  0653  AST 81* 62* 71*  ALT 19 16 17   ALKPHOS 151* 115 107  BILITOT 2.6* 1.7* 1.5*  PROT 6.3* 4.7* 4.5*  ALBUMIN 3.3* 2.6* 2.6*   No results for input(s): LIPASE, AMYLASE in the last 168 hours. No results for input(s): AMMONIA in the last 168 hours. Coagulation Profile: No results for input(s): INR, PROTIME in the last 168 hours. Cardiac Enzymes: No results for input(s): CKTOTAL, CKMB, CKMBINDEX, TROPONINI in the last 168 hours. BNP (last 3 results) No results for input(s): PROBNP in the last 8760 hours. HbA1C: Recent Labs    02/24/24 1537  HGBA1C 5.6   CBG: Recent Labs  Lab 02/25/24 0757 02/25/24 1154 02/25/24 1647 02/25/24 2038 02/26/24 0829  GLUCAP 106* 173* 163* 128* 108*   Lipid Profile: No results for input(s): CHOL, HDL, LDLCALC, TRIG, CHOLHDL, LDLDIRECT in the last 72 hours. Thyroid  Function Tests: No results for input(s): TSH, T4TOTAL, FREET4, T3FREE, THYROIDAB in the last 72 hours. Anemia Panel: Recent Labs    02/25/24 0540 02/25/24 0931 02/25/24 1225 02/25/24 1226  VITAMINB12  --   --  344  --   FOLATE  --   --   --  4.2*  FERRITIN  --  902*  --   --   TIBC  --  143*  --   --   IRON   --  125  --   --   RETICCTPCT 3.8*  --   --   --    Sepsis Labs: No results for input(s): PROCALCITON, LATICACIDVEN in the last 168 hours.  Recent Results (from the past 240 hours)  Gastrointestinal Panel by PCR ,  Stool     Status: None   Collection Time: 02/26/24 12:50 AM   Specimen: Stool  Result Value Ref Range Status   Campylobacter species NOT DETECTED NOT DETECTED Final   Plesimonas shigelloides NOT DETECTED NOT DETECTED Final   Salmonella species NOT DETECTED NOT DETECTED Final   Yersinia enterocolitica NOT DETECTED NOT DETECTED Final   Vibrio species NOT DETECTED NOT DETECTED Final   Vibrio cholerae NOT DETECTED NOT DETECTED Final   Enteroaggregative E coli (EAEC) NOT DETECTED NOT DETECTED Final   Enteropathogenic E coli (EPEC) NOT DETECTED NOT DETECTED Final   Enterotoxigenic E coli (ETEC) NOT DETECTED NOT DETECTED Final   Shiga like toxin producing E coli (STEC) NOT DETECTED NOT DETECTED Final   Shigella/Enteroinvasive E coli (EIEC) NOT DETECTED NOT DETECTED Final   Cryptosporidium NOT DETECTED NOT DETECTED Final   Cyclospora cayetanensis NOT DETECTED NOT DETECTED Final   Entamoeba histolytica NOT DETECTED NOT DETECTED Final   Giardia lamblia NOT DETECTED NOT DETECTED Final   Adenovirus F40/41 NOT DETECTED NOT DETECTED Final   Astrovirus NOT DETECTED NOT DETECTED Final   Norovirus GI/GII NOT DETECTED NOT DETECTED Final   Rotavirus A NOT DETECTED NOT DETECTED Final   Sapovirus (I, II, IV, and V) NOT DETECTED NOT DETECTED Final    Comment: Performed at [facility], [address], [place], [place] [address]     Radiology Studies: No results found.  Scheduled Meds:  apixaban   2.5 mg Oral BID   calcium  citrate  200 mg of elemental calcium  Oral Daily   carvedilol   25 mg Oral BID WC   feeding supplement  237 mL Oral BID BM   folic acid   1 mg Oral Daily   insulin  aspart  0-5 Units Subcutaneous QHS   insulin  aspart  0-9 Units Subcutaneous TID WC   multivitamin  with minerals  1 tablet Oral Daily   potassium chloride   40 mEq Oral Once   thiamine   100 mg Oral Daily   Continuous Infusions:  magnesium  sulfate bolus IVPB     potassium PHOSPHATE  IVPB (in mmol)        Unresulted Labs (From admission, onward)     Start     Ordered   02/26/24 0500  Magnesium   Daily,   R      02/25/24 0927   02/26/24 0500  Comprehensive metabolic panel with GFR  Daily,   R      02/25/24 0927   02/26/24 0500  CBC  Daily,   R      02/25/24 0927   02/25/24 0928  Phosphorus  Daily,   R      02/25/24 0927             LOS:  LOS: 1 day   Time Spent: 40 minutes  Chord Takahashi Al-Sultani, MD [facility]  If 7PM-7AM, please contact night-coverage  02/26/2024, 11:22 AM

## 2024-02-26 NOTE — Progress Notes (Signed)
*  PRELIMINARY RESULTS* Echocardiogram 2D Echocardiogram has been performed.  Floydene Harder 02/26/2024, 1:50 PM

## 2024-02-26 NOTE — Progress Notes (Addendum)
 Initial Nutrition Assessment  DOCUMENTATION CODES:   Non-severe (moderate) malnutrition in context of chronic illness  INTERVENTION:   -Continue carb modified diet -Increase Ensure Plus High Protein po TID, each supplement provides 350 kcal and 20 grams of protein  -Continue MVI with minerals daily -Feeding assistance with meals   -Continue 1 mg folic acid  daily -Continue 100 grams thiamine  daily  NUTRITION DIAGNOSIS:   Moderate Malnutrition related to chronic illness (CVA) as evidenced by mild fat depletion, moderate fat depletion, mild muscle depletion, moderate muscle depletion, edema.  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  Patient admitted with hypokalemia, hypocalcemia, hypomagnesemia, and lower extremity swelling.   Reviewed I/O's: +45 ml x 24 hours   UOP: 175 ml x 24 hours  Per MD notes, plan for echo to rule out CHF.   Patient states that he has progressively lost weight over the past year or so. He explains that he lost down to 165# after having 2 strokes (02/2019 and 07/2019) and lost weight due poor oral intake and food not tasting right. He estimates that he was 172# at PCP visit a few weeks ago. Reviewed weight history; no weight loss not over the past 6 months, however, patient with moderate edema on exam, which is likely masking true weight loss as well as fat and muscle depletions.   Per patient, he usually consumes about one meal per day, which consists of chicken bog, salmon, or  pizza. He was not eating well over the past few days due to not feeling well, but has improved. He consumed 50% of his omelette, 75% of sausage patty, and a few bites of grits this morning. Documented meal completions 10-50%.   Noted patient with functional deficits of hands related to a stroke. He is able to feed himself and perform ADLs, but sometimes has difficulty opening containers. He reports staff has been assisting him with this.   Patient with history of alcohol abuse and drinks 4-6 shots of vodka nightly. He has been started on the CIWA protocol.   Discussed importance of good meal and supplement intake to promote healing. Patient is amenable to supplements.   Medications reviewed and include vitamin D3, folic acid  and thiamine .   Lab Results  Component Value Date   HGBA1C 5.6 02/24/2024   PTA DM medications are 2 mg amryl daily and 750 mg metformin  BID.   Labs reviewed: CBGS: 106-173 (inpatient orders for glycemic control are 0-5 units insulin  aspart daily at bedtime and 0-9 units insulin  aspart TID with meals).    NUTRITION - FOCUSED PHYSICAL EXAM:  Flowsheet Row Most Recent Value  Orbital Region Mild depletion  Upper Arm Region Moderate depletion  Thoracic and Lumbar Region Moderate depletion  Buccal Region Moderate depletion  Temple Region Moderate depletion  Clavicle Bone Region Moderate depletion  Clavicle and Acromion Bone Region Moderate depletion  Scapular Bone Region Moderate depletion  Dorsal Hand Moderate depletion  Patellar Region No depletion  Anterior Thigh Region No depletion  Posterior Calf Region No depletion  Edema (RD Assessment) Moderate  Hair Reviewed  Eyes Reviewed  Mouth Reviewed  Skin Reviewed  Nails Reviewed    Diet Order:   Diet Order             Diet Carb Modified Fluid consistency: Thin; Room service appropriate? Yes  Diet effective now                   EDUCATION NEEDS:   Education needs have been addressed  Skin:  Skin  Assessment: Reviewed RN Assessment  Last BM:  02/25/24  Height:   Ht Readings from Last 1 Encounters:  02/24/24 5' 6 (1.676 m)    Weight:   Wt Readings from Last 1 Encounters:  02/24/24 77.1 kg    Ideal Body Weight:  64.5 kg  BMI:  Body mass index is 27.44 kg/m.  Estimated Nutritional Needs:   Kcal:  1900-2100  Protein:  100-115 grams  Fluid:  1.9-2.1 L    Margery ORN, RD, LDN, CDCES Registered Dietitian III Certified Diabetes Care and Education Specialist If unable to reach this RD, please use RD Inpatient group chat on secure chat between hours of 8am-4 pm daily

## 2024-02-27 DIAGNOSIS — R600 Localized edema: Secondary | ICD-10-CM | POA: Insufficient documentation

## 2024-02-27 DIAGNOSIS — E876 Hypokalemia: Secondary | ICD-10-CM | POA: Diagnosis not present

## 2024-02-27 DIAGNOSIS — F1020 Alcohol dependence, uncomplicated: Secondary | ICD-10-CM | POA: Insufficient documentation

## 2024-02-27 DIAGNOSIS — D7589 Other specified diseases of blood and blood-forming organs: Secondary | ICD-10-CM | POA: Insufficient documentation

## 2024-02-27 DIAGNOSIS — D539 Nutritional anemia, unspecified: Secondary | ICD-10-CM | POA: Insufficient documentation

## 2024-02-27 DIAGNOSIS — K709 Alcoholic liver disease, unspecified: Secondary | ICD-10-CM | POA: Insufficient documentation

## 2024-02-27 LAB — COMPREHENSIVE METABOLIC PANEL WITH GFR
ALT: 26 U/L (ref 0–44)
AST: 101 U/L — ABNORMAL HIGH (ref 15–41)
Albumin: 2.6 g/dL — ABNORMAL LOW (ref 3.5–5.0)
Alkaline Phosphatase: 113 U/L (ref 38–126)
Anion gap: 7 (ref 5–15)
BUN: <5 mg/dL — ABNORMAL LOW (ref 6–20)
CO2: 30 mmol/L (ref 22–32)
Calcium: 7.9 mg/dL — ABNORMAL LOW (ref 8.9–10.3)
Chloride: 103 mmol/L (ref 98–111)
Creatinine, Ser: 0.48 mg/dL — ABNORMAL LOW (ref 0.61–1.24)
GFR, Estimated: >60 mL/min (ref >60)
Glucose, Bld: 107 mg/dL — ABNORMAL HIGH (ref 70–99)
Potassium: 4 mmol/L (ref 3.5–5.1)
Sodium: 139 mmol/L (ref 135–145)
Total Bilirubin: 1.3 mg/dL — ABNORMAL HIGH (ref 0.0–1.2)
Total Protein: 4.8 g/dL — ABNORMAL LOW (ref 6.5–8.1)

## 2024-02-27 LAB — CBC
HCT: 24.8 % — ABNORMAL LOW (ref 39.0–52.0)
Hemoglobin: 8.5 g/dL — ABNORMAL LOW (ref 13.0–17.0)
MCH: 37.3 pg — ABNORMAL HIGH (ref 26.0–34.0)
MCHC: 34.3 g/dL (ref 30.0–36.0)
MCV: 108.8 fL — ABNORMAL HIGH (ref 80.0–100.0)
Platelets: 214 K/uL (ref 150–400)
RBC: 2.28 MIL/uL — ABNORMAL LOW (ref 4.22–5.81)
RDW: 18.2 % — ABNORMAL HIGH (ref 11.5–15.5)
WBC: 5.7 K/uL (ref 4.0–10.5)
nRBC: 0.5 % — ABNORMAL HIGH (ref 0.0–0.2)

## 2024-02-27 LAB — GLUCOSE, CAPILLARY
Glucose-Capillary: 114 mg/dL — ABNORMAL HIGH (ref 70–99)
Glucose-Capillary: 184 mg/dL — ABNORMAL HIGH (ref 70–99)

## 2024-02-27 LAB — MAGNESIUM: Magnesium: 1.9 mg/dL (ref 1.7–2.4)

## 2024-02-27 LAB — PHOSPHORUS: Phosphorus: 4 mg/dL (ref 2.5–4.6)

## 2024-02-27 MED ORDER — VITAMIN D3 25 MCG PO TABS: 1000.0000 [IU] | ORAL_TABLET | Freq: Every day | ORAL | 0 refills | Status: AC

## 2024-02-27 MED ORDER — FOLIC ACID 1 MG PO TABS: 1.0000 mg | ORAL_TABLET | Freq: Every day | ORAL | 0 refills | Status: AC

## 2024-02-27 MED ORDER — CALCIUM CITRATE 950 (200 CA) MG PO TABS: 200.0000 mg | ORAL_TABLET | Freq: Every day | ORAL | 0 refills | Status: AC

## 2024-02-27 MED ORDER — VITAMIN B-1 100 MG PO TABS: 100.0000 mg | ORAL_TABLET | Freq: Every day | ORAL | 0 refills | Status: AC

## 2024-02-27 MED ORDER — ENSURE PLUS HIGH PROTEIN PO LIQD: 237.0000 mL | Freq: Three times a day (TID) | ORAL | Status: AC

## 2024-02-27 MED ORDER — ADULT MULTIVITAMIN W/MINERALS CH: 1.0000 | ORAL_TABLET | Freq: Every day | ORAL | 0 refills | Status: AC

## 2024-02-27 NOTE — Discharge Summary (Signed)
 Physician Discharge Summary   Patient: Christian [name] MRN: [id number] DOB: [birth year]  Admit date:     02/24/2024  Discharge date: 02/27/24  Discharge Physician: Duffy Al-Sultani   PCP: Glover Lenis, MD   Recommendations at discharge:   Follow-up with PCP within 1 week of discharge.  Repeat labs including CBC, CMP, and magnesium  within 1 week.  Additionally, will need monitoring of calcium  and folate levels. Recommended alcohol cessation which patient seemed receptive to.  Continue counseling for cessation. Follow up with heme/onc in 4 weeks. Patient has follow up appointment with Dr. Melanee on 07/14/2024. Reached out to Dr. Melanee by secure chat to hopefully see if he can be seen in clinic earlier.   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  On arrival, patient had no complaints.  He reported some diarrhea several weeks ago but denied having bowel movement since taking Imodium 6 days ago.  He reported no vomiting or diuretic use.  He did report decreased p.o. intake due to loss of appetite and lower extremity swelling.  He also reported drinking 2 alcoholic drinks at night, denies history of withdrawals.   In the ED, he was afebrile with a temp of 98.4 F, RR 18, HR 93, BP 140/87, SpO2 100% on RA.  CBC showed Hgb 11.5, MCV 106.1. CMP was remarkable for potassium of 2.4, bicarb 35, calcium  7.1 (corrected to 7.7), AST 81, ALT 19,  alk phos 151, T. bili 2.6.  Magnesium  1.3. EKG showed sinus rhythm with PACs, QTc 523 ms.   In the ED, he received 1 L IVF, Mg 2 g, and KCl 50 mEq.  He was admitted for hypokalemia and hypomagnesemia.   Assessment and Plan:   #Hypokalemia - resolved #Hypophosphatemia -resolved #Hypomagnesemia -resolved - K 4.0, phosphorus 4.0, magnesium  1.9 at the time of discharge  # Macrocytic anemia likely alcohol-induced # Folate deficiency - Hgb 11.5, MCV 106 on admission - Hgb relatively stable at 8.5 today. Element of hemodilutional drop from admission given IVF resuscitation iso dehydration 2/2 diarrhea on admission supported by drop in all cell lines and albumin drop from 3.3-2.6  - No overt signs/symptoms of bleeding - Anemia panel showed  iron  125, TIBC 143, saturation ratio 88, ferritin 902, B12 344, folate 4.2 - Continue folic acid  1 mg PO daily  - Patient established with Dr. Melanee at the cancer center for a history of polycythemia with an upcoming appointment on 07/15/2023. Reached out to Dr. Melanee by secure chat and she indicated they would try to schedule the patient sooner.   #Hypocalcemia - resolved #Vitamin D  deficiency  - Received IV calcium  gluconate 2g once on 12/3 - Ca corrected to 9.0 at time of discharge - Vit D 25-OH 15.4 - Patient to continue daily calcium  citrate and PO Vit D supplementation 1,000 units    # Lower extremity swelling - Patient reports 2-3 weeks of lower extremity swelling, no known history of CHF, not  on home diuretics, denies exertional dyspnea or orthopnea - Most likely 3rd spacing in the setting of low albumin - Echo showed LVEF 60-65%, no RWMA, grade 1 diastolic function, normal RV systolic function, no valvular abnormalities. -The patient was provided TED hose, instructed to wear as much as tolerated, keep legs elevated when sitting, ambulate as much as possible - Follow up with PCP for continued management   # Moderate malnutrition iso chronic  illness - RD evaluated patient, recommended continued carb modified diet, increase Ensure Plus High protein to TID, continue MVI, folic acid , and thiamine  supplementation - Continued MVI, folic acid  and thiamine  supplementation at discharge   # Diarrhea - resolved - Initially had diarrhea around 1.5 weeks ago for several days which ultimately resolved with imodium and reportedly had no BM for 6 days - Had 5-7 episodes of loose watery stools yesterday. None today so far - GIPP negative. No history of recent antibiotic use, will hold off on ordering C diff.   #QTc prolongation - improved - QTc prolonged to 523 ms on admission - Possibly secondary to electrolyte derangements - QTc 502 at time of discharge   # Chronic alcohol use disorder - Reports drinking 4-6 shots of vodka per night - No history of withdrawals when abstains from drinking for 1-2 days - CIWA protocol while hospitalized but never needed any Ativan   - Counseled extensively on need for alcohol cessation especially in light of impact on nutritional status, anemia, and liver.  Patient appeared receptive. - Recommend continued effort by PCP for counseling on cessation   # Transaminitis - improving # Hyperbilirubinemia - improving # Likely alcohol-related liver injury  - T. Bili 2.6, alk phos 151, AST 81, ALT 19 (~ 4:1) on admission - Improved with abstinence and fluids - T. Bili improved to 1.3, alk phos wnl (resolved), AST 101, ALT 26 at the time of discharge   # T2DM - Hgb A1c 5.6 indicating adequate control - Discharged back on home regimen   # CAD s/p PCI with DEC to LCx (2015) - Stable, no chest pain - Continue home Coreg  and Eliquis    # Hx of DVT  - Continue home Eliquis     Consultants: None Procedures performed: None  Disposition: Home Diet recommendation:  Diet Orders (From admission, onward)     Start     Ordered   02/24/24 1857  Diet Carb Modified Fluid consistency: Thin; Room service appropriate? Yes   Diet effective now       Question Answer Comment  Diet-HS Snack? Nothing   Calorie Level Medium 1600-2000   Fluid consistency: Thin   Room service appropriate? Yes      02/24/24 1857            DISCHARGE MEDICATION: Allergies as of 02/27/2024   No Known Allergies      Medication List     PAUSE taking these medications    lisinopril  20 MG tablet Wait to take this until your doctor or other care provider tells you to start again. Commonly known as: ZESTRIL  Take 1 tablet (20 mg total) by mouth daily.       STOP taking these medications    metFORMIN  750 MG 24 hr tablet Commonly known as: GLUCOPHAGE -XR   mirtazapine  30 MG tablet Commonly known as: REMERON        TAKE these medications    atorvastatin  80 MG tablet Commonly known as: LIPITOR  Take 1 tablet (80 mg total) by mouth daily.   calcium  citrate 950 (  200 Ca) MG tablet Commonly known as: CALCITRATE - dosed in mg elemental calcium  Take 1 tablet (950 mg total) by mouth daily. Start taking on: February 28, 2024   carvedilol  25 MG tablet Commonly known as: COREG  TAKE 1 TABLET (25 MG TOTAL) BY MOUTH TWICE A DAY WITH MEALS   Eliquis  2.5 MG Tabs tablet Generic drug: apixaban  TAKE 1 TABLET BY MOUTH 2 TIMES DAILY.   feeding supplement Liqd Take 237 mLs by mouth 3 (three) times daily between meals.   folic acid  1 MG tablet Commonly known as: FOLVITE  Take 1 tablet (1 mg total) by mouth daily. Start taking on: February 28, 2024   glimepiride  2 MG tablet Commonly known as: AMARYL  Take 2 mg by mouth 2 (two) times daily.   multivitamin with minerals Tabs tablet Take 1 tablet by mouth daily. Start taking on: February 28, 2024   Precision QID Test test strip Generic drug: glucose blood 1 strip.   thiamine  100 MG tablet Commonly known as: Vitamin B-1 Take 1 tablet (100 mg total) by mouth daily. Start taking on: February 28, 2024   Unistik 2 Normal Misc 1 each by Other route.   vitamin D3 25 MCG  tablet Commonly known as: CHOLECALCIFEROL  Take 1 tablet (1,000 Units total) by mouth daily. Start taking on: February 28, 2024         Discharge Exam: Fredricka Weights   02/24/24 1518 02/24/24 1535  Weight: 77.1 kg 77.1 kg   Blood pressure 117/86, pulse 85, temperature 98.7 F (37.1 C), resp. rate 18, height 5' 6 (1.676 m), weight 77.1 kg, SpO2 99%.   Gen: NAD, A&Ox3 HEENT: NCAT, EOMI Neck: Supple, no JVD CV: RRR, no murmurs Resp: normal WOB, CTAB, no w/r/r Abd: Soft, NTND, no guarding Ext: 2+ pitting LE edema to mid shins Skin: Warm, dry, no rashes/lesions Neuro: CN II-XII grossly intact Psych: Calm, cooperative, appropriate affect  Condition at discharge: good  The results of significant diagnostics from this hospitalization (including imaging, microbiology, ancillary and laboratory) are listed below for reference.   Imaging Studies: ECHOCARDIOGRAM COMPLETE Result Date: 02/26/2024    ECHOCARDIOGRAM REPORT   Patient Name:   ROSAIRE CUETO Date of Exam: 02/26/2024 Medical Rec #:  [id number]    Height:       66.0 in Accession #:    [id number]   Weight:       170.0 lb Date of Birth:  [birth year]   BSA:          1.866 m Patient Age:    52 years     BP:           130/96 mmHg Patient Gender: M            HR:           79 bpm. Exam Location:  [facility] Procedure: 2D Echo, Cardiac Doppler and Color Doppler (Both Spectral and Color            Flow Doppler were utilized during procedure). Indications:     Congestive heart failure I50.9  History:         Patient has prior history of Echocardiogram examinations, most                  recent 08/22/2019. CAD, Stroke; Risk Factors:Diabetes.  Sonographer:     Christopher Furnace Referring Phys:  [id number] DUFFY LARCH Diagnosing Phys: Evalene Lunger MD IMPRESSIONS  1. Left ventricular ejection fraction, by estimation, is 60 to 65%. Left ventricular ejection  fraction by PLAX is 59 %. The left ventricle has normal function. The left ventricle has no regional wall  motion abnormalities. Left ventricular diastolic parameters are consistent with Grade I diastolic dysfunction (impaired relaxation).  2. Right ventricular systolic function is normal. The right ventricular size is normal. There is normal pulmonary artery systolic pressure. The estimated right ventricular systolic pressure is 16.2 mmHg.  3. The mitral valve is normal in structure. No evidence of mitral valve regurgitation. No evidence of mitral stenosis. The mean mitral valve gradient is 4.0 mmHg. Moderate mitral annular calcification.  4. The aortic valve is normal in structure. Aortic valve regurgitation is not visualized. Aortic valve sclerosis/calcification is present, without any evidence of aortic stenosis.  5. The inferior vena cava is normal in size with greater than 50% respiratory variability, suggesting right atrial pressure of 3 mmHg. FINDINGS  Left Ventricle: Left ventricular ejection fraction, by estimation, is 60 to 65%. Left ventricular ejection fraction by PLAX is 59 %. The left ventricle has normal function. The left ventricle has no regional wall motion abnormalities. Strain was performed and the global longitudinal strain is indeterminate. The left ventricular internal cavity size was normal in size. There is no left ventricular hypertrophy. Left ventricular diastolic parameters are consistent with Grade I diastolic dysfunction  (impaired relaxation). Right Ventricle: The right ventricular size is normal. No increase in right ventricular wall thickness. Right ventricular systolic function is normal. There is normal pulmonary artery systolic pressure. The tricuspid regurgitant velocity is 1.67 m/s, and  with an assumed right atrial pressure of 5 mmHg, the estimated right ventricular systolic pressure is 16.2 mmHg. Left Atrium: Left atrial size was normal in size. Right Atrium: Right atrial size was normal in size. Pericardium: There is no evidence of pericardial effusion. Mitral Valve: The mitral  valve is normal in structure. There is mild calcification of the mitral valve leaflet(s). Moderate mitral annular calcification. No evidence of mitral valve regurgitation. No evidence of mitral valve stenosis. MV peak gradient, 6.7 mmHg. The mean mitral valve gradient is 4.0 mmHg. Tricuspid Valve: The tricuspid valve is normal in structure. Tricuspid valve regurgitation is not demonstrated. No evidence of tricuspid stenosis. Aortic Valve: The aortic valve is normal in structure. Aortic valve regurgitation is not visualized. Aortic valve sclerosis/calcification is present, without any evidence of aortic stenosis. Aortic valve mean gradient measures 4.3 mmHg. Aortic valve peak  gradient measures 7.4 mmHg. Aortic valve area, by VTI measures 2.13 cm. Pulmonic Valve: The pulmonic valve was normal in structure. Pulmonic valve regurgitation is not visualized. No evidence of pulmonic stenosis. Aorta: The aortic root is normal in size and structure. Venous: The inferior vena cava is normal in size with greater than 50% respiratory variability, suggesting right atrial pressure of 3 mmHg. IAS/Shunts: No atrial level shunt detected by color flow Doppler. Additional Comments: 3D was performed not requiring image post processing on an independent workstation and was indeterminate.  LEFT VENTRICLE PLAX 2D LV EF:         Left            Diastology                ventricular     LV e' medial:    5.87 cm/s                ejection        LV E/e' medial:  18.9  fraction by     LV e' lateral:   6.42 cm/s                PLAX is 59      LV E/e' lateral: 17.3                %. LVIDd:         3.90 cm LVIDs:         2.70 cm LV PW:         1.20 cm LV IVS:        0.90 cm LVOT diam:     2.00 cm LV SV:         58 LV SV Index:   31 LVOT Area:     3.14 cm LV IVRT:       116 msec  RIGHT VENTRICLE RV Basal diam:  3.20 cm    PULMONARY VEINS RV Mid diam:    3.10 cm    Diastolic Velocity: 58.70 cm/s RV S prime:     9.90 cm/s  S/D  Velocity:       0.50 TAPSE (M-mode): 1.8 cm     Systolic Velocity:  30.40 cm/s LEFT ATRIUM           Index        RIGHT ATRIUM           Index LA diam:      3.70 cm 1.98 cm/m   RA Area:     18.70 cm LA Vol (A2C): 74.3 ml 39.81 ml/m  RA Volume:   49.70 ml  26.63 ml/m LA Vol (A4C): 44.0 ml 23.58 ml/m  AORTIC VALVE AV Area (Vmax):    2.01 cm AV Area (Vmean):   1.98 cm AV Area (VTI):     2.13 cm AV Vmax:           135.67 cm/s AV Vmean:          92.600 cm/s AV VTI:            0.274 m AV Peak Grad:      7.4 mmHg AV Mean Grad:      4.3 mmHg LVOT Vmax:         87.00 cm/s LVOT Vmean:        58.300 cm/s LVOT VTI:          0.186 m LVOT/AV VTI ratio: 0.68  AORTA Ao Root diam: 3.20 cm MITRAL VALVE                TRICUSPID VALVE MV Area (PHT): 3.77 cm     TR Peak grad:   11.2 mmHg MV Area VTI:   1.74 cm     TR Vmax:        167.00 cm/s MV Peak grad:  6.7 mmHg MV Mean grad:  4.0 mmHg     SHUNTS MV Vmax:       1.29 m/s     Systemic VTI:  0.19 m MV Vmean:      93.6 cm/s    Systemic Diam: 2.00 cm MV Decel Time: 201 msec MV E velocity: 111.00 cm/s MV A velocity: 121.00 cm/s MV E/A ratio:  0.92 Evalene Lunger MD Electronically signed by Evalene Lunger MD Signature Date/Time: 02/26/2024/2:57:18 PM    Final     Microbiology: Results for orders placed or performed during the hospital encounter of 02/24/24  Gastrointestinal Panel by PCR , Stool     Status: None   Collection Time:  02/26/24 12:50 AM   Specimen: Stool  Result Value Ref Range Status   Campylobacter species NOT DETECTED NOT DETECTED Final   Plesimonas shigelloides NOT DETECTED NOT DETECTED Final   Salmonella species NOT DETECTED NOT DETECTED Final   Yersinia enterocolitica NOT DETECTED NOT DETECTED Final   Vibrio species NOT DETECTED NOT DETECTED Final   Vibrio cholerae NOT DETECTED NOT DETECTED Final   Enteroaggregative E coli (EAEC) NOT DETECTED NOT DETECTED Final   Enteropathogenic E coli (EPEC) NOT DETECTED NOT DETECTED Final   Enterotoxigenic E  coli (ETEC) NOT DETECTED NOT DETECTED Final   Shiga like toxin producing E coli (STEC) NOT DETECTED NOT DETECTED Final   Shigella/Enteroinvasive E coli (EIEC) NOT DETECTED NOT DETECTED Final   Cryptosporidium NOT DETECTED NOT DETECTED Final   Cyclospora cayetanensis NOT DETECTED NOT DETECTED Final   Entamoeba histolytica NOT DETECTED NOT DETECTED Final   Giardia lamblia NOT DETECTED NOT DETECTED Final   Adenovirus F40/41 NOT DETECTED NOT DETECTED Final   Astrovirus NOT DETECTED NOT DETECTED Final   Norovirus GI/GII NOT DETECTED NOT DETECTED Final   Rotavirus A NOT DETECTED NOT DETECTED Final   Sapovirus (I, II, IV, and V) NOT DETECTED NOT DETECTED Final    Comment: Performed at [facility], [address] [place], [place] [address]    Labs: CBC: Recent Labs  Lab 02/24/24 1537 02/25/24 0540 02/26/24 0653 02/27/24 0605  WBC 5.8 4.6 4.5 5.7  NEUTROABS 2.8  --   --   --   HGB 11.5* 8.8* 8.5* 8.5*  HCT 33.2* 24.9* 24.1* 24.8*  MCV 106.1* 106.9* 108.1* 108.8*  PLT 310 203 192 214   Basic Metabolic Panel: Recent Labs  Lab 02/24/24 1537 02/25/24 0540 02/25/24 0931 02/25/24 1458 02/26/24 0653 02/27/24 0605  NA 143 144  --   --  143 139  K 2.4* 2.4*  --  3.1* 3.4* 4.0  CL 96* 102  --   --  105 103  CO2 35* 35*  --   --  32 30  GLUCOSE 163* 114*  --   --  112* 107*  BUN 6 5*  --   --  <5* <5*  CREATININE 0.69 0.59*  --   --  0.45* 0.48*  CALCIUM  7.1* 6.6*  --   --  7.4* 7.9*  MG 1.3* 1.8  --   --  1.7 1.9  PHOS  --   --  3.2  --  2.4* 4.0   Liver Function Tests: Recent Labs  Lab 02/24/24 1537 02/25/24 0540 02/26/24 0653 02/27/24 0605  AST 81* 62* 71* 101*  ALT 19 16 17 26   ALKPHOS 151* 115 107 113  BILITOT 2.6* 1.7* 1.5* 1.3*  PROT 6.3* 4.7* 4.5* 4.8*  ALBUMIN 3.3* 2.6* 2.6* 2.6*   CBG: Recent Labs  Lab 02/26/24 1137 02/26/24 1650 02/26/24 2136 02/27/24 0809 02/27/24 1150  GLUCAP 169* 98 147* 114* 184*    Discharge time spent: Time  Coordinating Discharge: I spent a total of 35 minutes engaged in face-to-face discussion with the patient and/or caregivers regarding the patient's care, assessment, plan, and discharge disposition. Over 50% of this time was dedicated to counseling the patient on the risks and benefits of treatment options and the discharge plan, as well as coordinating post-discharge care.   Signed: Dywane Peruski Al-Sultani, MD [facility] 02/27/2024

## 2024-02-27 NOTE — Progress Notes (Signed)
 All discharge requirements met.

## 2024-02-27 NOTE — Care Management Important Message (Signed)
 Important Message  Patient Details  Name: Christian [name] MRN: [id number] Date of Birth: [birth year]   Important Message Given:  Yes - Medicare IM     Fatuma Dowers W, CMA 02/27/2024, 12:41 PM

## 2024-02-27 NOTE — Plan of Care (Signed)
  Problem: Education: Goal: Ability to describe self-care measures that may prevent or decrease complications (Diabetes Survival Skills Education) will improve Outcome: Progressing   Problem: Coping: Goal: Ability to adjust to condition or change in health will improve Outcome: Progressing   Problem: Skin Integrity: Goal: Risk for impaired skin integrity will decrease Outcome: Progressing   Problem: Activity: Goal: Risk for activity intolerance will decrease Outcome: Progressing

## 2024-02-27 NOTE — Plan of Care (Signed)

## 2024-03-11 NOTE — Telephone Encounter (Signed)
 Voice mail box not set up.

## 2024-04-12 ENCOUNTER — Inpatient Hospital Stay: Attending: Family Medicine

## 2024-04-13 ENCOUNTER — Inpatient Hospital Stay

## 2024-04-20 NOTE — Telephone Encounter (Signed)
 Pt scheduled on 04/30/2024

## 2024-04-27 ENCOUNTER — Encounter: Payer: Self-pay | Admitting: Oncology

## 2024-04-29 ENCOUNTER — Encounter: Payer: Self-pay | Admitting: Oncology

## 2024-04-30 ENCOUNTER — Encounter: Payer: Self-pay | Admitting: Physician Assistant

## 2024-04-30 ENCOUNTER — Ambulatory Visit: Payer: Medicare (Managed Care) | Admitting: Physician Assistant

## 2024-04-30 VITALS — BP 140/80 | HR 68 | Ht 66.0 in | Wt 172.0 lb

## 2024-04-30 DIAGNOSIS — E878 Other disorders of electrolyte and fluid balance, not elsewhere classified: Secondary | ICD-10-CM

## 2024-04-30 DIAGNOSIS — E782 Mixed hyperlipidemia: Secondary | ICD-10-CM

## 2024-04-30 DIAGNOSIS — Z86718 Personal history of other venous thrombosis and embolism: Secondary | ICD-10-CM

## 2024-04-30 DIAGNOSIS — I2510 Atherosclerotic heart disease of native coronary artery without angina pectoris: Secondary | ICD-10-CM

## 2024-04-30 DIAGNOSIS — Z8673 Personal history of transient ischemic attack (TIA), and cerebral infarction without residual deficits: Secondary | ICD-10-CM

## 2024-04-30 DIAGNOSIS — R9431 Abnormal electrocardiogram [ECG] [EKG]: Secondary | ICD-10-CM

## 2024-04-30 DIAGNOSIS — R296 Repeated falls: Secondary | ICD-10-CM

## 2024-04-30 DIAGNOSIS — I10 Essential (primary) hypertension: Secondary | ICD-10-CM

## 2024-04-30 NOTE — Progress Notes (Signed)
 "  Cardiology Office Note    Date:  04/30/2024   ID:  MYLEZ VENABLE, DOB Mar 19, 1971, MRN [id number]  PCP:  Glover Lenis, MD  Cardiologist:  Evalene Lunger, MD  Electrophysiologist:  None   Chief Complaint: Follow up  History of Present Illness:   Christian [name] is a 54 y.o. male with history of coronary artery disease, hypertension, hyperlipidemia, type 2 diabetes, history of stroke, history of DVT, prior tobacco use, and obesity who presents for follow up on CAD.    Patient had NSTEMI 07/2013 underwent PCI/DES to the LCx.  That time showed preserved LV systolic function with EF 60 to 65%.  Lexiscan  MPI 12/2017 was without significant ischemia low risk study.  Echo 12/2017 with EF 60 to 65% and G1 DD.  Echo 02/2019 with EF 65 to 70%.  Carotid Dopplers 07/2019 showed minimal plaque in the bilateral carotid arteries with no significant stenosis.  Echo 07/2019 revealed EF 70 to 75% with moderate LVH.   Patient was most recently seen in the cardiology office 12/2022 with Dr. Gollan.  He was feeling well at that time.  Endorsed paroxysmal palpitations lasting 3 to 4 minutes once a month.  Lipid panel was ordered revealing LDL above goal.  Patient was called and reported he had run out of Lipitor  and refill was sent.  Patient recently admitted to [facility] 02/24/2024 after referral by his PCP for abnormal lab values in the setting of diarrhea and alcohol use.  Potassium 2.4 and magnesium  1.3 on admission.  QTc was prolonged at 523 on EKG.  Admission was also complicated by macrocytic anemia and folate deficiency which was felt to be likely alcohol induced.  He also was noted to have lower extremity swelling for which echocardiogram was obtained and showed EF 60 to 65% with no RWMA, G1 DD, normal RV function, and no significant valvular abnormalities.  Electrolytes were replenished and he was discharged in stable condition 02/27/2024.  Patient presents today overall doing well from a cardiac perspective.  He  reports over the last few weeks he has felt unsteady on his feet.  He does endorse at least 3 falls recently due to losing balance. He denies any preceding symptoms of lightheadedness and dizziness.  Did not lose consciousness or hit his head.  Of note, he has not been evaluated since hospital discharge at the beginning of December.  He endorses bilateral lower extremity swelling for the past 10 days which improves with compression stockings.  He reports not having much of an appetite and only eating about once per day.  He denies chest pain, shortness of breath, and palpitations.  Labs independently reviewed: 02/27/2024-Hgb 8.5, HCT 24.8, platelets 214, sodium 139, potassium 4.0, BUN less than 5, creatinine 0.48, AST 101, ALT 26 01/2024-TC 155, TG 158, HDL 43, LDL 80  Objective   Past Medical History:  Diagnosis Date   CAD (coronary artery disease)    a. 07/2013 NSTEMI/PCI: LCX 68m (4.0x23 Xience DES), RCA 24m, EF > 55%;  b. 07/2013 Echo: EF 60-65%, diast dysfxn, mild LVH, mildly dil LA, nl RV size/fxn, nl RVSP.   Diabetes mellitus without complication (HCC)    History of tobacco abuse    a. quit ~ 2010   Hyperlipidemia    Morbid obesity (HCC)    a. weighed 168 @ age 67.   Scoliosis    Stroke [facility])     Current Medications: Active Medications[1]  Allergies:   Patient has no known allergies.  Social History   Socioeconomic History   Marital status: Single    Spouse name: Not on file   Number of children: 2   Years of education: Not on file   Highest education level: Not on file  Occupational History    Comment: Disability  Tobacco Use   Smoking status: Former    Current packs/day: 0.00    Types: Cigarettes    Quit date: 2010    Years since quitting: 16.1   Smokeless tobacco: Never  Vaping Use   Vaping status: Never Used  Substance and Sexual Activity   Alcohol use: Not Currently    Comment: 3 drinks a day   Drug use: Not Currently    Types: Marijuana    Comment: past    Sexual activity: Not Currently  Other Topics Concern   Not on file  Social History Narrative   Not on file   Social Drivers of Health   Tobacco Use: Medium Risk (04/30/2024)   Patient History    Smoking Tobacco Use: Former    Smokeless Tobacco Use: Never    Passive Exposure: Not on file  Financial Resource Strain: Patient Declined (02/02/2023)   Received from [facility]   Overall Financial Resource Strain (CARDIA)    Difficulty of Paying Living Expenses: Patient declined  Food Insecurity: No Food Insecurity (02/25/2024)   Epic    Worried About Radiation Protection Practitioner of Food in the Last Year: Never true    Ran Out of Food in the Last Year: Never true  Transportation Needs: No Transportation Needs (02/25/2024)   Epic    Lack of Transportation (Medical): No    Lack of Transportation (Non-Medical): No  Physical Activity: Not on file  Stress: Not on file  Social Connections: Not on file  Depression (PHQ2-9): Low Risk (01/12/2024)   Depression (PHQ2-9)    PHQ-2 Score: 0  Alcohol Screen: Not on file  Housing: Low Risk (02/25/2024)   Epic    Unable to Pay for Housing in the Last Year: No    Number of Times Moved in the Last Year: 0    Homeless in the Last Year: No  Utilities: Not At Risk (02/25/2024)   Epic    Threatened with loss of utilities: No  Health Literacy: Not on file     Family History:  The patient's family history includes Diabetes in his sister; Esophageal cancer in his father; Heart disease in his mother.  ROS:   12-point review of systems is negative unless otherwise noted in the HPI.  EKGs/Other Studies Reviewed:    Studies reviewed were summarized above. The additional studies were reviewed today:  02/2024 2D echo 1. Left ventricular ejection fraction, by estimation, is 60 to 65%. Left  ventricular ejection fraction by PLAX is 59 %. The left ventricle has  normal function. The left ventricle has no regional wall motion  abnormalities. Left  ventricular diastolic  parameters are consistent with Grade I diastolic dysfunction (impaired  relaxation).   2. Right ventricular systolic function is normal. The right ventricular  size is normal. There is normal pulmonary artery systolic pressure. The  estimated right ventricular systolic pressure is 16.2 mmHg.   3. The mitral valve is normal in structure. No evidence of mitral valve  regurgitation. No evidence of mitral stenosis. The mean mitral valve  gradient is 4.0 mmHg. Moderate mitral annular calcification.   4. The aortic valve is normal in structure. Aortic valve regurgitation is  not visualized. Aortic valve  sclerosis/calcification is present, without  any evidence of aortic stenosis.   5. The inferior vena cava is normal in size with greater than 50%  respiratory variability, suggesting right atrial pressure of 3 mmHg.   EKG:  EKG personally reviewed by me today EKG Interpretation Date/Time:  Friday April 30 2024 14:46:02 EST Ventricular Rate:  68 PR Interval:  182 QRS Duration:  82 QT Interval:  424 QTC Calculation: 450 R Axis:   2  Text Interpretation: Normal sinus rhythm Low voltage QRS Possible Inferior infarct , age undetermined Confirmed by Lorene Sinclair ([id number]) on 04/30/2024 2:49:43 PM  PHYSICAL EXAM:    VS:  BP (!) 140/80   Pulse 68   Ht 5' 6 (1.676 m)   Wt 172 lb (78 kg)   SpO2 98%   BMI 27.76 kg/m   BMI: Body mass index is 27.76 kg/m.  GEN: Well nourished, well developed in no acute distress NECK: No JVD; No carotid bruits CARDIAC: RRR, no murmurs, rubs, gallops RESPIRATORY:  Clear to auscultation without rales, wheezing or rhonchi  ABDOMEN: Soft, non-tender, non-distended EXTREMITIES: 1-2+ bilateral LE edema; No deformity  Wt Readings from Last 3 Encounters:  04/30/24 172 lb (78 kg)  02/24/24 170 lb (77.1 kg)  01/12/24 162 lb 4.8 oz (73.6 kg)                  ASSESSMENT & PLAN:   Coronary artery disease - No recent chest pain.  No  ischemic evaluation indicated at this time.  Continue atorvastatin  80 mg daily, carvedilol  25 mg twice daily, and Eliquis  in place of aspirin .  Hypertension - BP mildly elevated today. He has not been taking lisinopril . Continue to monitor.   Hyperlipidemia - Most recent lipid panel 01/2024 with LDL 80. Continue atorvastatin  80 mg daily. Consider addition of ezetimibe  at follow up.   Electrolytes derangements Prolonged QTc - Discharged from the hospital 02/26/2025 after being admitted for electrolyte abnormalities in the setting of diarrhea.  QTc was noted to be prolonged at 523 on admission.  QTc improved to 450 on EKG today.  Recommend updating CBC, BMP, magnesium , and albumin.  Frequent falls - Patient endorses at least 3 falls in the past couple of weeks due to instability on his feet.  No loss of consciousness and has not hit his head. Check labs as above.  Encouraged patient to make an appointment with his PCP for further evaluation.  History of stroke - Continue statin and Eliquis  in place of aspirin .   History of DVT - On Eliquis  2.5 mg twice daily. Denies issues with bleeding.     Disposition: Update CBC, BMP, magnesium , and albumin.  Patient was instructed to arrange follow-up with PCP.  F/u with Dr. Gollan or an APP in 6 months.   Medication Adjustments/Labs and Tests Ordered: Current medicines are reviewed at length with the patient today.  Concerns regarding medicines are outlined above. Medication changes, Labs and Tests ordered today are summarized above and listed in the Patient Instructions accessible in Encounters.   Bonney Sinclair Lorene, PA-C 04/30/2024 4:04 PM     [facility] [place] [address] [place], [place] [address] [phone]      [1]  Current Meds  Medication Sig   apixaban  (ELIQUIS ) 2.5 MG TABS tablet TAKE 1 TABLET BY MOUTH 2 TIMES DAILY.   atorvastatin  (LIPITOR ) 80 MG tablet Take 1 tablet (80 mg total) by mouth  daily.   calcium  citrate (CALCITRATE -  DOSED IN MG ELEMENTAL CALCIUM ) 950 (200 Ca) MG tablet Take 1 tablet (950 mg total) by mouth daily.   carvedilol  (COREG ) 25 MG tablet TAKE 1 TABLET (25 MG TOTAL) BY MOUTH TWICE A DAY WITH MEALS   cholecalciferol  (CHOLECALCIFEROL ) 25 MCG tablet Take 1 tablet (1,000 Units total) by mouth daily.   feeding supplement (ENSURE PLUS HIGH PROTEIN) LIQD Take 237 mLs by mouth 3 (three) times daily between meals.   folic acid  (FOLVITE ) 1 MG tablet Take 1 tablet (1 mg total) by mouth daily.   glimepiride  (AMARYL ) 2 MG tablet Take 2 mg by mouth 2 (two) times daily.   Lancets (UNISTIK 2 NORMAL) MISC 1 each by Other route.   [Paused] lisinopril  (ZESTRIL ) 20 MG tablet Take 1 tablet (20 mg total) by mouth daily.   Multiple Vitamin (MULTIVITAMIN WITH MINERALS) TABS tablet Take 1 tablet by mouth daily.   PRECISION QID TEST test strip 1 strip.   thiamine  (VITAMIN B-1) 100 MG tablet Take 1 tablet (100 mg total) by mouth daily.   "

## 2024-04-30 NOTE — Patient Instructions (Addendum)
 Medication Instructions:  Your physician recommends that you continue on your current medications as directed. Please refer to the Current Medication list given to you today.  *If you need a refill on your cardiac medications before your next appointment, please call your pharmacy*  Lab Work: Your provider would like for you to have following labs drawn today [facility]   If you have labs (blood work) drawn today and your tests are completely normal, you will receive your results only by: MyChart Message (if you have MyChart) OR A paper copy in the mail If you have any lab test that is abnormal or we need to change your treatment, we will call you to review the results.  Testing/Procedures: No test ordered today   Follow-Up: At [facility], you and your health needs are our priority.  As part of our continuing mission to provide you with exceptional heart care, our providers are all part of one team.  This team includes your primary Cardiologist (physician) and Advanced Practice Providers or APPs (Physician Assistants and Nurse Practitioners) who all work together to provide you with the care you need, when you need it.  Your next appointment:   6 month(s)  Provider:   You may see Timothy Gollan, MD or one of the following Advanced Practice Providers on your designated Care Team:   Lonni Meager, NP Lesley Maffucci, PA-C Bernardino Bring, PA-C Cadence Garrett, PA-C Tylene Lunch, NP Barnie Hila, NP    We recommend signing up for the patient portal called MyChart.  Sign up information is provided on this After Visit Summary.  MyChart is used to connect with patients for Virtual Visits (Telemedicine).  Patients are able to view lab/test results, encounter notes, upcoming appointments, etc.  Non-urgent messages can be sent to your provider as well.   To learn more about what you can do with MyChart, go to [email or web address].   Other Instructions   Please  follow up with your primary care doctor.

## 2024-07-13 ENCOUNTER — Inpatient Hospital Stay: Admitting: Oncology

## 2024-07-13 ENCOUNTER — Inpatient Hospital Stay

## 2024-07-14 ENCOUNTER — Inpatient Hospital Stay

## 2024-07-14 ENCOUNTER — Inpatient Hospital Stay: Admitting: Oncology
